# Patient Record
Sex: Female | Born: 1970 | Race: White | Hispanic: No | Marital: Married | Smoking: Never smoker
Health system: Southern US, Community
[De-identification: ages and names within clinical notes are randomized; demographics above are authoritative.]

## PROBLEM LIST (undated history)

## (undated) ENCOUNTER — Emergency Department (HOSPITAL_COMMUNITY): Admission: EM | Discharge status: Absent without leave | Payer: BC Managed Care – PPO

## (undated) DIAGNOSIS — M1990 Unspecified osteoarthritis, unspecified site: Secondary | ICD-10-CM

## (undated) DIAGNOSIS — C50919 Malignant neoplasm of unspecified site of unspecified female breast: Secondary | ICD-10-CM

## (undated) DIAGNOSIS — T4145XA Adverse effect of unspecified anesthetic, initial encounter: Secondary | ICD-10-CM

## (undated) DIAGNOSIS — T8859XA Other complications of anesthesia, initial encounter: Secondary | ICD-10-CM

## (undated) DIAGNOSIS — R000 Tachycardia, unspecified: Secondary | ICD-10-CM

## (undated) DIAGNOSIS — Z862 Personal history of diseases of the blood and blood-forming organs and certain disorders involving the immune mechanism: Secondary | ICD-10-CM

## (undated) DIAGNOSIS — Z923 Personal history of irradiation: Secondary | ICD-10-CM

## (undated) DIAGNOSIS — R159 Full incontinence of feces: Secondary | ICD-10-CM

## (undated) DIAGNOSIS — E039 Hypothyroidism, unspecified: Secondary | ICD-10-CM

## (undated) DIAGNOSIS — M069 Rheumatoid arthritis, unspecified: Secondary | ICD-10-CM

## (undated) DIAGNOSIS — Z9889 Other specified postprocedural states: Secondary | ICD-10-CM

## (undated) DIAGNOSIS — N993 Prolapse of vaginal vault after hysterectomy: Secondary | ICD-10-CM

## (undated) DIAGNOSIS — Z9221 Personal history of antineoplastic chemotherapy: Secondary | ICD-10-CM

## (undated) DIAGNOSIS — E890 Postprocedural hypothyroidism: Secondary | ICD-10-CM

## (undated) DIAGNOSIS — Z8711 Personal history of peptic ulcer disease: Secondary | ICD-10-CM

## (undated) DIAGNOSIS — I499 Cardiac arrhythmia, unspecified: Secondary | ICD-10-CM

## (undated) DIAGNOSIS — M2550 Pain in unspecified joint: Secondary | ICD-10-CM

## (undated) HISTORY — DX: Malignant neoplasm of unspecified site of unspecified female breast: C50.919

## (undated) HISTORY — PX: OTHER SURGICAL HISTORY: SHX169

## (undated) HISTORY — PX: ABDOMINAL HYSTERECTOMY: SHX81

## (undated) HISTORY — PX: OVUM / OOCYTE RETRIEVAL: SUR1269

---

## 1997-04-13 HISTORY — PX: EXCISION OF BREAST BIOPSY: SHX5822

## 1997-04-13 HISTORY — PX: OTHER SURGICAL HISTORY: SHX169

## 2006-04-13 HISTORY — PX: OTHER SURGICAL HISTORY: SHX169

## 2006-04-13 HISTORY — PX: APPENDECTOMY: SHX54

## 2006-08-12 HISTORY — PX: APPENDECTOMY: SHX54

## 2010-04-13 HISTORY — PX: THYROIDECTOMY, PARTIAL: SHX18

## 2010-05-14 HISTORY — PX: THYROID LOBECTOMY: SHX420

## 2011-06-03 HISTORY — PX: DILATION AND CURETTAGE OF UTERUS: SHX78

## 2012-04-15 DIAGNOSIS — M722 Plantar fascial fibromatosis: Secondary | ICD-10-CM | POA: Insufficient documentation

## 2015-04-02 DIAGNOSIS — J309 Allergic rhinitis, unspecified: Secondary | ICD-10-CM | POA: Insufficient documentation

## 2016-01-07 DIAGNOSIS — E782 Mixed hyperlipidemia: Secondary | ICD-10-CM | POA: Insufficient documentation

## 2016-01-07 DIAGNOSIS — E039 Hypothyroidism, unspecified: Secondary | ICD-10-CM | POA: Insufficient documentation

## 2016-06-12 ENCOUNTER — Encounter (INDEPENDENT_AMBULATORY_CARE_PROVIDER_SITE_OTHER): Payer: Self-pay

## 2016-06-12 ENCOUNTER — Encounter (INDEPENDENT_AMBULATORY_CARE_PROVIDER_SITE_OTHER): Payer: Self-pay | Admitting: *Deleted

## 2016-06-12 ENCOUNTER — Telehealth (INDEPENDENT_AMBULATORY_CARE_PROVIDER_SITE_OTHER): Payer: Self-pay | Admitting: *Deleted

## 2016-06-12 ENCOUNTER — Other Ambulatory Visit (INDEPENDENT_AMBULATORY_CARE_PROVIDER_SITE_OTHER): Payer: Self-pay | Admitting: *Deleted

## 2016-06-12 DIAGNOSIS — R194 Change in bowel habit: Secondary | ICD-10-CM | POA: Insufficient documentation

## 2016-06-12 MED ORDER — PEG 3350-KCL-NA BICARB-NACL 420 G PO SOLR: 4000.0000 mL | Freq: Once | ORAL | 0 refills | Status: DC

## 2016-06-12 NOTE — Telephone Encounter (Signed)
Referring MD/PCP: daniel   Procedure: tcs  Reason/Indication:  Change in stool  Has patient had this procedure before?  no  If so, when, by whom and where?    Is there a family history of colon cancer?  no  Who?  What age when diagnosed?    Is patient diabetic?   no      Does patient have prosthetic heart valve or mechanical valve?  no  Do you have a pacemaker?  no  Has patient ever had endocarditis? no  Has patient had joint replacement within last 12 months?  no  Does patient tend to be constipated or take laxatives? yes  Does patient have a history of alcohol/drug use?  no  Is patient on Coumadin, Plavix and/or Aspirin? no  Medications: zyrtec 10 mg daily, bystolic 5 mg daily, synthroid 50 mcg daily, mobic 15 mg daily  Allergies: codeine, hydrocodone  Medication Adjustment per Dr Laural Golden:   Procedure date & time: 06/15/16

## 2016-06-12 NOTE — Telephone Encounter (Signed)
Error   This encounter was created in error - please disregard. 

## 2016-06-12 NOTE — Telephone Encounter (Signed)
Open in error

## 2016-06-15 ENCOUNTER — Ambulatory Visit (HOSPITAL_COMMUNITY)
Admission: RE | Admit: 2016-06-15 | Discharge: 2016-06-15 | Disposition: A | Discharge status: Home / Self Care | Payer: BLUE CROSS/BLUE SHIELD | Source: Ambulatory Visit | Attending: Internal Medicine | Admitting: Internal Medicine

## 2016-06-15 ENCOUNTER — Encounter (HOSPITAL_COMMUNITY)
Admission: RE | Disposition: A | Discharge status: Home / Self Care | Payer: Self-pay | Source: Ambulatory Visit | Attending: Internal Medicine

## 2016-06-15 ENCOUNTER — Encounter (HOSPITAL_COMMUNITY): Payer: Self-pay | Admitting: *Deleted

## 2016-06-15 DIAGNOSIS — R194 Change in bowel habit: Secondary | ICD-10-CM | POA: Diagnosis not present

## 2016-06-15 DIAGNOSIS — K644 Residual hemorrhoidal skin tags: Secondary | ICD-10-CM | POA: Insufficient documentation

## 2016-06-15 DIAGNOSIS — Z79899 Other long term (current) drug therapy: Secondary | ICD-10-CM | POA: Insufficient documentation

## 2016-06-15 DIAGNOSIS — E039 Hypothyroidism, unspecified: Secondary | ICD-10-CM | POA: Insufficient documentation

## 2016-06-15 DIAGNOSIS — M1990 Unspecified osteoarthritis, unspecified site: Secondary | ICD-10-CM | POA: Diagnosis not present

## 2016-06-15 HISTORY — PX: COLONOSCOPY: SHX5424

## 2016-06-15 HISTORY — DX: Other complications of anesthesia, initial encounter: T88.59XA

## 2016-06-15 HISTORY — DX: Unspecified osteoarthritis, unspecified site: M19.90

## 2016-06-15 HISTORY — DX: Hypothyroidism, unspecified: E03.9

## 2016-06-15 HISTORY — DX: Cardiac arrhythmia, unspecified: I49.9

## 2016-06-15 HISTORY — DX: Adverse effect of unspecified anesthetic, initial encounter: T41.45XA

## 2016-06-15 SURGERY — COLONOSCOPY
Anesthesia: Moderate Sedation

## 2016-06-15 MED ORDER — MEPERIDINE HCL 50 MG/ML IJ SOLN
INTRAMUSCULAR | Status: AC
  Filled 2016-06-15: qty 1

## 2016-06-15 MED ORDER — MEPERIDINE HCL 50 MG/ML IJ SOLN
INTRAMUSCULAR | Status: DC | PRN
Start: 2016-06-15 — End: 2016-06-15
  Administered 2016-06-15 (×2): 25 mg via INTRAVENOUS

## 2016-06-15 MED ORDER — STERILE WATER FOR IRRIGATION IR SOLN
Status: DC | PRN
  Administered 2016-06-15: 09:00:00

## 2016-06-15 MED ORDER — SODIUM CHLORIDE 0.9 % IV SOLN
INTRAVENOUS | Status: DC
  Administered 2016-06-15: 08:00:00 via INTRAVENOUS

## 2016-06-15 MED ORDER — MIDAZOLAM HCL 5 MG/5ML IJ SOLN
INTRAMUSCULAR | Status: DC | PRN
  Administered 2016-06-15: 3 mg via INTRAVENOUS
  Administered 2016-06-15 (×2): 2 mg via INTRAVENOUS

## 2016-06-15 MED ORDER — MIDAZOLAM HCL 5 MG/5ML IJ SOLN
INTRAMUSCULAR | Status: AC
  Filled 2016-06-15: qty 10

## 2016-06-15 NOTE — Op Note (Signed)
[facility] Patient Name: Sandy Goodwin Procedure Date: 06/15/2016 8:13 AM MRN: [id number] Date of Birth: 1970/08/23 Attending MD: Hildred Laser , MD CSN: [id number] Age: 46 Admit Type: Outpatient Procedure:                Colonoscopy Indications:              Change in bowel habits Providers:                Hildred Laser, MD, Hinton Rao, RN, Aram Candela Referring MD:             Gar Ponto, MD Medicines:                Meperidine 50 mg IV, Midazolam 7 mg IV Complications:            No immediate complications. Estimated Blood Loss:     Estimated blood loss: none. Procedure:                Pre-Anesthesia Assessment:                           - Prior to the procedure, a History and Physical                            was performed, and patient medications and                            allergies were reviewed. The patient's tolerance of                            previous anesthesia was also reviewed. The risks                            and benefits of the procedure and the sedation                            options and risks were discussed with the patient.                            All questions were answered, and informed consent                            was obtained. Prior Anticoagulants: The patient                            last took previous NSAID medication 1 day prior to                            the procedure. ASA Grade Assessment: II - A patient                            with mild systemic disease. After reviewing the                            risks and benefits, the patient was deemed in  satisfactory condition to undergo the procedure.                           After obtaining informed consent, the colonoscope                            was passed under direct vision. Throughout the                            procedure, the patient's blood pressure, pulse, and                            oxygen saturations were monitored continuously.  The                            EC-[id number]) scope was introduced through                            the anus and advanced to the the cecum, identified                            by appendiceal orifice and ileocecal valve. The                            colonoscopy was performed without difficulty. The                            patient tolerated the procedure well. The quality                            of the bowel preparation was excellent. The                            ileocecal valve, appendiceal orifice, and rectum                            were photographed. Scope In: 8:49:17 AM Scope Out: 9:02:24 AM Scope Withdrawal Time: 0 hours 5 minutes 52 seconds  Total Procedure Duration: 0 hours 13 minutes 7 seconds  Findings:      The perianal and digital rectal examinations were normal.      The colon (entire examined portion) appeared normal.      External hemorrhoids were found during retroflexion. The hemorrhoids       were small. Impression:               - The entire examined colon is normal.                           - External hemorrhoids.                           - No specimens collected. Moderate Sedation:      Moderate (conscious) sedation was administered by the endoscopy nurse       and supervised by the endoscopist. The following parameters were       monitored:  oxygen saturation, heart rate, blood pressure, CO2       capnography and response to care. Total physician intraservice time was       17 minutes. Recommendation:           - Patient has a contact number available for                            emergencies. The signs and symptoms of potential                            delayed complications were discussed with the                            patient. Return to normal activities tomorrow.                            Written discharge instructions were provided to the                            patient.                           - High fiber diet today.                            - Continue present medications.                           - Repeat colonoscopy in 10 years for screening                            purposes.                           - Use original regular Metamucil one teaspoon PO                            daily.                           - CBC, serumalcium and TSH [facility]. Procedure Code(s):        --- Professional ---                           [phone], Colonoscopy, flexible; diagnostic, including                            collection of specimen(s) by brushing or washing,                            when performed (separate procedure)                           99152, Moderate sedation services  provided by the                            same physician or other qualified health care                            professional performing the diagnostic or                            therapeutic service that the sedation supports,                            requiring the presence of an independent trained                            observer to assist in the monitoring of the                            patient's level of consciousness and physiological                            status; initial 15 minutes of intraservice time,                            patient age 20 years or older Diagnosis Code(s):        --- Professional ---                           K64.4, Residual hemorrhoidal skin tags                           R19.4, Change in bowel habit CPT copyright 2016 American [employer] Association. All rights reserved. The codes documented in this report are preliminary and upon coder review may  be revised to meet current compliance requirements. Hildred Laser, MD Hildred Laser, MD 06/15/2016 9:17:33 AM This report has been signed electronically. Number of Addenda: 0

## 2016-06-15 NOTE — Discharge Instructions (Signed)
Resume usual medications and high fiber diet. Metamucil 3-4 g by mouth daily at bedtime. Please get CBC serum electrolytes and TSH checked. No driving for 24 hours. Next colonoscopy in 10 years.   Colonoscopy, Adult, Care After This sheet gives you information about how to care for yourself after your procedure. Your health care provider may also give you more specific instructions. If you have problems or questions, contact your health care provider. Dr Laural GoldenMU:[phone].  (after hours call hospital and have GI doctor on call paged) What can I expect after the procedure? After the procedure, it is common to have:  A small amount of blood in your stool for 24 hours after the procedure.  Some gas.  Mild abdominal cramping or bloating. Follow these instructions at home: General instructions    For the first 24 hours after the procedure:  Do not drive or use machinery.  Do not sign important documents.  Do not drink alcohol.  Do your regular daily activities at a slower pace than normal.  Eat soft, easy-to-digest foods.  Rest often.  Take over-the-counter or prescription medicines only as told by your health care provider.  It is up to you to get the results of your procedure. Ask your health care provider, or the department performing the procedure, when your results will be ready. Relieving cramping and bloating   Try walking around when you have cramps or feel bloated. Eating and drinking   Drink enough fluid to keep your urine clear or pale yellow.  Resume your normal diet as instructed by your health care provider. Avoid heavy or fried foods that are hard to digest.  Avoid drinking alcohol for as long as instructed by your health care provider. Contact a health care provider if:  You have blood in your stool 2-3 days after the procedure. Get help right away if:  You have more than a small spotting of blood in your stool.  You pass large blood clots in your  stool.  Your abdomen is swollen.  You have nausea or vomiting.  You have a fever.  You have increasing abdominal pain that is not relieved with medicine. This information is not intended to replace advice given to you by your health care provider. Make sure you discuss any questions you have with your health care provider. Document Released: 11/12/2003 Document Revised: 12/23/2015 Document Reviewed: 06/11/2015 Elsevier Interactive Patient Education  2017 [employer].

## 2016-06-15 NOTE — H&P (Signed)
Sandy Goodwin is an 46 y.o. female.   Chief Complaint:  Patient is here for colonoscopy. HPI:  Patient is 46 year old Sandy Goodwin female who persents with 2 month history of change in her bowel habits. She has noted her stools to be like ribbon. Her bowels would not move that she takes MiraLAX. She denies abdominal pain melena or rectal bleeding. She has not changed her eating habits. She eats a lot of fiber foods. She has not lost any weight. She had normal TSH in September 2017. Family history is negative for CRC or IBD.  Past Medical History:  Diagnosis Date  . Arthritis   . Complication of anesthesia    Itching  . Dysrhythmia    Tachycardia  . Hypothyroidism     Past Surgical History:  Procedure Laterality Date  . APPENDECTOMY  2008  . Bowel Rupture Repair  2008   Ruptured during appendectomy  . Breast Biopsy Right 1999  . IBF  [id number]  . THYROIDECTOMY, PARTIAL Bilateral 2012    Family History  Problem Relation Age of Onset  . Hypertension Mother   . Breast cancer Mother   . Lymphoma Mother   . Hypertension Brother    Social History:  reports that she has never smoked. She has never used smokeless tobacco. She reports that she does not drink alcohol or use drugs.  Allergies:  Allergies  Allergen Reactions  . Codeine Other (See Comments)    Makes pt agittated and stay awake   . Hydrocodone Other (See Comments)    Makes pt agittated and stay awake     Medications Prior to Admission  Medication Sig Dispense Refill  . BYSTOLIC 5 MG tablet Take 1 tablet by mouth daily.  1  . cetirizine (ZYRTEC) 10 MG tablet Take 10 mg by mouth daily.    Marland Kitchen levothyroxine (SYNTHROID, LEVOTHROID) 50 MCG tablet Take 50 mcg by mouth daily before breakfast.    . meloxicam (MOBIC) 15 MG tablet Take 15 mg by mouth daily.      No results found for this or any previous visit (from the past 48 hour(s)). No results found.  ROS  Blood pressure 113/77, pulse 63, temperature 98.2 F (36.8 C),  temperature source Oral, resp. rate 20, height 5' 6.5" (1.689 m), weight 185 lb (83.9 kg), SpO2 100 %. Physical Exam  Constitutional: She appears well-developed and well-nourished.  HENT:  Mouth/Throat: Oropharynx is clear and moist.  Eyes: Conjunctivae are normal. No scleral icterus.  Neck: No thyromegaly present.  Cardiovascular: Normal rate, regular rhythm and normal heart sounds.   No murmur heard. Respiratory: Effort normal and breath sounds normal.  GI: Soft. She exhibits no distension and no mass. There is no tenderness.  Musculoskeletal: She exhibits no edema.  Lymphadenopathy:    She has no cervical adenopathy.  Neurological: She is alert.  Skin: Skin is warm and dry.     Assessment/Plan Change in bowel habits. Diagnostic colonoscopy.  Hildred Laser, MD 06/15/2016, 8:37 AM

## 2016-06-17 ENCOUNTER — Encounter (HOSPITAL_COMMUNITY): Payer: Self-pay | Admitting: Internal Medicine

## 2016-06-26 DIAGNOSIS — M1280 Other specific arthropathies, not elsewhere classified, unspecified site: Secondary | ICD-10-CM | POA: Diagnosis not present

## 2016-06-26 DIAGNOSIS — Z79899 Other long term (current) drug therapy: Secondary | ICD-10-CM | POA: Diagnosis not present

## 2016-06-26 DIAGNOSIS — E039 Hypothyroidism, unspecified: Secondary | ICD-10-CM | POA: Diagnosis not present

## 2017-01-05 DIAGNOSIS — E782 Mixed hyperlipidemia: Secondary | ICD-10-CM | POA: Diagnosis not present

## 2017-01-05 DIAGNOSIS — Z79899 Other long term (current) drug therapy: Secondary | ICD-10-CM | POA: Diagnosis not present

## 2017-01-05 DIAGNOSIS — E039 Hypothyroidism, unspecified: Secondary | ICD-10-CM | POA: Diagnosis not present

## 2017-01-05 DIAGNOSIS — Z0000 Encounter for general adult medical examination without abnormal findings: Secondary | ICD-10-CM | POA: Diagnosis not present

## 2017-01-05 DIAGNOSIS — R5383 Other fatigue: Secondary | ICD-10-CM | POA: Diagnosis not present

## 2017-01-07 DIAGNOSIS — Z23 Encounter for immunization: Secondary | ICD-10-CM | POA: Diagnosis not present

## 2017-01-07 DIAGNOSIS — L814 Other melanin hyperpigmentation: Secondary | ICD-10-CM | POA: Diagnosis not present

## 2017-01-07 DIAGNOSIS — L853 Xerosis cutis: Secondary | ICD-10-CM | POA: Diagnosis not present

## 2017-01-07 DIAGNOSIS — D225 Melanocytic nevi of trunk: Secondary | ICD-10-CM | POA: Diagnosis not present

## 2017-01-07 DIAGNOSIS — L218 Other seborrheic dermatitis: Secondary | ICD-10-CM | POA: Diagnosis not present

## 2017-01-25 DIAGNOSIS — Z01419 Encounter for gynecological examination (general) (routine) without abnormal findings: Secondary | ICD-10-CM | POA: Diagnosis not present

## 2017-01-25 DIAGNOSIS — Z6831 Body mass index (BMI) 31.0-31.9, adult: Secondary | ICD-10-CM | POA: Diagnosis not present

## 2017-02-19 DIAGNOSIS — H5213 Myopia, bilateral: Secondary | ICD-10-CM | POA: Diagnosis not present

## 2017-03-15 DIAGNOSIS — M25551 Pain in right hip: Secondary | ICD-10-CM | POA: Diagnosis not present

## 2017-03-15 DIAGNOSIS — Z79899 Other long term (current) drug therapy: Secondary | ICD-10-CM | POA: Diagnosis not present

## 2017-03-15 DIAGNOSIS — M2550 Pain in unspecified joint: Secondary | ICD-10-CM | POA: Diagnosis not present

## 2017-03-15 DIAGNOSIS — R768 Other specified abnormal immunological findings in serum: Secondary | ICD-10-CM | POA: Diagnosis not present

## 2017-05-03 DIAGNOSIS — Z1231 Encounter for screening mammogram for malignant neoplasm of breast: Secondary | ICD-10-CM | POA: Diagnosis not present

## 2017-11-23 DIAGNOSIS — R300 Dysuria: Secondary | ICD-10-CM | POA: Diagnosis not present

## 2018-01-08 DIAGNOSIS — Z0000 Encounter for general adult medical examination without abnormal findings: Secondary | ICD-10-CM | POA: Diagnosis not present

## 2018-01-08 DIAGNOSIS — Z23 Encounter for immunization: Secondary | ICD-10-CM | POA: Diagnosis not present

## 2018-01-08 DIAGNOSIS — E782 Mixed hyperlipidemia: Secondary | ICD-10-CM | POA: Diagnosis not present

## 2018-01-08 DIAGNOSIS — E039 Hypothyroidism, unspecified: Secondary | ICD-10-CM | POA: Diagnosis not present

## 2018-02-28 DIAGNOSIS — H5213 Myopia, bilateral: Secondary | ICD-10-CM | POA: Diagnosis not present

## 2018-03-03 DIAGNOSIS — Z6831 Body mass index (BMI) 31.0-31.9, adult: Secondary | ICD-10-CM | POA: Diagnosis not present

## 2018-03-03 DIAGNOSIS — Z01419 Encounter for gynecological examination (general) (routine) without abnormal findings: Secondary | ICD-10-CM | POA: Diagnosis not present

## 2018-03-17 DIAGNOSIS — R768 Other specified abnormal immunological findings in serum: Secondary | ICD-10-CM | POA: Diagnosis not present

## 2018-03-17 DIAGNOSIS — M25551 Pain in right hip: Secondary | ICD-10-CM | POA: Diagnosis not present

## 2018-03-17 DIAGNOSIS — M2550 Pain in unspecified joint: Secondary | ICD-10-CM | POA: Diagnosis not present

## 2018-03-17 DIAGNOSIS — Z79899 Other long term (current) drug therapy: Secondary | ICD-10-CM | POA: Diagnosis not present

## 2018-03-23 DIAGNOSIS — Z8041 Family history of malignant neoplasm of ovary: Secondary | ICD-10-CM | POA: Diagnosis not present

## 2018-03-23 DIAGNOSIS — Z803 Family history of malignant neoplasm of breast: Secondary | ICD-10-CM | POA: Diagnosis not present

## 2018-04-20 DIAGNOSIS — Z1239 Encounter for other screening for malignant neoplasm of breast: Secondary | ICD-10-CM | POA: Diagnosis not present

## 2018-05-02 DIAGNOSIS — D225 Melanocytic nevi of trunk: Secondary | ICD-10-CM | POA: Diagnosis not present

## 2018-05-02 DIAGNOSIS — L853 Xerosis cutis: Secondary | ICD-10-CM | POA: Diagnosis not present

## 2018-05-02 DIAGNOSIS — D1801 Hemangioma of skin and subcutaneous tissue: Secondary | ICD-10-CM | POA: Diagnosis not present

## 2018-05-02 DIAGNOSIS — Z872 Personal history of diseases of the skin and subcutaneous tissue: Secondary | ICD-10-CM | POA: Diagnosis not present

## 2018-05-05 DIAGNOSIS — Z1231 Encounter for screening mammogram for malignant neoplasm of breast: Secondary | ICD-10-CM | POA: Diagnosis not present

## 2018-09-29 DIAGNOSIS — L280 Lichen simplex chronicus: Secondary | ICD-10-CM | POA: Diagnosis not present

## 2018-09-29 DIAGNOSIS — Z6831 Body mass index (BMI) 31.0-31.9, adult: Secondary | ICD-10-CM | POA: Diagnosis not present

## 2018-12-22 DIAGNOSIS — Z23 Encounter for immunization: Secondary | ICD-10-CM | POA: Diagnosis not present

## 2019-01-11 DIAGNOSIS — E039 Hypothyroidism, unspecified: Secondary | ICD-10-CM | POA: Diagnosis not present

## 2019-01-11 DIAGNOSIS — E782 Mixed hyperlipidemia: Secondary | ICD-10-CM | POA: Diagnosis not present

## 2019-01-11 DIAGNOSIS — R739 Hyperglycemia, unspecified: Secondary | ICD-10-CM | POA: Diagnosis not present

## 2019-01-11 DIAGNOSIS — R5383 Other fatigue: Secondary | ICD-10-CM | POA: Diagnosis not present

## 2019-02-02 DIAGNOSIS — N814 Uterovaginal prolapse, unspecified: Secondary | ICD-10-CM | POA: Diagnosis not present

## 2019-02-02 DIAGNOSIS — Z01419 Encounter for gynecological examination (general) (routine) without abnormal findings: Secondary | ICD-10-CM | POA: Diagnosis not present

## 2019-03-17 DIAGNOSIS — Z79899 Other long term (current) drug therapy: Secondary | ICD-10-CM | POA: Diagnosis not present

## 2019-03-17 DIAGNOSIS — E039 Hypothyroidism, unspecified: Secondary | ICD-10-CM | POA: Diagnosis not present

## 2019-03-17 DIAGNOSIS — N814 Uterovaginal prolapse, unspecified: Secondary | ICD-10-CM | POA: Diagnosis not present

## 2019-03-17 DIAGNOSIS — Z975 Presence of (intrauterine) contraceptive device: Secondary | ICD-10-CM | POA: Diagnosis not present

## 2019-03-17 DIAGNOSIS — E669 Obesity, unspecified: Secondary | ICD-10-CM | POA: Diagnosis not present

## 2019-03-17 DIAGNOSIS — Z01818 Encounter for other preprocedural examination: Secondary | ICD-10-CM | POA: Diagnosis not present

## 2019-03-17 DIAGNOSIS — Z886 Allergy status to analgesic agent status: Secondary | ICD-10-CM | POA: Diagnosis not present

## 2019-03-17 DIAGNOSIS — D259 Leiomyoma of uterus, unspecified: Secondary | ICD-10-CM | POA: Diagnosis not present

## 2019-03-17 DIAGNOSIS — G609 Hereditary and idiopathic neuropathy, unspecified: Secondary | ICD-10-CM | POA: Diagnosis not present

## 2019-03-17 DIAGNOSIS — R102 Pelvic and perineal pain: Secondary | ICD-10-CM | POA: Diagnosis not present

## 2019-03-17 DIAGNOSIS — Z6830 Body mass index (BMI) 30.0-30.9, adult: Secondary | ICD-10-CM | POA: Diagnosis not present

## 2019-03-21 DIAGNOSIS — N814 Uterovaginal prolapse, unspecified: Secondary | ICD-10-CM | POA: Diagnosis not present

## 2019-03-21 DIAGNOSIS — D259 Leiomyoma of uterus, unspecified: Secondary | ICD-10-CM | POA: Diagnosis not present

## 2019-03-21 DIAGNOSIS — Z975 Presence of (intrauterine) contraceptive device: Secondary | ICD-10-CM | POA: Diagnosis not present

## 2019-03-21 DIAGNOSIS — G609 Hereditary and idiopathic neuropathy, unspecified: Secondary | ICD-10-CM | POA: Diagnosis not present

## 2019-03-21 DIAGNOSIS — Z6830 Body mass index (BMI) 30.0-30.9, adult: Secondary | ICD-10-CM | POA: Diagnosis not present

## 2019-03-21 DIAGNOSIS — E669 Obesity, unspecified: Secondary | ICD-10-CM | POA: Diagnosis not present

## 2019-03-21 DIAGNOSIS — Z79899 Other long term (current) drug therapy: Secondary | ICD-10-CM | POA: Diagnosis not present

## 2019-03-21 DIAGNOSIS — Z886 Allergy status to analgesic agent status: Secondary | ICD-10-CM | POA: Diagnosis not present

## 2019-03-21 DIAGNOSIS — E039 Hypothyroidism, unspecified: Secondary | ICD-10-CM | POA: Diagnosis not present

## 2019-03-21 DIAGNOSIS — R102 Pelvic and perineal pain: Secondary | ICD-10-CM | POA: Diagnosis not present

## 2019-03-21 HISTORY — PX: VAGINAL HYSTERECTOMY: SUR661

## 2019-03-22 DIAGNOSIS — N814 Uterovaginal prolapse, unspecified: Secondary | ICD-10-CM | POA: Diagnosis not present

## 2019-03-22 DIAGNOSIS — E039 Hypothyroidism, unspecified: Secondary | ICD-10-CM | POA: Diagnosis not present

## 2019-03-22 DIAGNOSIS — E669 Obesity, unspecified: Secondary | ICD-10-CM | POA: Diagnosis not present

## 2019-03-22 DIAGNOSIS — Z79899 Other long term (current) drug therapy: Secondary | ICD-10-CM | POA: Diagnosis not present

## 2019-03-22 DIAGNOSIS — Z975 Presence of (intrauterine) contraceptive device: Secondary | ICD-10-CM | POA: Diagnosis not present

## 2019-03-22 DIAGNOSIS — Z886 Allergy status to analgesic agent status: Secondary | ICD-10-CM | POA: Diagnosis not present

## 2019-03-22 DIAGNOSIS — G609 Hereditary and idiopathic neuropathy, unspecified: Secondary | ICD-10-CM | POA: Diagnosis not present

## 2019-03-22 DIAGNOSIS — Z6830 Body mass index (BMI) 30.0-30.9, adult: Secondary | ICD-10-CM | POA: Diagnosis not present

## 2019-03-22 DIAGNOSIS — D259 Leiomyoma of uterus, unspecified: Secondary | ICD-10-CM | POA: Diagnosis not present

## 2019-03-22 DIAGNOSIS — R102 Pelvic and perineal pain: Secondary | ICD-10-CM | POA: Diagnosis not present

## 2019-04-19 ENCOUNTER — Other Ambulatory Visit: Payer: Self-pay | Admitting: Unknown Physician Specialty

## 2019-04-19 DIAGNOSIS — Z803 Family history of malignant neoplasm of breast: Secondary | ICD-10-CM

## 2019-04-21 DIAGNOSIS — Z23 Encounter for immunization: Secondary | ICD-10-CM | POA: Diagnosis not present

## 2019-04-27 DIAGNOSIS — R300 Dysuria: Secondary | ICD-10-CM | POA: Diagnosis not present

## 2019-05-01 DIAGNOSIS — L718 Other rosacea: Secondary | ICD-10-CM | POA: Diagnosis not present

## 2019-05-01 DIAGNOSIS — D485 Neoplasm of uncertain behavior of skin: Secondary | ICD-10-CM | POA: Diagnosis not present

## 2019-05-01 DIAGNOSIS — D225 Melanocytic nevi of trunk: Secondary | ICD-10-CM | POA: Diagnosis not present

## 2019-05-01 DIAGNOSIS — L814 Other melanin hyperpigmentation: Secondary | ICD-10-CM | POA: Diagnosis not present

## 2019-05-01 DIAGNOSIS — D1801 Hemangioma of skin and subcutaneous tissue: Secondary | ICD-10-CM | POA: Diagnosis not present

## 2019-05-01 DIAGNOSIS — L905 Scar conditions and fibrosis of skin: Secondary | ICD-10-CM | POA: Diagnosis not present

## 2019-05-08 DIAGNOSIS — R768 Other specified abnormal immunological findings in serum: Secondary | ICD-10-CM | POA: Diagnosis not present

## 2019-05-08 DIAGNOSIS — Z79899 Other long term (current) drug therapy: Secondary | ICD-10-CM | POA: Diagnosis not present

## 2019-05-08 DIAGNOSIS — M2550 Pain in unspecified joint: Secondary | ICD-10-CM | POA: Diagnosis not present

## 2019-05-08 DIAGNOSIS — M25551 Pain in right hip: Secondary | ICD-10-CM | POA: Diagnosis not present

## 2019-05-10 DIAGNOSIS — Z1231 Encounter for screening mammogram for malignant neoplasm of breast: Secondary | ICD-10-CM | POA: Diagnosis not present

## 2019-05-15 DIAGNOSIS — Z20828 Contact with and (suspected) exposure to other viral communicable diseases: Secondary | ICD-10-CM | POA: Diagnosis not present

## 2019-05-17 DIAGNOSIS — N6001 Solitary cyst of right breast: Secondary | ICD-10-CM | POA: Diagnosis not present

## 2019-05-17 DIAGNOSIS — Z803 Family history of malignant neoplasm of breast: Secondary | ICD-10-CM | POA: Diagnosis not present

## 2019-05-17 DIAGNOSIS — N6315 Unspecified lump in the right breast, overlapping quadrants: Secondary | ICD-10-CM | POA: Diagnosis not present

## 2019-05-17 DIAGNOSIS — R922 Inconclusive mammogram: Secondary | ICD-10-CM | POA: Diagnosis not present

## 2019-05-22 DIAGNOSIS — Z23 Encounter for immunization: Secondary | ICD-10-CM | POA: Diagnosis not present

## 2019-05-25 DIAGNOSIS — H5213 Myopia, bilateral: Secondary | ICD-10-CM | POA: Diagnosis not present

## 2019-05-29 ENCOUNTER — Other Ambulatory Visit: Payer: Self-pay

## 2019-05-29 ENCOUNTER — Ambulatory Visit
Admission: RE | Admit: 2019-05-29 | Discharge: 2019-05-29 | Disposition: A | Discharge status: Home / Self Care | Payer: BLUE CROSS/BLUE SHIELD | Source: Ambulatory Visit | Attending: Unknown Physician Specialty | Admitting: Unknown Physician Specialty

## 2019-05-29 DIAGNOSIS — Z803 Family history of malignant neoplasm of breast: Secondary | ICD-10-CM

## 2019-05-29 IMAGING — MR MR BREAST BILAT WO/W CM
8 of 12 series · 33 of 48 positions shown · IV contrast (gadavist)
Comparison: Previous exam(s).

CLINICAL DATA: 48-year-old female presenting for high risk
screening MRI. Family history of breast cancer in mother, maternal
aunt and maternal great grandmother.

LABS:  None performed on site.
EXAM:
BILATERAL BREAST MRI WITH AND WITHOUT CONTRAST
TECHNIQUE: Multiplanar, multisequence MR images of both breasts were obtained
prior to and following the intravenous administration of 8 ml of
Gadavist.

[Series 2: t2_tirm_tra ipat (a-p) · axial · 3.0mm · 0.74mm/px · 1 of 54 slices shown]
[im 1/54]
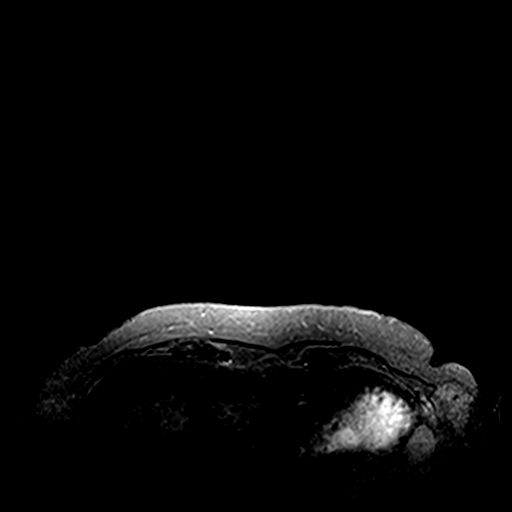

[Series 4: fl3d pre-cm · axial · non-contrast · 1.2mm · 0.99mm/px · z∈[-86,+85]mm · 5 of 144 slices shown]
[im 1/144]
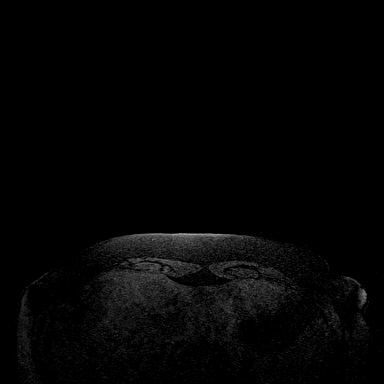
[im 36/144]
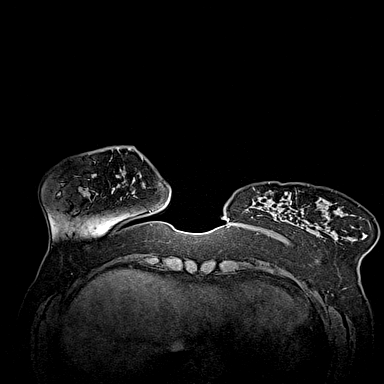
[im 72/144]
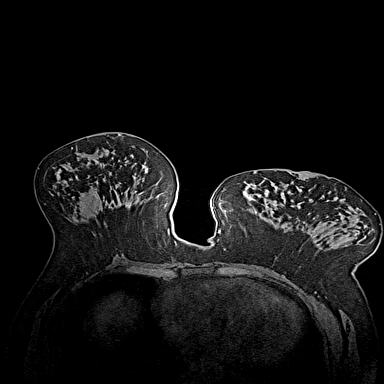
[im 108/144]
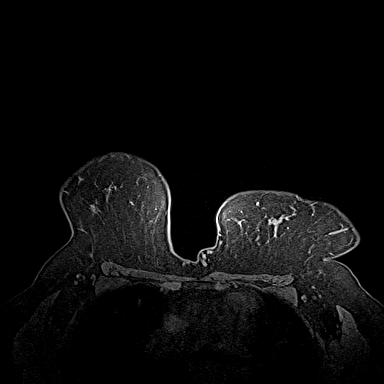
[im 144/144]
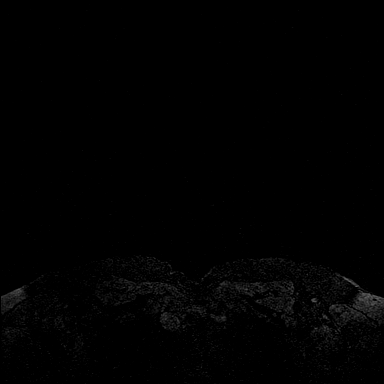

[Series 5: fl3d pre-cm no · axial · non-contrast · 1.2mm · 1.04mm/px · z∈[-86,+85]mm · 5 of 144 slices shown]
[im 1/144]
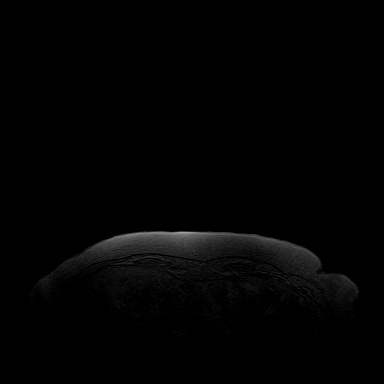
[im 36/144]
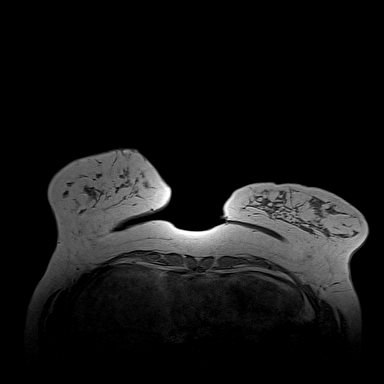
[im 72/144]
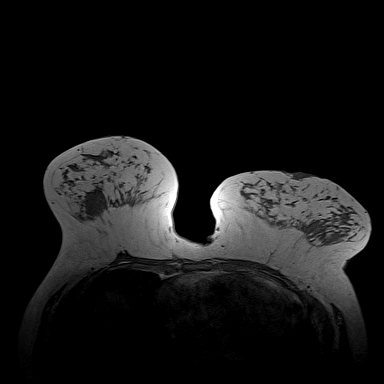
[im 108/144]
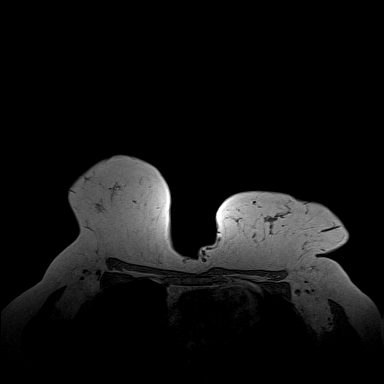
[im 144/144]
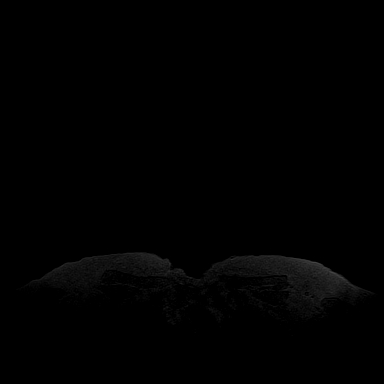

[Series 6: fl3d post-cm 20 · axial · 1.2mm · 0.99mm/px · z∈[-86,+85]mm · 5 of 144 slices shown (1 of 3)]
[im 1/144]
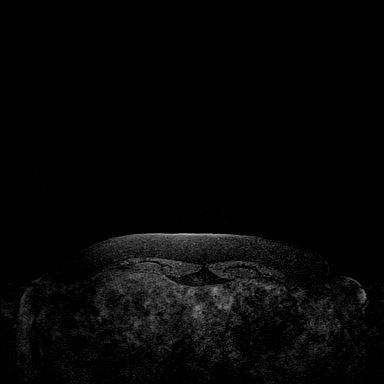
[im 36/144]
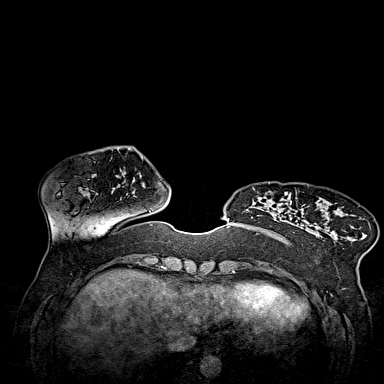
[im 72/144]
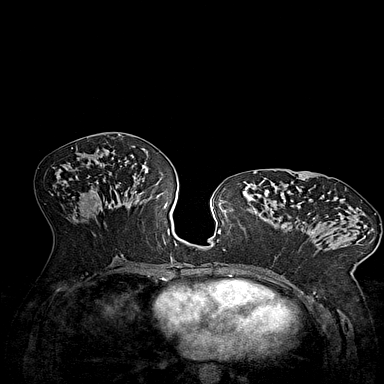
[im 108/144]
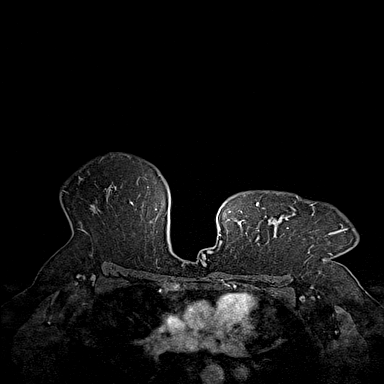
[im 144/144]
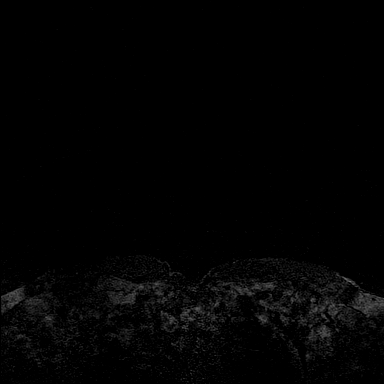

[Series 7: fl3d post-cm 20 · axial · 1.2mm · 0.99mm/px · z∈[-86,+85]mm · 5 of 144 slices shown (2 of 3)]
[im 1/144]
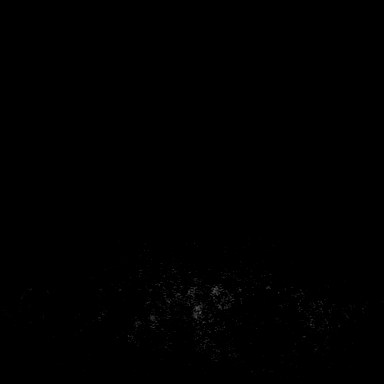
[im 36/144]
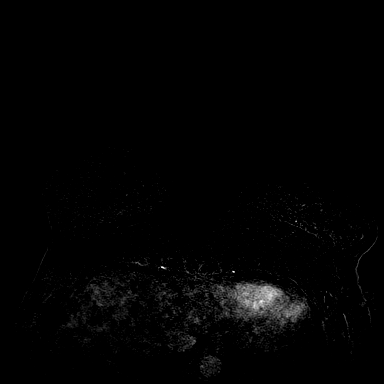
[im 72/144]
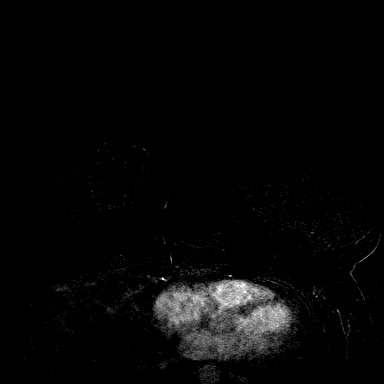
[im 108/144]
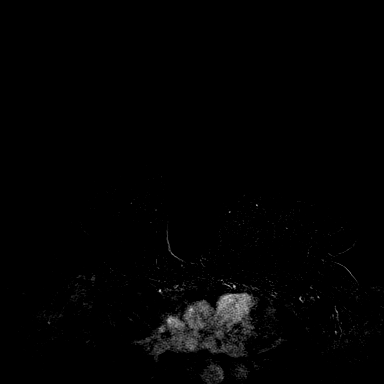
[im 144/144]
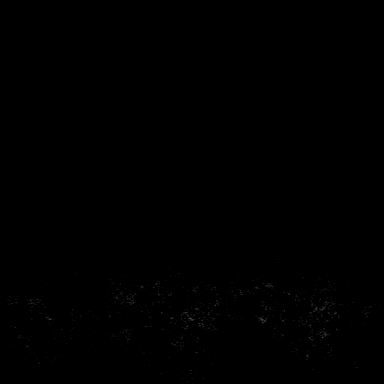

[Series 8: fl3d post-cm 20 · axial · 172.8mm · 0.99mm/px · 1 of 1 slices shown (3 of 3)]
[im 1/1]
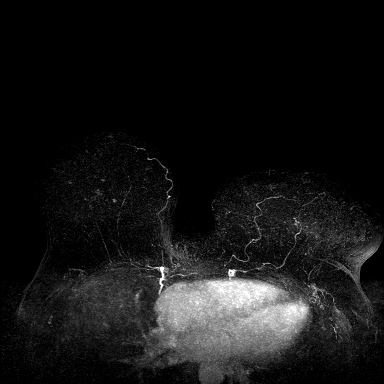

[Series 9: fl3d post-cm 3min · axial · 1.2mm · 0.99mm/px · z∈[-86,+85]mm · 6 of 144 slices shown]
[im 1/144]
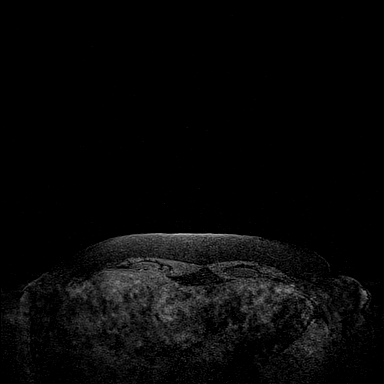
[im 29/144]
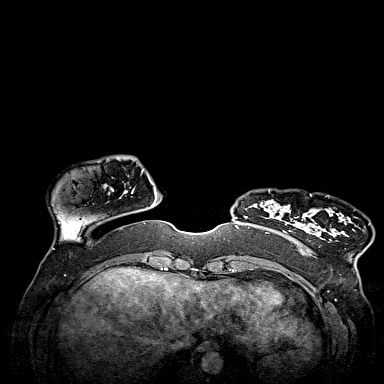
[im 58/144]
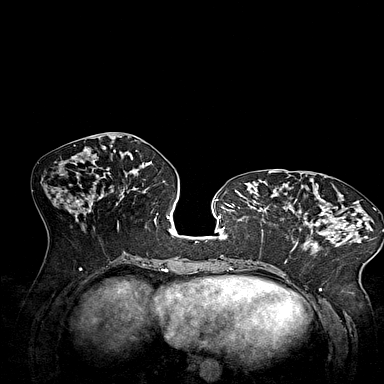
[im 86/144]
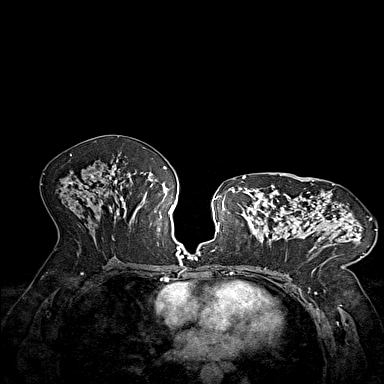
[im 115/144]
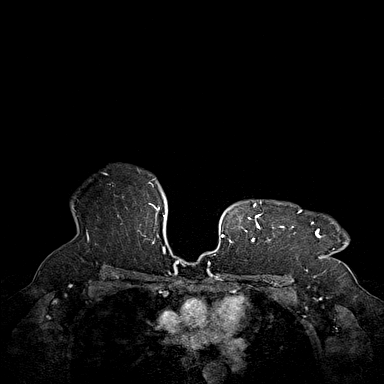
[im 144/144]
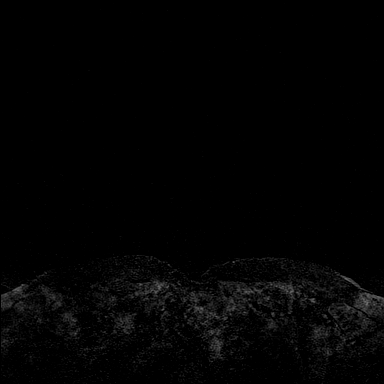

[Series 10: fl3d post-cm 3min_sub · axial · 1.2mm · 0.99mm/px · z∈[-86,+51]mm · 5 of 144 slices shown]
[im 1/144]
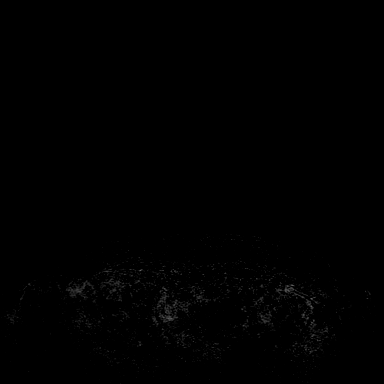
[im 29/144]
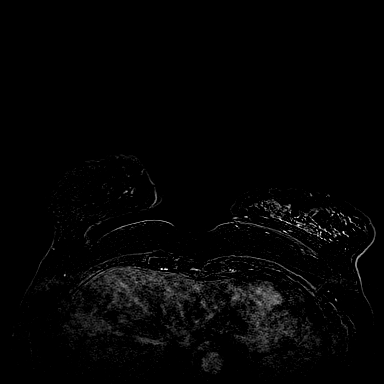
[im 58/144]
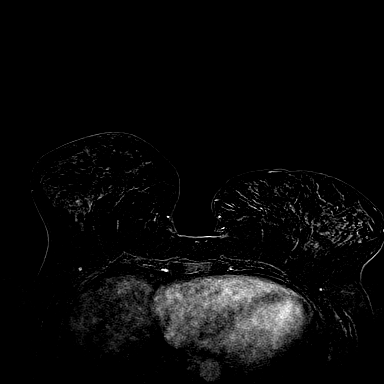
[im 86/144]
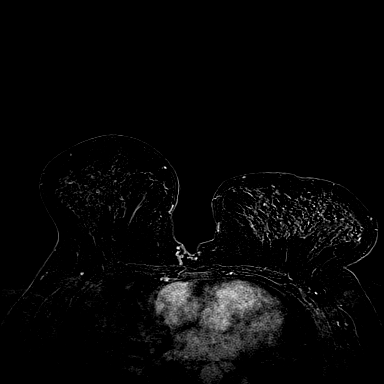
[im 115/144]
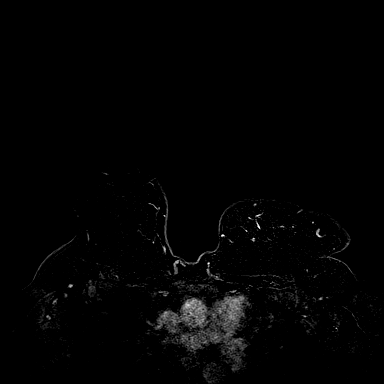

[33 of 48 positions shown; findings below may reference images not displayed]

Three-dimensional MR images were rendered by post-processing of the
original MR data on an independent workstation. The
three-dimensional MR images were interpreted, and findings are
reported in the following complete MRI report for this study. Three
dimensional images were evaluated at the independent [employer]
workstation
FINDINGS: Breast composition: c. Heterogeneous fibroglandular tissue.

Background parenchymal enhancement: Mild.

Right breast: No dominant mass or suspicious enhancement.

Left breast: No dominant mass or suspicious enhancement.

Lymph nodes: No abnormal appearing lymph nodes.

Ancillary findings:  None.
IMPRESSION: No MRI evidence of malignancy in either breast.

RECOMMENDATION:
Routine annual screening with mammography and breast MRI. The
patient is due for her next screening mammogram in [DATE].

BI-RADS CATEGORY  1: Negative.

## 2019-05-29 MED ORDER — GADOBUTROL 1 MMOL/ML IV SOLN
8.0000 mL | Freq: Once | INTRAVENOUS | Status: AC | PRN
  Administered 2019-05-29: 8 mL via INTRAVENOUS

## 2019-07-17 DIAGNOSIS — N8112 Cystocele, lateral: Secondary | ICD-10-CM | POA: Diagnosis not present

## 2019-09-18 DIAGNOSIS — N8112 Cystocele, lateral: Secondary | ICD-10-CM | POA: Diagnosis not present

## 2019-12-05 DIAGNOSIS — R300 Dysuria: Secondary | ICD-10-CM | POA: Diagnosis not present

## 2019-12-06 DIAGNOSIS — R300 Dysuria: Secondary | ICD-10-CM | POA: Diagnosis not present

## 2019-12-25 DIAGNOSIS — Z23 Encounter for immunization: Secondary | ICD-10-CM | POA: Diagnosis not present

## 2020-01-01 DIAGNOSIS — L218 Other seborrheic dermatitis: Secondary | ICD-10-CM | POA: Diagnosis not present

## 2020-01-01 DIAGNOSIS — L905 Scar conditions and fibrosis of skin: Secondary | ICD-10-CM | POA: Diagnosis not present

## 2020-01-01 DIAGNOSIS — L718 Other rosacea: Secondary | ICD-10-CM | POA: Diagnosis not present

## 2020-01-02 DIAGNOSIS — E039 Hypothyroidism, unspecified: Secondary | ICD-10-CM | POA: Diagnosis not present

## 2020-01-02 DIAGNOSIS — R739 Hyperglycemia, unspecified: Secondary | ICD-10-CM | POA: Diagnosis not present

## 2020-01-02 DIAGNOSIS — Z0000 Encounter for general adult medical examination without abnormal findings: Secondary | ICD-10-CM | POA: Diagnosis not present

## 2020-01-02 DIAGNOSIS — E782 Mixed hyperlipidemia: Secondary | ICD-10-CM | POA: Diagnosis not present

## 2020-02-14 DIAGNOSIS — Z23 Encounter for immunization: Secondary | ICD-10-CM | POA: Diagnosis not present

## 2020-02-15 DIAGNOSIS — Z6831 Body mass index (BMI) 31.0-31.9, adult: Secondary | ICD-10-CM | POA: Diagnosis not present

## 2020-02-15 DIAGNOSIS — Z01419 Encounter for gynecological examination (general) (routine) without abnormal findings: Secondary | ICD-10-CM | POA: Diagnosis not present

## 2020-06-11 DIAGNOSIS — Z170 Estrogen receptor positive status [ER+]: Secondary | ICD-10-CM

## 2020-06-11 HISTORY — DX: Estrogen receptor positive status (ER+): Z17.0

## 2020-06-26 ENCOUNTER — Other Ambulatory Visit: Payer: Self-pay | Admitting: Family Medicine

## 2020-06-26 DIAGNOSIS — R921 Mammographic calcification found on diagnostic imaging of breast: Secondary | ICD-10-CM

## 2020-07-04 ENCOUNTER — Other Ambulatory Visit: Payer: Self-pay | Admitting: Unknown Physician Specialty

## 2020-07-05 ENCOUNTER — Other Ambulatory Visit: Payer: Self-pay | Admitting: Unknown Physician Specialty

## 2020-07-05 DIAGNOSIS — R921 Mammographic calcification found on diagnostic imaging of breast: Secondary | ICD-10-CM

## 2020-07-08 ENCOUNTER — Ambulatory Visit
Admission: RE | Admit: 2020-07-08 | Discharge: 2020-07-08 | Disposition: A | Discharge status: Home / Self Care | Payer: BC Managed Care – PPO | Source: Ambulatory Visit | Attending: Unknown Physician Specialty | Admitting: Unknown Physician Specialty

## 2020-07-08 ENCOUNTER — Ambulatory Visit
Admission: RE | Admit: 2020-07-08 | Discharge: 2020-07-08 | Disposition: A | Discharge status: Home / Self Care | Payer: BC Managed Care – PPO | Source: Ambulatory Visit | Attending: Family Medicine | Admitting: Family Medicine

## 2020-07-08 ENCOUNTER — Other Ambulatory Visit: Payer: Self-pay

## 2020-07-08 ENCOUNTER — Other Ambulatory Visit: Payer: Self-pay | Admitting: Unknown Physician Specialty

## 2020-07-08 DIAGNOSIS — R921 Mammographic calcification found on diagnostic imaging of breast: Secondary | ICD-10-CM

## 2020-07-08 IMAGING — MG MM BREAST LOCALIZATION CLIP
4 series · 4 of 12 positions shown · non-contrast
Comparison: Previous exam(s).

CLINICAL DATA: Evaluate post biopsy marker clip placement following
stereotactic core needle biopsy of the anterior and posterior
extents of right breast calcifications.

EXAM:
DIAGNOSTIC RIGHT MAMMOGRAM POST STEREOTACTIC BIOPSY

[R CC synth-2D]
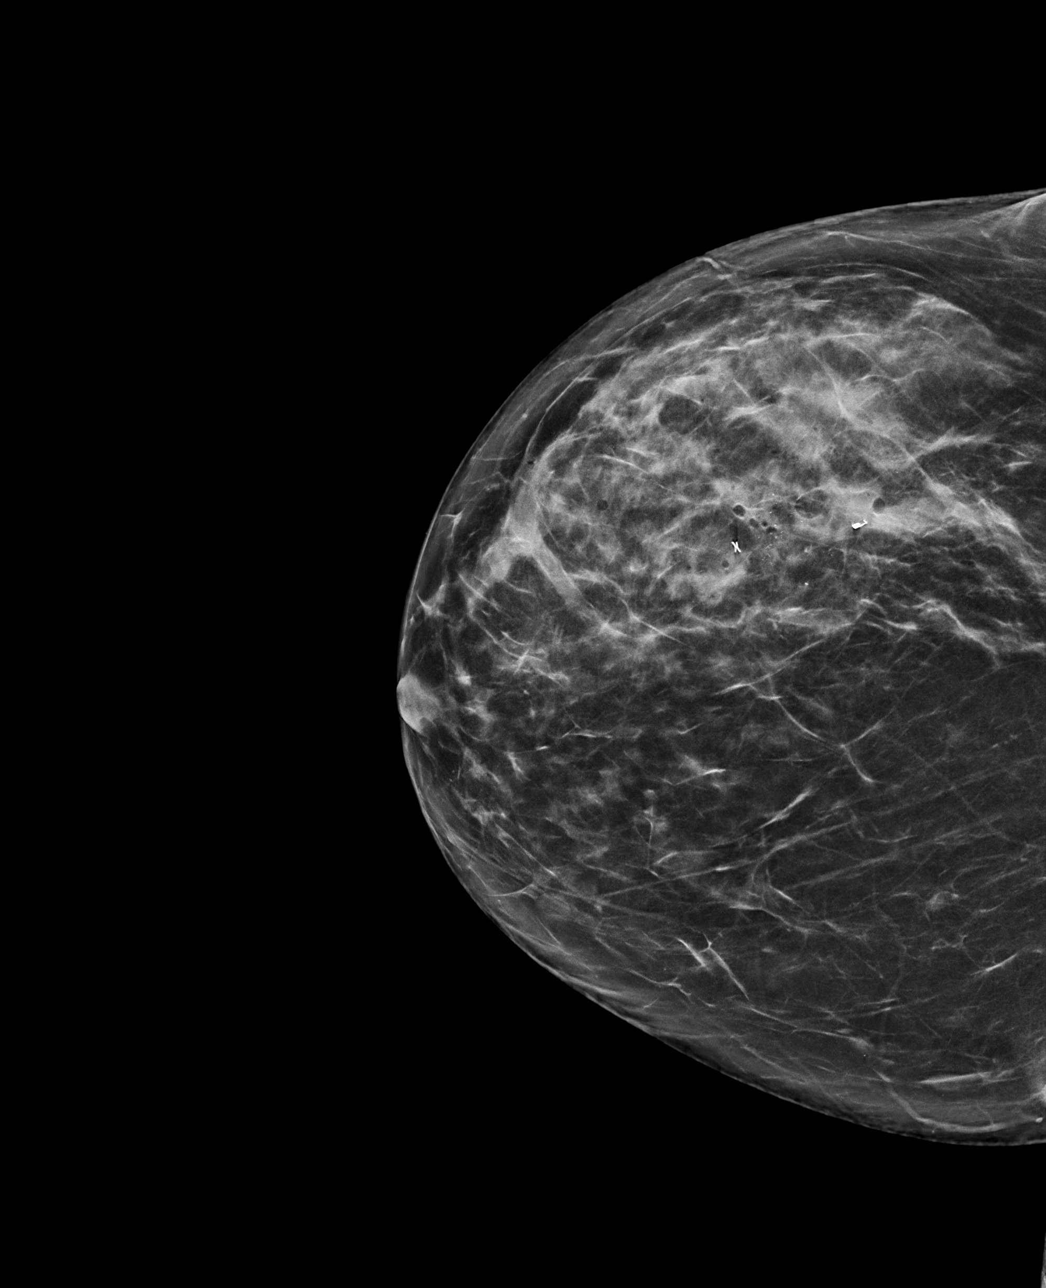

[R ML synth-2D]
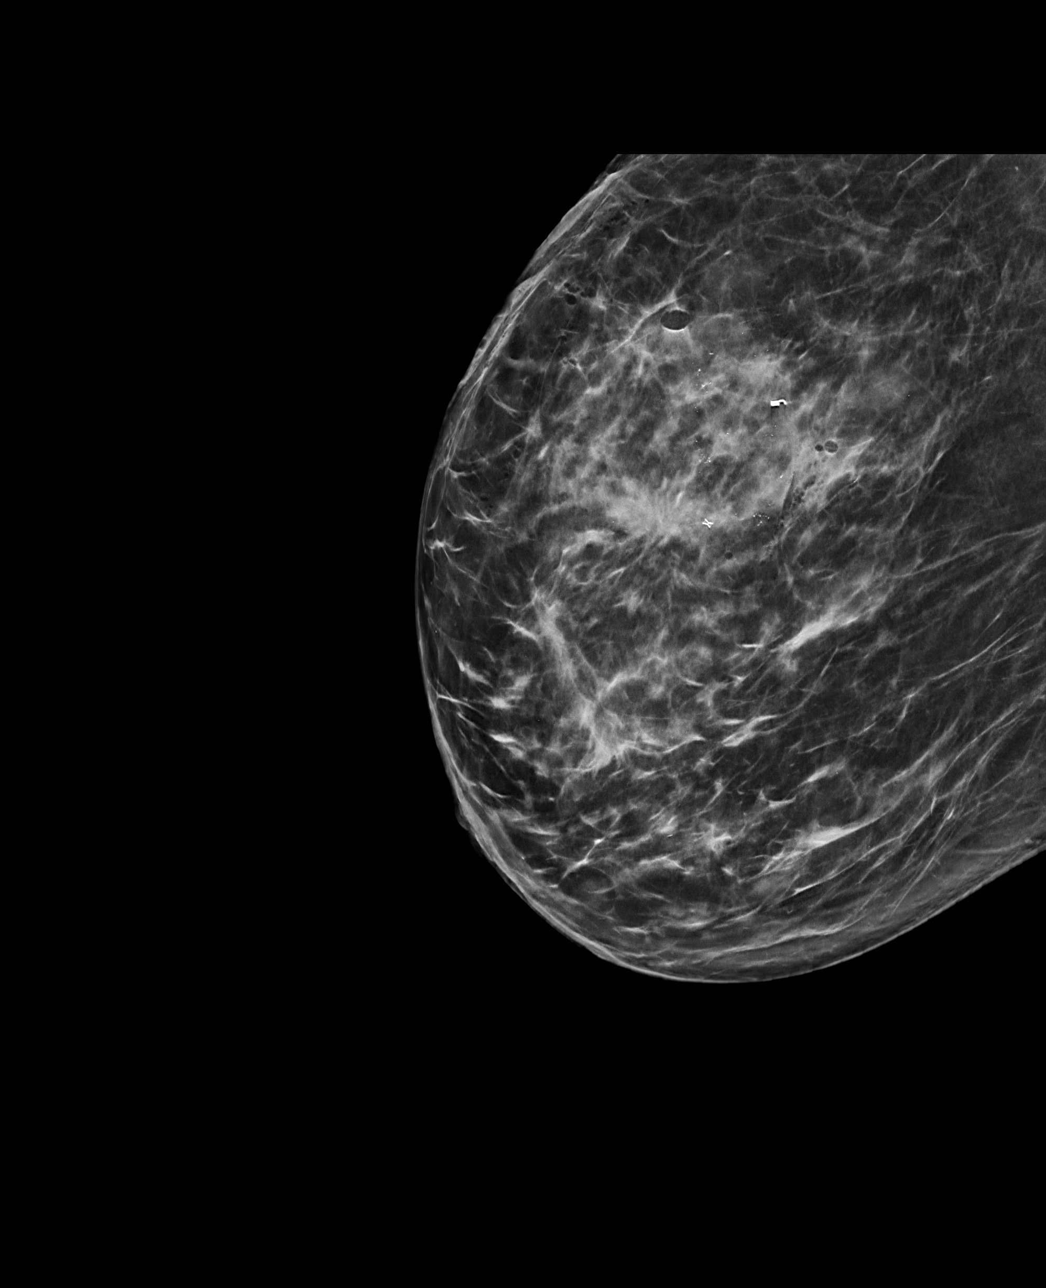

[R ML tomo · tomo slice 39/77.0]
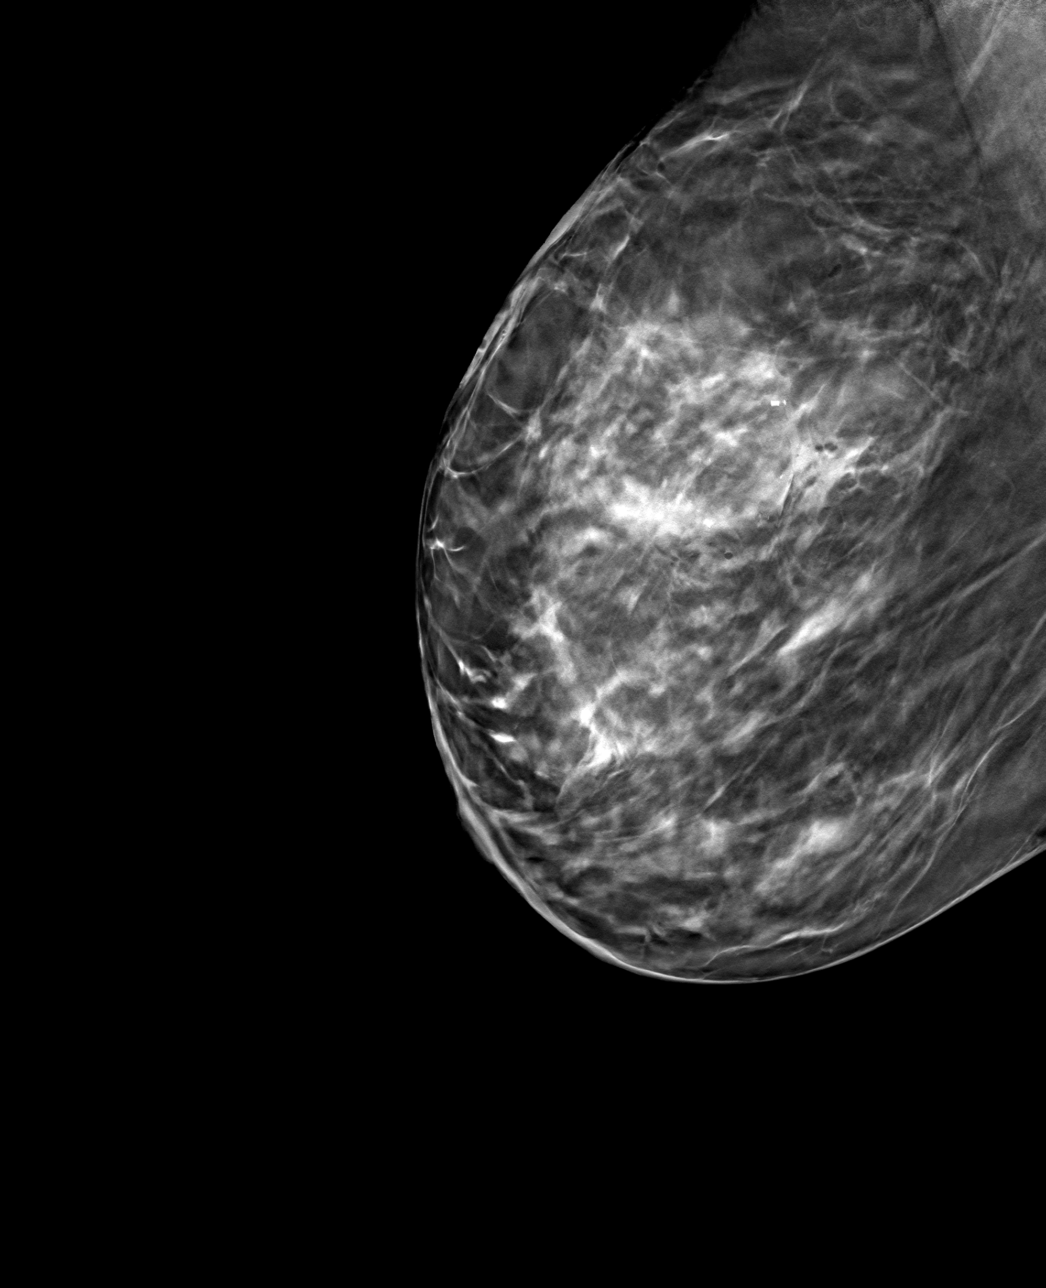

[R CC tomo · tomo slice 37/73.0]
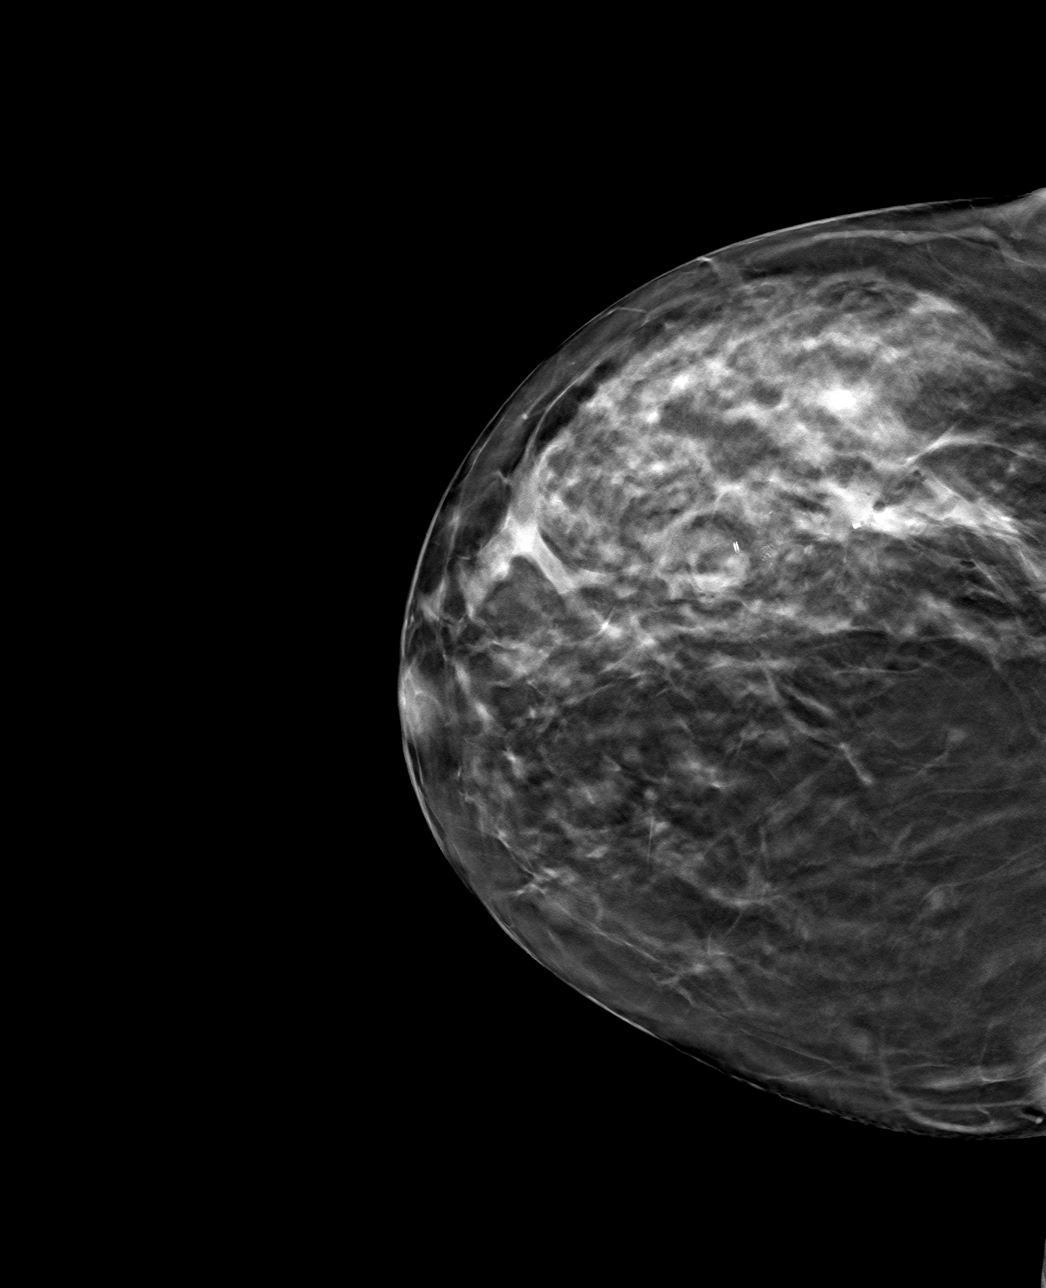

[4 of 12 positions shown; findings below may reference images not displayed]

FINDINGS: Mammographic images were obtained following stereotactic guided
biopsy of right breast calcifications. Both biopsy marker clips are
well positioned along the anterior and posterior extents of the
suspicious upper outer quadrant calcifications.
IMPRESSION: Appropriate positioning of the coil and X shaped biopsy clips
following stereotactic core needle biopsy of the right breast.

Final Assessment: Post Procedure Mammograms for Marker Placement

## 2020-07-08 IMAGING — MG MM BREAST BX W/ LOC DEV EA AD LESION IMAG BX SPEC STEREO GUIDE*R
7 of 8 series · 7 of 8 positions shown · non-contrast
Comparison: Previous exams.
COMPARISON: Previous exams.

Addendum:
CLINICAL DATA: Patient presents for stereotactic core needle biopsy
of suspicious right breast calcifications. Two biopsies performed,
the anterior and posterior extents of the calcifications.

EXAM:
RIGHT BREAST STEREOTACTIC CORE NEEDLE BIOPSY: 2 STEREOTACTIC CORE
BIOPSIES PERFORMED.

[R (1 of 7)]
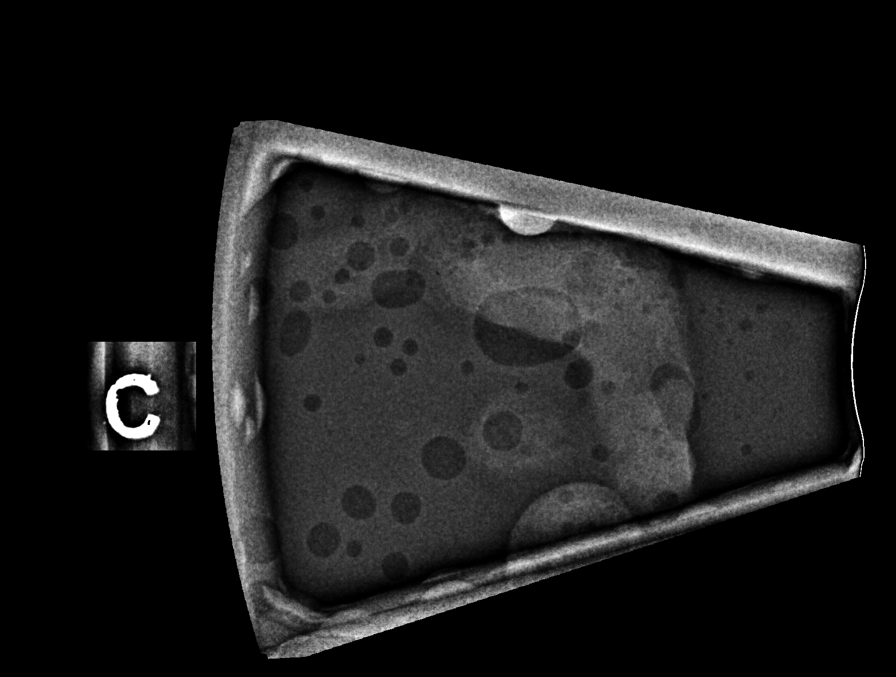

[R (2 of 7)]
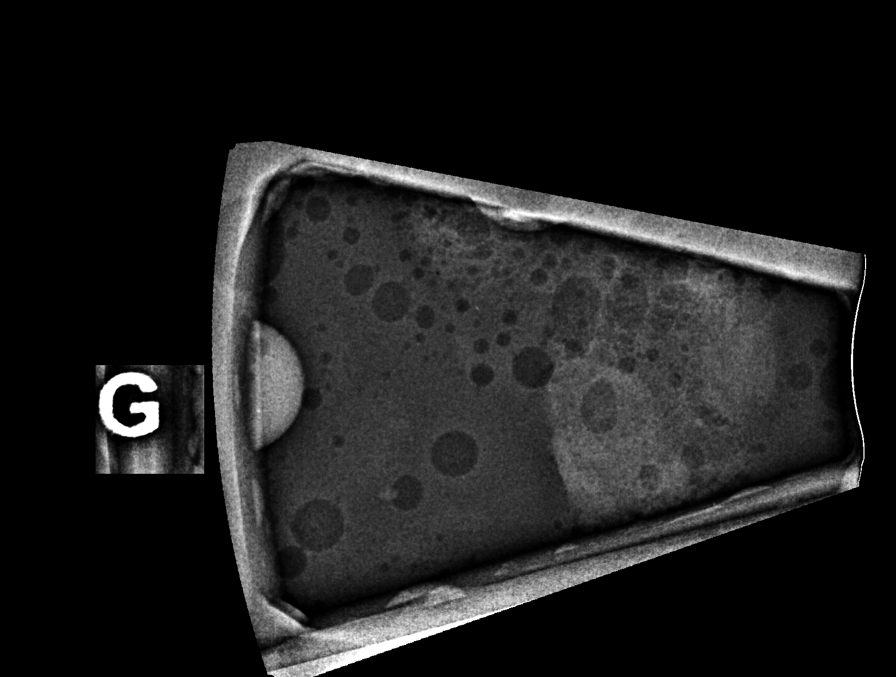

[R (3 of 7)]
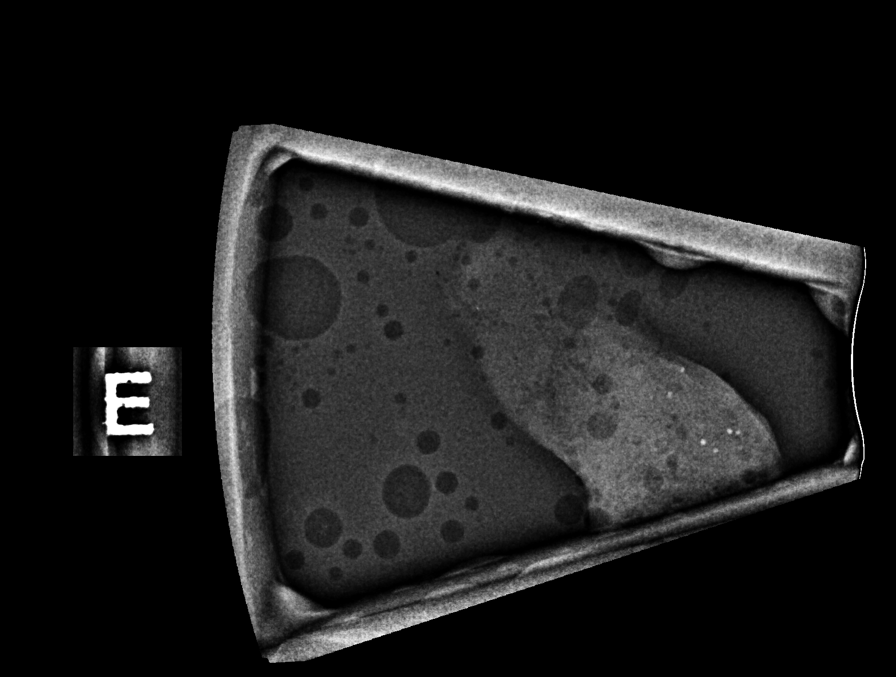

[R (4 of 7)]
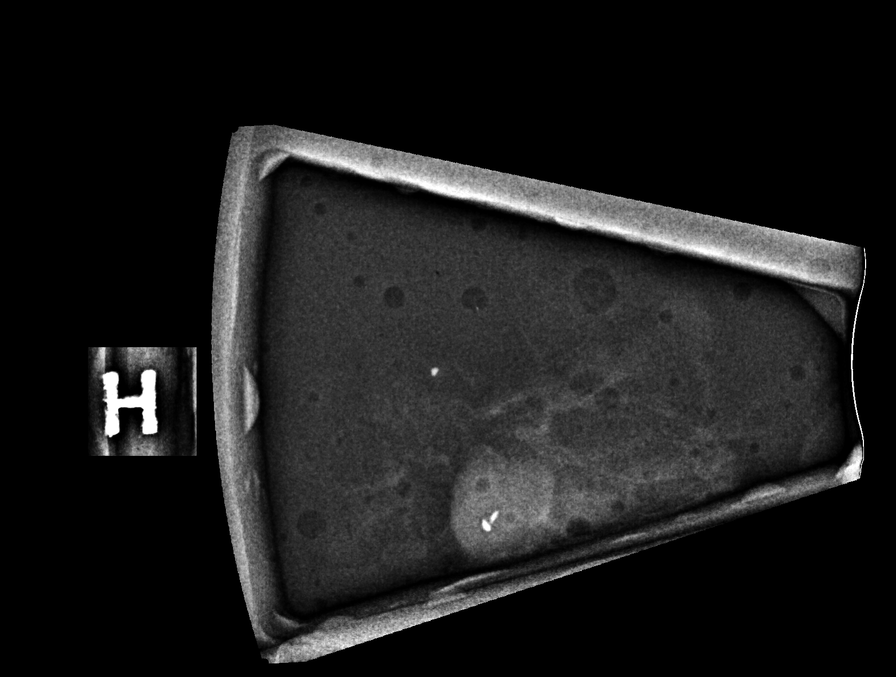

[R (5 of 7)]
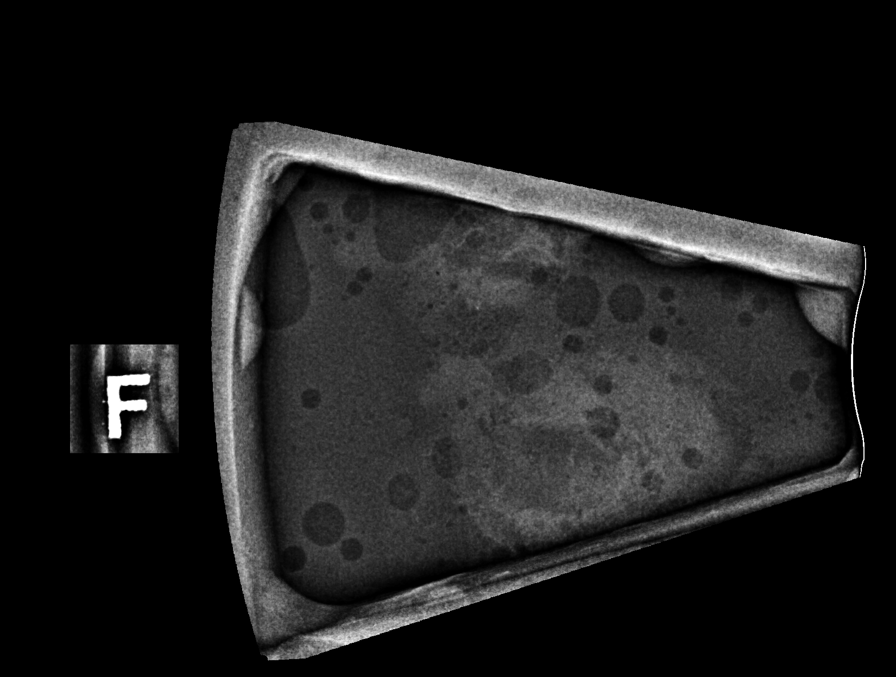

[R (6 of 7)]
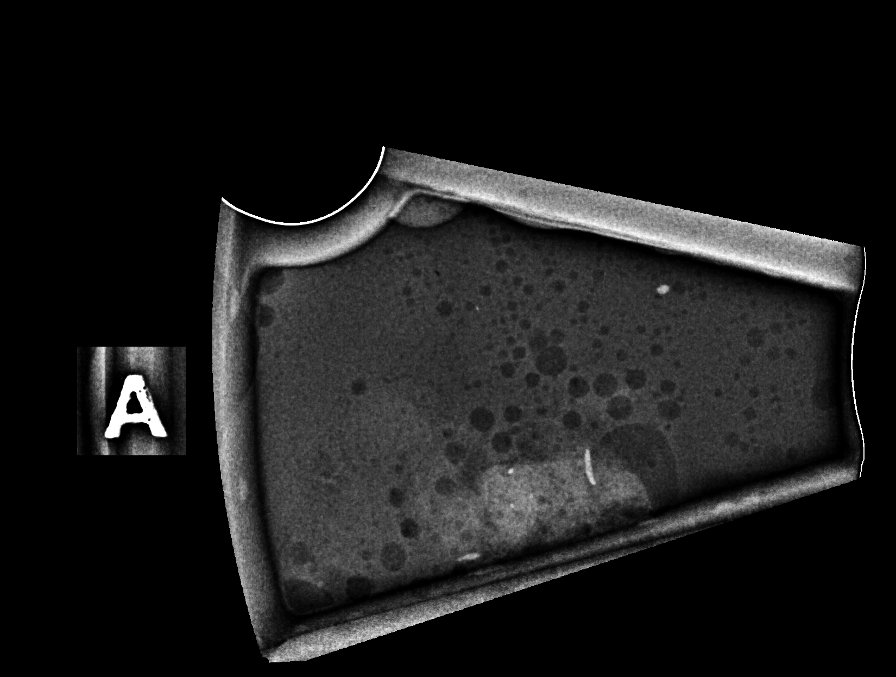

[R (7 of 7)]
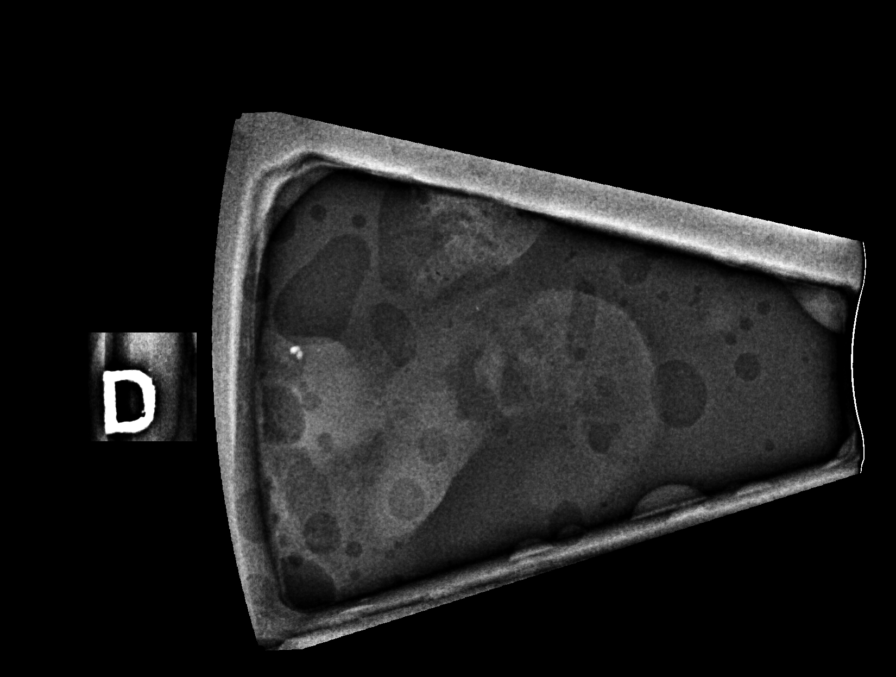

[7 of 8 positions shown; findings below may reference images not displayed]



Biopsy #1: Calcifications: Posterior extent.

Using sterile technique and 1% Lidocaine as local anesthetic, under
stereotactic guidance, a 9 gauge vacuum assisted device was used to
perform core needle biopsy of calcifications in the upper outer
quadrant the right breast, posterior extent, using a superior
approach. Specimen radiograph was performed showing calcifications
for which biopsy was performed. Specimens with calcifications are
identified for pathology.

Lesion quadrant: Upper outer quadrant

At the conclusion of the procedure, coil shaped tissue marker clip
was deployed into the biopsy cavity.

Biopsy #2: Calcifications: Anterior extent.

Using sterile technique and 1% Lidocaine as local anesthetic, under
stereotactic guidance, a 9 gauge vacuum assisted device was used to
perform core needle biopsy of calcifications in the upper outer
quadrant of the right breast, anterior extent, using a superior
approach. Specimen radiograph was performed showing calcifications
for which biopsy was performed. Specimens with calcifications are
identified for pathology.

Lesion quadrant: Upper outer quadrant

At the conclusion of the procedure, X shaped tissue marker clip was
deployed into the biopsy cavity.

Follow-up 2-view mammogram was performed and dictated separately.
IMPRESSION: Stereotactic-guided biopsy of the anterior and posterior extents of
suspicious calcifications in the upper outer right breast. No
apparent complications.

ADDENDUM:
Pathology revealed GRADE III INVASIVE DUCTAL CARCINOMA, HIGH GRADE
DUCTAL CARCINOMA IN SITU WITH NECROSIS of the Right breast, upper
outer quadrant, posterior. This was found to be concordant by Dr.
[facility].

Pathology revealed HIGH GRADE DUCTAL CARCINOMA IN SITU WITH
NECROSIS, MICROSCOPIC FOCUS SUSPICIOUS FOR INVASIVE DUCTAL CARCINOMA
of the Right breast, upper outer quadrant, anterior. This was found
to be concordant by Dr. J[facility].

Pathology results were discussed with the patient by telephone. The
patient reported doing well after the biopsies with tenderness at
the sites. Post biopsy instructions and care were reviewed and
questions were answered. The patient was encouraged to call The
direct phone number was provided.

The patient was referred to [REDACTED]
[REDACTED] at [REDACTED] on
[DATE].

The patient is scheduled for a Right axillary ultrasound at [facility] on [DATE].

Consideration for a bilateral breast MRI for further evaluation of
extent of disease given the high grade histology, age, and strong
family history of breast cancer.

Pathology results were discussed with [facility], at
[facility] on [DATE].

Pathology results reported by [facility], RN on [DATE].



Biopsy #1: Calcifications: Posterior extent.

Using sterile technique and 1% Lidocaine as local anesthetic, under
stereotactic guidance, a 9 gauge vacuum assisted device was used to
perform core needle biopsy of calcifications in the upper outer
quadrant the right breast, posterior extent, using a superior
approach. Specimen radiograph was performed showing calcifications
for which biopsy was performed. Specimens with calcifications are
identified for pathology.

Lesion quadrant: Upper outer quadrant

At the conclusion of the procedure, coil shaped tissue marker clip
was deployed into the biopsy cavity.

Biopsy #2: Calcifications: Anterior extent.

Using sterile technique and 1% Lidocaine as local anesthetic, under
stereotactic guidance, a 9 gauge vacuum assisted device was used to
perform core needle biopsy of calcifications in the upper outer
quadrant of the right breast, anterior extent, using a superior
approach. Specimen radiograph was performed showing calcifications
for which biopsy was performed. Specimens with calcifications are
identified for pathology.

Lesion quadrant: Upper outer quadrant

At the conclusion of the procedure, X shaped tissue marker clip was
deployed into the biopsy cavity.

Follow-up 2-view mammogram was performed and dictated separately.
IMPRESSION: Stereotactic-guided biopsy of the anterior and posterior extents of
suspicious calcifications in the upper outer right breast. No
apparent complications.

## 2020-07-08 IMAGING — MG MM BREAST BX W/ LOC DEV EA AD LESION IMAG BX SPEC STEREO GUIDE*R
3 series · 3 of 11 positions shown · non-contrast
Comparison: Previous exams.
COMPARISON: Previous exams.

Addendum:
CLINICAL DATA: Patient presents for stereotactic core needle biopsy
of suspicious right breast calcifications. Two biopsies performed,
the anterior and posterior extents of the calcifications.

EXAM:
RIGHT BREAST STEREOTACTIC CORE NEEDLE BIOPSY: 2 STEREOTACTIC CORE
BIOPSIES PERFORMED.

[R CC]
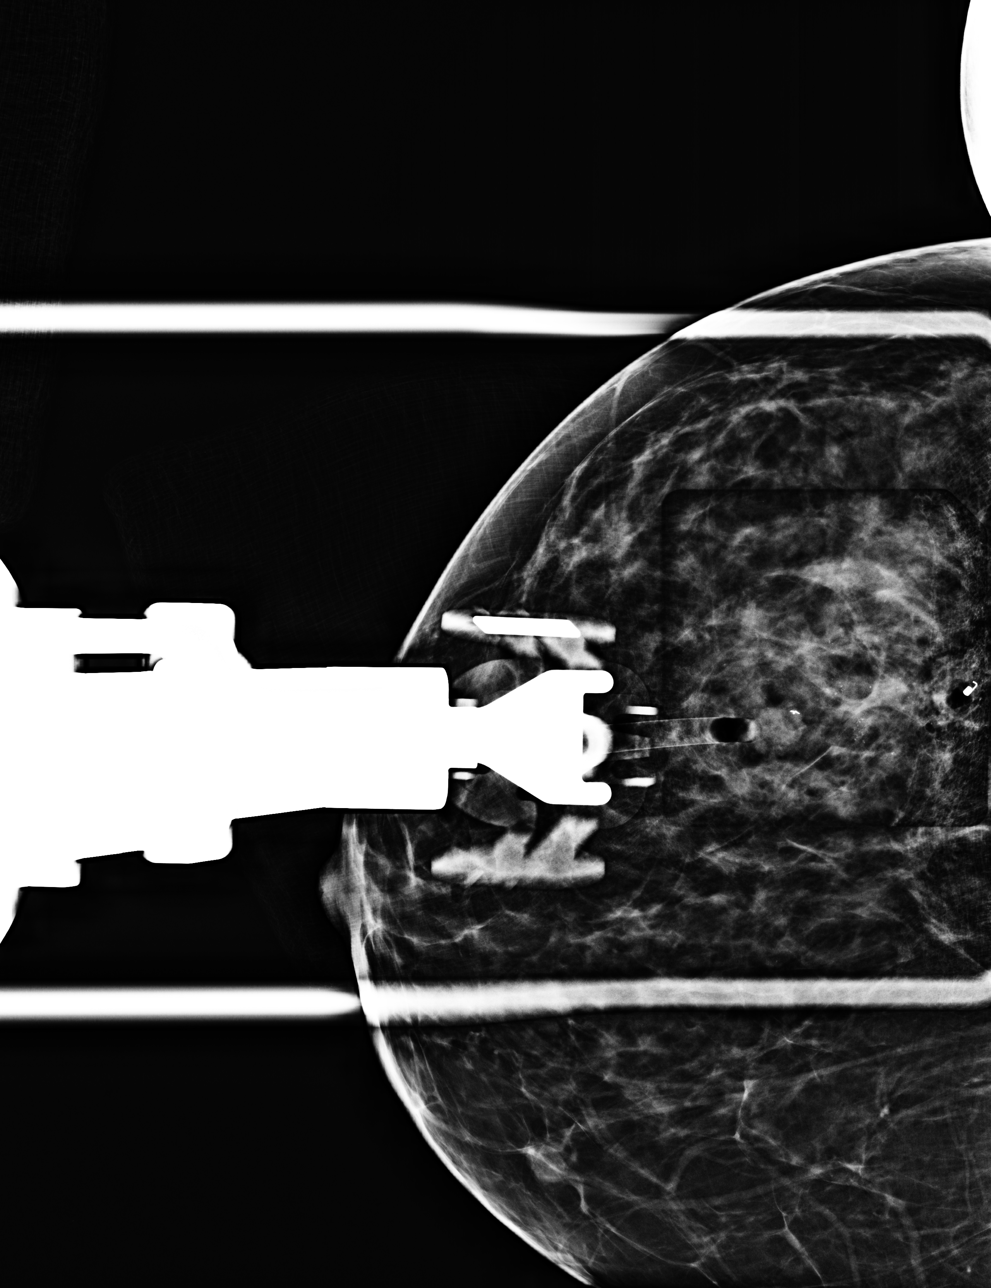

[R CC tomo (1 of 2) · tomo slice 31/60.0]
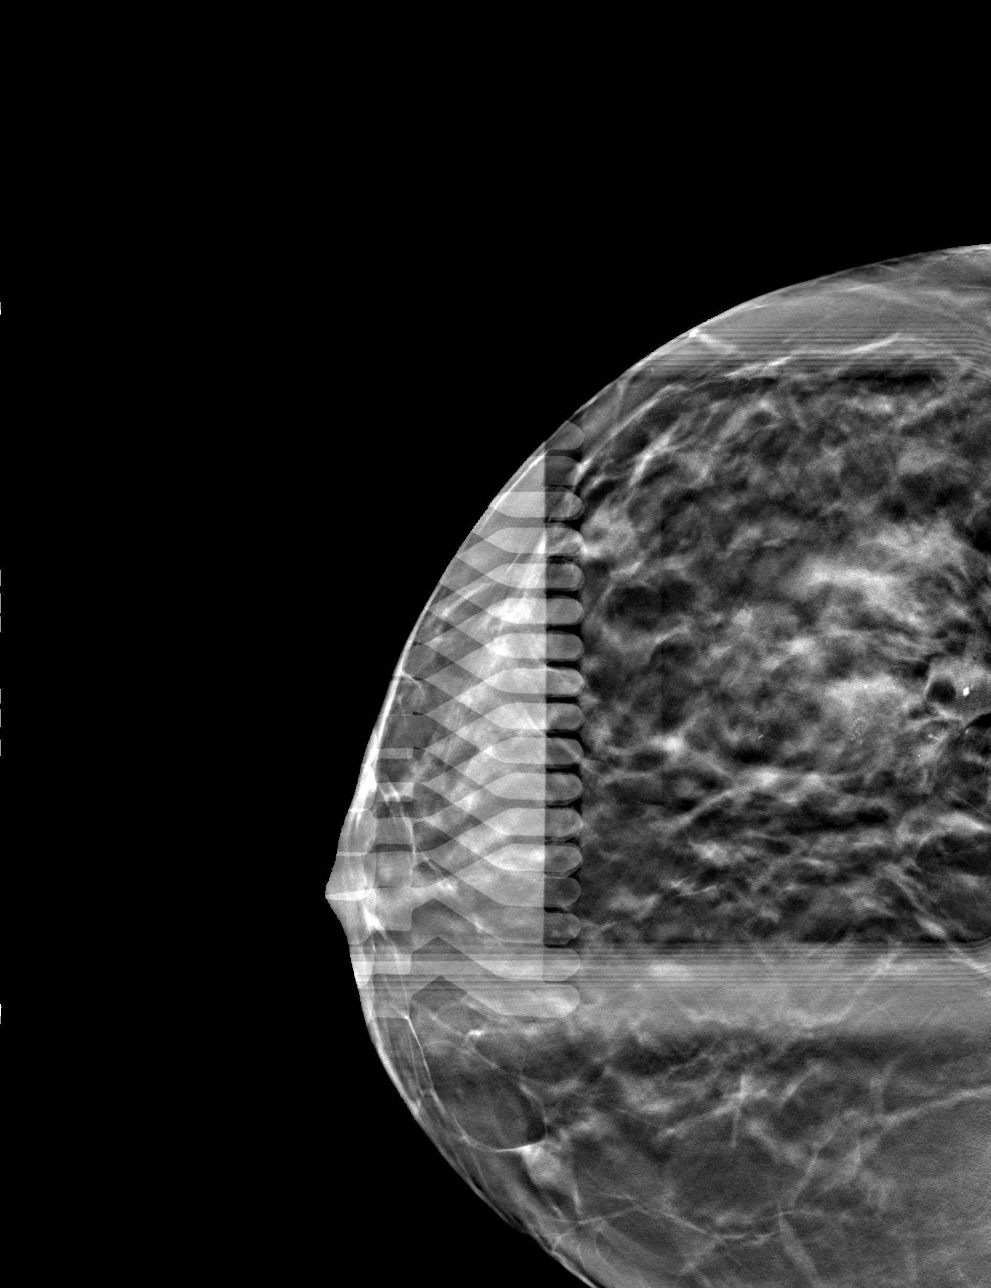

[R CC tomo (2 of 2) · tomo slice 31/60.0]
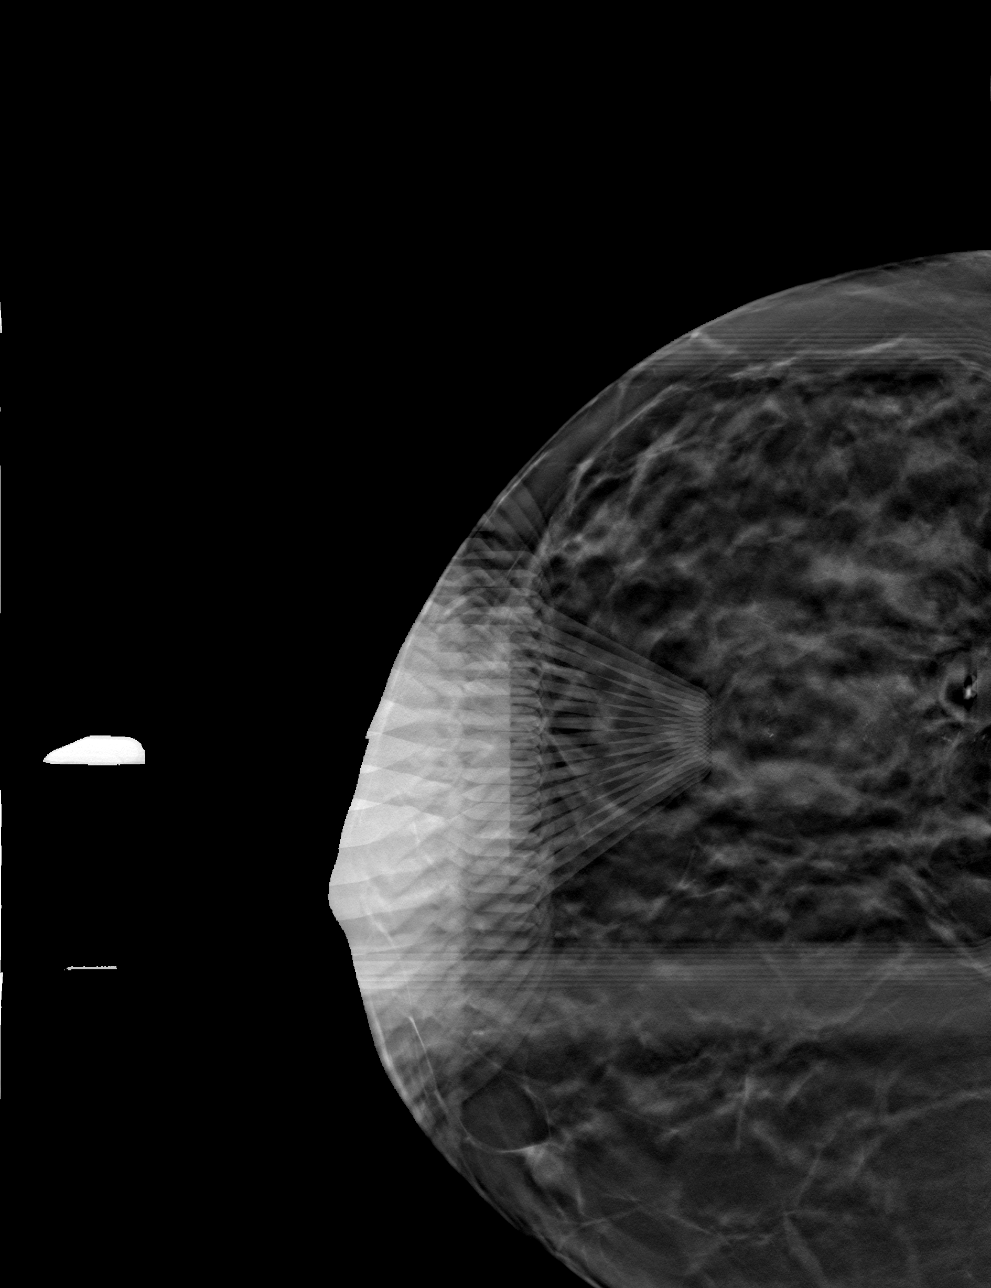

[3 of 11 positions shown; findings below may reference images not displayed]



Biopsy #1: Calcifications: Posterior extent.

Using sterile technique and 1% Lidocaine as local anesthetic, under
stereotactic guidance, a 9 gauge vacuum assisted device was used to
perform core needle biopsy of calcifications in the upper outer
quadrant the right breast, posterior extent, using a superior
approach. Specimen radiograph was performed showing calcifications
for which biopsy was performed. Specimens with calcifications are
identified for pathology.

Lesion quadrant: Upper outer quadrant

At the conclusion of the procedure, coil shaped tissue marker clip
was deployed into the biopsy cavity.

Biopsy #2: Calcifications: Anterior extent.

Using sterile technique and 1% Lidocaine as local anesthetic, under
stereotactic guidance, a 9 gauge vacuum assisted device was used to
perform core needle biopsy of calcifications in the upper outer
quadrant of the right breast, anterior extent, using a superior
approach. Specimen radiograph was performed showing calcifications
for which biopsy was performed. Specimens with calcifications are
identified for pathology.

Lesion quadrant: Upper outer quadrant

At the conclusion of the procedure, X shaped tissue marker clip was
deployed into the biopsy cavity.

Follow-up 2-view mammogram was performed and dictated separately.
IMPRESSION: Stereotactic-guided biopsy of the anterior and posterior extents of
suspicious calcifications in the upper outer right breast. No
apparent complications.

ADDENDUM:
Pathology revealed GRADE III INVASIVE DUCTAL CARCINOMA, HIGH GRADE
DUCTAL CARCINOMA IN SITU WITH NECROSIS of the Right breast, upper
outer quadrant, posterior. This was found to be concordant by Dr.
JIM.

Pathology revealed HIGH GRADE DUCTAL CARCINOMA IN SITU WITH
NECROSIS, MICROSCOPIC FOCUS SUSPICIOUS FOR INVASIVE DUCTAL CARCINOMA
of the Right breast, upper outer quadrant, anterior. This was found
to be concordant by Dr. JIM.

Pathology results were discussed with the patient by telephone. The
patient reported doing well after the biopsies with tenderness at
the sites. Post biopsy instructions and care were reviewed and
questions were answered. The patient was encouraged to call The
direct phone number was provided.

The patient was referred to [REDACTED]
[REDACTED] at [REDACTED] on
[DATE].

The patient is scheduled for a Right axillary ultrasound at [facility] on [DATE].

Consideration for a bilateral breast MRI for further evaluation of
extent of disease given the high grade histology, age, and strong
family history of breast cancer.

Pathology results were discussed with [facility], at
[facility] on [DATE].

Pathology results reported by JIM, RN on [DATE].



Biopsy #1: Calcifications: Posterior extent.

Using sterile technique and 1% Lidocaine as local anesthetic, under
stereotactic guidance, a 9 gauge vacuum assisted device was used to
perform core needle biopsy of calcifications in the upper outer
quadrant the right breast, posterior extent, using a superior
approach. Specimen radiograph was performed showing calcifications
for which biopsy was performed. Specimens with calcifications are
identified for pathology.

Lesion quadrant: Upper outer quadrant

At the conclusion of the procedure, coil shaped tissue marker clip
was deployed into the biopsy cavity.

Biopsy #2: Calcifications: Anterior extent.

Using sterile technique and 1% Lidocaine as local anesthetic, under
stereotactic guidance, a 9 gauge vacuum assisted device was used to
perform core needle biopsy of calcifications in the upper outer
quadrant of the right breast, anterior extent, using a superior
approach. Specimen radiograph was performed showing calcifications
for which biopsy was performed. Specimens with calcifications are
identified for pathology.

Lesion quadrant: Upper outer quadrant

At the conclusion of the procedure, X shaped tissue marker clip was
deployed into the biopsy cavity.

Follow-up 2-view mammogram was performed and dictated separately.
IMPRESSION: Stereotactic-guided biopsy of the anterior and posterior extents of
suspicious calcifications in the upper outer right breast. No
apparent complications.

## 2020-07-08 IMAGING — MG MM BREAST BX W/ LOC DEV 1ST LESION IMAGE BX SPEC STEREO GUIDE*R*
7 of 14 series · 7 of 22 positions shown · non-contrast
Comparison: Previous exams.
COMPARISON: Previous exams.

Addendum:
CLINICAL DATA: Patient presents for stereotactic core needle biopsy
of suspicious right breast calcifications. Two biopsies performed,
the anterior and posterior extents of the calcifications.

EXAM:
RIGHT BREAST STEREOTACTIC CORE NEEDLE BIOPSY: 2 STEREOTACTIC CORE
BIOPSIES PERFORMED.

[R (1 of 7)]
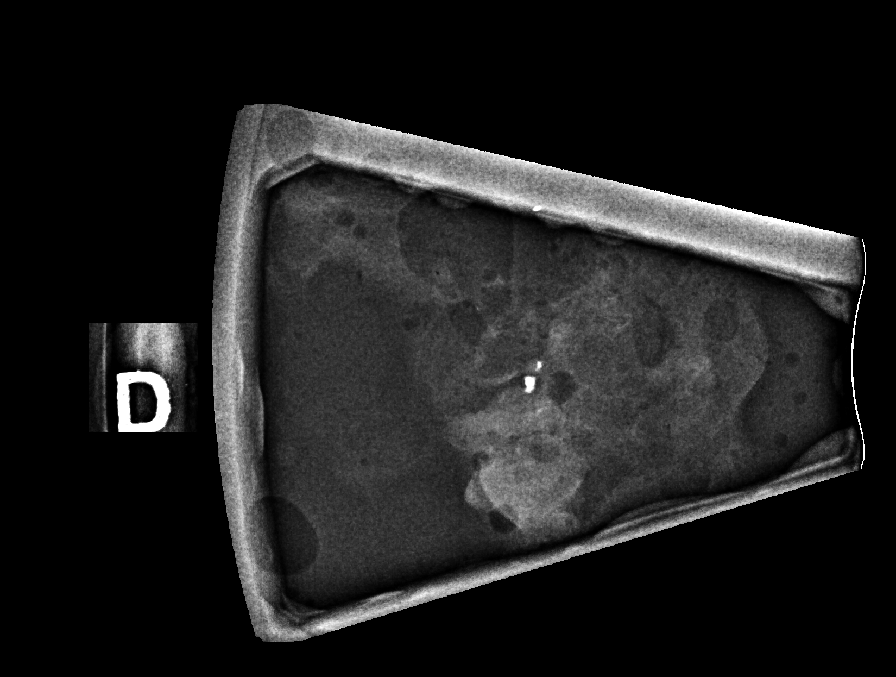

[R (2 of 7)]
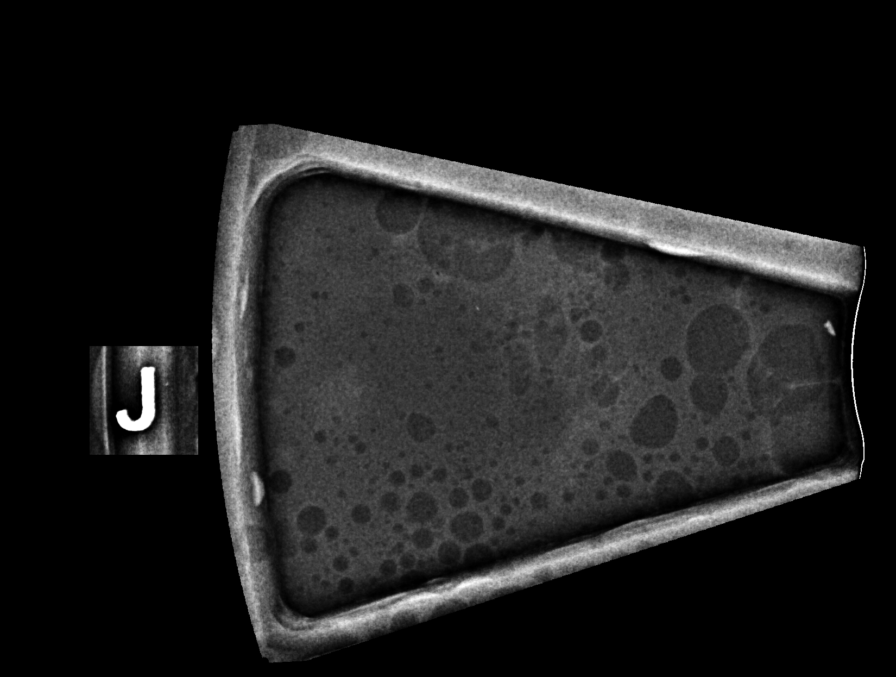

[R (3 of 7)]
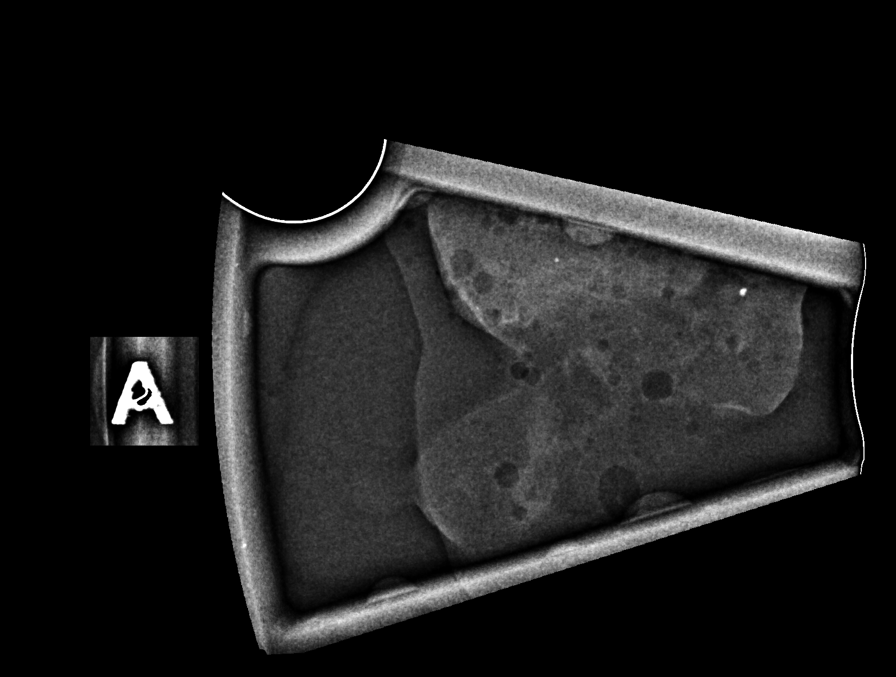

[R (4 of 7)]
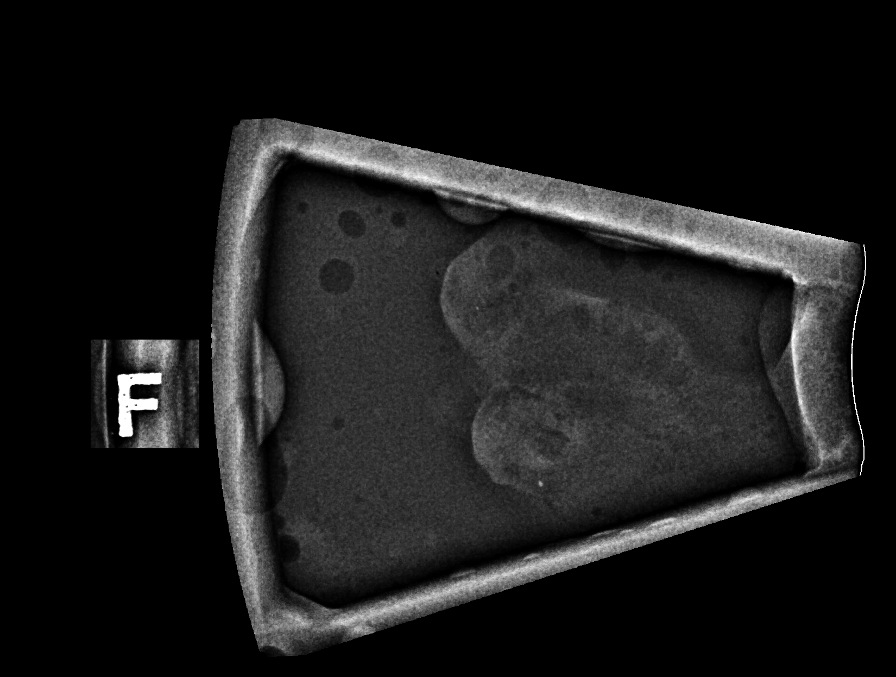

[R (5 of 7)]
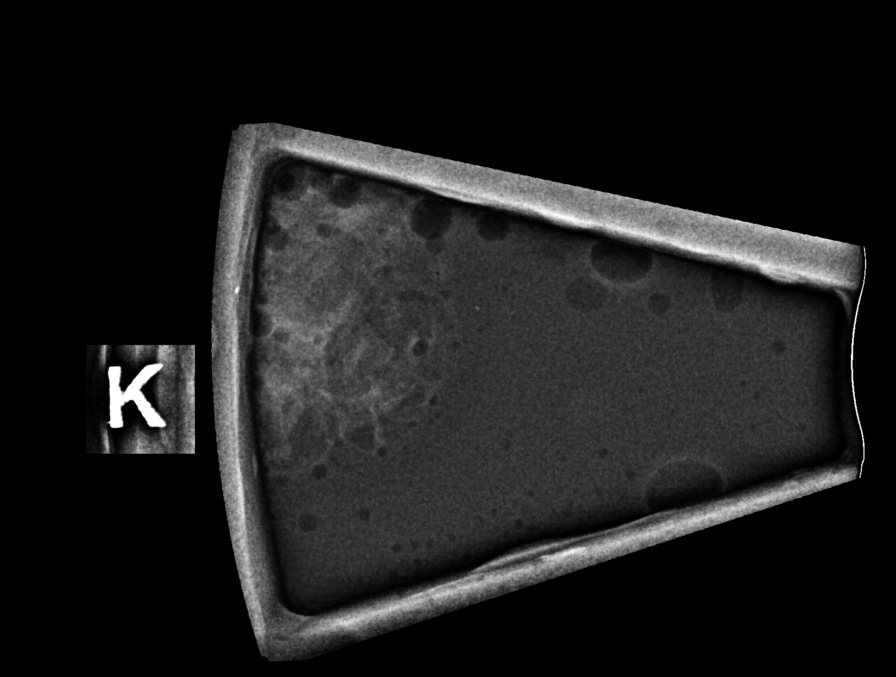

[R (6 of 7)]
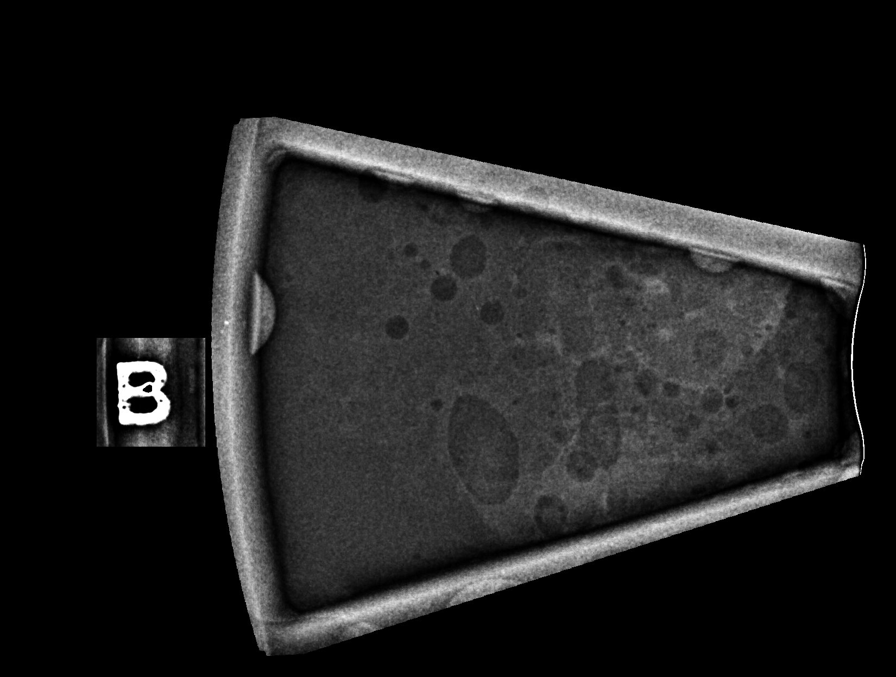

[R (7 of 7)]
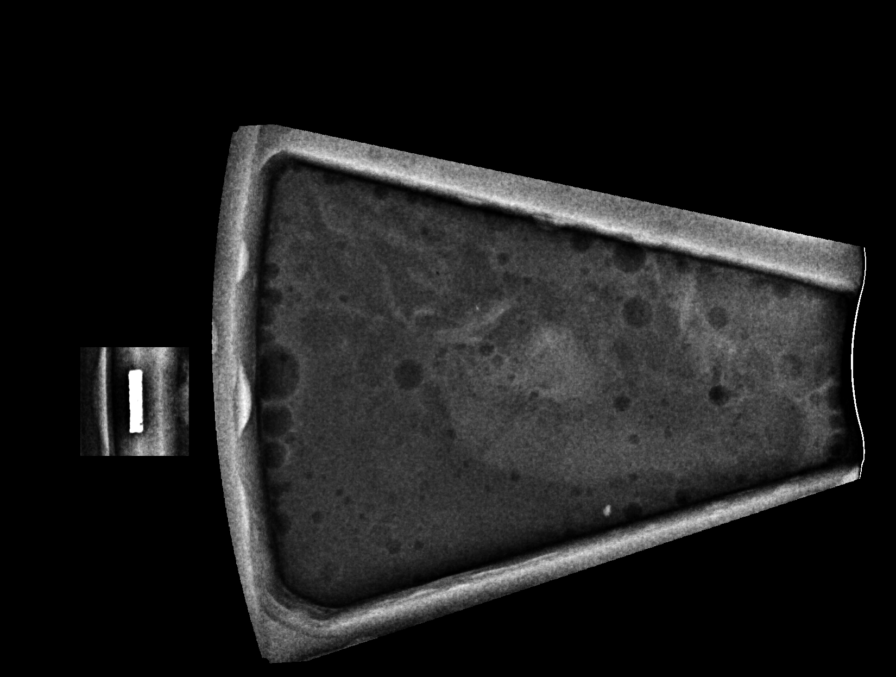

[7 of 22 positions shown; findings below may reference images not displayed]



Biopsy #1: Calcifications: Posterior extent.

Using sterile technique and 1% Lidocaine as local anesthetic, under
stereotactic guidance, a 9 gauge vacuum assisted device was used to
perform core needle biopsy of calcifications in the upper outer
quadrant the right breast, posterior extent, using a superior
approach. Specimen radiograph was performed showing calcifications
for which biopsy was performed. Specimens with calcifications are
identified for pathology.

Lesion quadrant: Upper outer quadrant

At the conclusion of the procedure, coil shaped tissue marker clip
was deployed into the biopsy cavity.

Biopsy #2: Calcifications: Anterior extent.

Using sterile technique and 1% Lidocaine as local anesthetic, under
stereotactic guidance, a 9 gauge vacuum assisted device was used to
perform core needle biopsy of calcifications in the upper outer
quadrant of the right breast, anterior extent, using a superior
approach. Specimen radiograph was performed showing calcifications
for which biopsy was performed. Specimens with calcifications are
identified for pathology.

Lesion quadrant: Upper outer quadrant

At the conclusion of the procedure, X shaped tissue marker clip was
deployed into the biopsy cavity.

Follow-up 2-view mammogram was performed and dictated separately.
IMPRESSION: Stereotactic-guided biopsy of the anterior and posterior extents of
suspicious calcifications in the upper outer right breast. No
apparent complications.

ADDENDUM:
Pathology revealed GRADE III INVASIVE DUCTAL CARCINOMA, HIGH GRADE
DUCTAL CARCINOMA IN SITU WITH NECROSIS of the Right breast, upper
outer quadrant, posterior. This was found to be concordant by Dr.
JIM.

Pathology revealed HIGH GRADE DUCTAL CARCINOMA IN SITU WITH
NECROSIS, MICROSCOPIC FOCUS SUSPICIOUS FOR INVASIVE DUCTAL CARCINOMA
of the Right breast, upper outer quadrant, anterior. This was found
to be concordant by Dr. JIM.

Pathology results were discussed with the patient by telephone. The
patient reported doing well after the biopsies with tenderness at
the sites. Post biopsy instructions and care were reviewed and
questions were answered. The patient was encouraged to call The
direct phone number was provided.

The patient was referred to [REDACTED]
[REDACTED] at [REDACTED] on
[DATE].

The patient is scheduled for a Right axillary ultrasound at [facility] on [DATE].

Consideration for a bilateral breast MRI for further evaluation of
extent of disease given the high grade histology, age, and strong
family history of breast cancer.

Pathology results were discussed with [facility], at
[facility] on [DATE].

Pathology results reported by [facility], RN on [DATE].



Biopsy #1: Calcifications: Posterior extent.

Using sterile technique and 1% Lidocaine as local anesthetic, under
stereotactic guidance, a 9 gauge vacuum assisted device was used to
perform core needle biopsy of calcifications in the upper outer
quadrant the right breast, posterior extent, using a superior
approach. Specimen radiograph was performed showing calcifications
for which biopsy was performed. Specimens with calcifications are
identified for pathology.

Lesion quadrant: Upper outer quadrant

At the conclusion of the procedure, coil shaped tissue marker clip
was deployed into the biopsy cavity.

Biopsy #2: Calcifications: Anterior extent.

Using sterile technique and 1% Lidocaine as local anesthetic, under
stereotactic guidance, a 9 gauge vacuum assisted device was used to
perform core needle biopsy of calcifications in the upper outer
quadrant of the right breast, anterior extent, using a superior
approach. Specimen radiograph was performed showing calcifications
for which biopsy was performed. Specimens with calcifications are
identified for pathology.

Lesion quadrant: Upper outer quadrant

At the conclusion of the procedure, X shaped tissue marker clip was
deployed into the biopsy cavity.

Follow-up 2-view mammogram was performed and dictated separately.
IMPRESSION: Stereotactic-guided biopsy of the anterior and posterior extents of
suspicious calcifications in the upper outer right breast. No
apparent complications.

## 2020-07-10 ENCOUNTER — Telehealth: Payer: Self-pay | Admitting: Hematology and Oncology

## 2020-07-10 ENCOUNTER — Other Ambulatory Visit: Payer: Self-pay | Admitting: Family Medicine

## 2020-07-10 DIAGNOSIS — D0511 Intraductal carcinoma in situ of right breast: Secondary | ICD-10-CM

## 2020-07-10 NOTE — Telephone Encounter (Signed)
Spoke to patient to confirm afternoon [facility] appointment for 4/6, packet emailed to patient

## 2020-07-12 ENCOUNTER — Other Ambulatory Visit: Payer: Self-pay

## 2020-07-12 ENCOUNTER — Encounter: Payer: Self-pay | Admitting: *Deleted

## 2020-07-12 ENCOUNTER — Ambulatory Visit
Admission: RE | Admit: 2020-07-12 | Discharge: 2020-07-12 | Disposition: A | Discharge status: Home / Self Care | Payer: BC Managed Care – PPO | Source: Ambulatory Visit | Attending: Family Medicine | Admitting: Family Medicine

## 2020-07-12 DIAGNOSIS — Z170 Estrogen receptor positive status [ER+]: Secondary | ICD-10-CM | POA: Insufficient documentation

## 2020-07-12 DIAGNOSIS — C50411 Malignant neoplasm of upper-outer quadrant of right female breast: Secondary | ICD-10-CM

## 2020-07-12 DIAGNOSIS — D0511 Intraductal carcinoma in situ of right breast: Secondary | ICD-10-CM

## 2020-07-12 IMAGING — MR MR BREAST BILAT WO/W CM
7 of 11 series · 29 of 48 positions shown · IV contrast (9 ML GADAVIST)
Comparison: [DATE] and MRI on [DATE]

CLINICAL DATA: Family history of breast cancer diagnosed in the
patient's mother, maternal aunt, and paternal grandmother. Patient
had recent stereotactic guided core biopsy of anterior and posterior
extent of suspicious RIGHT breast calcifications in the UPPER-OUTER
QUADRANT. Pathology showed high-grade invasive ductal carcinoma with
DCIS at the posterior site and high-grade ductal carcinoma with
microscopic focus suspicious for invasive carcinoma at the anterior
site.

LABS:  None obtained at the time of imaging.
EXAM:
BILATERAL BREAST MRI WITH AND WITHOUT CONTRAST
TECHNIQUE: Multiplanar, multisequence MR images of both breasts were obtained
prior to and following the intravenous administration of 9 ml of
Gadavist

[Series 2: t2_tirm_tra ipat (a-p) · axial · 3.0mm · 0.70mm/px · 1 of 60 slices shown]
[im 1/60]
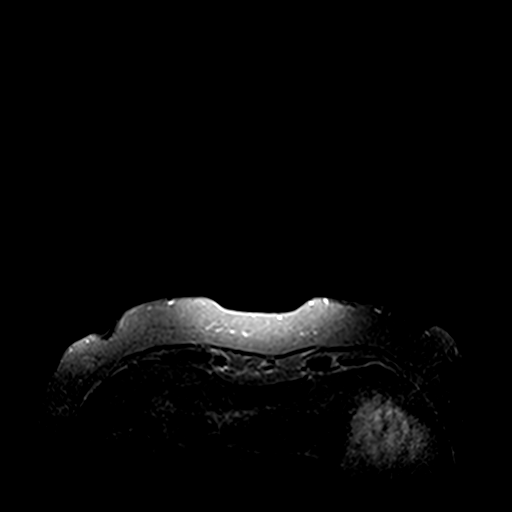

[Series 3: fl3d pre-cm no · axial · non-contrast · 0.9mm · 0.94mm/px · z∈[-82,+76]mm · 5 of 176 slices shown]
[im 1/176]
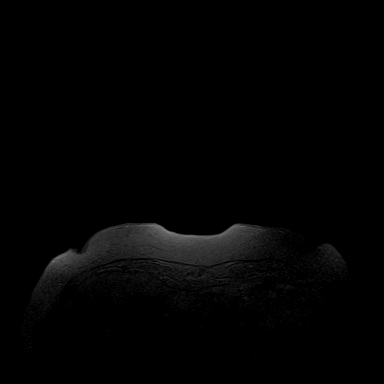
[im 44/176]
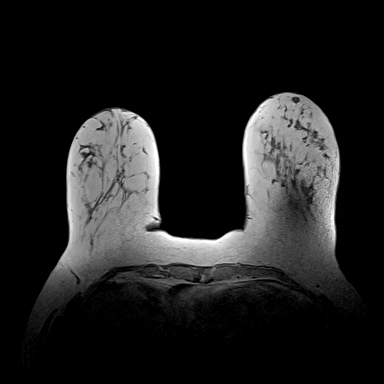
[im 88/176]
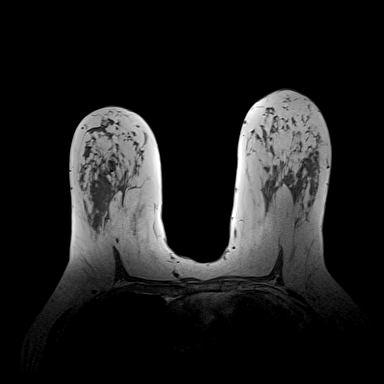
[im 132/176]
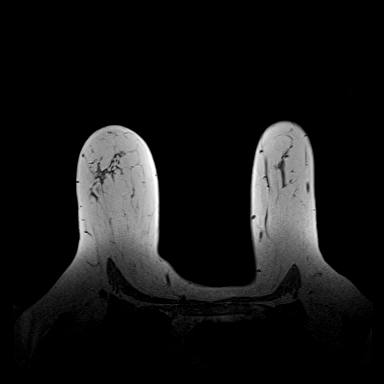
[im 176/176]
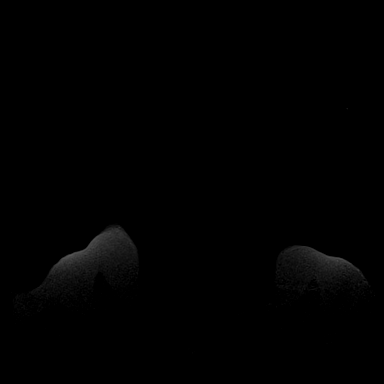

[Series 4: fl3d pre-cm · axial · non-contrast · 0.9mm · 0.87mm/px · z∈[-82,+76]mm · 5 of 176 slices shown]
[im 1/176]
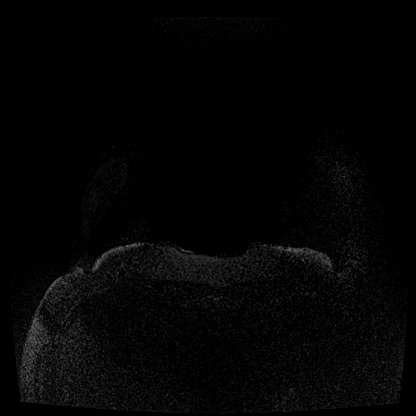
[im 44/176]
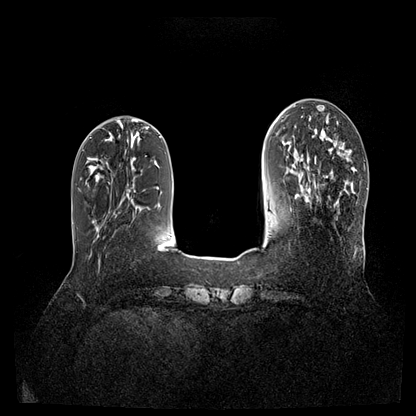
[im 88/176]
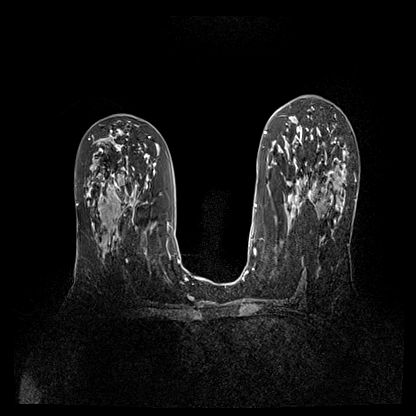
[im 132/176]
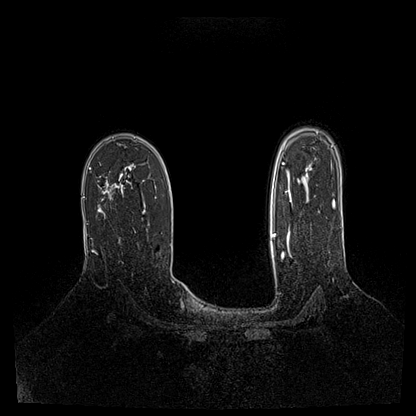
[im 176/176]
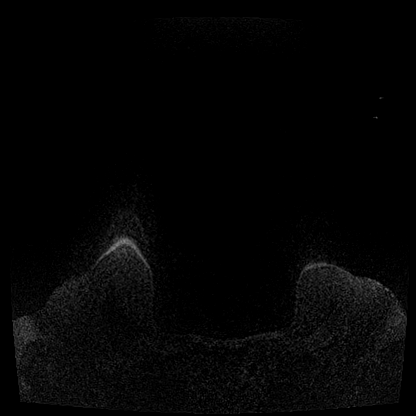

[Series 5: fl3d post-cm 20 · axial · 0.9mm · 0.87mm/px · z∈[-82,+76]mm · 5 of 176 slices shown (1 of 3)]
[im 1/176]
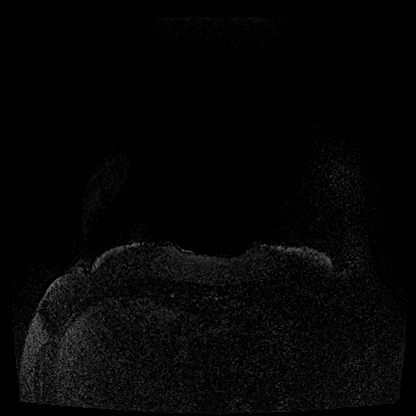
[im 44/176]
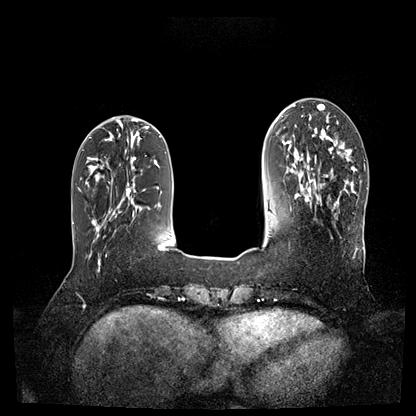
[im 88/176]
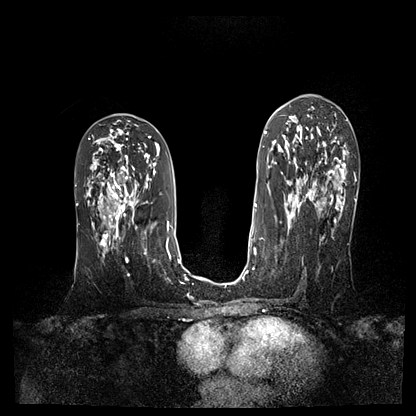
[im 132/176]
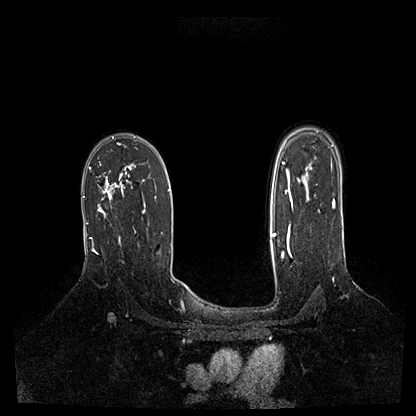
[im 176/176]
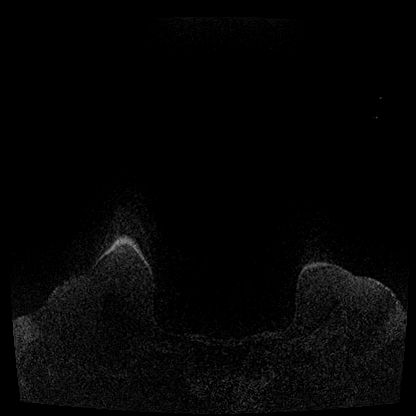

[Series 6: fl3d post-cm 20 · axial · 0.9mm · 0.87mm/px · z∈[-82,+76]mm · 6 of 176 slices shown (2 of 3)]
[im 1/176]
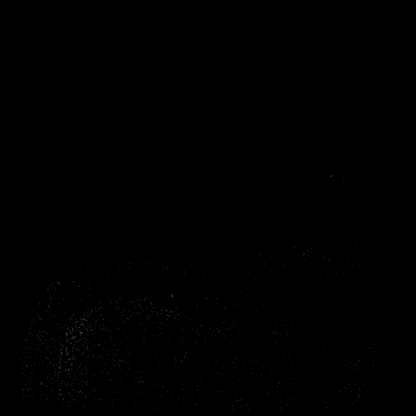
[im 36/176]
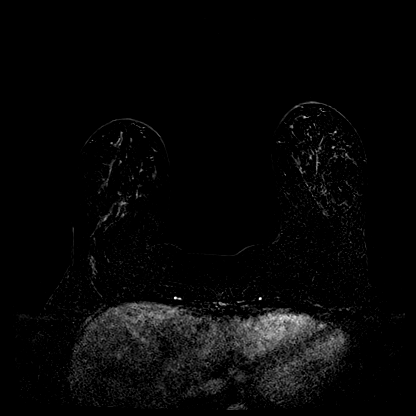
[im 71/176]
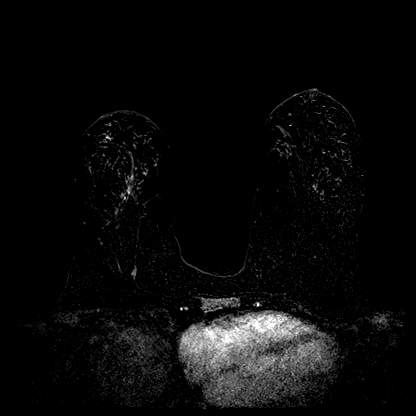
[im 106/176]
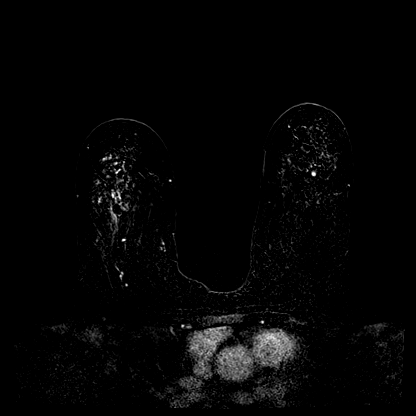
[im 141/176]
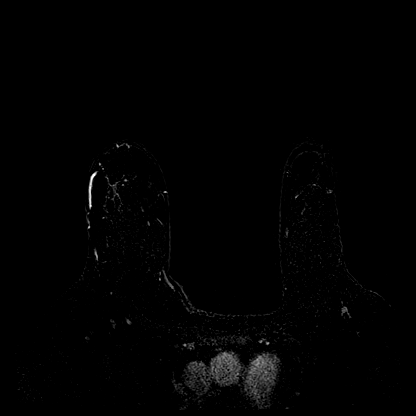
[im 176/176]
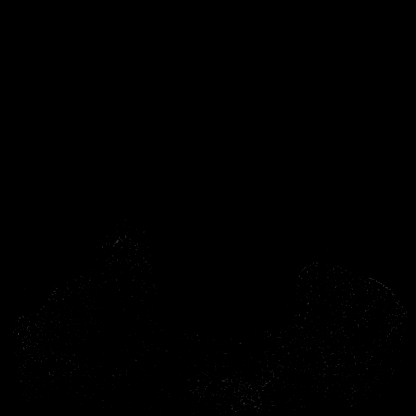

[Series 7: fl3d post-cm 20 · axial · 158.4mm · 0.87mm/px · 1 of 1 slices shown (3 of 3)]
[im 1/1]
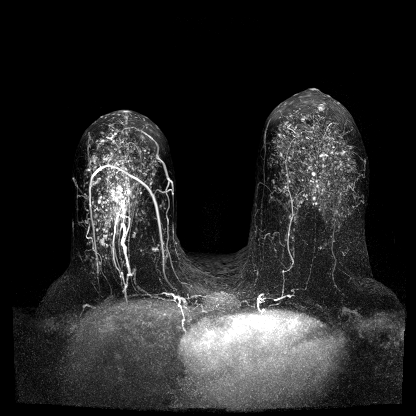

[Series 8: fl3d post-cm 3min · axial · 0.9mm · 0.87mm/px · z∈[-82,+76]mm · 6 of 176 slices shown]
[im 1/176]
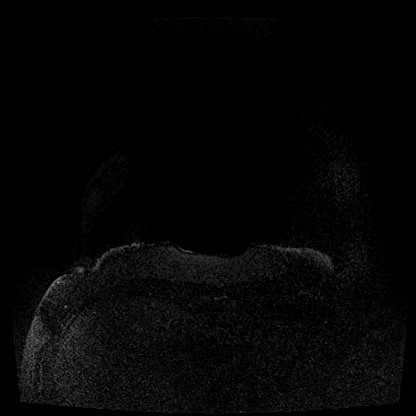
[im 36/176]
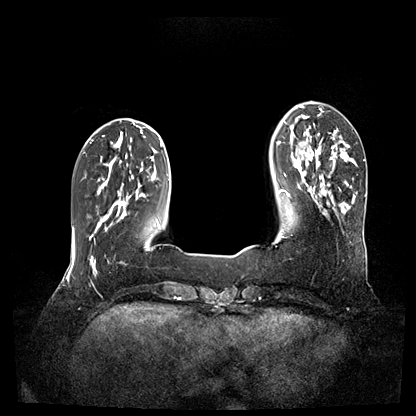
[im 71/176]
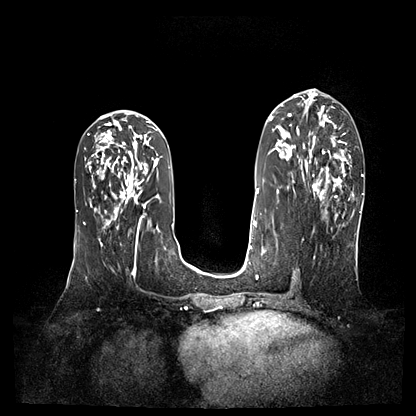
[im 106/176]
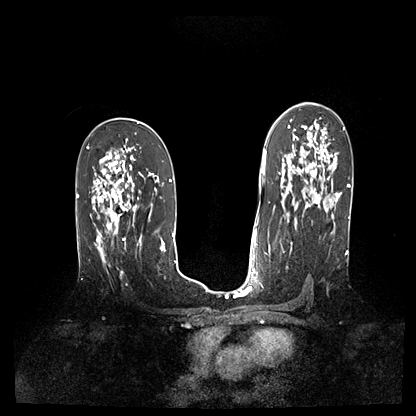
[im 141/176]
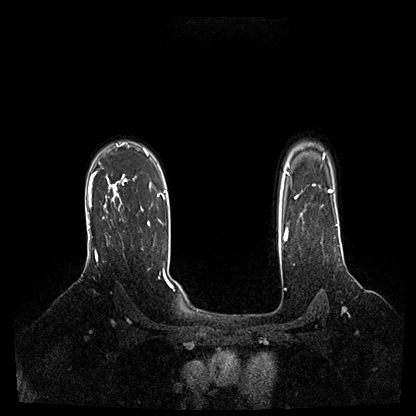
[im 176/176]
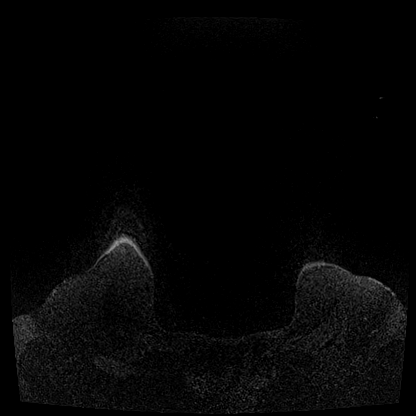

[29 of 48 positions shown; findings below may reference images not displayed]

Three-dimensional MR images were rendered by post-processing of the
original MR data on an independent workstation. The
three-dimensional MR images were interpreted, and findings are
reported in the following complete MRI report for this study. Three
dimensional images were evaluated at the independent interpreting
workstation using the DynaCAD thin client.
FINDINGS: Breast composition: c. Heterogeneous fibroglandular tissue.

Background parenchymal enhancement: Moderate.

Right breast: Within the UPPER central portion of the RIGHT breast,
there is clumped non mass enhancement, slightly greater than
background. This area measures 4.1 x 3.6 x 1.4 centimeters. Large
marker clip artifact marks the posterior aspect of the area of
enhancement, consistent with a coil clip. A small artifact marks the
anterior aspect of the enhancement, consistent with an X shaped
clip. The area of enhancement correlates well with the distribution
of calcifications seen mammographically. No additional areas of
concern in the RIGHT breast.

Left breast: No mass or abnormal enhancement.

Lymph nodes: No abnormal appearing lymph nodes.

Ancillary findings:  None
IMPRESSION: 1. UPPER central non mass enhancement in the RIGHT breast, spanning
4.1 centimeters and correlating well with the distribution of
calcifications seen mammographically.
2. The tissue marker clips mark the anterior and posterior aspects
of the enhancement.
3. No new or suspicious findings in the LEFT breast.

RECOMMENDATION:
Treatment plan for known RIGHT breast malignancy.

BI-RADS CATEGORY  6: Known biopsy-proven malignancy.

## 2020-07-12 MED ORDER — GADOBUTROL 1 MMOL/ML IV SOLN
9.0000 mL | Freq: Once | INTRAVENOUS | Status: AC | PRN
  Administered 2020-07-12: 9 mL via INTRAVENOUS

## 2020-07-16 NOTE — Progress Notes (Signed)
[facility] NOTE  Patient Care Team: Caryl Bis, MD as PCP - General (Family Medicine) Mauro Kaufmann, RN as Oncology Nurse Navigator Rockwell Germany, RN as Oncology Nurse Navigator Rolm Bookbinder, MD as Consulting Physician (General Surgery) Nicholas Lose, MD as Consulting Physician (Hematology and Oncology) Kyung Rudd, MD as Consulting Physician (Radiation Oncology)  CHIEF COMPLAINTS/PURPOSE OF CONSULTATION:  Newly diagnosed breast cancer  HISTORY OF PRESENTING ILLNESS:  Sandy Goodwin 50 y.o. female is here because of recent diagnosis of invasive ductal carcinoma of the left breast. Screening mammogram on 06/17/20 showed right breast calcifications. Diagnostic mammogram and [place] on 06/26/20 showed right breast calcifications spanning 4.1cm and no abnormal right axillary lymph nodes. Biopsy on 07/09/20 showed invasive ductal carcinoma, grade 3, with high grade DCIS, HER-2 positive (3+), ER+ 80%, PR+ 40%, Ki67 25%. She presents to the clinic today for initial evaluation and discussion of treatment options.   I reviewed her records extensively and collaborated the history with the patient.  SUMMARY OF ONCOLOGIC HISTORY: Oncology History  Malignant neoplasm of upper-outer quadrant of right breast in female, estrogen receptor positive ([place])  07/08/2020 Initial Diagnosis   Screening mammogram showed right breast calcifications. Diagnostic mammogram showed right breast calcifications spanning 4.1cm and no abnormal right axillary lymph nodes. 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     MEDICAL HISTORY:  Past Medical History:  Diagnosis Date  . Arthritis   . Breast cancer ([place])   . Complication of anesthesia    Itching  . Dysrhythmia    Tachycardia  . Hypothyroidism     SURGICAL HISTORY: Past Surgical History:  Procedure Laterality Date  . APPENDECTOMY  2008  . Bowel Rupture  Repair  2008   Ruptured during appendectomy  . Breast Biopsy Right 1999  . COLONOSCOPY N/A 06/15/2016   Procedure: COLONOSCOPY;  Surgeon: Rogene Houston, MD;  Location: [facility];  Service: Endoscopy;  Laterality: N/A;  845  . IBF  [id number]  . THYROIDECTOMY, PARTIAL Bilateral 2012    SOCIAL HISTORY: Social History   Socioeconomic History  . Marital status: Married    Spouse name: Not on file  . Number of children: Not on file  . Years of education: Not on file  . Highest education level: Not on file  Occupational History  . Not on file  Tobacco Use  . Smoking status: Never Smoker  . Smokeless tobacco: Never Used  Substance and Sexual Activity  . Alcohol use: No  . Drug use: No  . Sexual activity: Yes  Other Topics Concern  . Not on file  Social History Narrative  . Not on file   Social Determinants of Health   Financial Resource Strain: Not on file  Food Insecurity: Not on file  Transportation Needs: Not on file  Physical Activity: Not on file  Stress: Not on file  Social Connections: Not on file  Intimate Partner Violence: Not on file    FAMILY HISTORY: Family History  Problem Relation Age of Onset  . Hypertension Mother   . Breast cancer Mother   . Lymphoma Mother   . Hypertension Brother   . Breast cancer Maternal Aunt   . Lymphoma Maternal Grandfather   . Breast cancer Maternal Aunt     ALLERGIES:  is allergic to codeine and hydrocodone.  MEDICATIONS:  Current Outpatient Medications  Medication Sig Dispense Refill  . BYSTOLIC 5 MG  tablet Take 1 tablet by mouth daily.  1  . cetirizine (ZYRTEC) 10 MG tablet Take 10 mg by mouth daily.    . clobetasol ointment (TEMOVATE) 0.05 % Apply topically 2 (two) times daily.    Marland Kitchen doxycycline (VIBRA-TABS) 100 MG tablet Take 100 mg by mouth 2 (two) times daily.    Marland Kitchen levothyroxine (SYNTHROID, LEVOTHROID) 50 MCG tablet Take 50 mcg by mouth daily before breakfast.    . LORazepam (ATIVAN) 0.5 MG tablet Take 0.5 mg  by mouth daily as needed.    . meloxicam (MOBIC) 15 MG tablet Take 15 mg by mouth daily.    . metroNIDAZOLE (METROCREAM) 0.75 % cream Apply 1 application topically 2 (two) times daily.    Marland Kitchen triamcinolone (NASACORT ALLERGY 24HR) 55 MCG/ACT AERO nasal inhaler Place 2 sprays into the nose daily.    . Turmeric 500 MG CAPS Take by mouth.     No current facility-administered medications for this visit.    REVIEW OF SYSTEMS:   All other systems were reviewed with the patient and are negative.  PHYSICAL EXAMINATION: ECOG PERFORMANCE STATUS: 1 - Symptomatic but completely ambulatory  Vitals:   07/17/20 1303  BP: 121/68  Pulse: 80  Resp: 18  Temp: 97.7 F (36.5 C)  SpO2: 100%   Filed Weights   07/17/20 1303  Weight: 202 lb 1.6 oz (91.7 kg)     LABORATORY DATA:  I have reviewed the data as listed Lab Results  Component Value Date   WBC 7.2 07/17/2020   HGB 13.4 07/17/2020   HCT 40.1 07/17/2020   MCV 89.1 07/17/2020   PLT 231 07/17/2020   Lab Results  Component Value Date   NA 139 07/17/2020   K 3.7 07/17/2020   CL 102 07/17/2020   CO2 26 07/17/2020    RADIOGRAPHIC STUDIES: I have personally reviewed the radiological reports and agreed with the findings in the report.  ASSESSMENT AND PLAN:  Malignant neoplasm of upper-outer quadrant of right breast in female, estrogen receptor positive ([place]) 07/08/2020: Screening mammogram showed right breast calcifications. Diagnostic mammogram showed right breast calcifications spanning 4.1cm and no abnormal right axillary lymph nodes. 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  Pathology and radiology counseling: Discussed with the patient, the details of pathology including the type of breast cancer,the clinical staging, the significance of ER, PR and HER-2/neu receptors and the implications for treatment. After reviewing the pathology in detail, we proceeded  to discuss the different treatment options between surgery, radiation, chemotherapy, antiestrogen therapies.  Recommendation based on multidisciplinary tumor board: 1. Neoadjuvant chemotherapy with TCH Perjeta 6 cycles followed by Herceptin Perjeta maintenance versus Kadcyla maintenance (based on response to neoadjuvant chemo) for 1 year 2. Followed by breast conserving surgery if possible with sentinel lymph node study 3. 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  Chemotherapy Counseling: I discussed the risks and benefits of chemotherapy including the risks of nausea/ vomiting, risk of infection from low WBC count, fatigue due to chemo or anemia, bruising or bleeding due to low platelets, mouth sores, loss/ change in taste and decreased appetite. Liver and kidney function will be monitored through out chemotherapy as abnormalities in liver and kidney function may be a side effect of treatment. Cardiac dysfunction due to Herceptin and Perjeta and neuropathy risk from Taxotere were discussed in detail.  Risk of permanent bone marrow dysfunction due to chemo were also discussed.  Plan: 1. Port placement 2. Echocardiogram 3. Chemotherapy class  She is thinking of working while going through chemo using intermittent FMLA.  Patient had genetics done in 2019: Negative  [facility]CC 16070: Treatment of refractory nausea.  After first cycle of chemo if patient experience chemo induced nausea and vomiting the randomized from cycle 2 to Aloxi plus Dex plus olanzapine or placebo plus Compazine or placebo plus placebo prior to chemo and take home medications for day 2 today for of Dex plus olanzapine or placebo and Compazine or placebo every 8 hours.  If patient does not have nausea after cycle 1, then the trial is complete.  Return to clinic in 2 weeks to start chemotherapy.    All questions were  answered. The patient knows to call the clinic with any problems, questions or concerns.   Rulon Eisenmenger, MD, MPH 07/17/2020    I, Molly Dorshimer, am acting as scribe for Nicholas Lose, MD.  I have reviewed the above documentation for accuracy and completeness, and I agree with the above.

## 2020-07-17 ENCOUNTER — Inpatient Hospital Stay: Payer: BC Managed Care – PPO | Attending: Hematology and Oncology | Admitting: Hematology and Oncology

## 2020-07-17 ENCOUNTER — Ambulatory Visit: Payer: BC Managed Care – PPO | Attending: General Surgery | Admitting: Physical Therapy

## 2020-07-17 ENCOUNTER — Other Ambulatory Visit: Payer: Self-pay | Admitting: General Surgery

## 2020-07-17 ENCOUNTER — Encounter: Payer: Self-pay | Admitting: Genetic Counselor

## 2020-07-17 ENCOUNTER — Inpatient Hospital Stay: Payer: BC Managed Care – PPO | Admitting: Emergency Medicine

## 2020-07-17 ENCOUNTER — Ambulatory Visit
Admission: RE | Admit: 2020-07-17 | Discharge: 2020-07-17 | Disposition: A | Discharge status: Home / Self Care | Payer: BC Managed Care – PPO | Source: Ambulatory Visit | Attending: Radiation Oncology | Admitting: Radiation Oncology

## 2020-07-17 ENCOUNTER — Encounter: Payer: Self-pay | Admitting: Physical Therapy

## 2020-07-17 ENCOUNTER — Encounter: Payer: BC Managed Care – PPO | Admitting: Genetic Counselor

## 2020-07-17 ENCOUNTER — Inpatient Hospital Stay: Payer: BC Managed Care – PPO

## 2020-07-17 ENCOUNTER — Other Ambulatory Visit: Payer: Self-pay

## 2020-07-17 ENCOUNTER — Encounter: Payer: Self-pay | Admitting: Hematology and Oncology

## 2020-07-17 ENCOUNTER — Encounter: Payer: Self-pay | Admitting: Emergency Medicine

## 2020-07-17 VITALS — BP 121/68 | HR 80 | Temp 97.7°F | Resp 18 | Ht 66.5 in | Wt 202.1 lb

## 2020-07-17 DIAGNOSIS — R300 Dysuria: Secondary | ICD-10-CM | POA: Insufficient documentation

## 2020-07-17 DIAGNOSIS — C50411 Malignant neoplasm of upper-outer quadrant of right female breast: Secondary | ICD-10-CM

## 2020-07-17 DIAGNOSIS — E860 Dehydration: Secondary | ICD-10-CM | POA: Diagnosis not present

## 2020-07-17 DIAGNOSIS — Z170 Estrogen receptor positive status [ER+]: Secondary | ICD-10-CM

## 2020-07-17 DIAGNOSIS — R293 Abnormal posture: Secondary | ICD-10-CM | POA: Insufficient documentation

## 2020-07-17 DIAGNOSIS — Z1379 Encounter for other screening for genetic and chromosomal anomalies: Secondary | ICD-10-CM | POA: Insufficient documentation

## 2020-07-17 DIAGNOSIS — K1231 Oral mucositis (ulcerative) due to antineoplastic therapy: Secondary | ICD-10-CM | POA: Insufficient documentation

## 2020-07-17 DIAGNOSIS — Z5189 Encounter for other specified aftercare: Secondary | ICD-10-CM | POA: Insufficient documentation

## 2020-07-17 DIAGNOSIS — R197 Diarrhea, unspecified: Secondary | ICD-10-CM | POA: Insufficient documentation

## 2020-07-17 DIAGNOSIS — E039 Hypothyroidism, unspecified: Secondary | ICD-10-CM | POA: Diagnosis not present

## 2020-07-17 LAB — CMP (CANCER CENTER ONLY)
ALT: 37 U/L (ref 0–44)
AST: 21 U/L (ref 15–41)
Albumin: 4 g/dL (ref 3.5–5.0)
Alkaline Phosphatase: 81 U/L (ref 38–126)
Anion gap: 11 (ref 5–15)
BUN: 10 mg/dL (ref 6–20)
CO2: 26 mmol/L (ref 22–32)
Calcium: 8.5 mg/dL — ABNORMAL LOW (ref 8.9–10.3)
Chloride: 102 mmol/L (ref 98–111)
Creatinine: 0.78 mg/dL (ref 0.44–1.00)
GFR, Estimated: >60 mL/min (ref >60)
Glucose, Bld: 143 mg/dL — ABNORMAL HIGH (ref 70–99)
Potassium: 3.7 mmol/L (ref 3.5–5.1)
Sodium: 139 mmol/L (ref 135–145)
Total Bilirubin: 0.4 mg/dL (ref 0.3–1.2)
Total Protein: 7.2 g/dL (ref 6.5–8.1)

## 2020-07-17 LAB — CBC WITH DIFFERENTIAL (CANCER CENTER ONLY)
Abs Immature Granulocytes: 0.02 10*3/uL (ref 0.00–0.07)
Basophils Absolute: 0 10*3/uL (ref 0.0–0.1)
Basophils Relative: 0 %
Eosinophils Absolute: 0.1 10*3/uL (ref 0.0–0.5)
Eosinophils Relative: 1 %
HCT: 40.1 % (ref 36.0–46.0)
Hemoglobin: 13.4 g/dL (ref 12.0–15.0)
Immature Granulocytes: 0 %
Lymphocytes Relative: 23 %
Lymphs Abs: 1.6 10*3/uL (ref 0.7–4.0)
MCH: 29.8 pg (ref 26.0–34.0)
MCHC: 33.4 g/dL (ref 30.0–36.0)
MCV: 89.1 fL (ref 80.0–100.0)
Monocytes Absolute: 0.3 10*3/uL (ref 0.1–1.0)
Monocytes Relative: 4 %
Neutro Abs: 5.2 10*3/uL (ref 1.7–7.7)
Neutrophils Relative %: 72 %
Platelet Count: 231 10*3/uL (ref 150–400)
RBC: 4.5 MIL/uL (ref 3.87–5.11)
RDW: 13.5 % (ref 11.5–15.5)
WBC Count: 7.2 10*3/uL (ref 4.0–10.5)
nRBC: 0 % (ref 0.0–0.2)

## 2020-07-17 LAB — GENETIC SCREENING ORDER

## 2020-07-17 MED ORDER — DEXAMETHASONE 4 MG PO TABS: 4.0000 mg | ORAL_TABLET | Freq: Every day | ORAL | 0 refills | Status: DC

## 2020-07-17 MED ORDER — PROCHLORPERAZINE MALEATE 10 MG PO TABS: 10.0000 mg | ORAL_TABLET | Freq: Four times a day (QID) | ORAL | 1 refills | Status: DC | PRN

## 2020-07-17 MED ORDER — LIDOCAINE-PRILOCAINE 2.5-2.5 % EX CREA: TOPICAL_CREAM | CUTANEOUS | 3 refills | Status: DC

## 2020-07-17 MED ORDER — ONDANSETRON HCL 8 MG PO TABS: 8.0000 mg | ORAL_TABLET | Freq: Two times a day (BID) | ORAL | 1 refills | Status: DC | PRN

## 2020-07-17 NOTE — Therapy (Signed)
[place], [place], [address] Phone: [phone]   Fax:  [phone]  Physical Therapy Evaluation  Patient Details  Name: Sandy Goodwin MRN: [id number] Date of Birth: 03/05/1971 Referring Provider (PT): Dr. Rolm Bookbinder   Encounter Date: 07/17/2020   PT End of Session - 07/17/20 1409    Visit Number 1    Number of Visits 2    Date for PT Re-Evaluation 01/16/21    PT Start Time 2778    PT Stop Time 1406   Also saw pt from 1428 to 1446 for a total of 32 minutes   PT Time Calculation (min) 14 min    Activity Tolerance Patient tolerated treatment well    Behavior During Therapy [facility] for tasks assessed/performed           Past Medical History:  Diagnosis Date  . Arthritis   . Breast cancer ([place])   . Complication of anesthesia    Itching  . Dysrhythmia    Tachycardia  . Hypothyroidism     Past Surgical History:  Procedure Laterality Date  . APPENDECTOMY  2008  . Bowel Rupture Repair  2008   Ruptured during appendectomy  . Breast Biopsy Right 1999  . COLONOSCOPY N/A 06/15/2016   Procedure: COLONOSCOPY;  Surgeon: Rogene Houston, MD;  Location: [facility];  Service: Endoscopy;  Laterality: N/A;  845  . IBF  [id number]  . THYROIDECTOMY, PARTIAL Bilateral 2012    There were no vitals filed for this visit.    Subjective Assessment - 07/17/20 1342    Subjective Patient reports she is here today to be seen by her medical team for her newly diagnosed righ breast cancer.    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 There are 4.1 cm of calcifications.with a Ki67 of 25%.    Patient Stated Goals Reduce lymphedema risk and learn post op shoulder ROM HEP    Currently in Pain? No/denies              [facility] PT Assessment - 07/17/20 0001      Assessment   Medical  Diagnosis Right breast cancer    Referring Provider (PT) Dr. Rolm Bookbinder    Onset Date/Surgical Date 06/17/20    Hand Dominance Right    Prior Therapy none      Precautions   Precautions Other (comment)    Precaution Comments active cancer      Restrictions   Weight Bearing Restrictions No      Balance Screen   Has the patient fallen in the past 6 months No    Has the patient had a decrease in activity level because of a fear of falling?  No    Is the patient reluctant to leave their home because of a fear of falling?  No      Home Environment   Living Environment Private residence    Living Arrangements Spouse/significant other;Children   Husband, 16 and 35 y.o. daughters   Available Help at Discharge Family      Prior Function   Level of [place] Part time employment    Vocation [employer]    Leisure She walks or does the elliptical daily for 30-45 min      Cognition   Overall Cognitive Status Within Functional Limits for tasks assessed  Posture/Postural Control   Posture/Postural Control Postural limitations    Postural Limitations Rounded Shoulders;Forward head      ROM / Strength   AROM / PROM / Strength AROM;Strength      AROM   AROM Assessment Site Shoulder    Right/Left Shoulder Right;Left    Right Shoulder Extension 56 Degrees    Right Shoulder Flexion 165 Degrees    Right Shoulder ABduction 160 Degrees    Right Shoulder Internal Rotation 85 Degrees    Right Shoulder External Rotation 90 Degrees    Left Shoulder Extension 46 Degrees    Left Shoulder Flexion 150 Degrees    Left Shoulder ABduction 159 Degrees    Left Shoulder Internal Rotation 74 Degrees    Left Shoulder External Rotation 89 Degrees      Strength   Overall Strength Within functional limits for tasks performed             LYMPHEDEMA/ONCOLOGY QUESTIONNAIRE - 07/17/20 0001      Type   Cancer Type Right breast cancer      Lymphedema  Assessments   Lymphedema Assessments Upper extremities      Right Upper Extremity Lymphedema   10 cm Proximal to Olecranon Process 30.4 cm    Olecranon Process 25.2 cm    10 cm Proximal to Ulnar Styloid Process 23.6 cm    Just Proximal to Ulnar Styloid Process 17.2 cm    Across Hand at [employer] 18.2 cm    At [place] of 2nd Digit 6.3 cm      Left Upper Extremity Lymphedema   10 cm Proximal to Olecranon Process 31.9 cm    Olecranon Process 27.1 cm    10 cm Proximal to Ulnar Styloid Process 23.4 cm    Just Proximal to Ulnar Styloid Process 17.2 cm    Across Hand at [employer] 18.9 cm    At [place] of 2nd Digit 6.5 cm           L-DEX FLOWSHEETS - 07/17/20 1300      L-DEX LYMPHEDEMA SCREENING   Measurement Type Unilateral    L-DEX MEASUREMENT EXTREMITY Upper Extremity    POSITION  Standing    DOMINANT SIDE Right    At Risk Side Right    BASELINE SCORE (UNILATERAL) -3            The patient was assessed using the L-Dex machine today to produce a lymphedema index baseline score. The patient will be reassessed on a regular basis (typically every 3 months) to obtain new L-Dex scores. If the score is > 6.5 points away from his/her baseline score indicating onset of subclinical lymphedema, it will be recommended to wear a compression garment for 4 weeks, 12 hours per day and then be reassessed. If the score continues to be > 6.5 points from baseline at reassessment, we will initiate lymphedema treatment. Assessing in this manner has a 95% rate of preventing clinically significant lymphedema.     Katina Dung - 07/17/20 0001    Open a tight or new jar Mild difficulty    Do heavy household chores (wash walls, wash floors) No difficulty    Carry a shopping bag or briefcase No difficulty    Wash your back No difficulty    Use a knife to cut food No difficulty    Recreational activities in which you take some force or impact through your arm, shoulder, or hand (golf, hammering,  tennis) No difficulty  During the past week, to what extent has your arm, shoulder or hand problem interfered with your normal social activities with family, friends, neighbors, or groups? Not at all    During the past week, to what extent has your arm, shoulder or hand problem limited your work or other regular daily activities Not at all    Arm, shoulder, or hand pain. None    Tingling (pins and needles) in your arm, shoulder, or hand None    Difficulty Sleeping No difficulty    DASH Score 2.27 %            Objective measurements completed on examination: See above findings.          Patient was instructed today in a home exercise program today for post op shoulder range of motion. These included active assist shoulder flexion in sitting, scapular retraction, wall walking with shoulder abduction, and hands behind head external rotation.  She was encouraged to do these twice a day, holding 3 seconds and repeating 5 times when permitted by her physician.         PT Education - 07/17/20 1352    Education Details Lymphedema risk reduction and post op shoulder ROM HEP    Person(s) Educated Patient;Spouse    Methods Explanation;Demonstration;Handout    Comprehension Verbalized understanding;Returned demonstration               PT Long Term Goals - 07/17/20 1429      PT LONG TERM GOAL #1   Title Patient will demonstrate she has regained full shoulder ROM and function post operatively compared to baselines.    Time 6    Period Months    Status New    Target Date 01/16/21           Breast Clinic Goals - 07/17/20 1423      Patient will be able to verbalize understanding of pertinent lymphedema risk reduction practices relevant to her diagnosis specifically related to skin care.   Time 1    Period Days    Status Achieved      Patient will be able to return demonstrate and/or verbalize understanding of the post-op home exercise program related to regaining  shoulder range of motion.   Time 1    Period Days    Status Achieved      Patient will be able to verbalize understanding of the importance of attending the postoperative After Breast Cancer Class for further lymphedema risk reduction education and therapeutic exercise.   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 There are 4.1 cm of calcifications.with a Ki67 of 25%. Her multidisciplinary medical team met prior ot her assessments to determine a recommended treatment plan. She is planning to have neoadjuvant chemotherapy followed by a right lumpectomy and sentinel node biopsy followed by radiation and anti-estrogen therapy. She will benefit from a post op PT reassessment to determine needs and from L-Dex screens every 3 months for 2 years to detect subclinical lympedema.    Stability/Clinical Decision Making Stable/Uncomplicated    Clinical Decision Making Low    Rehab Potential Excellent    PT Frequency --   Eval and 1 f/u visit   PT Treatment/Interventions ADLs/Self Care Home Management;Therapeutic  exercise;Patient/family education    PT Next Visit Plan Will reassess 3-4 weeks post op    PT Home Exercise Plan Post op shoulder ROM HEP    Consulted and Agree with Plan of Care Patient;Family member/caregiver    Family Member Consulted Husband           Patient will benefit from skilled therapeutic intervention in order to improve the following deficits and impairments:  Postural dysfunction,Decreased range of motion,Decreased knowledge of precautions,Impaired UE functional use,Pain  Visit Diagnosis: Malignant neoplasm of upper-outer quadrant of right breast in female, estrogen receptor positive ([place]) - Plan: PT plan of care cert/re-cert  Abnormal posture - Plan: PT plan of care cert/re-cert    Patient will follow up at outpatient cancer rehab 3-4 weeks following surgery.  If the patient requires physical therapy at that time, a specific plan will be dictated and sent to the referring physician for approval. The patient was educated today on appropriate basic range of motion exercises to begin post operatively and the importance of attending the After Breast Cancer class following surgery.  Patient was educated today on lymphedema risk reduction practices as it pertains to recommendations that will benefit the patient immediately following surgery.  She verbalized good understanding.      Problem List Patient Active Problem List   Diagnosis Date Noted  . Genetic testing 07/17/2020  . Malignant neoplasm of upper-outer quadrant of right breast in female, estrogen receptor positive ([place]) 07/12/2020  . Change in bowel habits 06/12/2016   Annia Friendly, PT 07/17/20 2:56 PM  [place], [place], [address] Phone: [phone]   Fax:  [phone]  Name: Sandy Goodwin MRN: [id number] Date of Birth: 1971/01/20

## 2020-07-17 NOTE — Research (Signed)
Trial: [id number] - TREATMENT OF REFRACTORY NAUSEA  Patient Sandy Goodwin was identified by MD Lindi Adie as a potential candidate for the above listed study.  This Clinical Research Nurse along with Clinical Research Coordinator Clabe Seal met with Sandy Goodwin, [id number], on 07/17/20 in a manner and location that ensures patient privacy to discuss participation in the above listed research study.  Patient is Accompanied by spouse Event organiser.  A copy of the informed consent document was provided to the patient.  This Research Nurse left HIPAA form in office and offered to get a copy for the patient, however patient declined to wait for form.  Discussed with patient that I will provide HIPAA form to her at next visit if she chooses to proceed with [place] and will give her time to review it before signing consents.  Pt agreed to this plan.  Patient reads, speaks, and understands [place].   Patient was provided with the business card of this Nurse and encouraged to contact the research team with any questions.  Approximately 15 minutes were spent with the patient reviewing the informed consent documents.  Patient was provided the option of taking informed consent documents home to review and was encouraged to review at their convenience with their support network, including other care providers. Patient took the consent documents home to review.   This nurse to f/u with patient via phone in the next few days to check on her interest in [facility] study enrollment.  DCP-001 study was not discussed with patient at this time, will f/u on possible interest/enrollment after determining nausea study interest.  Wells Guiles 'Learta CoddingFaith Rogue, RN, BSN Clinical Research Nurse 07/17/2020, 16:35

## 2020-07-17 NOTE — Assessment & Plan Note (Signed)
07/08/2020: Screening mammogram showed right breast calcifications. Diagnostic mammogram showed right breast calcifications spanning 4.1cm and no abnormal right axillary lymph nodes. 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  Pathology and radiology counseling: Discussed with the patient, the details of pathology including the type of breast cancer,the clinical staging, the significance of ER, PR and HER-2/neu receptors and the implications for treatment. After reviewing the pathology in detail, we proceeded to discuss the different treatment options between surgery, radiation, chemotherapy, antiestrogen therapies.  Recommendation based on multidisciplinary tumor board: 1. Neoadjuvant chemotherapy with TCH Perjeta 6 cycles followed by Herceptin Perjeta maintenance versus Kadcyla maintenance (based on response to neoadjuvant chemo) for 1 year 2. Followed by breast conserving surgery if possible with sentinel lymph node study 3. Followed by adjuvant radiation therapy if patient had lumpectomy  Chemotherapy Counseling: I discussed the risks and benefits of chemotherapy including the risks of nausea/ vomiting, risk of infection from low WBC count, fatigue due to chemo or anemia, bruising or bleeding due to low platelets, mouth sores, loss/ change in taste and decreased appetite. Liver and kidney function will be monitored through out chemotherapy as abnormalities in liver and kidney function may be a side effect of treatment. Cardiac dysfunction due to Herceptin and Perjeta and neuropathy risk from Taxotere were discussed in detail. Risk of permanent bone marrow dysfunction due to chemo were also discussed.  Plan: 1. Port placement 2. Echocardiogram 3. Chemotherapy class 4. Breast MRI 5.  Genetics  [facility] [phone]: Treatment of refractory nausea.  After first cycle of chemo if patient experience chemo induced nausea and vomiting  the randomized from cycle 2 to Aloxi plus Dex plus olanzapine or placebo plus Compazine or placebo plus placebo prior to chemo and take home medications for day 2 today for of Dex plus olanzapine or placebo and Compazine or placebo every 8 hours.  If patient does not have nausea after cycle 1, then the trial is complete.  Return to clinic in 2 weeks to start chemotherapy.

## 2020-07-17 NOTE — Progress Notes (Signed)
Radiation Oncology         [phone] ________________________________  Name: Sandy Goodwin        MRN: [id number]  Date of Service: 07/17/2020 DOB: July 01, 1970  NO:MVEHMC, Mitzie Na, MD  Rolm Bookbinder, MD     REFERRING PHYSICIAN: Rolm Bookbinder, MD   DIAGNOSIS: The encounter diagnosis was Malignant neoplasm of upper-outer quadrant of right breast in female, estrogen receptor positive ([place]).   HISTORY OF PRESENT ILLNESS: Sandy Goodwin is a 50 y.o. female seen in the multidisciplinary breast clinic for a new diagnosis of right breast cancer.  The patient was found to have screening detected calcifications in the right breast as well as a left breast distortion.  She had diagnostic work-up which showed a 4.1 cm grouping of calcifications in the right breast and her axilla was negative for any adenopathy.  The left distortion resolved, and she underwent MRI scan that showed a concern for calcifications within the area of non-mass enhancement measuring 4.1 cm.  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 She's seen today to discuss treatment recommendations for her cancer.   PREVIOUS RADIATION THERAPY: No   PAST MEDICAL HISTORY:  Past Medical History:  Diagnosis Date  . Arthritis   . Breast cancer ([place])   . Complication of anesthesia    Itching  . Dysrhythmia    Tachycardia  . Hypothyroidism        PAST SURGICAL HISTORY: Past Surgical History:  Procedure Laterality Date  . APPENDECTOMY  2008  . Bowel Rupture Repair  2008   Ruptured during appendectomy  . Breast Biopsy Right 1999  . COLONOSCOPY N/A 06/15/2016   Procedure: COLONOSCOPY;  Surgeon: Rogene Houston, MD;  Location: [facility];  Service: Endoscopy;  Laterality: N/A;  845  . IBF  [id number]  . THYROIDECTOMY, PARTIAL Bilateral 2012     FAMILY  HISTORY:  Family History  Problem Relation Age of Onset  . Hypertension Mother   . Breast cancer Mother   . Lymphoma Mother   . Hypertension Brother   . Breast cancer Maternal Aunt   . Lymphoma Maternal Grandfather   . Breast cancer Maternal Aunt      SOCIAL HISTORY:  reports that she has never smoked. She has never used smokeless tobacco. She reports that she does not drink alcohol and does not use drugs.  The patient is married and lives in [place].  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 They have two young daughters.    ALLERGIES: Codeine and Hydrocodone   MEDICATIONS:  Current Outpatient Medications  Medication Sig Dispense Refill  . BYSTOLIC 5 MG tablet Take 1 tablet by mouth daily.  1  . cetirizine (ZYRTEC) 10 MG tablet Take 10 mg by mouth daily.    . clobetasol ointment (TEMOVATE) 0.05 % Apply topically 2 (two) times daily.    Marland Kitchen dexamethasone (DECADRON) 4 MG tablet Take 1 tablet (4 mg total) by mouth daily. Take 1 tablet day before chemo and 1 tablet day after chemo with food 12 tablet 0  . doxycycline (VIBRA-TABS) 100 MG tablet Take 100 mg by mouth 2 (two) times daily.    Marland Kitchen levothyroxine (SYNTHROID, LEVOTHROID) 50 MCG tablet Take 50 mcg by mouth daily before breakfast.    . lidocaine-prilocaine (EMLA) cream Apply to affected  area once 30 g 3  . LORazepam (ATIVAN) 0.5 MG tablet Take 0.5 mg by mouth daily as needed.    . meloxicam (MOBIC) 15 MG tablet Take 15 mg by mouth daily.    . metroNIDAZOLE (METROCREAM) 0.75 % cream Apply 1 application topically 2 (two) times daily.    . ondansetron (ZOFRAN) 8 MG tablet Take 1 tablet (8 mg total) by mouth 2 (two) times daily as needed (Nausea or vomiting). Start on the third day after chemotherapy. 30 tablet 1  . prochlorperazine (COMPAZINE) 10 MG tablet Take 1 tablet (10 mg total) by mouth every 6 (six) hours as needed (Nausea or vomiting). 30 tablet 1  . triamcinolone (NASACORT ALLERGY 24HR) 55 MCG/ACT AERO nasal  inhaler Place 2 sprays into the nose daily.    . Turmeric 500 MG CAPS Take by mouth.     No current facility-administered medications for this encounter.     REVIEW OF SYSTEMS: On review of systems, the patient reports that she is doing well overall. She reports a history of a positive RA factor but does not have a known diagnosis of rheumatoid arthritis. She does however have some joint aches at times as well as occasional issues with heartburn and if so occasional episodes of nausea. No other complaints are noted.      PHYSICAL EXAM:  Wt Readings from Last 3 Encounters:  07/17/20 202 lb 1.6 oz (91.7 kg)  06/15/16 185 lb (83.9 kg)   Temp Readings from Last 3 Encounters:  07/17/20 97.7 F (36.5 C) (Temporal)  06/15/16 97.4 F (36.3 C) (Oral)   BP Readings from Last 3 Encounters:  07/17/20 121/68  06/15/16 105/67   Pulse Readings from Last 3 Encounters:  07/17/20 80  06/15/16 63    In general this is a well appearing Caucasian female in no acute distress. She's alert and oriented x4 and appropriate throughout the examination. Cardiopulmonary assessment is negative for acute distress and she exhibits normal effort. Bilateral breast exam is deferred.    ECOG = 0  0 - Asymptomatic (Fully active, able to carry on all predisease activities without restriction)  1 - Symptomatic but completely ambulatory (Restricted in physically strenuous activity but ambulatory and able to carry out work of a light or sedentary nature. For example, light housework, office work)  2 - Symptomatic, <50% in bed during the day (Ambulatory and capable of all self care but unable to carry out any work activities. Up and about more than 50% of waking hours)  3 - Symptomatic, >50% in bed, but not bedbound (Capable of only limited self-care, confined to bed or chair 50% or more of waking hours)  4 - Bedbound (Completely disabled. Cannot carry on any self-care. Totally confined to bed or chair)  5 -  Death   Santiago Glad MM, Creech RH, Tormey DC, et al. [phone]). "Toxicity and response criteria of the [facility]". Am. Evlyn Clines. Oncol. 5 (6): 649-55    LABORATORY DATA:  Lab Results  Component Value Date   WBC 7.2 07/17/2020   HGB 13.4 07/17/2020   HCT 40.1 07/17/2020   MCV 89.1 07/17/2020   PLT 231 07/17/2020   Lab Results  Component Value Date   NA 139 07/17/2020   K 3.7 07/17/2020   CL 102 07/17/2020   CO2 26 07/17/2020   Lab Results  Component Value Date   ALT 37 07/17/2020   AST 21 07/17/2020   ALKPHOS 81 07/17/2020   BILITOT 0.4 07/17/2020  RADIOGRAPHY: MR BREAST BILATERAL W WO CONTRAST INC CAD  Result Date: 07/12/2020 CLINICAL DATA:  Family history of breast cancer diagnosed in the patient's mother, maternal aunt, and paternal grandmother. Patient had recent stereotactic guided core biopsy of anterior and posterior extent of suspicious RIGHT breast calcifications in the UPPER-OUTER QUADRANT. 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 LABS:  None obtained at the time of imaging. EXAM: BILATERAL BREAST MRI WITH AND WITHOUT CONTRAST TECHNIQUE: Multiplanar, multisequence MR images of both breasts were obtained prior to and following the intravenous administration of 9 ml of Gadavist Three-dimensional MR images were rendered by post-processing of the original MR data on an independent workstation. The three-dimensional MR images were interpreted, and findings are reported in the following complete MRI report for this study. Three dimensional images were evaluated at the independent interpreting workstation using the DynaCAD thin client. COMPARISON:  07/10/2020 and MRI on 05/29/2019 FINDINGS: Breast composition: c. Heterogeneous fibroglandular tissue. Background parenchymal enhancement: Moderate. Right breast:  Within the UPPER central portion of the RIGHT breast, there is clumped non mass enhancement, slightly greater than background. This area measures 4.1 x 3.6 x 1.4 centimeters. Large marker clip artifact marks the posterior aspect of the area of enhancement, consistent with a coil clip. A small artifact marks the anterior aspect of the enhancement, consistent with an X shaped clip. The area of enhancement correlates well with the distribution of calcifications seen mammographically. No additional areas of concern in the RIGHT breast. Left breast: No mass or abnormal enhancement. Lymph nodes: No abnormal appearing lymph nodes. Ancillary findings:  None IMPRESSION: 1. UPPER central non mass enhancement in the RIGHT breast, spanning 4.1 centimeters and correlating well with the distribution of calcifications seen mammographically. 2. The tissue marker clips mark the anterior and posterior aspects of the enhancement. 3. No new or suspicious findings in the LEFT breast. RECOMMENDATION: Treatment plan for known RIGHT breast malignancy. BI-RADS CATEGORY  6: Known biopsy-proven malignancy. Electronically Signed   By: Nolon Nations M.D.   On: 07/12/2020 13:58   MM CLIP PLACEMENT RIGHT  Result Date: 07/08/2020 CLINICAL DATA:  Evaluate post biopsy marker clip placement following stereotactic core needle biopsy of the anterior and posterior extents of right breast calcifications. EXAM: DIAGNOSTIC RIGHT MAMMOGRAM POST STEREOTACTIC BIOPSY COMPARISON:  Previous exam(s). FINDINGS: Mammographic images were obtained following stereotactic guided biopsy of right breast calcifications. Both biopsy marker clips are well positioned along the anterior and posterior extents of the suspicious upper outer quadrant calcifications. IMPRESSION: Appropriate positioning of the coil and X shaped biopsy clips following stereotactic core needle biopsy of the right breast. Final Assessment: Post Procedure Mammograms for Marker Placement  Electronically Signed   By: Lajean Manes M.D.   On: 07/08/2020 09:31   MM RT BREAST BX W LOC DEV 1ST LESION IMAGE BX SPEC STEREO GUIDE  Addendum Date: 07/09/2020   ADDENDUM REPORT: 07/09/2020 11:29 ADDENDUM: Pathology revealed GRADE III INVASIVE DUCTAL CARCINOMA, HIGH GRADE DUCTAL CARCINOMA IN SITU WITH NECROSIS of the Right breast, upper outer quadrant, posterior. This was found to be concordant by Dr. Lajean Manes. Pathology revealed HIGH GRADE DUCTAL CARCINOMA IN SITU WITH NECROSIS, MICROSCOPIC FOCUS SUSPICIOUS FOR INVASIVE DUCTAL CARCINOMA of the Right breast, upper outer quadrant, anterior. This was found to be concordant by Dr. Lajean Manes. Pathology results were discussed with the patient by telephone. The patient reported doing well after the  biopsies with tenderness at the sites. Post biopsy instructions and care were reviewed and questions were answered. The patient was encouraged to call [facility] for any additional concerns. My direct phone number was provided. The patient was referred to [facility] on July 17, 2020. The patient is scheduled for a Right axillary ultrasound at [place] on July 10, 2020. Consideration for a bilateral breast MRI for further evaluation of extent of disease given the high grade histology, age, and strong family history of breast cancer. Pathology results were discussed with Peyton Bottoms RT-R, at [facility] on July 09, 2020. Pathology results reported by Terie Purser, RN on 07/09/2020. Electronically Signed   By: Lajean Manes M.D.   On: 07/09/2020 11:29   Result Date: 07/09/2020 CLINICAL DATA:  Patient presents for stereotactic core needle biopsy of suspicious right breast calcifications. Two biopsies performed, the anterior and posterior extents of the calcifications. EXAM: RIGHT BREAST STEREOTACTIC CORE NEEDLE BIOPSY: 2  STEREOTACTIC CORE BIOPSIES PERFORMED. COMPARISON:  Previous exams. FINDINGS: The patient and I discussed the procedure of stereotactic-guided biopsy including benefits and alternatives. We discussed the high likelihood of a successful procedure. We discussed the risks of the procedure including infection, bleeding, tissue injury, clip migration, and inadequate sampling. Informed written consent was given. The usual time out protocol was performed immediately prior to the procedure. Biopsy #1: Calcifications: Posterior extent. Using sterile technique and 1% Lidocaine as local anesthetic, under stereotactic guidance, a 9 gauge vacuum assisted device was used to perform core needle biopsy of calcifications in the upper outer quadrant the right breast, posterior extent, using a superior approach. Specimen radiograph was performed showing calcifications for which biopsy was performed. Specimens with calcifications are identified for pathology. Lesion quadrant: Upper outer quadrant At the conclusion of the procedure, coil shaped tissue marker clip was deployed into the biopsy cavity. Biopsy #2: Calcifications: Anterior extent. Using sterile technique and 1% Lidocaine as local anesthetic, under stereotactic guidance, a 9 gauge vacuum assisted device was used to perform core needle biopsy of calcifications in the upper outer quadrant of the right breast, anterior extent, using a superior approach. Specimen radiograph was performed showing calcifications for which biopsy was performed. Specimens with calcifications are identified for pathology. Lesion quadrant: Upper outer quadrant At the conclusion of the procedure, X shaped tissue marker clip was deployed into the biopsy cavity. Follow-up 2-view mammogram was performed and dictated separately. IMPRESSION: Stereotactic-guided biopsy of the anterior and posterior extents of suspicious calcifications in the upper outer right breast. No apparent complications. Electronically  Signed: By: Lajean Manes M.D. On: 07/08/2020 09:23   MM RT BREAST BX W LOC DEV EA AD LESION IMG BX SPEC STEREO GUIDE  Addendum Date: 07/09/2020   ADDENDUM REPORT: 07/09/2020 11:29 ADDENDUM: Pathology revealed GRADE III INVASIVE DUCTAL CARCINOMA, HIGH GRADE DUCTAL CARCINOMA IN SITU WITH NECROSIS of the Right breast, upper outer quadrant, posterior. This was found to be concordant by Dr. Lajean Manes. Pathology revealed HIGH GRADE DUCTAL CARCINOMA IN SITU WITH NECROSIS, MICROSCOPIC FOCUS SUSPICIOUS FOR INVASIVE DUCTAL CARCINOMA of the Right breast, upper outer quadrant, anterior. This was found to be concordant by Dr. Lajean Manes. Pathology results were discussed with the patient by telephone. The patient reported doing well after the biopsies with tenderness at the sites. Post biopsy instructions and care were reviewed and questions were answered. The patient was encouraged to call [facility] for any additional concerns. My direct phone number was provided. The patient was referred to [facility] on July 17, 2020. The patient is scheduled for a Right axillary ultrasound at [facility] on July 10, 2020. Consideration for a bilateral breast MRI for further evaluation of extent of disease given the high grade histology, age, and strong family history of breast cancer. Pathology results were discussed with Arvil Chaco RT-R, at [facility] on July 09, 2020. Pathology results reported by Rene Kocher, RN on 07/09/2020. Electronically Signed   By: Amie Portland M.D.   On: 07/09/2020 11:29   Result Date: 07/09/2020 CLINICAL DATA:  Patient presents for stereotactic core needle biopsy of suspicious right breast calcifications. Two biopsies performed, the anterior and posterior extents of the calcifications. EXAM: RIGHT BREAST STEREOTACTIC CORE NEEDLE BIOPSY: 2 STEREOTACTIC CORE BIOPSIES  PERFORMED. COMPARISON:  Previous exams. FINDINGS: The patient and I discussed the procedure of stereotactic-guided biopsy including benefits and alternatives. We discussed the high likelihood of a successful procedure. We discussed the risks of the procedure including infection, bleeding, tissue injury, clip migration, and inadequate sampling. Informed written consent was given. The usual time out protocol was performed immediately prior to the procedure. Biopsy #1: Calcifications: Posterior extent. Using sterile technique and 1% Lidocaine as local anesthetic, under stereotactic guidance, a 9 gauge vacuum assisted device was used to perform core needle biopsy of calcifications in the upper outer quadrant the right breast, posterior extent, using a superior approach. Specimen radiograph was performed showing calcifications for which biopsy was performed. Specimens with calcifications are identified for pathology. Lesion quadrant: Upper outer quadrant At the conclusion of the procedure, coil shaped tissue marker clip was deployed into the biopsy cavity. Biopsy #2: Calcifications: Anterior extent. Using sterile technique and 1% Lidocaine as local anesthetic, under stereotactic guidance, a 9 gauge vacuum assisted device was used to perform core needle biopsy of calcifications in the upper outer quadrant of the right breast, anterior extent, using a superior approach. Specimen radiograph was performed showing calcifications for which biopsy was performed. Specimens with calcifications are identified for pathology. Lesion quadrant: Upper outer quadrant At the conclusion of the procedure, X shaped tissue marker clip was deployed into the biopsy cavity. Follow-up 2-view mammogram was performed and dictated separately. IMPRESSION: Stereotactic-guided biopsy of the anterior and posterior extents of suspicious calcifications in the upper outer right breast. No apparent complications. Electronically Signed: By: Amie Portland  M.D. On: 07/08/2020 09:23       IMPRESSION/PLAN: 1. Stage IB, cT2N0M0 grade 3, triple positive invasive ductal carcinoma of the right breast. Dr. Mitzi Hansen discusses the pathology findings and reviews the nature of early stage right breast disease. The consensus from the breast conference includes proceeding with neoadjuvant chemotherapy.  Surgery is to be determined but she would be a candidate for either lumpectomy or mastectomy either would also be with sentinel node biopsy per Dr. Lessie Dings.  If she does undergo lumpectomy, she would be a candidate for external radiotherapy to the breast  to reduce risks of local recurrence followed by antiestrogen therapy. We discussed the risks, benefits, short, and long term effects of radiotherapy, as well as the curative intent. Dr. Mitzi Hansen discusses the delivery and logistics of radiotherapy and anticipates a course of 6 1/2 weeks of radiotherapy if she does undergo breast conserving surgery.  We will follow-up with her decision making process and would be happy to see her back following surgical  resection if she has breast conserving surgery. 2. Possible genetic predisposition to malignancy. The patient is a candidate for genetic testing given her personal and family history. She was offered referral and is interested in updating any additional testing that needs updating as she's had prior testing in the past. Genetic counseling will assess this and see her as needed.   In a visit lasting 60 minutes, greater than 50% of the time was spent face to face reviewing her case, as well as in preparation of, discussing, and coordinating the patient's care.  The above documentation reflects my direct findings during this shared patient visit. Please see the separate note by Dr. Lisbeth Renshaw on this date for the remainder of the patient's plan of care.    Carola Rhine, [facility]    **Disclaimer: This note was dictated with voice recognition software. Similar sounding words  can inadvertently be transcribed and this note may contain transcription errors which may not have been corrected upon publication of note.**

## 2020-07-17 NOTE — Progress Notes (Signed)
START ON PATHWAY REGIMEN - Breast     A cycle is every 21 days:     Pertuzumab      Pertuzumab      Trastuzumab-xxxx      Trastuzumab-xxxx      Carboplatin      Docetaxel   **Always confirm dose/schedule in your pharmacy ordering system**  Patient Characteristics: Preoperative or Nonsurgical Candidate (Clinical Staging), Neoadjuvant Therapy followed by Surgery, Invasive Disease, Chemotherapy, HER2 Positive, ER Positive Therapeutic Status: Preoperative or Nonsurgical Candidate (Clinical Staging) AJCC M Category: cM0 AJCC Grade: G3 Breast Surgical Plan: Neoadjuvant Therapy followed by Surgery ER Status: Positive (+) AJCC 8 Stage Grouping: IB HER2 Status: Positive (+) AJCC T Category: cT2 AJCC N Category: cN0 PR Status: Positive (+) Intent of Therapy: Curative Intent, Discussed with Patient 

## 2020-07-17 NOTE — Patient Instructions (Signed)

## 2020-07-18 ENCOUNTER — Telehealth: Payer: Self-pay | Admitting: *Deleted

## 2020-07-18 ENCOUNTER — Encounter (HOSPITAL_BASED_OUTPATIENT_CLINIC_OR_DEPARTMENT_OTHER): Payer: Self-pay | Admitting: General Surgery

## 2020-07-18 NOTE — Telephone Encounter (Signed)
Left vm regarding [facility] from 4.6.22 and new appt date and time for echo and chemo class. Request return call to further discuss.

## 2020-07-18 NOTE — Telephone Encounter (Signed)
Spoke to pt, confirmed echo and chemo class for 4/8 at 8am. Denies questions or concerns regarding dx or treatment care plan.

## 2020-07-18 NOTE — Progress Notes (Signed)
[facility] Psychosocial Distress Screening Counseling Intern  Counseling intern was referred by distress screening protocol.  The patient scored a 6 on the Psychosocial Distress Thermometer which indicates moderate distress. Counseling intern met with patient in exam room" to assess for distress and other psychosocial needs. Intern met with patient and her husband in [facility]. The husband spoke a lot for the patient, who seemed overwhelmed. They reported that they were both feeling better with having a plan, even with shock still present. The waiting had been hard on the patient and now with the answers, she was feeling more at a 3 in terms of distress. Her physical symptoms are also common for her in times of stress. She reported good support and two children (6 and 11) that were handling this well.    ONCBCN DISTRESS SCREENING 07/18/2020  Screening Type Initial Screening  Distress experienced in past week (1-10) 6  Emotional problem type Adjusting to illness  Physical Problem type Nausea/vomiting;Sleep/insomnia;Loss of appetitie  Referral to support programs Yes    Follow up needed: No.  Gaylyn Rong Counseling Intern

## 2020-07-19 ENCOUNTER — Other Ambulatory Visit: Payer: Self-pay

## 2020-07-19 ENCOUNTER — Other Ambulatory Visit (HOSPITAL_COMMUNITY): Payer: BC Managed Care – PPO

## 2020-07-19 ENCOUNTER — Ambulatory Visit (HOSPITAL_COMMUNITY)
Admission: RE | Admit: 2020-07-19 | Discharge: 2020-07-19 | Disposition: A | Discharge status: Home / Self Care | Payer: BC Managed Care – PPO | Source: Ambulatory Visit | Attending: Hematology and Oncology | Admitting: Hematology and Oncology

## 2020-07-19 ENCOUNTER — Telehealth: Payer: Self-pay | Admitting: Emergency Medicine

## 2020-07-19 ENCOUNTER — Inpatient Hospital Stay: Payer: BC Managed Care – PPO

## 2020-07-19 DIAGNOSIS — Z0189 Encounter for other specified special examinations: Secondary | ICD-10-CM | POA: Diagnosis not present

## 2020-07-19 DIAGNOSIS — Z01818 Encounter for other preprocedural examination: Secondary | ICD-10-CM | POA: Insufficient documentation

## 2020-07-19 DIAGNOSIS — Z170 Estrogen receptor positive status [ER+]: Secondary | ICD-10-CM | POA: Diagnosis not present

## 2020-07-19 DIAGNOSIS — C50411 Malignant neoplasm of upper-outer quadrant of right female breast: Secondary | ICD-10-CM | POA: Diagnosis not present

## 2020-07-19 LAB — ECHOCARDIOGRAM COMPLETE
Area-P 1/2: 3.31 cm2
S' Lateral: 3 cm

## 2020-07-19 MED ORDER — ENSURE PRE-SURGERY PO LIQD: 296.0000 mL | Freq: Once | ORAL | Status: DC

## 2020-07-19 NOTE — Telephone Encounter (Signed)
Pt seeing RN Janifer Adie for Chemo Education appt this am.  This Research Nurse gave pt copy of [facility] HIPAA form for her to review.  Will attempt to contact patient Monday (4/11) to determine potential interest in [facility] study.

## 2020-07-19 NOTE — Progress Notes (Signed)
  Echocardiogram 2D Echocardiogram has been performed.  Fidel Levy 07/19/2020, 11:05 AM

## 2020-07-19 NOTE — Progress Notes (Signed)

## 2020-07-22 ENCOUNTER — Other Ambulatory Visit: Payer: Self-pay

## 2020-07-22 ENCOUNTER — Telehealth: Payer: Self-pay | Admitting: Emergency Medicine

## 2020-07-22 ENCOUNTER — Encounter: Payer: Self-pay | Admitting: *Deleted

## 2020-07-22 ENCOUNTER — Telehealth: Payer: Self-pay | Admitting: *Deleted

## 2020-07-22 ENCOUNTER — Encounter (HOSPITAL_COMMUNITY)
Admission: RE | Admit: 2020-07-22 | Discharge: 2020-07-22 | Disposition: A | Discharge status: Home / Self Care | Payer: BC Managed Care – PPO | Source: Ambulatory Visit | Attending: General Surgery | Admitting: General Surgery

## 2020-07-22 ENCOUNTER — Other Ambulatory Visit (HOSPITAL_COMMUNITY)
Admission: RE | Admit: 2020-07-22 | Discharge: 2020-07-22 | Disposition: A | Discharge status: Home / Self Care | Payer: BC Managed Care – PPO | Source: Ambulatory Visit | Attending: General Surgery | Admitting: General Surgery

## 2020-07-22 DIAGNOSIS — Z809 Family history of malignant neoplasm, unspecified: Secondary | ICD-10-CM | POA: Diagnosis not present

## 2020-07-22 DIAGNOSIS — Z20822 Contact with and (suspected) exposure to covid-19: Secondary | ICD-10-CM | POA: Insufficient documentation

## 2020-07-22 DIAGNOSIS — Z8041 Family history of malignant neoplasm of ovary: Secondary | ICD-10-CM | POA: Diagnosis not present

## 2020-07-22 DIAGNOSIS — Z803 Family history of malignant neoplasm of breast: Secondary | ICD-10-CM | POA: Diagnosis not present

## 2020-07-22 DIAGNOSIS — Z01812 Encounter for preprocedural laboratory examination: Secondary | ICD-10-CM | POA: Insufficient documentation

## 2020-07-22 DIAGNOSIS — C50411 Malignant neoplasm of upper-outer quadrant of right female breast: Secondary | ICD-10-CM | POA: Diagnosis present

## 2020-07-22 DIAGNOSIS — Z90711 Acquired absence of uterus with remaining cervical stump: Secondary | ICD-10-CM | POA: Diagnosis not present

## 2020-07-22 LAB — SARS CORONAVIRUS 2 (TAT 6-24 HRS): SARS Coronavirus 2: NEGATIVE

## 2020-07-22 MED ORDER — LORAZEPAM 0.5 MG PO TABS: ORAL_TABLET | ORAL | 0 refills | Status: DC

## 2020-07-22 NOTE — Telephone Encounter (Signed)
Called patient to discuss potential interest in [place] study after reviewing documents.  Pt declined enrollment.  Also discussed DCP-001 which patient also declined.  Pt states she would like to be considered for studies in the future if the need arises.  Pt denies any further questions/concerns at this time.

## 2020-07-22 NOTE — Telephone Encounter (Signed)
Pt called regarding needing pre-auth for EMLA cream. Msg sent to medication authorization specialist for follow up.  Pt has decided to forgo nausea study. Msg sent to research nurse and Dr. Lindi Adie.

## 2020-07-23 ENCOUNTER — Encounter (HOSPITAL_BASED_OUTPATIENT_CLINIC_OR_DEPARTMENT_OTHER): Payer: Self-pay | Admitting: General Surgery

## 2020-07-23 ENCOUNTER — Other Ambulatory Visit: Payer: Self-pay

## 2020-07-23 ENCOUNTER — Ambulatory Visit (HOSPITAL_BASED_OUTPATIENT_CLINIC_OR_DEPARTMENT_OTHER): Payer: BC Managed Care – PPO | Admitting: Certified Registered"

## 2020-07-23 ENCOUNTER — Ambulatory Visit (HOSPITAL_BASED_OUTPATIENT_CLINIC_OR_DEPARTMENT_OTHER)
Admission: RE | Admit: 2020-07-23 | Discharge: 2020-07-23 | Disposition: A | Discharge status: Home / Self Care | Payer: BC Managed Care – PPO | Source: Ambulatory Visit | Attending: General Surgery | Admitting: General Surgery

## 2020-07-23 ENCOUNTER — Ambulatory Visit (HOSPITAL_COMMUNITY): Payer: BC Managed Care – PPO

## 2020-07-23 ENCOUNTER — Encounter (HOSPITAL_BASED_OUTPATIENT_CLINIC_OR_DEPARTMENT_OTHER)
Admission: RE | Disposition: A | Discharge status: Home / Self Care | Payer: Self-pay | Source: Ambulatory Visit | Attending: General Surgery

## 2020-07-23 DIAGNOSIS — Z20822 Contact with and (suspected) exposure to covid-19: Secondary | ICD-10-CM | POA: Insufficient documentation

## 2020-07-23 DIAGNOSIS — C50411 Malignant neoplasm of upper-outer quadrant of right female breast: Secondary | ICD-10-CM | POA: Insufficient documentation

## 2020-07-23 DIAGNOSIS — Z90711 Acquired absence of uterus with remaining cervical stump: Secondary | ICD-10-CM | POA: Insufficient documentation

## 2020-07-23 DIAGNOSIS — Z803 Family history of malignant neoplasm of breast: Secondary | ICD-10-CM | POA: Insufficient documentation

## 2020-07-23 DIAGNOSIS — Z95828 Presence of other vascular implants and grafts: Secondary | ICD-10-CM

## 2020-07-23 DIAGNOSIS — Z8041 Family history of malignant neoplasm of ovary: Secondary | ICD-10-CM | POA: Insufficient documentation

## 2020-07-23 DIAGNOSIS — Z809 Family history of malignant neoplasm, unspecified: Secondary | ICD-10-CM | POA: Insufficient documentation

## 2020-07-23 HISTORY — PX: PORTACATH PLACEMENT: SHX2246

## 2020-07-23 IMAGING — CR DG CHEST 1V PORT
1 series · 1 of 1 positions shown · non-contrast
Comparison: None.

CLINICAL DATA: Status post Port-A-Cath placement

EXAM:
PORTABLE CHEST 1 VIEW

[chest ap]
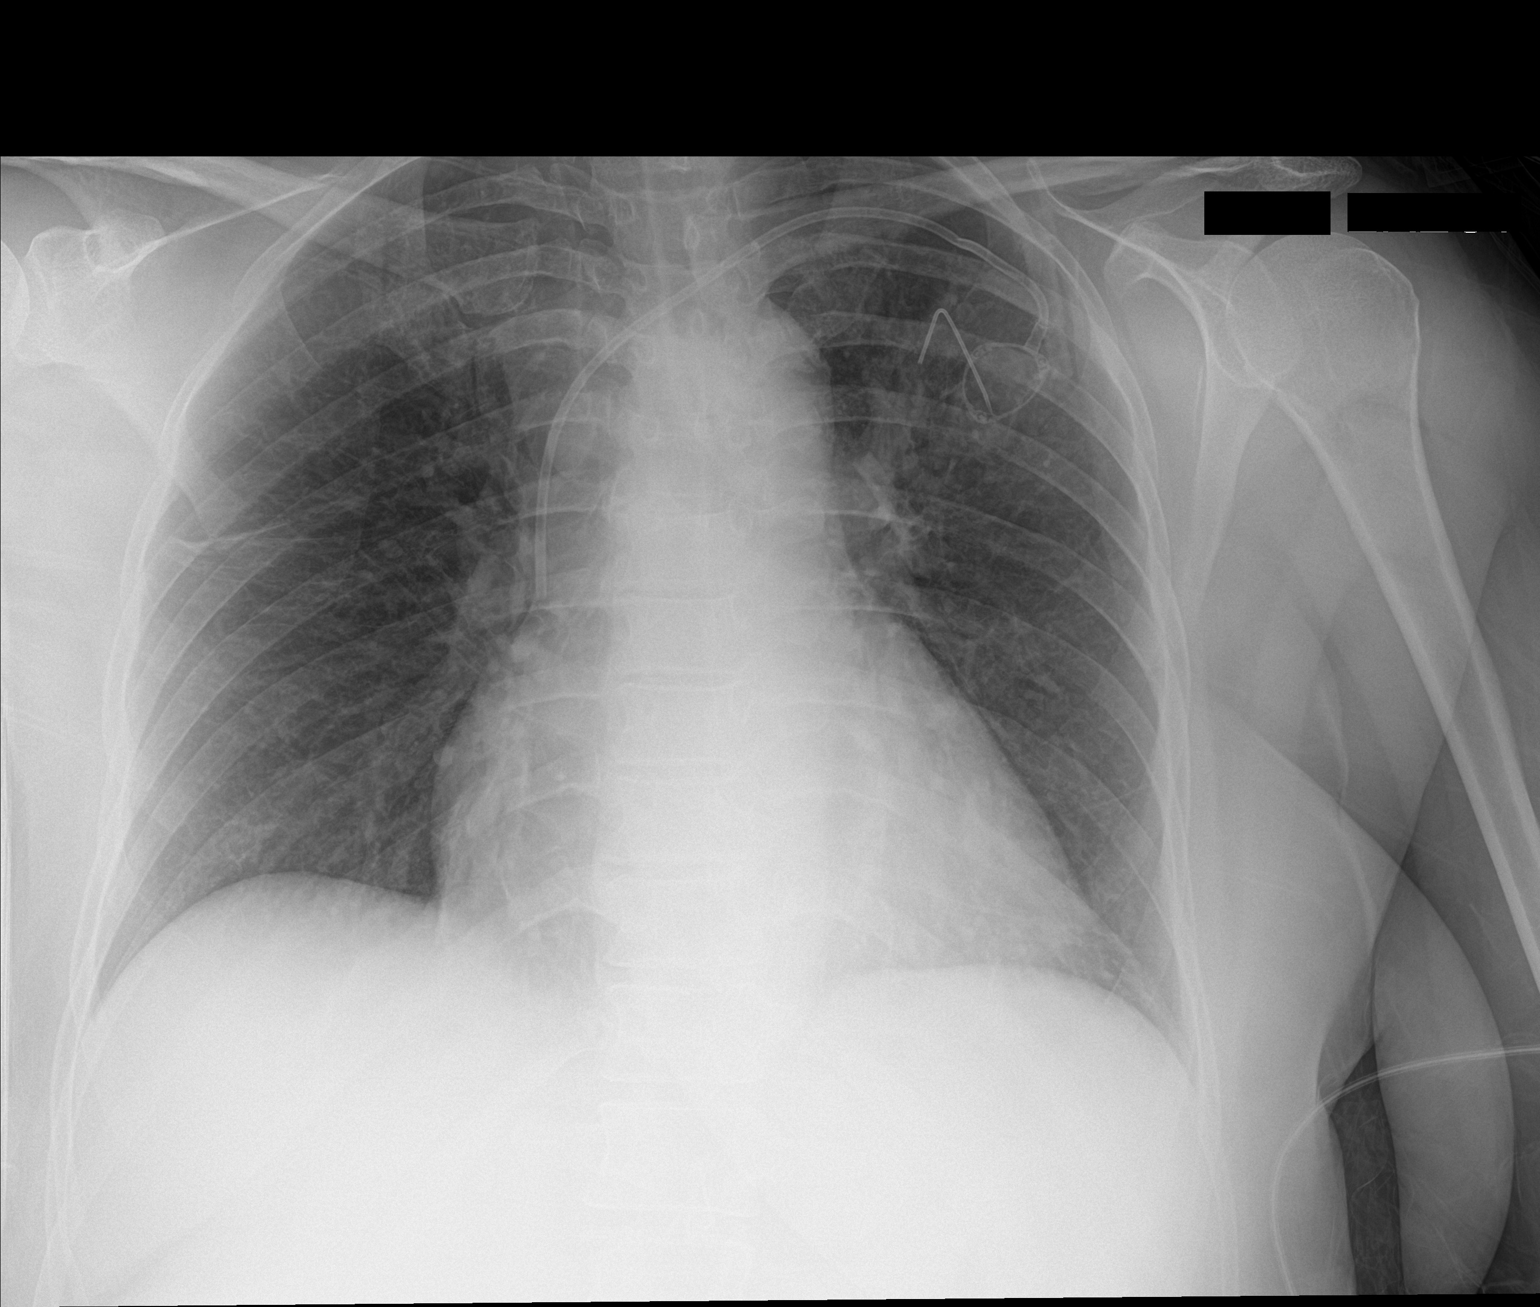

[1 of 1 positions shown; findings below may reference images not displayed]

FINDINGS: Left chest port with tip in the mid SVC. No pneumothorax. Minimal
kink of the tubing just under the clavicle. Low lung volumes. Heart
is upper normal in size likely accentuated by technique. Streaky
opacities in the right mid lung favor atelectasis or scarring. No
pleural fluid. No pulmonary edema. No acute osseous abnormalities
are seen.
IMPRESSION: 1. Tip of the left chest port in the mid SVC.  No pneumothorax.
2. Streaky opacities in the right mid lung favor atelectasis or
scarring.
3. Upper normal heart size likely accentuated by technique.

## 2020-07-23 SURGERY — INSERTION, TUNNELED CENTRAL VENOUS DEVICE, WITH PORT
Anesthesia: General | Site: Chest | Laterality: Left

## 2020-07-23 MED ORDER — ONDANSETRON HCL 4 MG/2ML IJ SOLN
INTRAMUSCULAR | Status: DC | PRN
  Administered 2020-07-23: 4 mg via INTRAVENOUS

## 2020-07-23 MED ORDER — HEPARIN SOD (PORK) LOCK FLUSH 100 UNIT/ML IV SOLN
INTRAVENOUS | Status: AC
  Filled 2020-07-23: qty 5

## 2020-07-23 MED ORDER — MIDAZOLAM HCL 5 MG/5ML IJ SOLN
INTRAMUSCULAR | Status: DC | PRN
  Administered 2020-07-23: 2 mg via INTRAVENOUS

## 2020-07-23 MED ORDER — ONDANSETRON HCL 4 MG/2ML IJ SOLN
INTRAMUSCULAR | Status: AC
  Filled 2020-07-23: qty 2

## 2020-07-23 MED ORDER — MIDAZOLAM HCL 2 MG/2ML IJ SOLN
INTRAMUSCULAR | Status: AC
  Filled 2020-07-23: qty 2

## 2020-07-23 MED ORDER — EPHEDRINE SULFATE-NACL 50-0.9 MG/10ML-% IV SOSY
PREFILLED_SYRINGE | INTRAVENOUS | Status: DC | PRN
  Administered 2020-07-23: 10 mg via INTRAVENOUS

## 2020-07-23 MED ORDER — LIDOCAINE 2% (20 MG/ML) 5 ML SYRINGE
INTRAMUSCULAR | Status: DC | PRN
  Administered 2020-07-23: 60 mg via INTRAVENOUS

## 2020-07-23 MED ORDER — CEFAZOLIN SODIUM-DEXTROSE 2-4 GM/100ML-% IV SOLN
INTRAVENOUS | Status: AC
  Filled 2020-07-23: qty 100

## 2020-07-23 MED ORDER — PROPOFOL 10 MG/ML IV BOLUS
INTRAVENOUS | Status: DC | PRN
  Administered 2020-07-23: 200 mg via INTRAVENOUS

## 2020-07-23 MED ORDER — KETOROLAC TROMETHAMINE 15 MG/ML IJ SOLN
INTRAMUSCULAR | Status: AC
  Filled 2020-07-23: qty 1

## 2020-07-23 MED ORDER — LIDOCAINE 2% (20 MG/ML) 5 ML SYRINGE
INTRAMUSCULAR | Status: AC
  Filled 2020-07-23: qty 5

## 2020-07-23 MED ORDER — HEPARIN (PORCINE) IN NACL 1000-0.9 UT/500ML-% IV SOLN
INTRAVENOUS | Status: AC
  Filled 2020-07-23: qty 500

## 2020-07-23 MED ORDER — BUPIVACAINE HCL (PF) 0.25 % IJ SOLN
INTRAMUSCULAR | Status: DC | PRN
  Administered 2020-07-23: 3 mL

## 2020-07-23 MED ORDER — ACETAMINOPHEN 500 MG PO TABS
1000.0000 mg | ORAL_TABLET | ORAL | Status: AC
  Administered 2020-07-23: 1000 mg via ORAL

## 2020-07-23 MED ORDER — DEXAMETHASONE SODIUM PHOSPHATE 10 MG/ML IJ SOLN
INTRAMUSCULAR | Status: AC
  Filled 2020-07-23: qty 1

## 2020-07-23 MED ORDER — FENTANYL CITRATE (PF) 100 MCG/2ML IJ SOLN
INTRAMUSCULAR | Status: DC | PRN
  Administered 2020-07-23 (×2): 50 ug via INTRAVENOUS

## 2020-07-23 MED ORDER — FENTANYL CITRATE (PF) 100 MCG/2ML IJ SOLN: 25.0000 ug | INTRAMUSCULAR | Status: DC | PRN

## 2020-07-23 MED ORDER — ONDANSETRON HCL 4 MG/2ML IJ SOLN: 4.0000 mg | Freq: Once | INTRAMUSCULAR | Status: DC | PRN

## 2020-07-23 MED ORDER — ACETAMINOPHEN 500 MG PO TABS
ORAL_TABLET | ORAL | Status: AC
  Filled 2020-07-23: qty 2

## 2020-07-23 MED ORDER — FENTANYL CITRATE (PF) 100 MCG/2ML IJ SOLN
INTRAMUSCULAR | Status: AC
  Filled 2020-07-23: qty 2

## 2020-07-23 MED ORDER — HEPARIN (PORCINE) IN NACL 2-0.9 UNITS/ML
INTRAMUSCULAR | Status: AC | PRN
  Administered 2020-07-23: 1

## 2020-07-23 MED ORDER — ACETAMINOPHEN 325 MG PO TABS: 325.0000 mg | ORAL_TABLET | ORAL | Status: DC | PRN

## 2020-07-23 MED ORDER — ACETAMINOPHEN 160 MG/5ML PO SOLN: 325.0000 mg | ORAL | Status: DC | PRN

## 2020-07-23 MED ORDER — EPHEDRINE 5 MG/ML INJ
INTRAVENOUS | Status: AC
  Filled 2020-07-23: qty 20

## 2020-07-23 MED ORDER — HEPARIN SOD (PORK) LOCK FLUSH 100 UNIT/ML IV SOLN
INTRAVENOUS | Status: DC | PRN
  Administered 2020-07-23: 300 [IU]

## 2020-07-23 MED ORDER — CEFAZOLIN SODIUM-DEXTROSE 2-4 GM/100ML-% IV SOLN
2.0000 g | INTRAVENOUS | Status: AC
  Administered 2020-07-23: 2 g via INTRAVENOUS

## 2020-07-23 MED ORDER — LACTATED RINGERS IV SOLN: INTRAVENOUS | Status: DC

## 2020-07-23 MED ORDER — OXYCODONE HCL 5 MG PO TABS: 5.0000 mg | ORAL_TABLET | Freq: Once | ORAL | Status: DC | PRN

## 2020-07-23 MED ORDER — MEPERIDINE HCL 25 MG/ML IJ SOLN: 6.2500 mg | INTRAMUSCULAR | Status: DC | PRN

## 2020-07-23 MED ORDER — OXYCODONE HCL 5 MG/5ML PO SOLN: 5.0000 mg | Freq: Once | ORAL | Status: DC | PRN

## 2020-07-23 MED ORDER — KETOROLAC TROMETHAMINE 15 MG/ML IJ SOLN
15.0000 mg | INTRAMUSCULAR | Status: AC
Start: 2020-07-24 — End: 2020-07-23
  Administered 2020-07-23: 15 mg via INTRAVENOUS

## 2020-07-23 MED ORDER — DEXAMETHASONE SODIUM PHOSPHATE 4 MG/ML IJ SOLN
INTRAMUSCULAR | Status: DC | PRN
  Administered 2020-07-23: 10 mg via INTRAVENOUS

## 2020-07-23 MED ORDER — TRAMADOL HCL 50 MG PO TABS: 50.0000 mg | ORAL_TABLET | Freq: Four times a day (QID) | ORAL | 0 refills | Status: DC | PRN

## 2020-07-23 SURGICAL SUPPLY — 41 items
ADH SKN CLS APL DERMABOND .7 (GAUZE/BANDAGES/DRESSINGS) ×1
APL PRP STRL LF DISP 70% ISPRP (MISCELLANEOUS) ×1
APL SKNCLS STERI-STRIP NONHPOA (GAUZE/BANDAGES/DRESSINGS) ×1
BAG DECANTER FOR FLEXI CONT (MISCELLANEOUS) ×2 IMPLANT
BENZOIN TINCTURE PRP APPL 2/3 (GAUZE/BANDAGES/DRESSINGS) ×2 IMPLANT
BLADE SURG 11 STRL SS (BLADE) ×2 IMPLANT
BLADE SURG 15 STRL LF DISP TIS (BLADE) ×1 IMPLANT
BLADE SURG 15 STRL SS (BLADE) ×2
CHLORAPREP W/TINT 26 (MISCELLANEOUS) ×2 IMPLANT
COVER BACK TABLE 60X90IN (DRAPES) ×2 IMPLANT
COVER MAYO STAND STRL (DRAPES) ×2 IMPLANT
COVER PROBE 5X48 (MISCELLANEOUS) ×2
DERMABOND ADVANCED (GAUZE/BANDAGES/DRESSINGS) ×1
DERMABOND ADVANCED .7 DNX12 (GAUZE/BANDAGES/DRESSINGS) ×1 IMPLANT
DRAPE C-ARM 42X72 X-RAY (DRAPES) ×2 IMPLANT
DRAPE LAPAROSCOPIC ABDOMINAL (DRAPES) ×2 IMPLANT
DRAPE UTILITY XL STRL (DRAPES) ×2 IMPLANT
DRSG TEGADERM 4X4.75 (GAUZE/BANDAGES/DRESSINGS) ×4 IMPLANT
ELECT COATED BLADE 2.86 ST (ELECTRODE) ×2 IMPLANT
ELECT REM PT RETURN 9FT ADLT (ELECTROSURGICAL) ×2
ELECTRODE REM PT RTRN 9FT ADLT (ELECTROSURGICAL) ×1 IMPLANT
GAUZE SPONGE 4X4 12PLY STRL LF (GAUZE/BANDAGES/DRESSINGS) ×2 IMPLANT
GLOVE SURG ENC MOIS LTX SZ6.5 (GLOVE) ×2 IMPLANT
GLOVE SURG ENC MOIS LTX SZ7 (GLOVE) ×2 IMPLANT
GLOVE SURG UNDER POLY LF SZ7.5 (GLOVE) ×2 IMPLANT
GOWN STRL REUS W/ TWL LRG LVL3 (GOWN DISPOSABLE) ×2 IMPLANT
GOWN STRL REUS W/TWL LRG LVL3 (GOWN DISPOSABLE) ×4
KIT CVR 48X5XPRB PLUP LF (MISCELLANEOUS) ×1 IMPLANT
KIT PORT POWER 8FR ISP CVUE (Port) ×2 IMPLANT
NEEDLE HYPO 25X1 1.5 SAFETY (NEEDLE) ×2 IMPLANT
PACK BASIN DAY SURGERY FS (CUSTOM PROCEDURE TRAY) ×2 IMPLANT
PENCIL SMOKE EVACUATOR (MISCELLANEOUS) ×2 IMPLANT
SLEEVE SCD COMPRESS KNEE MED (STOCKING) ×2 IMPLANT
STRIP CLOSURE SKIN 1/2X4 (GAUZE/BANDAGES/DRESSINGS) ×2 IMPLANT
SUT MNCRL AB 4-0 PS2 18 (SUTURE) ×2 IMPLANT
SUT PROLENE 2 0 SH DA (SUTURE) ×2 IMPLANT
SUT VIC AB 3-0 SH 27 (SUTURE) ×2
SUT VIC AB 3-0 SH 27X BRD (SUTURE) ×1 IMPLANT
SYR 5ML LUER SLIP (SYRINGE) ×2 IMPLANT
SYR CONTROL 10ML LL (SYRINGE) ×2 IMPLANT
TOWEL GREEN STERILE FF (TOWEL DISPOSABLE) ×2 IMPLANT

## 2020-07-23 NOTE — Anesthesia Postprocedure Evaluation (Signed)
Anesthesia Post Note  Patient: Sandy Goodwin  Procedure(s) Performed: INSERTION PORT-A-CATH (Left Chest)     Patient location during evaluation: Phase II Anesthesia Type: General Level of consciousness: awake Pain management: pain level controlled Vital Signs Assessment: post-procedure vital signs reviewed and stable Respiratory status: spontaneous breathing Cardiovascular status: stable Postop Assessment: no apparent nausea or vomiting Anesthetic complications: no   No complications documented.  Last Vitals:  Vitals:   07/23/20 1430 07/23/20 1445  BP: 118/68 119/71  Pulse: 72 70  Resp: (!) 25 12  Temp:    SpO2: 100% 100%    Last Pain:  Vitals:   07/23/20 1445  TempSrc:   PainSc: 0-No pain                 Huston Foley

## 2020-07-23 NOTE — Anesthesia Preprocedure Evaluation (Signed)
Anesthesia Evaluation  Patient identified by MRN, date of birth, ID band Patient awake    Reviewed: Allergy & Precautions, NPO status , Patient's Chart, lab work & pertinent test results  History of Anesthesia Complications (+) history of anesthetic complications  Airway Mallampati: I       Dental no notable dental hx.    Pulmonary neg pulmonary ROS,    Pulmonary exam normal        Cardiovascular Normal cardiovascular exam+ dysrhythmias Supra Ventricular Tachycardia      Neuro/Psych negative neurological ROS  negative psych ROS   GI/Hepatic negative GI ROS, Neg liver ROS,   Endo/Other  Hypothyroidism   Renal/GU negative Renal ROS  negative genitourinary   Musculoskeletal   Abdominal (+) + obese,   Peds  Hematology negative hematology ROS (+)   Anesthesia Other Findings   Reproductive/Obstetrics                             Anesthesia Physical Anesthesia Plan  ASA: II  Anesthesia Plan: General   Post-op Pain Management:    Induction: Intravenous  PONV Risk Score and Plan: 3 and Ondansetron, Dexamethasone and Midazolam  Airway Management Planned: LMA  Additional Equipment: None  Intra-op Plan:   Post-operative Plan: Extubation in OR  Informed Consent: I have reviewed the patients History and Physical, chart, labs and discussed the procedure including the risks, benefits and alternatives for the proposed anesthesia with the patient or authorized representative who has indicated his/her understanding and acceptance.     Dental advisory given  Plan Discussed with: CRNA  Anesthesia Plan Comments:         Anesthesia Quick Evaluation

## 2020-07-23 NOTE — Discharge Instructions (Signed)
PORT-A-CATH: POST OP INSTRUCTIONS  Always review your discharge instruction sheet given to you by the facility where your surgery was performed.   1. A prescription for pain medication may be given to you upon discharge. Take your pain medication as prescribed, if needed. If narcotic pain medicine is not needed, then you make take acetaminophen (Tylenol) or ibuprofen (Advil) as needed.  2. Take your usually prescribed medications unless otherwise directed. 3. If you need a refill on your pain medication, please contact our office. All narcotic pain medicine now requires a paper prescription.  Phoned in and fax refills are no longer allowed by law.  Prescriptions will not be filled after 5 pm or on weekends.  4. You should follow a light diet for the remainder of the day after your procedure. 5. Most patients will experience some mild swelling and/or bruising in the area of the incision. It may take several days to resolve. 6. It is common to experience some constipation if taking pain medication after surgery. Increasing fluid intake and taking a stool softener (such as Colace) will usually help or prevent this problem from occurring. A mild laxative (Milk of Magnesia or Miralax) should be taken according to package directions if there are no bowel movements after 48 hours.  7. Unless discharge instructions indicate otherwise, you may remove your bandages 48 hours after surgery, and you may shower at that time. You may have steri-strips (small white skin tapes) in place directly over the incision.  These strips should be left on the skin for 7-10 days.  If your surgeon used Dermabond (skin glue) on the incision, you may shower in 24 hours.  The glue will flake off over the next 2-3 weeks.  8. If your port is left accessed at the end of surgery (needle left in port), the dressing cannot get wet and should only by changed by a healthcare professional. When the port is no longer accessed (when the  needle has been removed), follow step 7.   9. ACTIVITIES:  Limit activity involving your arms for the next 72 hours. Do no strenuous exercise or activity for 1 week. You may drive when you are no longer taking prescription pain medication, you can comfortably wear a seatbelt, and you can maneuver your car. 10.You may need to see your doctor in the office for a follow-up appointment.  Please       check with your doctor.  11.When you receive a new Port-a-Cath, you will get a product guide and        ID card.  Please keep them in case you need them.  WHEN TO CALL YOUR DOCTOR [phone]): 1. Fever over 101.0 2. Chills 3. Continued bleeding from incision 4. Increased redness and tenderness at the site 5. Shortness of breath, difficulty breathing   The clinic staff is available to answer your questions during regular business hours. Please don't hesitate to call and ask to speak to one of the nurses or medical assistants for clinical concerns. If you have a medical emergency, go to the nearest emergency room or call 911.  A surgeon from [facility] is always on call at the hospital.     For further information, please visit [email or web address] May take Tylenol and Ibuprofen after 7pm, if needed.    Post Anesthesia Home Care Instructions  Activity: Get plenty of rest for the remainder of the day. A responsible individual must stay with you for 24 hours following the procedure.  For the next 24 hours, DO NOT: -Drive a car -Paediatric nurse -Drink alcoholic beverages -Take any medication unless instructed by your physician -Make any legal decisions or sign important papers.  Meals: Start with liquid foods such as gelatin or soup. Progress to regular foods as tolerated. Avoid greasy, spicy, heavy foods. If nausea and/or vomiting occur, drink only clear liquids until the nausea and/or vomiting subsides. Call your physician if vomiting continues.  Special  Instructions/Symptoms: Your throat may feel dry or sore from the anesthesia or the breathing tube placed in your throat during surgery. If this causes discomfort, gargle with warm salt water. The discomfort should disappear within 24 hours.  If you had a scopolamine patch placed behind your ear for the management of post- operative nausea and/or vomiting:  1. The medication in the patch is effective for 72 hours, after which it should be removed.  Wrap patch in a tissue and discard in the trash. Wash hands thoroughly with soap and water. 2. You may remove the patch earlier than 72 hours if you experience unpleasant side effects which may include dry mouth, dizziness or visual disturbances. 3. Avoid touching the patch. Wash your hands with soap and water after contact with the patch.

## 2020-07-23 NOTE — Transfer of Care (Signed)
Immediate Anesthesia Transfer of Care Note  Patient: Sandy Goodwin  Procedure(s) Performed: INSERTION PORT-A-CATH (Left Chest)  Patient Location: PACU  Anesthesia Type:General  Level of Consciousness: awake  Airway & Oxygen Therapy: Patient Spontanous Breathing and Patient connected to face mask oxygen  Post-op Assessment: Report given to RN and Post -op Vital signs reviewed and stable  Post vital signs: Reviewed and stable  Last Vitals:  Vitals Value Taken Time  BP 122/71 07/23/20 1426  Temp    Pulse 76 07/23/20 1428  Resp 31 07/23/20 1428  SpO2 100 % 07/23/20 1428  Vitals shown include unvalidated device data.  Last Pain:  Vitals:   07/23/20 1255  TempSrc: Oral  PainSc: 0-No pain      Patients Stated Pain Goal: 4 (83/66/29 [id number])  Complications: No complications documented.

## 2020-07-23 NOTE — Anesthesia Procedure Notes (Signed)
Procedure Name: LMA Insertion Date/Time: 07/23/2020 1:40 PM Performed by: Eulas Post, Lyana Asbill W, CRNA Pre-anesthesia Checklist: Patient identified, Emergency Drugs available, Suction available and Patient being monitored Patient Re-evaluated:Patient Re-evaluated prior to induction Oxygen Delivery Method: Circle system utilized Preoxygenation: Pre-oxygenation with 100% oxygen Induction Type: IV induction Ventilation: Mask ventilation without difficulty LMA: LMA inserted LMA Size: 4.0 Number of attempts: 1 Placement Confirmation: positive ETCO2 and breath sounds checked- equal and bilateral Tube secured with: Tape Dental Injury: Teeth and Oropharynx as per pre-operative assessment

## 2020-07-23 NOTE — H&P (Signed)
50 yof with no mass or dc underwent screening mm that showed left distortion (this went away on later imaging) and right sided calcs. she has fh of bca in her mom, maternal aunt and a ggm. she was noted to have 4.1 cm of uoq calcifications. ax u/s negative. she has had mri that shows 4.1 cm of nme and no other abnormalities. she had biopsy anterior and posterior calcs and those clips are 2.7 cm apart. biopsy of anterior is hg dcis with likely microinvasion. 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 she is here with her husband to discuss options. They both work as pa's in [place].  she previously has negative genetic testing recently and does not need it updated  Past Surgical History Conni Slipper, RN; 07/17/2020 8:04 AM) Breast Biopsy  Right. multiple Hysterectomy (not due to cancer) - Partial  Oral Surgery  Thyroid Surgery   Diagnostic Studies History Conni Slipper, RN; 07/17/2020 8:04 AM) Colonoscopy  1-5 years ago Mammogram  within last year Pap Smear  1-5 years ago  Medication History Conni Slipper, RN; 07/17/2020 8:03 AM) Medications Reconciled  Social History Conni Slipper, RN; 07/17/2020 8:04 AM) Alcohol use  Occasional alcohol use. Caffeine use  Carbonated beverages, Coffee, Tea. No drug use  Tobacco use  Never smoker.  Family History Conni Slipper, RN; 07/17/2020 8:04 AM) Bleeding disorder  Mother. Breast Cancer  Family Members In General, Mother. Cancer  Family Members In General, Mother. Cerebrovascular Accident  Family Members In General. Depression  Family Members In General, Mother. Diabetes Mellitus  Family Members In General, Mother. Heart Disease  Family Members In General. Hypertension  Brother, Family Members In General, Mother. Ovarian Cancer  Family Members In General. Respiratory Condition  Family Members In General. Thyroid problems  Family Members In General.  Pregnancy / Birth  History Conni Slipper, RN; 07/17/2020 8:04 AM) Age at menarche  3 years. Contraceptive History  Intrauterine device, Oral contraceptives. Gravida  4 Length (months) of breastfeeding  12-24 Maternal age  54-40 Para  2 Regular periods   Other Problems Conni Slipper, RN; 07/17/2020 8:04 AM) Bladder Problems  Gastroesophageal Reflux Disease  Heart murmur  Thyroid Disease    Review of Systems Conni Slipper RN; 07/17/2020 8:04 AM) General Present- Appetite Loss and Fatigue. Not Present- Chills, Fever, Night Sweats, Weight Gain and Weight Loss. Skin Present- Dryness and Rash. Not Present- Change in Wart/Mole, Hives, Jaundice, New Lesions, Non-Healing Wounds and Ulcer. HEENT Present- Seasonal Allergies. Not Present- Earache, Hearing Loss, Hoarseness, Nose Bleed, Oral Ulcers, Ringing in the Ears, Sinus Pain, Sore Throat, Visual Disturbances, Wears glasses/contact lenses and Yellow Eyes. Respiratory Not Present- Bloody sputum, Chronic Cough, Difficulty Breathing, Snoring and Wheezing. Breast Not Present- Breast Mass, Breast Pain, Nipple Discharge and Skin Changes. Cardiovascular Present- Leg Cramps. Not Present- Chest Pain, Difficulty Breathing Lying Down, Palpitations, Rapid Heart Rate, Shortness of Breath and Swelling of Extremities. Gastrointestinal Present- Nausea. Not Present- Abdominal Pain, Bloating, Bloody Stool, Change in Bowel Habits, Chronic diarrhea, Constipation, Difficulty Swallowing, Excessive gas, Gets full quickly at meals, Hemorrhoids, Indigestion, Rectal Pain and Vomiting. Female Genitourinary Not Present- Frequency, Nocturia, Painful Urination, Pelvic Pain and Urgency. Musculoskeletal Not Present- Back Pain, Joint Pain, Joint Stiffness, Muscle Pain, Muscle Weakness and Swelling of Extremities. Neurological Not Present- Decreased Memory, Fainting, Headaches, Numbness, Seizures, Tingling, Tremor, Trouble walking and Weakness. Psychiatric Not Present- Anxiety, Bipolar, Change in  Sleep Pattern, Depression,  Fearful and Frequent crying. Endocrine Not Present- Cold Intolerance, Excessive Hunger, Hair Changes, Heat Intolerance, Hot flashes and New Diabetes. Hematology Present- Easy Bruising. Not Present- Blood Thinners, Excessive bleeding, Gland problems, HIV and Persistent Infections.   Physical Exam Rolm Bookbinder MD; 07/18/2020 9:57 AM) General Mental Status-Alert. Orientation-Oriented X3. Breast Nipples-No Discharge. Breast Lump-No Palpable Breast Mass. Lymphatic Head & Neck General Head & Neck Lymphatics: Bilateral - Description - Normal. Axillary General Axillary Region: Bilateral - Description - Normal. 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 We discussed all of the different options for treatment for breast cancer including surgery, chemotherapy, radiation therapy, Herceptin, and antiestrogen therapy. I think reasonable due to her 2 pos and size of disease (although difficult to tell how much invasive disease she has- she at least has some at either extent) I favor with oncology primary systemic therapy. discussed port placement We discussed a sentinel lymph node biopsy at the time of surgery later as she does not appear to having lymph node involvement right now. We discussed the performance of that with injection of radioactive tracer. We discussed up to a 5% risk lifetime of chronic shoulder pain as well as lymphedema associated with a sentinel lymph node biopsy. We discussed the options for treatment of the breast cancer which included lumpectomy versus a mastectomy. We discussed the performance of the lumpectomy with radioactive seed placement. We discussed a 5-10% chance of a positive margin requiring reexcision in the operating  room. We also discussed that she will likely need radiation therapy if she undergoes lumpectomy. We discussed mastectomy and the postoperative care for that as well. Mastectomy can be followed by reconstruction. Most mastectomy patients will not need radiation therapy. We discussed that there is no difference in her survival whether she undergoes lumpectomy with radiation therapy or antiestrogen therapy versus a mastectomy. There is also no real difference between her recurrence in the breast. this decision would be based on a plastics appt for her as well as response.

## 2020-07-23 NOTE — Op Note (Addendum)
Preoperative diagnosis: right breast cancer Postoperative diagnosis: Same as above Procedure: Left subclavian port placement Surgeon: Dr. Serita Grammes Anesthesia: General  Estimated blood loss:minimal Specimens:none Sponge and needlecount was correct atcompletion Drains: None Disposition recovery stable condition  Indications:49 yof with no mass or dc underwent screening mm that showed left distortion (this went away on later imaging) and right sided calcs. she was noted to have 4.1 cm of uoq calcifications. ax u/s negative. she has had mri that shows 4.1 cm of nme and no other abnormalities. she had biopsy anterior and posterior calcs and those clips are 2.7 cm apart. biopsy of anterior is hg dcis with likely microinvasion. 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 We elected to proceed with primary systemic therapy and planned for port placement.   Procedure: After informed consent was obtainedshe was taken to the OR.She was given antibiotics. SCDs were placed. She was placed under general anesthesia without complication. She was prepped and draped in the standard sterile surgical fashion. A surgical timeout was then performed.  I used the ultrasound to identify theleftinternal jugular vein. Under ultrasound guidance I then accessed the vein with the needle. I started passing the wire. The wire was in the vein by ultrasound.  The wire was going to the right subclavian and then the left subclavian. I attempted multiple times to get this to pass to the cava and it would not.  I then elected to abort this attempt and to place in the left subclavian.  I accessed this on the first pass.  I placed the wire and this was in good position by fluoroscopy.  I thenmade an incisionon herleft chest.I tunneled the line between the 2 sites. I then placed the dilator over the wire. I observed this with fluoroscopy to go in  the correct position. I then removedthe wire. I then passed the line. The peel-away sheath was removed. I pulled the line back to be in the superior vena cava.The tip of the line is in the superior vena cava near the cavoatrial junction.I then attached the port. I sutured this into place with 2-0 Prolene. I then closed this with 3-0 Vicryl and 4-0 Monocryl. Glue was placed. Final fluoroscopic image showed the port to be in good position. I then accessed the port and was able to aspirate blood and packed this with heparin. I placed a dressing and left accessed to begin chemotherapy tomorrow. She tolerated well, was transferred to recovery stable.

## 2020-07-23 NOTE — Interval H&P Note (Signed)
History and Physical Interval Note:  07/23/2020 12:59 PM  Sandy Goodwin  has presented today for surgery, with the diagnosis of BREAST CANCER.  The various methods of treatment have been discussed with the patient and family. After consideration of risks, benefits and other options for treatment, the patient has consented to  Procedure(s) with comments: INSERTION PORT-A-CATH (N/A) - ROOM 8 45 MINUTES as a surgical intervention.  The patient's history has been reviewed, patient examined, no change in status, stable for surgery.  I have reviewed the patient's chart and labs.  Questions were answered to the patient's satisfaction.     Rolm Bookbinder

## 2020-07-24 ENCOUNTER — Encounter: Payer: Self-pay | Admitting: *Deleted

## 2020-07-24 ENCOUNTER — Inpatient Hospital Stay (HOSPITAL_COMMUNITY): Payer: BC Managed Care – PPO | Attending: Hematology

## 2020-07-24 ENCOUNTER — Encounter (HOSPITAL_BASED_OUTPATIENT_CLINIC_OR_DEPARTMENT_OTHER): Payer: Self-pay | Admitting: General Surgery

## 2020-07-24 ENCOUNTER — Encounter: Payer: Self-pay | Admitting: Dietician

## 2020-07-24 VITALS — BP 155/83 | HR 57 | Temp 97.1°F | Resp 16 | Wt 200.4 lb

## 2020-07-24 DIAGNOSIS — Z5112 Encounter for antineoplastic immunotherapy: Secondary | ICD-10-CM | POA: Diagnosis not present

## 2020-07-24 DIAGNOSIS — Z170 Estrogen receptor positive status [ER+]: Secondary | ICD-10-CM

## 2020-07-24 DIAGNOSIS — Z5111 Encounter for antineoplastic chemotherapy: Secondary | ICD-10-CM | POA: Diagnosis present

## 2020-07-24 DIAGNOSIS — C50411 Malignant neoplasm of upper-outer quadrant of right female breast: Secondary | ICD-10-CM | POA: Insufficient documentation

## 2020-07-24 MED ORDER — DIPHENHYDRAMINE HCL 25 MG PO CAPS
50.0000 mg | ORAL_CAPSULE | Freq: Once | ORAL | Status: AC
  Administered 2020-07-24: 50 mg via ORAL
  Filled 2020-07-24: qty 2

## 2020-07-24 MED ORDER — SODIUM CHLORIDE 0.9 % IV SOLN
840.0000 mg | Freq: Once | INTRAVENOUS | Status: AC
  Administered 2020-07-24: 840 mg via INTRAVENOUS
  Filled 2020-07-24: qty 28

## 2020-07-24 MED ORDER — SODIUM CHLORIDE 0.9 % IV SOLN
75.0000 mg/m2 | Freq: Once | INTRAVENOUS | Status: AC
  Administered 2020-07-24: 160 mg via INTRAVENOUS
  Filled 2020-07-24: qty 16

## 2020-07-24 MED ORDER — SODIUM CHLORIDE 0.9 % IV SOLN
10.0000 mg | Freq: Once | INTRAVENOUS | Status: AC
  Administered 2020-07-24: 10 mg via INTRAVENOUS
  Filled 2020-07-24: qty 10

## 2020-07-24 MED ORDER — SODIUM CHLORIDE 0.9% FLUSH
10.0000 mL | INTRAVENOUS | Status: DC | PRN
  Administered 2020-07-24: 10 mL

## 2020-07-24 MED ORDER — SODIUM CHLORIDE 0.9 % IV SOLN
150.0000 mg | Freq: Once | INTRAVENOUS | Status: AC
  Administered 2020-07-24: 150 mg via INTRAVENOUS
  Filled 2020-07-24: qty 150

## 2020-07-24 MED ORDER — ACETAMINOPHEN 325 MG PO TABS
ORAL_TABLET | ORAL | Status: AC
  Filled 2020-07-24: qty 2

## 2020-07-24 MED ORDER — SODIUM CHLORIDE 0.9 % IV SOLN: Freq: Once | INTRAVENOUS | Status: AC

## 2020-07-24 MED ORDER — PALONOSETRON HCL INJECTION 0.25 MG/5ML
0.2500 mg | Freq: Once | INTRAVENOUS | Status: AC
  Administered 2020-07-24: 0.25 mg via INTRAVENOUS
  Filled 2020-07-24: qty 5

## 2020-07-24 MED ORDER — SODIUM CHLORIDE 0.9 % IV SOLN
700.0000 mg | Freq: Once | INTRAVENOUS | Status: AC
  Administered 2020-07-24: 700 mg via INTRAVENOUS
  Filled 2020-07-24: qty 70

## 2020-07-24 MED ORDER — HEPARIN SOD (PORK) LOCK FLUSH 100 UNIT/ML IV SOLN
500.0000 [IU] | Freq: Once | INTRAVENOUS | Status: AC | PRN
  Administered 2020-07-24: 500 [IU]

## 2020-07-24 MED ORDER — ACETAMINOPHEN 325 MG PO TABS
650.0000 mg | ORAL_TABLET | Freq: Once | ORAL | Status: AC
  Administered 2020-07-24: 650 mg via ORAL

## 2020-07-24 MED ORDER — ACETAMINOPHEN 325 MG PO TABS
650.0000 mg | ORAL_TABLET | Freq: Once | ORAL | Status: AC
  Administered 2020-07-24: 650 mg via ORAL
  Filled 2020-07-24: qty 2

## 2020-07-24 MED ORDER — TRASTUZUMAB-DKST CHEMO 150 MG IV SOLR
750.0000 mg | Freq: Once | INTRAVENOUS | Status: AC
  Administered 2020-07-24: 750 mg via INTRAVENOUS
  Filled 2020-07-24: qty 35.72

## 2020-07-24 NOTE — Progress Notes (Signed)
Nutrition  Patient identified after attending Breast Clinic on 07/17/20. Patient given nutrition packet with RD contact information by nurse navigator at that time.  Chart reviewed. Patient with newly diagnosed right breast cancer. She received first cycle of neoadjuvant chemotherapy with [place] Perjeta today. Plans for 6 cycles followed by Herceptin Perjeta vs Kadcyla maintenance (based on response to chemo). Followed by breast conserving surgery, if possible with sentinel lymph node study. Followed by adjuvant radiation therapy if lumpectomy.   Ht: 5'5" Wt: 90.9 kg BMI 33.35  Patient is not currently at nutrition risk. Please consult RD if nutrition issues arise.  Lajuan Lines, [place], [facility] [phone]

## 2020-07-24 NOTE — Patient Instructions (Signed)
[facility] Discharge Instructions for Patients Receiving Chemotherapy  Today you received the following chemotherapy agents   To help prevent nausea and vomiting after your treatment, we encourage you to take your nausea medication   If you develop nausea and vomiting that is not controlled by your nausea medication, call the clinic.   BELOW ARE SYMPTOMS THAT SHOULD BE REPORTED IMMEDIATELY:  *FEVER GREATER THAN 100.5 F  *CHILLS WITH OR WITHOUT FEVER  NAUSEA AND VOMITING THAT IS NOT CONTROLLED WITH YOUR NAUSEA MEDICATION  *UNUSUAL SHORTNESS OF BREATH  *UNUSUAL BRUISING OR BLEEDING  TENDERNESS IN MOUTH AND THROAT WITH OR WITHOUT PRESENCE OF ULCERS  *URINARY PROBLEMS  *BOWEL PROBLEMS  UNUSUAL RASH Items with * indicate a potential emergency and should be followed up as soon as possible.  Feel free to call the clinic should you have any questions or concerns. The clinic phone number is [phone].  Please show the CHEMO ALERT CARD at check-in to the Emergency Department and triage nurse.   

## 2020-07-24 NOTE — Progress Notes (Signed)
Labs were done on 07-17-20, ok to proceed with day 1 per Dr. Geralyn Flash nurse and PA.   TipTip of the left chest port in the mid SVC.   2DECHO done . EF 60-65%  Treatment given per orders. Patient tolerated it well without problems. Vitals stable and discharged home from clinic ambulatory. Follow up as scheduled.

## 2020-07-25 NOTE — Progress Notes (Signed)
24 hour follow up phone call:  Patient reported that her face was flushed and red this morning when she woke up around 7 and that she had a rash around her aerola's, chest area as well, no itching. Pt reports all this has gotten better. Pt also called the navigator for Dr. Lindi Adie this morning to let her know as well.   Pt. Voiced she was a little nauseated this am but ate something and took a compazine and is better. No other complaints. Pt. States she feels pretty good today.  No other questions or concerns.

## 2020-07-26 ENCOUNTER — Encounter: Payer: Self-pay | Admitting: Hematology and Oncology

## 2020-07-26 ENCOUNTER — Other Ambulatory Visit: Payer: Self-pay

## 2020-07-26 ENCOUNTER — Inpatient Hospital Stay: Payer: BC Managed Care – PPO

## 2020-07-26 DIAGNOSIS — C50411 Malignant neoplasm of upper-outer quadrant of right female breast: Secondary | ICD-10-CM

## 2020-07-26 MED ORDER — PEGFILGRASTIM-CBQV 6 MG/0.6ML ~~LOC~~ SOSY
6.0000 mg | PREFILLED_SYRINGE | Freq: Once | SUBCUTANEOUS | Status: AC
  Administered 2020-07-26: 6 mg via SUBCUTANEOUS

## 2020-07-26 MED ORDER — PEGFILGRASTIM-JMDB 6 MG/0.6ML ~~LOC~~ SOSY
PREFILLED_SYRINGE | SUBCUTANEOUS | Status: AC
  Filled 2020-07-26: qty 0.6

## 2020-07-26 NOTE — Progress Notes (Signed)
Met with patient at registration to introduce myself as Arboriculturist and to offer available resources.   Discussed one-time $1000 Radio broadcast assistant to assist with personal expenses while going through treatment. Also, discussed available copay assistance for specific treatment drugs if she has not met her ded/OOP. She thinks she has met them. Advised if not to inform me so that we can enroll in assistance. She verbalized understanding. Gave her application for [employer] for patients whom live in [place] to receive assistance with personal expenses as well. Advised she may return application and supporting documents to me to email or she can turn it to them directly whichever is convenient.  Gave her my card if interested in applying and for any additional financial questions or concerns.

## 2020-07-26 NOTE — Patient Instructions (Signed)
Pegfilgrastim injection What is this medicine? PEGFILGRASTIM (PEG fil gra stim) is a long-acting granulocyte colony-stimulating factor that stimulates the growth of neutrophils, a type of white blood cell important in the body's fight against infection. It is used to reduce the incidence of fever and infection in patients with certain types of cancer who are receiving chemotherapy that affects the bone marrow, and to increase survival after being exposed to high doses of radiation. This medicine may be used for other purposes; ask your health care provider or pharmacist if you have questions. COMMON BRAND NAME(S): Fulphila, Neulasta, Nyvepria, UDENYCA, Ziextenzo What should I tell my health care provider before I take this medicine? They need to know if you have any of these conditions:  kidney disease  latex allergy  ongoing radiation therapy  sickle cell disease  skin reactions to acrylic adhesives (On-Body Injector only)  an unusual or allergic reaction to pegfilgrastim, filgrastim, other medicines, foods, dyes, or preservatives  pregnant or trying to get pregnant  breast-feeding How should I use this medicine? This medicine is for injection under the skin. If you get this medicine at home, you will be taught how to prepare and give the pre-filled syringe or how to use the On-body Injector. Refer to the patient Instructions for Use for detailed instructions. Use exactly as directed. Tell your healthcare provider immediately if you suspect that the On-body Injector may not have performed as intended or if you suspect the use of the On-body Injector resulted in a missed or partial dose. It is important that you put your used needles and syringes in a special sharps container. Do not put them in a trash can. If you do not have a sharps container, call your pharmacist or healthcare provider to get one. Talk to your pediatrician regarding the use of this medicine in children. While this drug  may be prescribed for selected conditions, precautions do apply. Overdosage: If you think you have taken too much of this medicine contact a poison control center or emergency room at once. NOTE: This medicine is only for you. Do not share this medicine with others. What if I miss a dose? It is important not to miss your dose. Call your doctor or health care professional if you miss your dose. If you miss a dose due to an On-body Injector failure or leakage, a new dose should be administered as soon as possible using a single prefilled syringe for manual use. What may interact with this medicine? Interactions have not been studied. This list may not describe all possible interactions. Give your health care provider a list of all the medicines, herbs, non-prescription drugs, or dietary supplements you use. Also tell them if you smoke, drink alcohol, or use illegal drugs. Some items may interact with your medicine. What should I watch for while using this medicine? Your condition will be monitored carefully while you are receiving this medicine. You may need blood work done while you are taking this medicine. Talk to your health care provider about your risk of cancer. You may be more at risk for certain types of cancer if you take this medicine. If you are going to need a MRI, CT scan, or other procedure, tell your doctor that you are using this medicine (On-Body Injector only). What side effects may I notice from receiving this medicine? Side effects that you should report to your doctor or health care professional as soon as possible:  allergic reactions (skin rash, itching or hives, swelling of   the face, lips, or tongue)  back pain  dizziness  fever  pain, redness, or irritation at site where injected  pinpoint red spots on the skin  red or dark-brown urine  shortness of breath or breathing problems  stomach or side pain, or pain at the shoulder  swelling  tiredness  trouble  passing urine or change in the amount of urine  unusual bruising or bleeding Side effects that usually do not require medical attention (report to your doctor or health care professional if they continue or are bothersome):  bone pain  muscle pain This list may not describe all possible side effects. Call your doctor for medical advice about side effects. You may report side effects to FDA at 1-800-FDA-1088. Where should I keep my medicine? Keep out of the reach of children. If you are using this medicine at home, you will be instructed on how to store it. Throw away any unused medicine after the expiration date on the label. NOTE: This sheet is a summary. It may not cover all possible information. If you have questions about this medicine, talk to your doctor, pharmacist, or health care provider.  2021 Elsevier/Gold Standard (2019-04-21 13:20:51)  

## 2020-07-29 ENCOUNTER — Other Ambulatory Visit: Payer: Self-pay

## 2020-07-29 ENCOUNTER — Telehealth: Payer: Self-pay

## 2020-07-29 ENCOUNTER — Inpatient Hospital Stay (HOSPITAL_BASED_OUTPATIENT_CLINIC_OR_DEPARTMENT_OTHER): Payer: BC Managed Care – PPO | Admitting: Medical

## 2020-07-29 ENCOUNTER — Inpatient Hospital Stay: Payer: BC Managed Care – PPO

## 2020-07-29 VITALS — BP 135/78 | HR 85 | Temp 97.8°F | Resp 18 | Ht 65.0 in | Wt 198.4 lb

## 2020-07-29 DIAGNOSIS — C50411 Malignant neoplasm of upper-outer quadrant of right female breast: Secondary | ICD-10-CM

## 2020-07-29 DIAGNOSIS — R300 Dysuria: Secondary | ICD-10-CM

## 2020-07-29 DIAGNOSIS — K1230 Oral mucositis (ulcerative), unspecified: Secondary | ICD-10-CM

## 2020-07-29 DIAGNOSIS — E860 Dehydration: Secondary | ICD-10-CM | POA: Diagnosis not present

## 2020-07-29 DIAGNOSIS — Z170 Estrogen receptor positive status [ER+]: Secondary | ICD-10-CM

## 2020-07-29 LAB — URINALYSIS, COMPLETE (UACMP) WITH MICROSCOPIC
Bacteria, UA: NONE SEEN
Bilirubin Urine: NEGATIVE
Glucose, UA: NEGATIVE mg/dL
Hgb urine dipstick: NEGATIVE
Ketones, ur: NEGATIVE mg/dL
Nitrite: NEGATIVE
Protein, ur: NEGATIVE mg/dL
Specific Gravity, Urine: 1.019 (ref 1.005–1.030)
pH: 6 (ref 5.0–8.0)

## 2020-07-29 MED ORDER — NYSTATIN 100000 UNIT/ML MT SUSP: 5.0000 mL | Freq: Four times a day (QID) | OROMUCOSAL | 3 refills | Status: DC | PRN

## 2020-07-29 MED ORDER — PHENAZOPYRIDINE HCL 200 MG PO TABS: 200.0000 mg | ORAL_TABLET | Freq: Three times a day (TID) | ORAL | 1 refills | Status: DC | PRN

## 2020-07-29 MED ORDER — VALACYCLOVIR HCL 1 G PO TABS
1000.0000 mg | ORAL_TABLET | Freq: Two times a day (BID) | ORAL | 2 refills | Status: DC
Start: 2020-07-29 — End: 2021-07-28

## 2020-07-29 MED ORDER — LIDOCAINE VISCOUS HCL 2 % MT SOLN: OROMUCOSAL | 2 refills | Status: DC

## 2020-07-29 NOTE — Telephone Encounter (Signed)
Patient called with complaints of UTI symptoms (burning/pain with urination). Had negative urine culture on Saturday 4/16, but symptoms have worsened since. Scheduled patient in [facility] this afternoon. Orders placed for urinalysis and urine culture. Patient verbalized understanding.

## 2020-07-30 ENCOUNTER — Telehealth: Payer: Self-pay

## 2020-07-30 ENCOUNTER — Inpatient Hospital Stay: Payer: BC Managed Care – PPO

## 2020-07-30 VITALS — BP 138/88 | HR 118 | Temp 99.1°F | Resp 16 | Ht 65.0 in | Wt 193.5 lb

## 2020-07-30 DIAGNOSIS — C50411 Malignant neoplasm of upper-outer quadrant of right female breast: Secondary | ICD-10-CM | POA: Diagnosis not present

## 2020-07-30 DIAGNOSIS — Z95828 Presence of other vascular implants and grafts: Secondary | ICD-10-CM

## 2020-07-30 DIAGNOSIS — E860 Dehydration: Secondary | ICD-10-CM

## 2020-07-30 MED ORDER — HEPARIN SOD (PORK) LOCK FLUSH 100 UNIT/ML IV SOLN
500.0000 [IU] | Freq: Once | INTRAVENOUS | Status: AC
  Administered 2020-07-30: 500 [IU] via INTRAVENOUS
  Filled 2020-07-30: qty 5

## 2020-07-30 MED ORDER — SODIUM CHLORIDE 0.9% FLUSH
10.0000 mL | Freq: Once | INTRAVENOUS | Status: AC
Start: 2020-07-30 — End: 2020-07-30
  Administered 2020-07-30: 10 mL via INTRAVENOUS
  Filled 2020-07-30: qty 10

## 2020-07-30 MED ORDER — SODIUM CHLORIDE 0.9 % IV SOLN
Freq: Once | INTRAVENOUS | Status: AC
  Filled 2020-07-30: qty 250

## 2020-07-30 NOTE — Patient Instructions (Signed)
Dehydration, Adult Dehydration is condition in which there is not enough water or other fluids in the body. This happens when a person loses more fluids than he or she takes in. Important body parts cannot work right without the right amount of fluids. Any loss of fluids from the body can cause dehydration. Dehydration can be mild, worse, or very bad. It should be treated right away to keep it from getting very bad. What are the causes? This condition may be caused by:  Conditions that cause loss of water or other fluids, such as: ? Watery poop (diarrhea). ? Vomiting. ? Sweating a lot. ? Peeing (urinating) a lot.  Not drinking enough fluids, especially when you: ? Are ill. ? Are doing things that take a lot of energy to do.  Other illnesses and conditions, such as fever or infection.  Certain medicines, such as medicines that take extra fluid out of the body (diuretics).  Lack of safe drinking water.  Not being able to get enough water and food. What increases the risk? The following factors may make you more likely to develop this condition:  Having a long-term (chronic) illness that has not been treated the right way, such as: ? Diabetes. ? Heart disease. ? Kidney disease.  Being 65 years of age or older.  Having a disability.  Living in a place that is high above the ground or sea (high in altitude). The thinner, dried air causes more fluid loss.  Doing exercises that put stress on your body for a long time. What are the signs or symptoms? Symptoms of dehydration depend on how bad it is. Mild or worse dehydration  Thirst.  Dry lips or dry mouth.  Feeling dizzy or light-headed, especially when you stand up from sitting.  Muscle cramps.  Your body making: ? Dark pee (urine). Pee may be the color of tea. ? Less pee than normal. ? Less tears than normal.  Headache. Very bad dehydration  Changes in skin. Skin may: ? Be cold to the touch (clammy). ? Be blotchy  or pale. ? Not go back to normal right after you lightly pinch it and let it go.  Little or no tears, pee, or sweat.  Changes in vital signs, such as: ? Fast breathing. ? Low blood pressure. ? Weak pulse. ? Pulse that is more than 100 beats a minute when you are sitting still.  Other changes, such as: ? Feeling very thirsty. ? Eyes that look hollow (sunken). ? Cold hands and feet. ? Being mixed up (confused). ? Being very tired (lethargic) or having trouble waking from sleep. ? Short-term weight loss. ? Loss of consciousness. How is this treated? Treatment for this condition depends on how bad it is. Treatment should start right away. Do not wait until your condition gets very bad. Very bad dehydration is an emergency. You will need to go to a hospital.  Mild or worse dehydration can be treated at home. You may be asked to: ? Drink more fluids. ? Drink an oral rehydration solution (ORS). This drink helps get the right amounts of fluids and salts and minerals in the blood (electrolytes).  Very bad dehydration can be treated: ? With fluids through an IV tube. ? By getting normal levels of salts and minerals in your blood. This is often done by giving salts and minerals through a tube. The tube is passed through your nose and into your stomach. ? By treating the root cause. Follow these instructions at   home: Oral rehydration solution If told by your doctor, drink an ORS:  Make an ORS. Use instructions on the package.  Start by drinking small amounts, about  cup (120 mL) every 5-10 minutes.  Slowly drink more until you have had the amount that your doctor said to have. Eating and drinking  Drink enough clear fluid to keep your pee pale yellow. If you were told to drink an ORS, finish the ORS first. Then, start slowly drinking other clear fluids. Drink fluids such as: ? Water. Do not drink only water. Doing that can make the salt (sodium) level in your body get too low. ? Water  from ice chips you suck on. ? Fruit juice that you have added water to (diluted). ? Low-calorie sports drinks.  Eat foods that have the right amounts of salts and minerals, such as: ? Bananas. ? Oranges. ? Potatoes. ? Tomatoes. ? Spinach.  Do not drink alcohol.  Avoid: ? Drinks that have a lot of sugar. These include:  High-calorie sports drinks.  Fruit juice that you did not add water to.  Soda.  Caffeine. ? Foods that are greasy or have a lot of fat or sugar.         General instructions  Take over-the-counter and prescription medicines only as told by your doctor.  Do not take salt tablets. Doing that can make the salt level in your body get too high.  Return to your normal activities as told by your doctor. Ask your doctor what activities are safe for you.  Keep all follow-up visits as told by your doctor. This is important. Contact a doctor if:  You have pain in your belly (abdomen) and the pain: ? Gets worse. ? Stays in one place.  You have a rash.  You have a stiff neck.  You get angry or annoyed (irritable) more easily than normal.  You are more tired or have a harder time waking than normal.  You feel: ? Weak or dizzy. ? Very thirsty. Get help right away if you have:  Any symptoms of very bad dehydration.  Symptoms of vomiting, such as: ? You cannot eat or drink without vomiting. ? Your vomiting gets worse or does not go away. ? Your vomit has blood or green stuff in it.  Symptoms that get worse with treatment.  A fever.  A very bad headache.  Problems with peeing or pooping (having a bowel movement), such as: ? Watery poop that gets worse or does not go away. ? Blood in your poop (stool). This may cause poop to look black and tarry. ? Not peeing in 6-8 hours. ? Peeing only a small amount of very dark pee in 6-8 hours.  Trouble breathing. These symptoms may be an emergency. Do not wait to see if the symptoms will go away. Get  medical help right away. Call your local emergency services (911 in the [place] Do not drive yourself to the hospital. Summary  Dehydration is a condition in which there is not enough water or other fluids in the body. This happens when a person loses more fluids than he or she takes in.  Treatment for this condition depends on how bad it is. Treatment should be started right away. Do not wait until your condition gets very bad.  Drink enough clear fluid to keep your pee pale yellow. If you were told to drink an oral rehydration solution (ORS), finish the ORS first. Then, start slowly drinking other clear fluids.    Take over-the-counter and prescription medicines only as told by your doctor.  Get help right away if you have any symptoms of very bad dehydration. This information is not intended to replace advice given to you by your health care provider. Make sure you discuss any questions you have with your health care provider. Document Revised: 11/10/2018 Document Reviewed: 11/10/2018 Elsevier Patient Education  2021 [employer]vier [employer].  

## 2020-07-30 NOTE — Telephone Encounter (Signed)
Returned VM from patient's husband requesting IVF per visit with Sandi Mealy, PA, on 4/18. Informed patient she could come in at 2pm for IVF today. Patient verbalized understanding.

## 2020-07-31 ENCOUNTER — Other Ambulatory Visit: Payer: Self-pay

## 2020-07-31 ENCOUNTER — Emergency Department (HOSPITAL_COMMUNITY)
Admission: EM | Admit: 2020-07-31 | Discharge: 2020-07-31 | Disposition: A | Discharge status: Home / Self Care | Payer: BC Managed Care – PPO | Attending: Emergency Medicine | Admitting: Emergency Medicine

## 2020-07-31 ENCOUNTER — Telehealth: Payer: Self-pay

## 2020-07-31 ENCOUNTER — Emergency Department (HOSPITAL_COMMUNITY): Payer: BC Managed Care – PPO

## 2020-07-31 DIAGNOSIS — Z79899 Other long term (current) drug therapy: Secondary | ICD-10-CM | POA: Insufficient documentation

## 2020-07-31 DIAGNOSIS — R300 Dysuria: Secondary | ICD-10-CM | POA: Insufficient documentation

## 2020-07-31 DIAGNOSIS — Z20822 Contact with and (suspected) exposure to covid-19: Secondary | ICD-10-CM | POA: Diagnosis not present

## 2020-07-31 DIAGNOSIS — E039 Hypothyroidism, unspecified: Secondary | ICD-10-CM | POA: Insufficient documentation

## 2020-07-31 DIAGNOSIS — R070 Pain in throat: Secondary | ICD-10-CM | POA: Diagnosis not present

## 2020-07-31 DIAGNOSIS — R509 Fever, unspecified: Secondary | ICD-10-CM | POA: Diagnosis present

## 2020-07-31 DIAGNOSIS — Z853 Personal history of malignant neoplasm of breast: Secondary | ICD-10-CM | POA: Diagnosis not present

## 2020-07-31 LAB — CBC WITH DIFFERENTIAL/PLATELET
Abs Immature Granulocytes: 1.44 10*3/uL — ABNORMAL HIGH (ref 0.00–0.07)
Basophils Absolute: 0 10*3/uL (ref 0.0–0.1)
Basophils Relative: 0 %
Eosinophils Absolute: 0.1 10*3/uL (ref 0.0–0.5)
Eosinophils Relative: 1 %
HCT: 40.6 % (ref 36.0–46.0)
Hemoglobin: 13.1 g/dL (ref 12.0–15.0)
Immature Granulocytes: 18 %
Lymphocytes Relative: 30 %
Lymphs Abs: 2.4 10*3/uL (ref 0.7–4.0)
MCH: 30 pg (ref 26.0–34.0)
MCHC: 32.3 g/dL (ref 30.0–36.0)
MCV: 92.9 fL (ref 80.0–100.0)
Monocytes Absolute: 1.6 10*3/uL — ABNORMAL HIGH (ref 0.1–1.0)
Monocytes Relative: 20 %
Neutro Abs: 2.5 10*3/uL (ref 1.7–7.7)
Neutrophils Relative %: 31 %
Platelets: 211 10*3/uL (ref 150–400)
RBC: 4.37 MIL/uL (ref 3.87–5.11)
RDW: 13.6 % (ref 11.5–15.5)
WBC: 7.9 10*3/uL (ref 4.0–10.5)
nRBC: 0.6 % — ABNORMAL HIGH (ref 0.0–0.2)

## 2020-07-31 LAB — URINE CULTURE: Culture: NO GROWTH

## 2020-07-31 LAB — URINALYSIS, ROUTINE W REFLEX MICROSCOPIC
Bacteria, UA: NONE SEEN
Bilirubin Urine: NEGATIVE
Glucose, UA: NEGATIVE mg/dL
Hgb urine dipstick: NEGATIVE
Ketones, ur: NEGATIVE mg/dL
Leukocytes,Ua: NEGATIVE
Nitrite: POSITIVE — AB
Protein, ur: NEGATIVE mg/dL
Specific Gravity, Urine: 1.01 (ref 1.005–1.030)
pH: 6 (ref 5.0–8.0)

## 2020-07-31 LAB — COMPREHENSIVE METABOLIC PANEL
ALT: 68 U/L — ABNORMAL HIGH (ref 0–44)
AST: 41 U/L (ref 15–41)
Albumin: 4.2 g/dL (ref 3.5–5.0)
Alkaline Phosphatase: 134 U/L — ABNORMAL HIGH (ref 38–126)
Anion gap: 10 (ref 5–15)
BUN: 13 mg/dL (ref 6–20)
CO2: 24 mmol/L (ref 22–32)
Calcium: 9.3 mg/dL (ref 8.9–10.3)
Chloride: 106 mmol/L (ref 98–111)
Creatinine, Ser: 0.71 mg/dL (ref 0.44–1.00)
GFR, Estimated: >60 mL/min (ref >60)
Glucose, Bld: 89 mg/dL (ref 70–99)
Potassium: 4 mmol/L (ref 3.5–5.1)
Sodium: 140 mmol/L (ref 135–145)
Total Bilirubin: 0.6 mg/dL (ref 0.3–1.2)
Total Protein: 7.5 g/dL (ref 6.5–8.1)

## 2020-07-31 LAB — C DIFFICILE QUICK SCREEN W PCR REFLEX
C Diff antigen: NEGATIVE
C Diff interpretation: NOT DETECTED
C Diff toxin: NEGATIVE

## 2020-07-31 LAB — RESP PANEL BY RT-PCR (FLU A&B, COVID) ARPGX2
Influenza A by PCR: NEGATIVE
Influenza B by PCR: NEGATIVE
SARS Coronavirus 2 by RT PCR: NEGATIVE

## 2020-07-31 LAB — LACTIC ACID, PLASMA: Lactic Acid, Venous: 1.1 mmol/L (ref 0.5–1.9)

## 2020-07-31 LAB — I-STAT BETA HCG BLOOD, ED (MC, WL, AP ONLY): I-stat hCG, quantitative: <5 m[IU]/mL (ref <5)

## 2020-07-31 IMAGING — CR DG CHEST 2V
2 series · 2 of 2 positions shown · non-contrast
Comparison: One-view chest x-ray [DATE]

CLINICAL DATA: Fever last night. First round of chemotherapy 1 week
ago.

EXAM:
CHEST - 2 VIEW

[w chest pa]
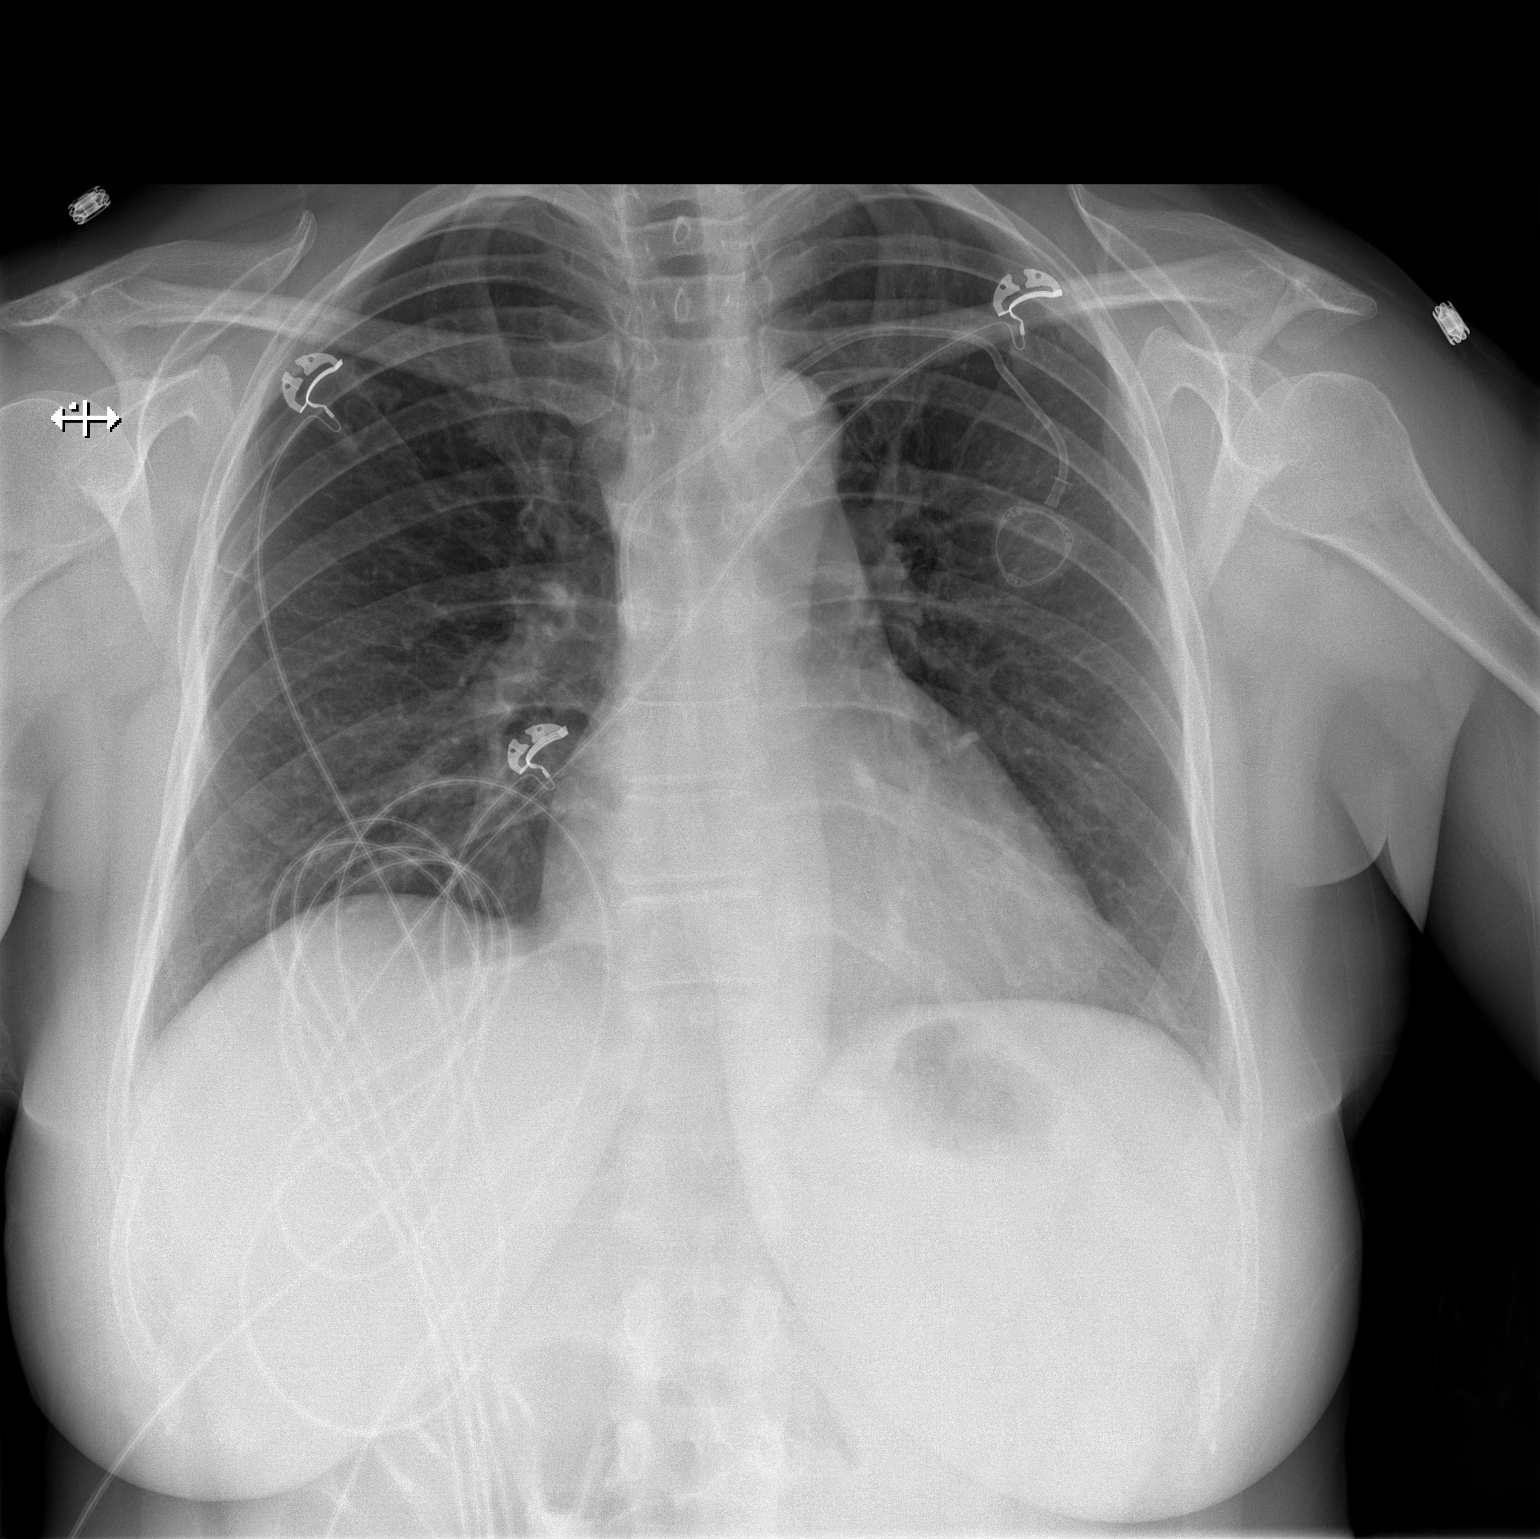

[w chest lat]
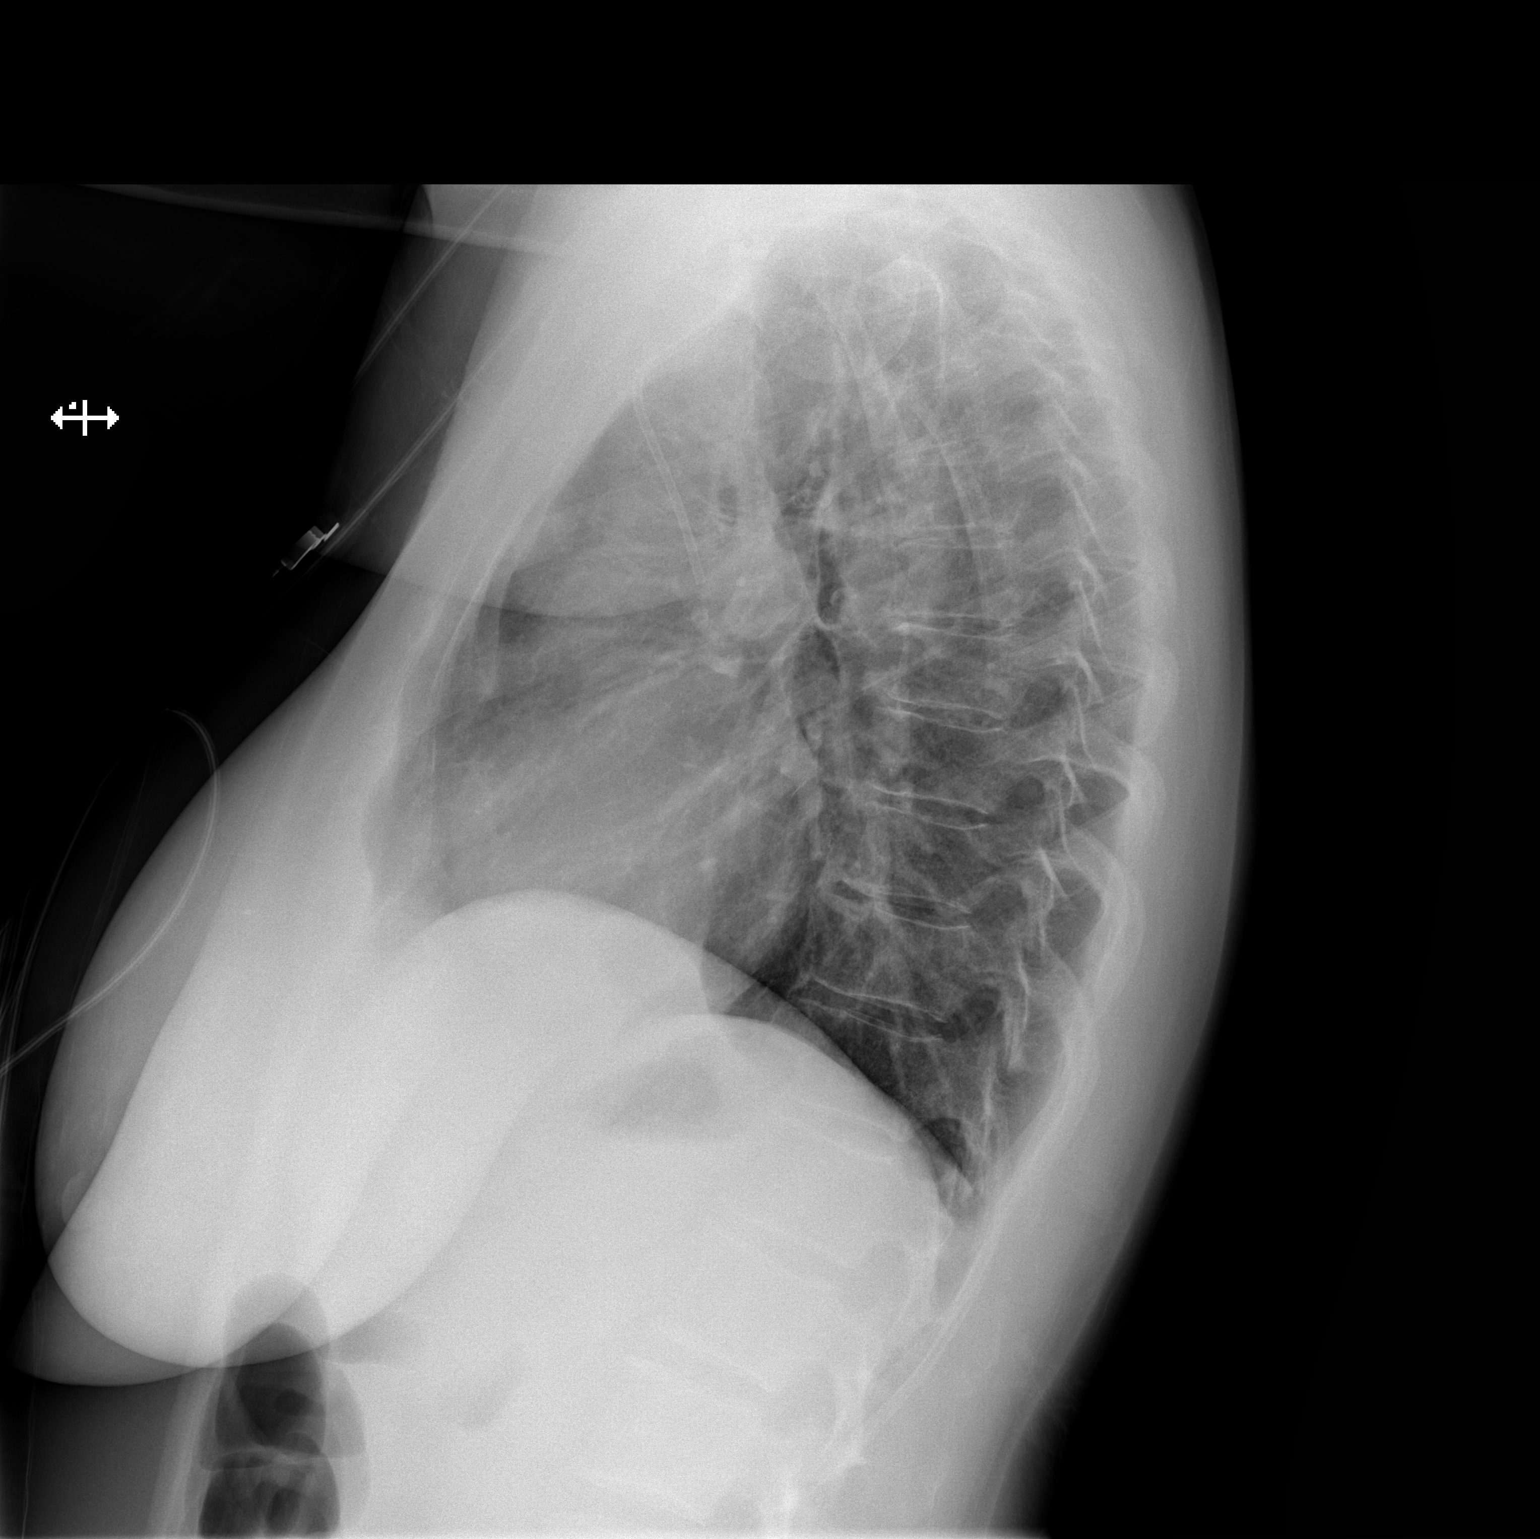

[2 of 2 positions shown; findings below may reference images not displayed]

FINDINGS: Left subclavian Port-A-Cath is stable in position. Heart size is.
Lung volumes are low. No edema or effusion is present. No focal
airspace disease is present. Visualized soft tissues and bony thorax
are unremarkable.
IMPRESSION: 1. Low lung volumes.
2. No acute cardiopulmonary disease.

## 2020-07-31 MED ORDER — HEPARIN SOD (PORK) LOCK FLUSH 100 UNIT/ML IV SOLN
500.0000 [IU] | Freq: Once | INTRAVENOUS | Status: AC
  Administered 2020-07-31: 500 [IU]
  Filled 2020-07-31: qty 5

## 2020-07-31 MED ORDER — SODIUM CHLORIDE 0.9 % IV BOLUS
1000.0000 mL | Freq: Once | INTRAVENOUS | Status: AC
  Administered 2020-07-31: 1000 mL via INTRAVENOUS

## 2020-07-31 NOTE — Telephone Encounter (Signed)
Received VM from patient c/o fevers to 102 degrees, malaise, and generally feeling unwell. Spoke with Sandi Mealy, PA, who had seen patient on 4/18. Advised that patient should present to ED for work-up. Returned call and instructed patient to go to ED. Patient verbalized understanding. Dr Lindi Adie and his nurse notified.

## 2020-07-31 NOTE — ED Notes (Signed)
Patient back from x-ray 

## 2020-07-31 NOTE — Discharge Instructions (Signed)
Please follow-up with Dr. Alen Blew within the next 3 to 5 days for reassessment and return to the emergency department for new or worsening symptoms.

## 2020-07-31 NOTE — ED Triage Notes (Signed)
Patient had first round of chemo last Wednesday, reports a fever of 102 last night, took tylenol, endorses chills and sweats. Denies emesis, endorses diarrhea. Burning w/ urination, negative Urine and urine culture resulted this AM.

## 2020-07-31 NOTE — Progress Notes (Signed)
[facility] Progress Note   BETHENY SUCHECKI [id number] October 30, 1970 50 y.o.  [name] is managed by Dr. Nicholas Lose  Actively treated with chemotherapy/immunotherapy/hormonal therapy: Yes  Current therapy: Carboplatin, Taxotere, Perjeta and Ogivri with Udenyca support  Last treated: 07/24/2020 (cycle 1)  Next scheduled appointment with provider: 08/05/2020  Assessment: Plan:    Dehydration  Dysuria - Plan: phenazopyridine (PYRIDIUM) 200 MG tablet  Mucositis - Plan: lidocaine (XYLOCAINE) 2 % solution, valACYclovir (VALTREX) 1000 MG tablet, magic mouthwash (nystatin, diphenhydrAMINE, alum & mag hydroxide) suspension mixture  Malignant neoplasm of upper-outer quadrant of right breast in female, estrogen receptor positive ([place])   Suspected dehydration: The patient was told to push fluids and was told to return as needed if she required IV fluids.  Dysuria suspected secondary to mucositis: Patient was given a prescription for Pyridium.  Mucositis: Patient was given a prescription for viscous lidocaine, Valtrex, and Magic mouthwash.  ER positive malignant neoplasm of the right breast: The patient is status post cycle 1 of carboplatin, Taxotere, Perjeta and Ogivri with Udenyca support which was dosed on 07/24/2020.  She is scheduled to follow-up with Dr. Lindi Adie on 08/05/2020.  Please see After Visit Summary for patient specific instructions.  Future Appointments  Date Time Provider [place]  08/05/2020 12:15 PM [facility] [place] None  08/05/2020  1:00 PM Nicholas Lose, MD [facility] None  08/14/2020  7:45 AM [facility] [place] FLUSH [facility] None  08/14/2020  8:15 AM Nicholas Lose, MD [facility] None  08/14/2020  9:00 AM [facility] INFUSION [facility] None  08/16/2020 11:00 AM [facility] [place] FLUSH [facility] None  09/11/2020  8:45 AM [facility] [place] FLUSH [facility] None  09/11/2020  9:15 AM Nicholas Lose, MD [facility] None  09/11/2020 10:15 AM [facility]  INFUSION [facility] None  09/13/2020 12:00 PM [facility] [place] FLUSH [facility] None  09/25/2020  8:45 AM [facility] [place] FLUSH [facility] None  09/25/2020  9:15 AM Nicholas Lose, MD [facility] None  09/25/2020 10:15 AM [facility] INFUSION [facility] None  09/27/2020 12:00 PM [facility] [place] FLUSH [facility] None  10/16/2020  8:45 AM [facility] [place] FLUSH [facility] None  10/16/2020  9:15 AM Nicholas Lose, MD [facility] None  10/16/2020 10:15 AM [facility] INFUSION [facility] None  10/18/2020 12:00 PM [facility] [place] FLUSH [facility] None    No orders of the defined types were placed in this encounter.      Subjective:   Patient ID:  Sandy Goodwin is a 50 y.o. (DOB [birth year]) female.  Chief Complaint: No chief complaint on file.   HPI Sandy Goodwin  is a 50 y.o. female with a diagnosis of an ER positive malignant neoplasm of the right breast.  She is followed by Dr. Nicholas Lose and is status post cycle 1 of carboplatin, Taxotere, Perjeta and Ogivri with Udenyca support which was dosed on 07/24/2020.  She presents to clinic today with a report of a sore mouth and throat.  She also reports burning with urination.  She had a urine culture completed on Saturday which by her report returned negative.  She was given Bactrim.  She reports some chest heaviness.  Her symptoms started the first week after her first cycle of chemotherapy.  She reports that she is drinking and pushing fluids as much as possible.  She denies fevers, chills, sweats, nausea, vomiting, constipation, or diarrhea.  Medications: I have reviewed the patient's current medications.  Allergies:  Allergies  Allergen Reactions  . Codeine Other (See Comments)    Makes pt agittated  and stay awake   . Hydrocodone Other (See Comments)    Makes pt agittated and stay awake     Past Medical History:  Diagnosis Date  . Arthritis   . Breast cancer ([place])   . Complication of anesthesia    Itching  . Dysrhythmia    Tachycardia  . Hypothyroidism      Past Surgical History:  Procedure Laterality Date  . ABDOMINAL HYSTERECTOMY    . APPENDECTOMY  2008  . Bowel Rupture Repair  2008   Ruptured during appendectomy  . Breast Biopsy Right 1999  . COLONOSCOPY N/A 06/15/2016   Procedure: COLONOSCOPY;  Surgeon: Rogene Houston, MD;  [facility];  Service: Endoscopy;  Laterality: N/A;  845  . IBF  [id number]  . PORTACATH PLACEMENT Left 07/23/2020   Procedure: INSERTION PORT-A-CATH;  Surgeon: Rolm Bookbinder, MD;  Location: [facility];  Service: General;  Laterality: Left;  . THYROIDECTOMY, PARTIAL Bilateral 2012    Family History  Problem Relation Age of Onset  . Hypertension Mother   . Breast cancer Mother   . Lymphoma Mother   . Hypertension Brother   . Breast cancer Maternal Aunt   . Lymphoma Maternal Grandfather   . Breast cancer Maternal Aunt     Social History   Socioeconomic History  . Marital status: Married    Spouse name: Not on file  . Number of children: Not on file  . Years of education: Not on file  . Highest education level: Not on file  Occupational History  . Not on file  Tobacco Use  . Smoking status: Never Smoker  . Smokeless tobacco: Never Used  Vaping Use  . Vaping Use: Never used  Substance and Sexual Activity  . Alcohol use: No  . Drug use: No  . Sexual activity: Yes  Other Topics Concern  . Not on file  Social History Narrative  . Not on file   Social Determinants of Health   Financial Resource Strain: Not on file  Food Insecurity: Not on file  Transportation Needs: Not on file  Physical Activity: Not on file  Stress: Not on file  Social Connections: Not on file  Intimate Partner Violence: Not on file    Past Medical History, Surgical history, Social history, and Family history were reviewed and updated as appropriate.   Please see review of systems for further details on the patient's review from today.   Review of Systems:  Review of Systems   Constitutional: Positive for appetite change. Negative for chills, diaphoresis and fever.  HENT: Positive for mouth sores and trouble swallowing. Negative for sore throat and voice change.   Respiratory: Negative for cough, chest tightness, shortness of breath and wheezing.        Chest heaviness.  Cardiovascular: Negative for chest pain and palpitations.  Gastrointestinal: Negative for abdominal pain, constipation, diarrhea, nausea and vomiting.  Genitourinary: Positive for dysuria.  Musculoskeletal: Negative for back pain and myalgias.  Neurological: Negative for dizziness, light-headedness and headaches.    Objective:   Physical Exam:  BP 135/78 (BP Location: Right Arm, Patient Position: Sitting)   Pulse 85   Temp 97.8 F (36.6 C) (Tympanic)   Resp 18   Ht 5\' 5"  (1.651 m)   Wt 198 lb 6.4 oz (90 kg)   SpO2 98%   BMI 33.02 kg/m  ECOG: 0  Physical Exam Constitutional:      General: She is not in acute distress.  Appearance: She is not toxic-appearing or diaphoretic.  HENT:     Head: Normocephalic and atraumatic.     Mouth/Throat:     Mouth: Mucous membranes are moist.     Pharynx: No oropharyngeal exudate or posterior oropharyngeal erythema.     Comments: The patient was noted to have multiple areas of ulceration on her lateral tongue and bilateral buccal membrane. Eyes:     General: No scleral icterus.       Right eye: No discharge.        Left eye: No discharge.  Cardiovascular:     Rate and Rhythm: Normal rate and regular rhythm.     Heart sounds: Normal heart sounds. No murmur heard. No friction rub. No gallop.   Pulmonary:     Effort: Pulmonary effort is normal. No respiratory distress.     Breath sounds: Normal breath sounds. No wheezing or rales.  Skin:    General: Skin is warm and dry.     Findings: No erythema or rash.  Neurological:     Mental Status: She is alert.     Gait: Gait normal.  Psychiatric:        Mood and Affect: Mood normal.         Behavior: Behavior normal.        Thought Content: Thought content normal.        Judgment: Judgment normal.     Lab Review:     Component Value Date/Time   NA 140 07/31/2020 1315   K 4.0 07/31/2020 1315   CL 106 07/31/2020 1315   CO2 24 07/31/2020 1315   GLUCOSE 89 07/31/2020 1315   BUN 13 07/31/2020 1315   CREATININE 0.71 07/31/2020 1315   CREATININE 0.78 07/17/2020 1230   CALCIUM 9.3 07/31/2020 1315   PROT 7.5 07/31/2020 1315   ALBUMIN 4.2 07/31/2020 1315   AST 41 07/31/2020 1315   AST 21 07/17/2020 1230   ALT 68 (H) 07/31/2020 1315   ALT 37 07/17/2020 1230   ALKPHOS 134 (H) 07/31/2020 1315   BILITOT 0.6 07/31/2020 1315   BILITOT 0.4 07/17/2020 1230   GFRNONAA >60 07/31/2020 1315   GFRNONAA >60 07/17/2020 1230       Component Value Date/Time   WBC 7.9 07/31/2020 1315   RBC 4.37 07/31/2020 1315   HGB 13.1 07/31/2020 1315   HGB 13.4 07/17/2020 1230   HCT 40.6 07/31/2020 1315   PLT 211 07/31/2020 1315   PLT 231 07/17/2020 1230   MCV 92.9 07/31/2020 1315   MCH 30.0 07/31/2020 1315   MCHC 32.3 07/31/2020 1315   RDW 13.6 07/31/2020 1315   LYMPHSABS 2.4 07/31/2020 1315   MONOABS 1.6 (H) 07/31/2020 1315   EOSABS 0.1 07/31/2020 1315   BASOSABS 0.0 07/31/2020 1315   -------------------------------  Imaging from last 24 hours (if applicable):  Radiology interpretation: DG Chest 2 View  Result Date: 07/31/2020 CLINICAL DATA:  Fever last night. First round of chemotherapy 1 week ago. EXAM: CHEST - 2 VIEW COMPARISON:  One-view chest x-ray 07/23/2020 FINDINGS: Left subclavian Port-A-Cath is stable in position. Heart size is. Lung volumes are low. No edema or effusion is present. No focal airspace disease is present. Visualized soft tissues and bony thorax are unremarkable. IMPRESSION: 1. Low lung volumes. 2. No acute cardiopulmonary disease. Electronically Signed   By: San Morelle M.D.   On: 07/31/2020 13:48   MR BREAST BILATERAL W WO CONTRAST INC CAD  Result  Date: 07/12/2020 CLINICAL DATA:  Family history  of breast cancer diagnosed in the patient's mother, maternal aunt, and paternal grandmother. Patient had recent stereotactic guided core biopsy of anterior and posterior extent of suspicious RIGHT breast calcifications in the UPPER-OUTER QUADRANT. 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 LABS:  None obtained at the time of imaging. EXAM: BILATERAL BREAST MRI WITH AND WITHOUT CONTRAST TECHNIQUE: Multiplanar, multisequence MR images of both breasts were obtained prior to and following the intravenous administration of 9 ml of Gadavist Three-dimensional MR images were rendered by post-processing of the original MR data on an independent workstation. The three-dimensional MR images were interpreted, and findings are reported in the following complete MRI report for this study. Three dimensional images were evaluated at the independent interpreting workstation using the DynaCAD thin client. COMPARISON:  07/10/2020 and MRI on 05/29/2019 FINDINGS: Breast composition: c. Heterogeneous fibroglandular tissue. Background parenchymal enhancement: Moderate. Right breast: Within the UPPER central portion of the RIGHT breast, there is clumped non mass enhancement, slightly greater than background. This area measures 4.1 x 3.6 x 1.4 centimeters. Large marker clip artifact marks the posterior aspect of the area of enhancement, consistent with a coil clip. A small artifact marks the anterior aspect of the enhancement, consistent with an X shaped clip. The area of enhancement correlates well with the distribution of calcifications seen mammographically. No additional areas of concern in the RIGHT breast. Left breast: No mass or abnormal enhancement. Lymph nodes: No abnormal appearing lymph nodes. Ancillary findings:  None IMPRESSION:  1. UPPER central non mass enhancement in the RIGHT breast, spanning 4.1 centimeters and correlating well with the distribution of calcifications seen mammographically. 2. The tissue marker clips mark the anterior and posterior aspects of the enhancement. 3. No new or suspicious findings in the LEFT breast. RECOMMENDATION: Treatment plan for known RIGHT breast malignancy. BI-RADS CATEGORY  6: Known biopsy-proven malignancy. Electronically Signed   By: Nolon Nations M.D.   On: 07/12/2020 13:58   DG Chest Port 1 View  Result Date: 07/23/2020 CLINICAL DATA:  Status post Port-A-Cath placement EXAM: PORTABLE CHEST 1 VIEW COMPARISON:  None. FINDINGS: Left chest port with tip in the mid SVC. No pneumothorax. Minimal kink of the tubing just under the clavicle. Low lung volumes. Heart is upper normal in size likely accentuated by technique. Streaky opacities in the right mid lung favor atelectasis or scarring. No pleural fluid. No pulmonary edema. No acute osseous abnormalities are seen. IMPRESSION: 1. Tip of the left chest port in the mid SVC.  No pneumothorax. 2. Streaky opacities in the right mid lung favor atelectasis or scarring. 3. Upper normal heart size likely accentuated by technique. Electronically Signed   By: Keith Rake M.D.   On: 07/23/2020 15:07   DG Fluoro Guide CV Line-No Report  Result Date: 07/23/2020 Fluoroscopy was utilized by the requesting physician.  No radiographic interpretation.   ECHOCARDIOGRAM COMPLETE  Result Date: 07/19/2020    ECHOCARDIOGRAM REPORT   Patient Name:   Sandy Goodwin Date of Exam: 07/19/2020 Medical Rec #:  [id number]  Height:       65.5 in Accession #:    [id number] Weight:       202.2 lb Date of Birth:  February 12, 1971  BSA:          1.999 m Patient Age:    15 years   BP:  131/79 mmHg Patient Gender: F          HR:           61 bpm. Exam Location:  Outpatient Procedure: 2D Echo, Cardiac Doppler, Color Doppler and Strain Analysis Indications:    Chemo Z09   History:        Patient has no prior history of Echocardiogram examinations.  Sonographer:    Bernadene Person RDCS Referring Phys: [id number] Nicholas Lose IMPRESSIONS  1. Left ventricular ejection fraction, by estimation, is 60 to 65%. The left ventricle has normal function. The left ventricle has no regional wall motion abnormalities. Left ventricular diastolic parameters were normal. The average left ventricular global longitudinal strain is -24.5 %. The global longitudinal strain is normal.  2. Right ventricular systolic function is normal. The right ventricular size is normal. There is normal pulmonary artery systolic pressure.  3. The mitral valve is normal in structure. No evidence of mitral valve regurgitation.  4. The aortic valve is tricuspid. Aortic valve regurgitation is not visualized. No aortic stenosis is present.  5. The inferior vena cava is normal in size with greater than 50% respiratory variability, suggesting right atrial pressure of 3 mmHg. Comparison(s): No prior Echocardiogram. Conclusion(s)/Recommendation(s): Normal biventricular function without evidence of hemodynamically significant valvular heart disease. FINDINGS  Left Ventricle: Left ventricular ejection fraction, by estimation, is 60 to 65%. The left ventricle has normal function. The left ventricle has no regional wall motion abnormalities. The average left ventricular global longitudinal strain is -24.5 %. The global longitudinal strain is normal. The left ventricular internal cavity size was normal in size. There is no left ventricular hypertrophy. Left ventricular diastolic parameters were normal. Right Ventricle: The right ventricular size is normal. No increase in right ventricular wall thickness. Right ventricular systolic function is normal. There is normal pulmonary artery systolic pressure. The tricuspid regurgitant velocity is 1.72 m/s, and  with an assumed right atrial pressure of 3 mmHg, the estimated right ventricular  systolic pressure is 25.9 mmHg. Left Atrium: Left atrial size was normal in size. Right Atrium: Right atrial size was normal in size. Pericardium: Trivial pericardial effusion is present. Mitral Valve: The mitral valve is normal in structure. No evidence of mitral valve regurgitation. Tricuspid Valve: The tricuspid valve is normal in structure. Tricuspid valve regurgitation is trivial. Aortic Valve: The aortic valve is tricuspid. Aortic valve regurgitation is not visualized. No aortic stenosis is present. Pulmonic Valve: The pulmonic valve was not well visualized. Pulmonic valve regurgitation is trivial. Aorta: The aortic root, ascending aorta, aortic arch and descending aorta are all structurally normal, with no evidence of dilitation or obstruction. Venous: The inferior vena cava is normal in size with greater than 50% respiratory variability, suggesting right atrial pressure of 3 mmHg. IAS/Shunts: The atrial septum is grossly normal.  LEFT VENTRICLE PLAX 2D LVIDd:         5.00 cm  Diastology LVIDs:         3.00 cm  LV e' medial:    11.70 cm/s LV PW:         0.80 cm  LV E/e' medial:  8.7 LV IVS:        0.80 cm  LV e' lateral:   12.30 cm/s LVOT diam:     1.90 cm  LV E/e' lateral: 8.3 LV SV:         77 LV SV Index:   38       2D Longitudinal Strain LVOT Area:  2.84 cm 2D Strain GLS (A2C):   -25.6 %                         2D Strain GLS (A3C):   -22.3 %                         2D Strain GLS (A4C):   -25.6 %                         2D Strain GLS Avg:     -24.5 % RIGHT VENTRICLE RV S prime:     14.30 cm/s TAPSE (M-mode): 3.4 cm LEFT ATRIUM             Index       RIGHT ATRIUM           Index LA diam:        2.60 cm 1.30 cm/m  RA Area:     14.00 cm LA Vol (A2C):   27.4 ml 13.71 ml/m RA Volume:   29.40 ml  14.71 ml/m LA Vol (A4C):   25.3 ml 12.66 ml/m LA Biplane Vol: 26.5 ml 13.26 ml/m  AORTIC VALVE LVOT Vmax:   123.00 cm/s LVOT Vmean:  83.200 cm/s LVOT VTI:    0.271 m  AORTA Ao Root diam: 3.20 cm Ao Asc diam:   3.00 cm MITRAL VALVE                TRICUSPID VALVE MV Area (PHT): 3.31 cm     TR Peak grad:   11.8 mmHg MV Decel Time: 229 msec     TR Vmax:        172.00 cm/s MV E velocity: 102.00 cm/s MV A velocity: 64.00 cm/s   SHUNTS MV E/A ratio:  1.59         Systemic VTI:  0.27 m                             Systemic Diam: 1.90 cm Buford Dresser MD Electronically signed by Buford Dresser MD Signature Date/Time: 07/19/2020/8:01:22 PM    Final    MM CLIP PLACEMENT RIGHT  Result Date: 07/08/2020 CLINICAL DATA:  Evaluate post biopsy marker clip placement following stereotactic core needle biopsy of the anterior and posterior extents of right breast calcifications. EXAM: DIAGNOSTIC RIGHT MAMMOGRAM POST STEREOTACTIC BIOPSY COMPARISON:  Previous exam(s). FINDINGS: Mammographic images were obtained following stereotactic guided biopsy of right breast calcifications. Both biopsy marker clips are well positioned along the anterior and posterior extents of the suspicious upper outer quadrant calcifications. IMPRESSION: Appropriate positioning of the coil and X shaped biopsy clips following stereotactic core needle biopsy of the right breast. Final Assessment: Post Procedure Mammograms for Marker Placement Electronically Signed   By: Lajean Manes M.D.   On: 07/08/2020 09:31   MM RT BREAST BX W LOC DEV 1ST LESION IMAGE BX SPEC STEREO GUIDE  Addendum Date: 07/09/2020   ADDENDUM REPORT: 07/09/2020 11:29 ADDENDUM: Pathology revealed GRADE III INVASIVE DUCTAL CARCINOMA, HIGH GRADE DUCTAL CARCINOMA IN SITU WITH NECROSIS of the Right breast, upper outer quadrant, posterior. This was found to be concordant by Dr. Lajean Manes. Pathology revealed HIGH GRADE DUCTAL CARCINOMA IN SITU WITH NECROSIS, MICROSCOPIC FOCUS SUSPICIOUS FOR INVASIVE DUCTAL CARCINOMA of the Right breast, upper outer quadrant, anterior. This was found to be concordant by Dr. Lajean Manes. Pathology  results were discussed with the patient by telephone.  The patient reported doing well after the biopsies with tenderness at the sites. Post biopsy instructions and care were reviewed and questions were answered. The patient was encouraged to call [facility] for any additional concerns. My direct phone number was provided. The patient was referred to [facility] at [facility] [place] on July 17, 2020. The patient is scheduled for a Right axillary ultrasound at [facility] on July 10, 2020. Consideration for a bilateral breast MRI for further evaluation of extent of disease given the high grade histology, age, and strong family history of breast cancer. Pathology results were discussed with Peyton Bottoms RT-R, at [facility] on July 09, 2020. Pathology results reported by Terie Purser, RN on 07/09/2020. Electronically Signed   By: Lajean Manes M.D.   On: 07/09/2020 11:29   Result Date: 07/09/2020 CLINICAL DATA:  Patient presents for stereotactic core needle biopsy of suspicious right breast calcifications. Two biopsies performed, the anterior and posterior extents of the calcifications. EXAM: RIGHT BREAST STEREOTACTIC CORE NEEDLE BIOPSY: 2 STEREOTACTIC CORE BIOPSIES PERFORMED. COMPARISON:  Previous exams. FINDINGS: The patient and I discussed the procedure of stereotactic-guided biopsy including benefits and alternatives. We discussed the high likelihood of a successful procedure. We discussed the risks of the procedure including infection, bleeding, tissue injury, clip migration, and inadequate sampling. Informed written consent was given. The usual time out protocol was performed immediately prior to the procedure. Biopsy #1: Calcifications: Posterior extent. Using sterile technique and 1% Lidocaine as local anesthetic, under stereotactic guidance, a 9 gauge vacuum assisted device was used to perform core needle biopsy of calcifications in the upper outer  quadrant the right breast, posterior extent, using a superior approach. Specimen radiograph was performed showing calcifications for which biopsy was performed. Specimens with calcifications are identified for pathology. Lesion quadrant: Upper outer quadrant At the conclusion of the procedure, coil shaped tissue marker clip was deployed into the biopsy cavity. Biopsy #2: Calcifications: Anterior extent. Using sterile technique and 1% Lidocaine as local anesthetic, under stereotactic guidance, a 9 gauge vacuum assisted device was used to perform core needle biopsy of calcifications in the upper outer quadrant of the right breast, anterior extent, using a superior approach. Specimen radiograph was performed showing calcifications for which biopsy was performed. Specimens with calcifications are identified for pathology. Lesion quadrant: Upper outer quadrant At the conclusion of the procedure, X shaped tissue marker clip was deployed into the biopsy cavity. Follow-up 2-view mammogram was performed and dictated separately. IMPRESSION: Stereotactic-guided biopsy of the anterior and posterior extents of suspicious calcifications in the upper outer right breast. No apparent complications. Electronically Signed: By: Lajean Manes M.D. On: 07/08/2020 09:23   MM RT BREAST BX W LOC DEV EA AD LESION IMG BX SPEC STEREO GUIDE  Addendum Date: 07/09/2020   ADDENDUM REPORT: 07/09/2020 11:29 ADDENDUM: Pathology revealed GRADE III INVASIVE DUCTAL CARCINOMA, HIGH GRADE DUCTAL CARCINOMA IN SITU WITH NECROSIS of the Right breast, upper outer quadrant, posterior. This was found to be concordant by Dr. Lajean Manes. Pathology revealed HIGH GRADE DUCTAL CARCINOMA IN SITU WITH NECROSIS, MICROSCOPIC FOCUS SUSPICIOUS FOR INVASIVE DUCTAL CARCINOMA of the Right breast, upper outer quadrant, anterior. This was found to be concordant by Dr. Lajean Manes. Pathology results were discussed with the patient by telephone. The patient reported doing  well after the biopsies with tenderness at the sites. Post biopsy instructions and care were  reviewed and questions were answered. The patient was encouraged to call [facility] for any additional concerns. My direct phone number was provided. The patient was referred to [facility] at [facility] on July 17, 2020. The patient is scheduled for a Right axillary ultrasound at [place] on July 10, 2020. Consideration for a bilateral breast MRI for further evaluation of extent of disease given the high grade histology, age, and strong family history of breast cancer. Pathology results were discussed with Peyton Bottoms RT-R, at [facility] on July 09, 2020. Pathology results reported by Terie Purser, RN on 07/09/2020. Electronically Signed   By: Lajean Manes M.D.   On: 07/09/2020 11:29   Result Date: 07/09/2020 CLINICAL DATA:  Patient presents for stereotactic core needle biopsy of suspicious right breast calcifications. Two biopsies performed, the anterior and posterior extents of the calcifications. EXAM: RIGHT BREAST STEREOTACTIC CORE NEEDLE BIOPSY: 2 STEREOTACTIC CORE BIOPSIES PERFORMED. COMPARISON:  Previous exams. FINDINGS: The patient and I discussed the procedure of stereotactic-guided biopsy including benefits and alternatives. We discussed the high likelihood of a successful procedure. We discussed the risks of the procedure including infection, bleeding, tissue injury, clip migration, and inadequate sampling. Informed written consent was given. The usual time out protocol was performed immediately prior to the procedure. Biopsy #1: Calcifications: Posterior extent. Using sterile technique and 1% Lidocaine as local anesthetic, under stereotactic guidance, a 9 gauge vacuum assisted device was used to perform core needle biopsy of calcifications in the upper outer quadrant the right breast,  posterior extent, using a superior approach. Specimen radiograph was performed showing calcifications for which biopsy was performed. Specimens with calcifications are identified for pathology. Lesion quadrant: Upper outer quadrant At the conclusion of the procedure, coil shaped tissue marker clip was deployed into the biopsy cavity. Biopsy #2: Calcifications: Anterior extent. Using sterile technique and 1% Lidocaine as local anesthetic, under stereotactic guidance, a 9 gauge vacuum assisted device was used to perform core needle biopsy of calcifications in the upper outer quadrant of the right breast, anterior extent, using a superior approach. Specimen radiograph was performed showing calcifications for which biopsy was performed. Specimens with calcifications are identified for pathology. Lesion quadrant: Upper outer quadrant At the conclusion of the procedure, X shaped tissue marker clip was deployed into the biopsy cavity. Follow-up 2-view mammogram was performed and dictated separately. IMPRESSION: Stereotactic-guided biopsy of the anterior and posterior extents of suspicious calcifications in the upper outer right breast. No apparent complications. Electronically Signed: By: Lajean Manes M.D. On: 07/08/2020 09:23

## 2020-07-31 NOTE — ED Notes (Addendum)
Pt taken to xray 

## 2020-07-31 NOTE — ED Provider Notes (Addendum)
[facility] DEPT Provider Note   CSN: [id number] Arrival date & time: 07/31/20  1212     History Chief Complaint  Patient presents with  . Fever    Sandy Goodwin is a 50 y.o. female.  HPI   Pt is a 50 y/o F with a h/o arthritis, breast CA, who presents to the ED today for eval of fever up to 102F at home. States she had her first chemo last week. States that she started having diarrhea a few days ago. Denies abd pain, vomiting, rhinorrhea, congestion, sore throat, cough, sob, chest pain. She has some dysuria. States that after the chemo she started to have dysuria and mouth/throat pain. She was dx with mucositis and started on meds for this. The sxs have improved somewhat but have not completely resolved.  Oncologist: Dr. Lindi Adie  Past Medical History:  Diagnosis Date  . Arthritis   . Breast cancer ([place])   . Complication of anesthesia    Itching  . Dysrhythmia    Tachycardia  . Hypothyroidism     Patient Active Problem List   Diagnosis Date Noted  . Genetic testing 07/17/2020  . Malignant neoplasm of upper-outer quadrant of right breast in female, estrogen receptor positive ([place]) 07/12/2020  . Change in bowel habits 06/12/2016    Past Surgical History:  Procedure Laterality Date  . ABDOMINAL HYSTERECTOMY    . APPENDECTOMY  2008  . Bowel Rupture Repair  2008   Ruptured during appendectomy  . Breast Biopsy Right 1999  . COLONOSCOPY N/A 06/15/2016   Procedure: COLONOSCOPY;  Surgeon: Rogene Houston, MD;  [facility];  Service: Endoscopy;  Laterality: N/A;  845  . IBF  [id number]  . PORTACATH PLACEMENT Left 07/23/2020   Procedure: INSERTION PORT-A-CATH;  Surgeon: Rolm Bookbinder, MD;  Location: [facility];  Service: General;  Laterality: Left;  . THYROIDECTOMY, PARTIAL Bilateral 2012     OB History   No obstetric history on file.     Family History  Problem Relation Age of Onset  . Hypertension Mother   .  Breast cancer Mother   . Lymphoma Mother   . Hypertension Brother   . Breast cancer Maternal Aunt   . Lymphoma Maternal Grandfather   . Breast cancer Maternal Aunt     Social History   Tobacco Use  . Smoking status: Never Smoker  . Smokeless tobacco: Never Used  Vaping Use  . Vaping Use: Never used  Substance Use Topics  . Alcohol use: No  . Drug use: No    Home Medications Prior to Admission medications   Medication Sig Start Date End Date Taking? Authorizing Provider  BYSTOLIC 5 MG tablet Take 1 tablet by mouth daily. 03/30/16   [provider]  cetirizine (ZYRTEC) 10 MG tablet Take 10 mg by mouth daily.    [provider]  clobetasol ointment (TEMOVATE) 0.05 % Apply topically 2 (two) times daily. 10/11/19   [provider]  dexamethasone (DECADRON) 4 MG tablet Take 1 tablet (4 mg total) by mouth daily. Take 1 tablet day before chemo and 1 tablet day after chemo with food 07/17/20   Nicholas Lose, MD  doxycycline (VIBRAMYCIN) 50 MG capsule Take 50 mg by mouth daily.    [provider]  levothyroxine (SYNTHROID, LEVOTHROID) 50 MCG tablet Take 50 mcg by mouth daily before breakfast.    [provider]  lidocaine (XYLOCAINE) 2 % solution 5 ml swish and spit q  3 hours prn mouth pain 07/29/20   Harle Stanford., PA-C  lidocaine-prilocaine (EMLA) cream Apply to affected area once 07/17/20   Nicholas Lose, MD  LORazepam (ATIVAN) 0.5 MG tablet Take 1 tablet (0.$RemoveBef'5mg'gISnjfLkwZ$  tablet) at bedtime, as needed for nausea. 07/22/20   Nicholas Lose, MD  magic mouthwash (nystatin, diphenhydrAMINE, alum & mag hydroxide) suspension mixture Swish and spit 5 mLs 4 (four) times daily as needed for mouth pain. 07/29/20   Tanner, Lyndon Code., PA-C  meloxicam (MOBIC) 15 MG tablet Take 15 mg by mouth daily.    [provider]  metroNIDAZOLE (METROCREAM) 0.75 % cream Apply 1 application topically 2 (two) times daily.    [provider]  ondansetron (ZOFRAN) 8 MG tablet  Take 1 tablet (8 mg total) by mouth 2 (two) times daily as needed (Nausea or vomiting). Start on the third day after chemotherapy. 07/17/20   Nicholas Lose, MD  phenazopyridine (PYRIDIUM) 200 MG tablet Take 1 tablet (200 mg total) by mouth 3 (three) times daily as needed for pain. 07/29/20   Tanner, Lyndon Code., PA-C  prochlorperazine (COMPAZINE) 10 MG tablet Take 1 tablet (10 mg total) by mouth every 6 (six) hours as needed (Nausea or vomiting). 07/17/20   Nicholas Lose, MD  traMADol (ULTRAM) 50 MG tablet Take 1 tablet (50 mg total) by mouth every 6 (six) hours as needed. 07/23/20   Rolm Bookbinder, MD  triamcinolone (NASACORT) 55 MCG/ACT AERO nasal inhaler Place 2 sprays into the nose daily.    [provider]  Turmeric 500 MG CAPS Take by mouth.    [provider]  valACYclovir (VALTREX) 1000 MG tablet Take 1 tablet (1,000 mg total) by mouth 2 (two) times daily. 07/29/20   Tanner, Lyndon Code., PA-C    Allergies    Codeine and Hydrocodone  Review of Systems   Review of Systems  Constitutional: Positive for chills, diaphoresis and fever.  Eyes: Negative for visual disturbance.  Respiratory: Negative for cough and shortness of breath.   Cardiovascular: Negative for chest pain.  Gastrointestinal: Positive for diarrhea. Negative for abdominal pain, constipation, nausea and vomiting.  Genitourinary: Positive for dysuria.  Musculoskeletal: Negative for back pain.  Skin: Negative for color change and rash.  Neurological: Negative for headaches.  All other systems reviewed and are negative.   Physical Exam Updated Vital Signs BP 115/64   Pulse 82   Temp 98.2 F (36.8 C) (Oral)   Resp 20   SpO2 96%   Physical Exam Vitals and nursing note reviewed.  Constitutional:      General: She is not in acute distress.    Appearance: She is well-developed.  HENT:     Head: Normocephalic and atraumatic.  Eyes:     Conjunctiva/sclera: Conjunctivae normal.  Cardiovascular:     Rate and  Rhythm: Normal rate and regular rhythm.     Heart sounds: Normal heart sounds. No murmur heard.   Pulmonary:     Effort: Pulmonary effort is normal. No respiratory distress.     Breath sounds: Normal breath sounds. No wheezing, rhonchi or rales.  Abdominal:     General: Bowel sounds are normal.     Palpations: Abdomen is soft.     Tenderness: There is no abdominal tenderness. There is no guarding or rebound.  Musculoskeletal:     Cervical back: Neck supple.  Skin:    General: Skin is warm and dry.  Neurological:     Mental Status: She is alert.  ED Results / Procedures / Treatments   Labs (all labs ordered are listed, but only abnormal results are displayed) Labs Reviewed  CBC WITH DIFFERENTIAL/PLATELET - Abnormal; Notable for the following components:      Result Value   nRBC 0.6 (*)    Monocytes Absolute 1.6 (*)    Abs Immature Granulocytes 1.44 (*)    All other components within normal limits  URINALYSIS, ROUTINE W REFLEX MICROSCOPIC - Abnormal; Notable for the following components:   Color, Urine AMBER (*)    Nitrite POSITIVE (*)    All other components within normal limits  COMPREHENSIVE METABOLIC PANEL - Abnormal; Notable for the following components:   ALT 68 (*)    Alkaline Phosphatase 134 (*)    All other components within normal limits  RESP PANEL BY RT-PCR (FLU A&B, COVID) ARPGX2  CULTURE, BLOOD (ROUTINE X 2)  CULTURE, BLOOD (ROUTINE X 2)  URINE CULTURE  LACTIC ACID, PLASMA  LACTIC ACID, PLASMA  I-STAT BETA HCG BLOOD, ED ([facility], [facility], [facility] ONLY)    EKG None  Radiology DG Chest 2 View  Result Date: 07/31/2020 CLINICAL DATA:  Fever last night. First round of chemotherapy 1 week ago. EXAM: CHEST - 2 VIEW COMPARISON:  One-view chest x-ray 07/23/2020 FINDINGS: Left subclavian Port-A-Cath is stable in position. Heart size is. Lung volumes are low. No edema or effusion is present. No focal airspace disease is present. Visualized soft tissues and bony thorax are  unremarkable. IMPRESSION: 1. Low lung volumes. 2. No acute cardiopulmonary disease. Electronically Signed   By: San Morelle M.D.   On: 07/31/2020 13:48    Procedures Procedures   Medications Ordered in ED Medications  sodium chloride 0.9 % bolus 1,000 mL (1,000 mLs Intravenous Bolus 07/31/20 1330)    ED Course  I have reviewed the triage vital signs and the nursing notes.  Pertinent labs & imaging results that were available during my care of the patient were reviewed by me and considered in my medical decision making (see chart for details).    MDM Rules/Calculators/A&P                          50 year old female with history of breast cancer presenting for evaluation of fever.  First chemo was given last week.  She started having diarrhea a few days ago.  She also is recently been diagnosed with mucositis.  Denies any other associated symptoms.  Abdomen is soft and nontender.  She is nontoxic and well-appearing on exam.  Lungs clear to auscultation bilaterally.  Will order infectious work-up and reassess.  Reviewed/interpreted labs CBC is without leukocytosis, she has a mild left shift, no anemia CMP with mild elevation in ALT and alk phos Lactic acid is negative Beta-hCG is negative COVID/flu testing is negative UA is nitrite positive but there are no leukocytes, blood or bacteria.  Feel this is likely false positive from taking Pyridium, doubt UTI.  Urine culture has been sent for confirmation. Blood cultures were obtained given patient's history of immunosuppression, though at this time have lower suspicion for bacteremia. cdif obtained and gi panel obtained. Will f/u on results of cdif  Chest x-ray reviewed/interpreted - 1. Low lung volumes. 2. No acute cardiopulmonary disease.  3:36 PM CONSULT with Dr. Alen Blew with oncology who reviewed the patient's history and work-up.  He does not feel that the patient needs antibiotics or admission at this time and recommended  supportive care and hydration.  Recommended  close follow-up with oncology.  Have reassessed the patient, she has been able to tolerate p.o. here in the ED.  She continues to appear nontoxic nonseptic appearing.  We discussed the work-up thus far and plan for discharge home with close follow-up with oncology.  Advised on follow-up and return precautions.  She voices understanding of plan and reasons to return.  All questions answered.  Patient stable for discharge.  Pt updated by telephone that cdif negative.   Final Clinical Impression(s) / ED Diagnoses Final diagnoses:  Febrile illness    Rx / DC Orders ED Discharge Orders    None       Rodney Booze, PA-C 07/31/20 1601    Rodney Booze, PA-C 07/31/20 2210    Luna Fuse, MD 08/02/20 [phone]

## 2020-08-01 ENCOUNTER — Inpatient Hospital Stay: Payer: BC Managed Care – PPO

## 2020-08-01 ENCOUNTER — Other Ambulatory Visit (HOSPITAL_COMMUNITY): Payer: BC Managed Care – PPO

## 2020-08-01 LAB — GASTROINTESTINAL PANEL BY PCR, STOOL (REPLACES STOOL CULTURE)

## 2020-08-02 ENCOUNTER — Other Ambulatory Visit: Payer: Self-pay | Admitting: *Deleted

## 2020-08-02 DIAGNOSIS — C50411 Malignant neoplasm of upper-outer quadrant of right female breast: Secondary | ICD-10-CM

## 2020-08-02 DIAGNOSIS — Z170 Estrogen receptor positive status [ER+]: Secondary | ICD-10-CM

## 2020-08-02 LAB — URINE CULTURE: Culture: 10000 — AB

## 2020-08-03 ENCOUNTER — Telehealth: Payer: Self-pay

## 2020-08-03 NOTE — Telephone Encounter (Signed)
No treatment for [facility] ED 07/31/20 per Dr Eulis Foster

## 2020-08-03 NOTE — Progress Notes (Signed)
ED Antimicrobial Stewardship Positive Culture Follow Up   Sandy Goodwin is an 50 y.o. female who presented to [facility] on 07/31/2020 with a chief complaint of  Chief Complaint  Patient presents with  . Fever   Patient with a history of breast cancer recently started on chemotherapy. Oncology visit 4/18 for mucositis and dysuria attributed to mucositis per oncology note. Treated with oral agents for mucositis and Pyridium. Seen in ED 4/20. U/A positive for nitrites (possibly attributable to Pyridium) but negative for bacteria, leukocytes, and WBC 0-5.   Recent Results (from the past 720 hour(s))  SARS CORONAVIRUS 2 (TAT 6-24 HRS) Nasopharyngeal Nasopharyngeal Swab     Status: None   Collection Time: 07/22/20  9:05 AM   Specimen: Nasopharyngeal Swab  Result Value Ref Range Status   SARS Coronavirus 2 NEGATIVE NEGATIVE Final    Comment: (NOTE) SARS-CoV-2 target nucleic acids are NOT DETECTED.  The SARS-CoV-2 RNA is generally detectable in upper and lower respiratory specimens during the acute phase of infection. Negative results do not preclude SARS-CoV-2 infection, do not rule out co-infections with other pathogens, and should not be used as the sole basis for treatment or other patient management decisions. Negative results must be combined with clinical observations, patient history, and epidemiological information. The expected result is Negative.  Fact Sheet for Patients: SugarRoll.be  Fact Sheet for Healthcare Providers: https://www.woods-mathews.com/  This test is not yet approved or cleared by the [place] FDA and  has been authorized for detection and/or diagnosis of SARS-CoV-2 by FDA under an Emergency Use Authorization (EUA). This EUA will remain  in effect (meaning this test can be used) for the duration of the COVID-19 declaration under Se ction 564(b)(1) of the Act, 21 U.S.C. section 360bbb-3(b)(1), unless the authorization  is terminated or revoked sooner.  Performed at [facility], [place] [address] [place], [place] [address]   Urine Culture     Status: None   Collection Time: 07/29/20  3:00 PM   Specimen: Urine, Clean Catch  Result Value Ref Range Status   Specimen Description   Final    URINE, CLEAN CATCH Performed at [facility], [place] [address], [place], [place] [address]    Special Requests   Final    URINE, CLEAN CATCH Performed at [facility], [place] [address], [place], [place] [address]    Culture   Final    NO GROWTH Performed at [facility], [place] [address], [place], [place] [address]    Report Status 07/31/2020 FINAL  Final  Culture, blood (routine x 2)     Status: None (Preliminary result)   Collection Time: 07/31/20  1:00 PM   Specimen: BLOOD  Result Value Ref Range Status   Specimen Description   Final    BLOOD LEFT ANTECUBITAL Performed at [facility] [address], [place], [place] [address]    Special Requests   Final    BOTTLES DRAWN AEROBIC AND ANAEROBIC Blood Culture results may not be optimal due to an excessive volume of blood received in culture bottles   Culture   Final    NO GROWTH 3 DAYS Performed at [facility], [place] [address] [place],  [address]    Report Status PENDING  Incomplete  Urine culture     Status: Abnormal   Collection Time: 07/31/20  1:00 PM   Specimen: Urine, Clean Catch  Result Value Ref Range Status  Specimen Description   Final    URINE, CLEAN CATCH Performed at [facility], [place] [address] [place], [place] [address]    Special Requests   Final    NONE Performed at [facility], [place] [address], [place], [place] [address]    Culture (A)  Final    10,000 COLONIES/mL DIPHTHEROIDS(CORYNEBACTERIUM SPECIES) Standardized susceptibility testing for this organism is not available. Performed at [facility], [place] [address], [place], [place] [address]    Report Status 08/02/2020 FINAL  Final  Resp Panel by RT-PCR (Flu A&B, Covid) Nasopharyngeal Swab     Status: None   Collection Time: 07/31/20  1:01 PM   Specimen: Nasopharyngeal Swab; Nasopharyngeal(NP) swabs in vial transport medium  Result Value Ref Range Status   SARS Coronavirus 2 by RT PCR NEGATIVE NEGATIVE Final    Comment: (NOTE) SARS-CoV-2 target nucleic acids are NOT DETECTED.  The SARS-CoV-2 RNA is generally detectable in upper respiratory specimens during the acute phase of infection. The lowest concentration of SARS-CoV-2 viral copies this assay can detect is 138 copies/mL. A negative result does not preclude SARS-Cov-2 infection and should not be used as the sole basis for treatment or other patient management decisions. A negative result may occur with  improper specimen collection/handling, submission of specimen other than nasopharyngeal swab, presence of viral mutation(s) within the areas targeted by this assay, and inadequate number of viral copies(<138 copies/mL). A negative result must be combined with clinical observations, patient history, and epidemiological information. The expected result is Negative.  Fact Sheet for Patients:  EntrepreneurPulse.com.au  Fact Sheet for Healthcare Providers:  IncredibleEmployment.be  This test is no t yet approved or cleared by the [place] FDA and  has been authorized for detection and/or diagnosis of SARS-CoV-2 by FDA under an Emergency Use Authorization (EUA). This EUA will remain  in effect (meaning this test can be used) for the duration of the COVID-19 declaration under Section 564(b)(1) of the Act, 21 U.S.C.section 360bbb-3(b)(1), unless the authorization is terminated  or revoked sooner.       Influenza A by PCR NEGATIVE NEGATIVE Final   Influenza B by PCR NEGATIVE NEGATIVE Final    Comment: (NOTE) The Xpert Xpress  SARS-CoV-2/FLU/RSV plus assay is intended as an aid in the diagnosis of influenza from Nasopharyngeal swab specimens and should not be used as a sole basis for treatment. Nasal washings and aspirates are unacceptable for Xpert Xpress SARS-CoV-2/FLU/RSV testing.  Fact Sheet for Patients: EntrepreneurPulse.com.au  Fact Sheet for Healthcare Providers: IncredibleEmployment.be  This test is not yet approved or cleared by the [place] FDA and has been authorized for detection and/or diagnosis of SARS-CoV-2 by FDA under an Emergency Use Authorization (EUA). This EUA will remain in effect (meaning this test can be used) for the duration of the COVID-19 declaration under Section 564(b)(1) of the Act, 21 U.S.C. section 360bbb-3(b)(1), unless the authorization is terminated or revoked.  Performed at [facility], [place] [address], [place], [place] [address]   Culture, blood (routine x 2)     Status: None (Preliminary result)   Collection Time: 07/31/20  1:15 PM   Specimen: BLOOD  Result Value Ref Range Status   Specimen Description   Final    BLOOD PORTA CATH Performed at [facility] [address] [place],  [address]    Special Requests   Final    BOTTLES DRAWN AEROBIC AND ANAEROBIC Blood Culture adequate volume Performed  at [facility], [place] [address] [place], [place] [address]    Culture   Final    NO GROWTH 3 DAYS Performed at [facility], [place] [address] [place], [place] [address]    Report Status PENDING  Incomplete  C Difficile Quick Screen w PCR reflex     Status: None   Collection Time: 07/31/20  4:13 PM   Specimen: STOOL  Result Value Ref Range Status   C Diff antigen NEGATIVE NEGATIVE Final   C Diff toxin NEGATIVE NEGATIVE Final   C Diff interpretation No C. difficile detected.  Final    Comment: Performed at [facility], [place] [address] [place], [place] [address]  Gastrointestinal Panel by PCR , Stool     Status: None   Collection Time: 07/31/20  4:13 PM   Specimen: STOOL  Result Value Ref Range Status   Campylobacter species NOT DETECTED NOT DETECTED Final   Plesimonas shigelloides NOT DETECTED NOT DETECTED Final   Salmonella species NOT DETECTED NOT DETECTED Final   Yersinia enterocolitica NOT DETECTED NOT DETECTED Final   Vibrio species NOT DETECTED NOT DETECTED Final   Vibrio cholerae NOT DETECTED NOT DETECTED Final   Enteroaggregative E coli (EAEC) NOT DETECTED NOT DETECTED Final   Enteropathogenic E coli (EPEC) NOT DETECTED NOT DETECTED Final   Enterotoxigenic E coli (ETEC) NOT DETECTED NOT DETECTED Final   Shiga like toxin producing E coli (STEC) NOT DETECTED NOT DETECTED Final   Shigella/Enteroinvasive E coli (EIEC) NOT DETECTED NOT DETECTED Final   Cryptosporidium NOT DETECTED NOT DETECTED Final   Cyclospora cayetanensis NOT DETECTED NOT DETECTED Final   Entamoeba histolytica NOT DETECTED NOT DETECTED Final   Giardia lamblia NOT DETECTED NOT DETECTED Final   Adenovirus F40/41 NOT DETECTED NOT DETECTED Final   Astrovirus NOT DETECTED NOT DETECTED Final   Norovirus GI/GII NOT DETECTED NOT DETECTED Final   Rotavirus A NOT DETECTED NOT DETECTED Final   Sapovirus (I, II, IV, and V) NOT DETECTED NOT DETECTED Final    Comment: Performed at [facility], [place]., [place],  [address]    []  Treated with organism resistant to prescribed antimicrobial [x]  Patient discharged originally without antimicrobial agent  New antibiotic prescription: None indicated  ED Provider: Dr. Meribeth Mattes D 08/03/2020, 9:19 AM Clinical Pharmacist [phone]

## 2020-08-05 ENCOUNTER — Encounter: Payer: Self-pay | Admitting: *Deleted

## 2020-08-05 ENCOUNTER — Inpatient Hospital Stay: Payer: BC Managed Care – PPO

## 2020-08-05 ENCOUNTER — Other Ambulatory Visit: Payer: Self-pay

## 2020-08-05 ENCOUNTER — Inpatient Hospital Stay (HOSPITAL_BASED_OUTPATIENT_CLINIC_OR_DEPARTMENT_OTHER): Payer: BC Managed Care – PPO | Admitting: Hematology and Oncology

## 2020-08-05 DIAGNOSIS — C50411 Malignant neoplasm of upper-outer quadrant of right female breast: Secondary | ICD-10-CM

## 2020-08-05 DIAGNOSIS — Z170 Estrogen receptor positive status [ER+]: Secondary | ICD-10-CM

## 2020-08-05 LAB — CMP (CANCER CENTER ONLY)
ALT: 29 U/L (ref 0–44)
AST: 20 U/L (ref 15–41)
Albumin: 4.1 g/dL (ref 3.5–5.0)
Alkaline Phosphatase: 114 U/L (ref 38–126)
Anion gap: 11 (ref 5–15)
BUN: 15 mg/dL (ref 6–20)
CO2: 23 mmol/L (ref 22–32)
Calcium: 8.7 mg/dL — ABNORMAL LOW (ref 8.9–10.3)
Chloride: 105 mmol/L (ref 98–111)
Creatinine: 0.67 mg/dL (ref 0.44–1.00)
GFR, Estimated: >60 mL/min (ref >60)
Glucose, Bld: 80 mg/dL (ref 70–99)
Potassium: 3.8 mmol/L (ref 3.5–5.1)
Sodium: 139 mmol/L (ref 135–145)
Total Bilirubin: 0.4 mg/dL (ref 0.3–1.2)
Total Protein: 7.3 g/dL (ref 6.5–8.1)

## 2020-08-05 LAB — CBC WITH DIFFERENTIAL (CANCER CENTER ONLY)
Abs Immature Granulocytes: 0.89 10*3/uL — ABNORMAL HIGH (ref 0.00–0.07)
Basophils Absolute: 0.1 10*3/uL (ref 0.0–0.1)
Basophils Relative: 1 %
Eosinophils Absolute: 0 10*3/uL (ref 0.0–0.5)
Eosinophils Relative: 0 %
HCT: 37 % (ref 36.0–46.0)
Hemoglobin: 12.2 g/dL (ref 12.0–15.0)
Immature Granulocytes: 9 %
Lymphocytes Relative: 25 %
Lymphs Abs: 2.6 10*3/uL (ref 0.7–4.0)
MCH: 30 pg (ref 26.0–34.0)
MCHC: 33 g/dL (ref 30.0–36.0)
MCV: 90.9 fL (ref 80.0–100.0)
Monocytes Absolute: 0.7 10*3/uL (ref 0.1–1.0)
Monocytes Relative: 6 %
Neutro Abs: 6.2 10*3/uL (ref 1.7–7.7)
Neutrophils Relative %: 59 %
Platelet Count: 207 10*3/uL (ref 150–400)
RBC: 4.07 MIL/uL (ref 3.87–5.11)
RDW: 13.9 % (ref 11.5–15.5)
WBC Count: 10.5 10*3/uL (ref 4.0–10.5)
nRBC: 0 % (ref 0.0–0.2)

## 2020-08-05 LAB — CULTURE, BLOOD (ROUTINE X 2)
Culture: NO GROWTH
Culture: NO GROWTH
Special Requests: ADEQUATE

## 2020-08-05 MED ORDER — TRAMADOL HCL 50 MG PO TABS: 50.0000 mg | ORAL_TABLET | Freq: Four times a day (QID) | ORAL | 0 refills | Status: DC | PRN

## 2020-08-05 NOTE — Progress Notes (Signed)
Patient Care Team: Caryl Bis, MD as PCP - General (Family Medicine) Mauro Kaufmann, RN as Oncology Nurse Navigator Rockwell Germany, RN as Oncology Nurse Navigator Rolm Bookbinder, MD as Consulting Physician (General Surgery) Nicholas Lose, MD as Consulting Physician (Hematology and Oncology) Kyung Rudd, MD as Consulting Physician (Radiation Oncology)  DIAGNOSIS:    ICD-10-CM   1. Malignant neoplasm of upper-outer quadrant of right breast in female, estrogen receptor positive ([place])  C50.411    Z17.0     SUMMARY OF ONCOLOGIC HISTORY: Oncology History  Malignant neoplasm of upper-outer quadrant of right breast in female, estrogen receptor positive ([place])  07/08/2020 Initial Diagnosis   Screening mammogram showed right breast calcifications. Diagnostic mammogram showed right breast calcifications spanning 4.1cm and no abnormal right axillary lymph nodes. 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   07/24/2020 -  Chemotherapy    Patient is on Treatment Plan: BREAST  DOCETAXEL + CARBOPLATIN + TRASTUZUMAB + PERTUZUMAB  (TCHP) Q21D         CHIEF COMPLIANT: Cycle 1 Day 13 Adriamycin and Cytoxan   INTERVAL HISTORY: Sandy Goodwin is a 50 y.o. with above-mentioned history of left breast cancer currently on neoadjuvant chemotherapy with TCH Perjeta. Echo on 07/19/20 showed an ejection fraction of 60-65%. Her port was placed by Dr. [name] on 07/23/20. She was seen in the ED on 07/31/20 for fever, malaise, and diarrhea. She presents to the clinic today for follow-up.    ALLERGIES:  is allergic to codeine and hydrocodone.  MEDICATIONS:  Current Outpatient Medications  Medication Sig Dispense Refill  . BYSTOLIC 5 MG tablet Take 1 tablet by mouth daily.  1  . cetirizine (ZYRTEC) 10 MG tablet Take 10 mg by mouth daily.    . clobetasol ointment (TEMOVATE) 0.05 % Apply topically 2 (two) times daily.    Marland Kitchen  dexamethasone (DECADRON) 4 MG tablet Take 1 tablet (4 mg total) by mouth daily. Take 1 tablet day before chemo and 1 tablet day after chemo with food 12 tablet 0  . doxycycline (VIBRAMYCIN) 50 MG capsule Take 50 mg by mouth daily.    Marland Kitchen levothyroxine (SYNTHROID, LEVOTHROID) 50 MCG tablet Take 50 mcg by mouth daily before breakfast.    . lidocaine (XYLOCAINE) 2 % solution 5 ml swish and spit q 3 hours prn mouth pain 200 mL 2  . lidocaine-prilocaine (EMLA) cream Apply to affected area once 30 g 3  . LORazepam (ATIVAN) 0.5 MG tablet Take 1 tablet (0.$RemoveBef'5mg'tMzYAoaCfo$  tablet) at bedtime, as needed for nausea. 30 tablet 0  . magic mouthwash (nystatin, diphenhydrAMINE, alum & mag hydroxide) suspension mixture Swish and spit 5 mLs 4 (four) times daily as needed for mouth pain. 240 mL 3  . meloxicam (MOBIC) 15 MG tablet Take 15 mg by mouth daily.    . metroNIDAZOLE (METROCREAM) 0.75 % cream Apply 1 application topically 2 (two) times daily.    . ondansetron (ZOFRAN) 8 MG tablet Take 1 tablet (8 mg total) by mouth 2 (two) times daily as needed (Nausea or vomiting). Start on the third day after chemotherapy. 30 tablet 1  . phenazopyridine (PYRIDIUM) 200 MG tablet Take 1 tablet (200 mg total) by mouth 3 (three) times daily as needed for pain. 21 tablet 1  . prochlorperazine (COMPAZINE) 10 MG tablet Take 1 tablet (10 mg total) by mouth every 6 (six) hours as needed (Nausea or vomiting). 30 tablet 1  .  traMADol (ULTRAM) 50 MG tablet Take 1 tablet (50 mg total) by mouth every 6 (six) hours as needed. 30 tablet 0  . triamcinolone (NASACORT) 55 MCG/ACT AERO nasal inhaler Place 2 sprays into the nose daily.    . Turmeric 500 MG CAPS Take by mouth.    . valACYclovir (VALTREX) 1000 MG tablet Take 1 tablet (1,000 mg total) by mouth 2 (two) times daily. 14 tablet 2   No current facility-administered medications for this visit.    PHYSICAL EXAMINATION: ECOG PERFORMANCE STATUS: 1 - Symptomatic but completely ambulatory  Vitals:    08/05/20 1330  BP: (!) 141/85  Pulse: 66  Resp: 18  Temp: 97.7 F (36.5 C)  SpO2: 99%   Filed Weights   08/05/20 1330  Weight: 197 lb 14.4 oz (89.8 kg)    LABORATORY DATA:  I have reviewed the data as listed CMP Latest Ref Rng & Units 08/05/2020 07/31/2020 07/17/2020  Glucose 70 - 99 mg/dL 80 89 143(H)  BUN 6 - 20 mg/dL $Remove'15 13 10  'aRBbpcl$ Creatinine 0.44 - 1.00 mg/dL 0.67 0.71 0.78  Sodium 135 - 145 mmol/L 139 140 139  Potassium 3.5 - 5.1 mmol/L 3.8 4.0 3.7  Chloride 98 - 111 mmol/L 105 106 102  CO2 22 - 32 mmol/L $RemoveB'23 24 26  'EGXcdXir$ Calcium 8.9 - 10.3 mg/dL 8.7(L) 9.3 8.5(L)  Total Protein 6.5 - 8.1 g/dL 7.3 7.5 7.2  Total Bilirubin 0.3 - 1.2 mg/dL 0.4 0.6 0.4  Alkaline Phos 38 - 126 U/L 114 134(H) 81  AST 15 - 41 U/L 20 41 21  ALT 0 - 44 U/L 29 68(H) 37    Lab Results  Component Value Date   WBC 10.5 08/05/2020   HGB 12.2 08/05/2020   HCT 37.0 08/05/2020   MCV 90.9 08/05/2020   PLT 207 08/05/2020   NEUTROABS 6.2 08/05/2020    ASSESSMENT & PLAN:  Malignant neoplasm of upper-outer quadrant of right breast in female, estrogen receptor positive ([place]) 07/08/2020: Screening mammogram showed right breast calcifications. Diagnostic mammogram showed right breast calcifications spanning 4.1cm and no abnormal right axillary lymph nodes. 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 Genetics done in 2019: Negative  Treatment plan: 1. Neoadjuvant chemotherapy with TCH Perjeta 6 cycles followed by Herceptin Perjeta maintenance versus Kadcyla maintenance (based on response to neoadjuvant chemo) for 1 year 2. Followed by breast conserving surgery if possible with sentinel lymph node study 3. 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 [facility] nausea  study --------------------------------------------------------------------------------------------------------------------------------------- Current treatment: Cycle 1 day 13 Chemo toxicities: 1.  Severe diarrhea: She did not take Imodium as prescribed because she got constipated and was very much afraid of taking Imodium.  Because of this she got dehydrated and required IV fluids 2. mucositis: We will decrease the dosage of Taxotere with the next cycle.  I had already reduced carboplatin dose to 700 mg. 3.  Bone pain: Tramadol helped and therefore sent a new prescription for tramadol.  Not much nausea. Return to clinic for cycle 2 chemo     No orders of the defined types were placed in this encounter.  The patient has a good understanding of the overall plan. she agrees with it. she will call with any problems that may develop before the next visit here.  Total time spent: 30 mins including face to  face time and time spent for planning, charting and coordination of care  Rulon Eisenmenger, MD, MPH 08/05/2020  I, Molly Dorshimer, am acting as scribe for Dr. Nicholas Lose.  I have reviewed the above documentation for accuracy and completeness, and I agree with the above.

## 2020-08-05 NOTE — Assessment & Plan Note (Addendum)
07/08/2020: Screening mammogram showed right breast calcifications. Diagnostic mammogram showed right breast calcifications spanning 4.1cm and no abnormal right axillary lymph nodes. 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 Genetics done in 2019: Negative  Treatment plan: 1. Neoadjuvant chemotherapy with TCH Perjeta 6 cycles followed by Herceptin Perjeta maintenance versus Kadcyla maintenance (based on response to neoadjuvant chemo) for 1 year 2. Followed by breast conserving surgery if possible with sentinel lymph node study 3. 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 [facility] nausea study --------------------------------------------------------------------------------------------------------------------------------------- Current treatment: Cycle 1 day 13 Chemo toxicities: 1.  Severe diarrhea: She did not take Imodium as prescribed because she got constipated and was very much afraid of taking Imodium.  Because of this she got dehydrated and required IV fluids 2. mucositis: We will decrease the dosage of Taxotere with the next cycle.  I had already reduced carboplatin dose to 700 mg. 3.  Bone pain: Tramadol helped and therefore sent a new prescription for tramadol.  Return to clinic for cycle 2 chemo

## 2020-08-08 ENCOUNTER — Telehealth: Payer: Self-pay | Admitting: Hematology and Oncology

## 2020-08-08 NOTE — Telephone Encounter (Signed)
Scheduled per 4/25 los. Pt will receive an updated appt calendar  

## 2020-08-13 ENCOUNTER — Other Ambulatory Visit: Payer: Self-pay

## 2020-08-13 DIAGNOSIS — C50411 Malignant neoplasm of upper-outer quadrant of right female breast: Secondary | ICD-10-CM

## 2020-08-13 DIAGNOSIS — Z170 Estrogen receptor positive status [ER+]: Secondary | ICD-10-CM

## 2020-08-13 NOTE — Assessment & Plan Note (Signed)
07/08/2020:Screening mammogram showed right breast calcifications. Diagnostic mammogram showed right breast calcifications spanning 4.1cm and no abnormal right axillary lymph nodes. 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 Genetics done in 2019: Negative  Treatment plan: 1. Neoadjuvant chemotherapy with TCH Perjeta 6 cycles followed by Herceptin Perjeta maintenance versus Kadcyla maintenance (based on response to neoadjuvant chemo) for 1 year 2. Followed by breast conserving surgery if possible with sentinel lymph node study 3. 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 [facility] nausea study --------------------------------------------------------------------------------------------------------------------------------------- Current treatment: Cycle 2 Chemo toxicities: 1.  Severe diarrhea: She did not take Imodium as prescribed because she got constipated and was very much afraid of taking Imodium.  Because of this she got dehydrated and required IV fluids 2. mucositis: We will decrease the dosage of Taxotere with the next cycle.  I had already reduced carboplatin dose to 700 mg. 3.  Bone pain: Tramadol helped and therefore sent a new prescription for tramadol.  Not much nausea. Return to clinic for cycle 3 chemo

## 2020-08-13 NOTE — Progress Notes (Signed)
Patient Care Team: Caryl Bis, MD as PCP - General (Family Medicine) Mauro Kaufmann, RN as Oncology Nurse Navigator Rockwell Germany, RN as Oncology Nurse Navigator Rolm Bookbinder, MD as Consulting Physician (General Surgery) Nicholas Lose, MD as Consulting Physician (Hematology and Oncology) Kyung Rudd, MD as Consulting Physician (Radiation Oncology)  DIAGNOSIS:    ICD-10-CM   1. Malignant neoplasm of upper-outer quadrant of right breast in female, estrogen receptor positive ([place])  C50.411    Z17.0     SUMMARY OF ONCOLOGIC HISTORY: Oncology History  Malignant neoplasm of upper-outer quadrant of right breast in female, estrogen receptor positive ([place])  07/08/2020 Initial Diagnosis   Screening mammogram showed right breast calcifications. Diagnostic mammogram showed right breast calcifications spanning 4.1cm and no abnormal right axillary lymph nodes. 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   07/24/2020 -  Chemotherapy    Patient is on Treatment Plan: BREAST  DOCETAXEL + CARBOPLATIN + TRASTUZUMAB + PERTUZUMAB  (TCHP) Q21D         CHIEF COMPLIANT: Cycle 2 Day 1 Adriamycin and Cytoxan   INTERVAL HISTORY: Sandy Goodwin is a 50 y.o. with above-mentioned history of left breast cancer currently on neoadjuvant chemotherapy with TCH Perjeta. She presents to the clinic today for cycle 2. diarrhea has subsided.  Mucositis has resolved.  Denies any nausea or vomiting.  Denies neuropathy.  She is still working as a Designer, jewellery and keeps busy.  ALLERGIES:  is allergic to codeine and hydrocodone.  MEDICATIONS:  Current Outpatient Medications  Medication Sig Dispense Refill  . BYSTOLIC 5 MG tablet Take 1 tablet by mouth daily.  1  . cetirizine (ZYRTEC) 10 MG tablet Take 10 mg by mouth daily.    . clobetasol ointment (TEMOVATE) 0.05 % Apply topically 2 (two) times daily.    Marland Kitchen dexamethasone  (DECADRON) 4 MG tablet Take 1 tablet (4 mg total) by mouth daily. Take 1 tablet day before chemo and 1 tablet day after chemo with food 12 tablet 0  . doxycycline (VIBRAMYCIN) 50 MG capsule Take 50 mg by mouth daily.    Marland Kitchen levothyroxine (SYNTHROID, LEVOTHROID) 50 MCG tablet Take 50 mcg by mouth daily before breakfast.    . lidocaine (XYLOCAINE) 2 % solution 5 ml swish and spit q 3 hours prn mouth pain 200 mL 2  . lidocaine-prilocaine (EMLA) cream Apply to affected area once 30 g 3  . LORazepam (ATIVAN) 0.5 MG tablet Take 1 tablet (0.$RemoveBef'5mg'wwFzIosnEG$  tablet) at bedtime, as needed for nausea. 30 tablet 0  . magic mouthwash (nystatin, diphenhydrAMINE, alum & mag hydroxide) suspension mixture Swish and spit 5 mLs 4 (four) times daily as needed for mouth pain. 240 mL 3  . meloxicam (MOBIC) 15 MG tablet Take 15 mg by mouth daily.    . metroNIDAZOLE (METROCREAM) 0.75 % cream Apply 1 application topically 2 (two) times daily.    . ondansetron (ZOFRAN) 8 MG tablet Take 1 tablet (8 mg total) by mouth 2 (two) times daily as needed (Nausea or vomiting). Start on the third day after chemotherapy. 30 tablet 1  . phenazopyridine (PYRIDIUM) 200 MG tablet Take 1 tablet (200 mg total) by mouth 3 (three) times daily as needed for pain. 21 tablet 1  . prochlorperazine (COMPAZINE) 10 MG tablet Take 1 tablet (10 mg total) by mouth every 6 (six) hours as needed (Nausea or vomiting). 30 tablet 1  . traMADol (ULTRAM) 50  MG tablet Take 1 tablet (50 mg total) by mouth every 6 (six) hours as needed. 30 tablet 0  . triamcinolone (NASACORT) 55 MCG/ACT AERO nasal inhaler Place 2 sprays into the nose daily.    . Turmeric 500 MG CAPS Take by mouth.    . valACYclovir (VALTREX) 1000 MG tablet Take 1 tablet (1,000 mg total) by mouth 2 (two) times daily. 14 tablet 2   No current facility-administered medications for this visit.    PHYSICAL EXAMINATION: ECOG PERFORMANCE STATUS: 1 - Symptomatic but completely ambulatory  Vitals:   08/14/20 0831   BP: 130/76  Pulse: 77  Resp: 20  Temp: (!) 96.8 F (36 C)  SpO2: 99%   Filed Weights   08/14/20 0831  Weight: 198 lb 8 oz (90 kg)    LABORATORY DATA:  I have reviewed the data as listed CMP Latest Ref Rng & Units 08/05/2020 07/31/2020 07/17/2020  Glucose 70 - 99 mg/dL 80 89 143(H)  BUN 6 - 20 mg/dL $Remove'15 13 10  'ubzQClY$ Creatinine 0.44 - 1.00 mg/dL 0.67 0.71 0.78  Sodium 135 - 145 mmol/L 139 140 139  Potassium 3.5 - 5.1 mmol/L 3.8 4.0 3.7  Chloride 98 - 111 mmol/L 105 106 102  CO2 22 - 32 mmol/L $RemoveB'23 24 26  'NNYIsXIU$ Calcium 8.9 - 10.3 mg/dL 8.7(L) 9.3 8.5(L)  Total Protein 6.5 - 8.1 g/dL 7.3 7.5 7.2  Total Bilirubin 0.3 - 1.2 mg/dL 0.4 0.6 0.4  Alkaline Phos 38 - 126 U/L 114 134(H) 81  AST 15 - 41 U/L 20 41 21  ALT 0 - 44 U/L 29 68(H) 37    Lab Results  Component Value Date   WBC 7.0 08/14/2020   HGB 11.5 (L) 08/14/2020   HCT 34.5 (L) 08/14/2020   MCV 90.6 08/14/2020   PLT 221 08/14/2020   NEUTROABS 3.7 08/14/2020    ASSESSMENT & PLAN:  Malignant neoplasm of upper-outer quadrant of right breast in female, estrogen receptor positive ([place]) 07/08/2020:Screening mammogram showed right breast calcifications. Diagnostic mammogram showed right breast calcifications spanning 4.1cm and no abnormal right axillary lymph nodes. 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 Genetics done in 2019: Negative  Treatment plan: 1. Neoadjuvant chemotherapy with TCH Perjeta 6 cycles followed by Herceptin Perjeta maintenance versus Kadcyla maintenance (based on response to neoadjuvant chemo) for 1 year 2. Followed by breast conserving surgery if possible with sentinel lymph node study 3. 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 [facility] nausea  study --------------------------------------------------------------------------------------------------------------------------------------- Current treatment: Cycle 2 TCHP Chemo toxicities: 1.  Severe diarrhea: Much improved.  She now understands importance of taking antidiarrheal medications. 2. mucositis: We decrease the dosage of Taxotere for cycle 2.  I had already reduced carboplatin dose to 700 mg.  She now has Magic mouthwash. 3.  Bone pain: On tramadol  Labs reviewed. Return to clinic for cycle 3 chemo    No orders of the defined types were placed in this encounter.  The patient has a good understanding of the overall plan. she agrees with it. she will call with any problems that may develop before the next visit here.  Total time spent: 30 mins including face to face time and time spent for planning, charting and coordination of care  Rulon Eisenmenger, MD, MPH 08/14/2020  I, Cloyde Reams Dorshimer, am acting as scribe for Dr. Loleta Dicker  [facility] [place].  I have reviewed the above documentation for accuracy and completeness, and I agree with the above.

## 2020-08-14 ENCOUNTER — Inpatient Hospital Stay: Payer: BC Managed Care – PPO | Attending: Hematology and Oncology

## 2020-08-14 ENCOUNTER — Encounter: Payer: Self-pay | Admitting: Hematology and Oncology

## 2020-08-14 ENCOUNTER — Inpatient Hospital Stay (HOSPITAL_BASED_OUTPATIENT_CLINIC_OR_DEPARTMENT_OTHER): Payer: BC Managed Care – PPO | Admitting: Hematology and Oncology

## 2020-08-14 ENCOUNTER — Inpatient Hospital Stay: Payer: BC Managed Care – PPO

## 2020-08-14 ENCOUNTER — Other Ambulatory Visit: Payer: BC Managed Care – PPO

## 2020-08-14 ENCOUNTER — Other Ambulatory Visit: Payer: Self-pay

## 2020-08-14 DIAGNOSIS — Z170 Estrogen receptor positive status [ER+]: Secondary | ICD-10-CM

## 2020-08-14 DIAGNOSIS — K1231 Oral mucositis (ulcerative) due to antineoplastic therapy: Secondary | ICD-10-CM | POA: Insufficient documentation

## 2020-08-14 DIAGNOSIS — R197 Diarrhea, unspecified: Secondary | ICD-10-CM | POA: Diagnosis not present

## 2020-08-14 DIAGNOSIS — Z5112 Encounter for antineoplastic immunotherapy: Secondary | ICD-10-CM | POA: Insufficient documentation

## 2020-08-14 DIAGNOSIS — Z5189 Encounter for other specified aftercare: Secondary | ICD-10-CM | POA: Insufficient documentation

## 2020-08-14 DIAGNOSIS — C50411 Malignant neoplasm of upper-outer quadrant of right female breast: Secondary | ICD-10-CM

## 2020-08-14 DIAGNOSIS — Z5111 Encounter for antineoplastic chemotherapy: Secondary | ICD-10-CM | POA: Diagnosis present

## 2020-08-14 DIAGNOSIS — Z95828 Presence of other vascular implants and grafts: Secondary | ICD-10-CM

## 2020-08-14 LAB — CBC WITH DIFFERENTIAL (CANCER CENTER ONLY)
Abs Immature Granulocytes: 0.03 10*3/uL (ref 0.00–0.07)
Basophils Absolute: 0 10*3/uL (ref 0.0–0.1)
Basophils Relative: 1 %
Eosinophils Absolute: 0 10*3/uL (ref 0.0–0.5)
Eosinophils Relative: 0 %
HCT: 34.5 % — ABNORMAL LOW (ref 36.0–46.0)
Hemoglobin: 11.5 g/dL — ABNORMAL LOW (ref 12.0–15.0)
Immature Granulocytes: 0 %
Lymphocytes Relative: 35 %
Lymphs Abs: 2.4 10*3/uL (ref 0.7–4.0)
MCH: 30.2 pg (ref 26.0–34.0)
MCHC: 33.3 g/dL (ref 30.0–36.0)
MCV: 90.6 fL (ref 80.0–100.0)
Monocytes Absolute: 0.8 10*3/uL (ref 0.1–1.0)
Monocytes Relative: 12 %
Neutro Abs: 3.7 10*3/uL (ref 1.7–7.7)
Neutrophils Relative %: 52 %
Platelet Count: 221 10*3/uL (ref 150–400)
RBC: 3.81 MIL/uL — ABNORMAL LOW (ref 3.87–5.11)
RDW: 14.2 % (ref 11.5–15.5)
WBC Count: 7 10*3/uL (ref 4.0–10.5)
nRBC: 0 % (ref 0.0–0.2)

## 2020-08-14 LAB — CMP (CANCER CENTER ONLY)
ALT: 26 U/L (ref 0–44)
AST: 17 U/L (ref 15–41)
Albumin: 3.9 g/dL (ref 3.5–5.0)
Alkaline Phosphatase: 90 U/L (ref 38–126)
Anion gap: 11 (ref 5–15)
BUN: 13 mg/dL (ref 6–20)
CO2: 25 mmol/L (ref 22–32)
Calcium: 8.8 mg/dL — ABNORMAL LOW (ref 8.9–10.3)
Chloride: 105 mmol/L (ref 98–111)
Creatinine: 0.71 mg/dL (ref 0.44–1.00)
GFR, Estimated: >60 mL/min (ref >60)
Glucose, Bld: 98 mg/dL (ref 70–99)
Potassium: 3.6 mmol/L (ref 3.5–5.1)
Sodium: 141 mmol/L (ref 135–145)
Total Bilirubin: 0.4 mg/dL (ref 0.3–1.2)
Total Protein: 7 g/dL (ref 6.5–8.1)

## 2020-08-14 MED ORDER — ACETAMINOPHEN 325 MG PO TABS
650.0000 mg | ORAL_TABLET | Freq: Once | ORAL | Status: AC
  Administered 2020-08-14: 650 mg via ORAL

## 2020-08-14 MED ORDER — SODIUM CHLORIDE 0.9% FLUSH
10.0000 mL | INTRAVENOUS | Status: DC | PRN
  Administered 2020-08-14: 10 mL
  Filled 2020-08-14: qty 10

## 2020-08-14 MED ORDER — ACETAMINOPHEN 325 MG PO TABS
ORAL_TABLET | ORAL | Status: AC
  Filled 2020-08-14: qty 2

## 2020-08-14 MED ORDER — SODIUM CHLORIDE 0.9 % IV SOLN
10.0000 mg | Freq: Once | INTRAVENOUS | Status: AC
  Administered 2020-08-14: 10 mg via INTRAVENOUS
  Filled 2020-08-14: qty 10

## 2020-08-14 MED ORDER — SODIUM CHLORIDE 0.9 % IV SOLN
Freq: Once | INTRAVENOUS | Status: AC
  Filled 2020-08-14: qty 250

## 2020-08-14 MED ORDER — DIPHENHYDRAMINE HCL 25 MG PO CAPS
ORAL_CAPSULE | ORAL | Status: AC
  Filled 2020-08-14: qty 2

## 2020-08-14 MED ORDER — HEPARIN SOD (PORK) LOCK FLUSH 100 UNIT/ML IV SOLN
500.0000 [IU] | Freq: Once | INTRAVENOUS | Status: AC | PRN
  Administered 2020-08-14: 500 [IU]
  Filled 2020-08-14: qty 5

## 2020-08-14 MED ORDER — SODIUM CHLORIDE 0.9 % IV SOLN
65.0000 mg/m2 | Freq: Once | INTRAVENOUS | Status: AC
  Administered 2020-08-14: 130 mg via INTRAVENOUS
  Filled 2020-08-14: qty 13

## 2020-08-14 MED ORDER — PALONOSETRON HCL INJECTION 0.25 MG/5ML
0.2500 mg | Freq: Once | INTRAVENOUS | Status: AC
  Administered 2020-08-14: 0.25 mg via INTRAVENOUS

## 2020-08-14 MED ORDER — DIPHENHYDRAMINE HCL 25 MG PO CAPS
50.0000 mg | ORAL_CAPSULE | Freq: Once | ORAL | Status: AC
  Administered 2020-08-14: 50 mg via ORAL

## 2020-08-14 MED ORDER — PALONOSETRON HCL INJECTION 0.25 MG/5ML
INTRAVENOUS | Status: AC
  Filled 2020-08-14: qty 5

## 2020-08-14 MED ORDER — TRASTUZUMAB-DKST CHEMO 150 MG IV SOLR
6.0000 mg/kg | Freq: Once | INTRAVENOUS | Status: AC
  Administered 2020-08-14: 546 mg via INTRAVENOUS
  Filled 2020-08-14: qty 26

## 2020-08-14 MED ORDER — SODIUM CHLORIDE 0.9 % IV SOLN
700.0000 mg | Freq: Once | INTRAVENOUS | Status: AC
  Administered 2020-08-14: 700 mg via INTRAVENOUS
  Filled 2020-08-14: qty 70

## 2020-08-14 MED ORDER — FOSAPREPITANT DIMEGLUMINE INJECTION 150 MG
150.0000 mg | Freq: Once | INTRAVENOUS | Status: AC
  Administered 2020-08-14: 150 mg via INTRAVENOUS
  Filled 2020-08-14: qty 150

## 2020-08-14 MED ORDER — SODIUM CHLORIDE 0.9 % IV SOLN
420.0000 mg | Freq: Once | INTRAVENOUS | Status: AC
  Administered 2020-08-14: 420 mg via INTRAVENOUS
  Filled 2020-08-14: qty 14

## 2020-08-14 MED ORDER — SODIUM CHLORIDE 0.9% FLUSH
10.0000 mL | INTRAVENOUS | Status: DC | PRN
  Administered 2020-08-14: 10 mL via INTRAVENOUS
  Filled 2020-08-14: qty 10

## 2020-08-14 NOTE — Progress Notes (Signed)
Patient brought income for [employer]. Patient household income is over the income guidelines. She verbalized understanding.  Patient brought back [facility] application. Emailed to [employer] w/ [employer]. She will reach out to patient directly if they are able to assist.  Patient has my card for any additional financial questions or concerns.

## 2020-08-14 NOTE — Patient Instructions (Signed)
[facility] ONCOLOGY  Discharge Instructions: Thank you for choosing [facility] to provide your oncology and hematology care.   If you have a lab appointment with the [facility], please go directly to the [facility] and check in at the registration area.   Wear comfortable clothing and clothing appropriate for easy access to any Portacath or PICC line.   We strive to give you quality time with your provider. You may need to reschedule your appointment if you arrive late (15 or more minutes).  Arriving late affects you and other patients whose appointments are after yours.  Also, if you miss three or more appointments without notifying the office, you may be dismissed from the clinic at the provider's discretion.      For prescription refill requests, have your pharmacy contact our office and allow 72 hours for refills to be completed.    Today you received the following chemotherapy and/or immunotherapy agents: Trastuzumab, Pertuzumab, Taxotere, Carboplatin     To help prevent nausea and vomiting after your treatment, we encourage you to take your nausea medication as directed.  BELOW ARE SYMPTOMS THAT SHOULD BE REPORTED IMMEDIATELY: . *FEVER GREATER THAN 100.4 F (38 C) OR HIGHER . *CHILLS OR SWEATING . *NAUSEA AND VOMITING THAT IS NOT CONTROLLED WITH YOUR NAUSEA MEDICATION . *UNUSUAL SHORTNESS OF BREATH . *UNUSUAL BRUISING OR BLEEDING . *URINARY PROBLEMS (pain or burning when urinating, or frequent urination) . *BOWEL PROBLEMS (unusual diarrhea, constipation, pain near the anus) . TENDERNESS IN MOUTH AND THROAT WITH OR WITHOUT PRESENCE OF ULCERS (sore throat, sores in mouth, or a toothache) . UNUSUAL RASH, SWELLING OR PAIN  . UNUSUAL VAGINAL DISCHARGE OR ITCHING   Items with * indicate a potential emergency and should be followed up as soon as possible or go to the Emergency Department if any problems should occur.  Please show the  CHEMOTHERAPY ALERT CARD or IMMUNOTHERAPY ALERT CARD at check-in to the Emergency Department and triage nurse.  Should you have questions after your visit or need to cancel or reschedule your appointment, please contact [facility]  Dept: [phone]  and follow the prompts.  Office hours are 8:00 a.m. to 4:30 p.m. Monday - Friday. Please note that voicemails left after 4:00 p.m. may not be returned until the following business day.  We are closed weekends and major holidays. You have access to a nurse at all times for urgent questions. Please call the main number to the clinic Dept: [phone] and follow the prompts.   For any non-urgent questions, you may also contact your provider using MyChart. We now offer e-Visits for anyone 48 and older to request care online for non-urgent symptoms. For details visit [email or web address].   Also download the MyChart app! Go to the app store, search "MyChart", open the app, select [facility], and log in with your MyChart username and password.  Due to Covid, a mask is required upon entering the hospital/clinic. If you do not have a mask, one will be given to you upon arrival. For doctor visits, patients may have 1 support person aged 47 or older with them. For treatment visits, patients cannot have anyone with them due to current Covid guidelines and our immunocompromised population.

## 2020-08-14 NOTE — Patient Instructions (Signed)
Implanted Port Insertion, Care After This sheet gives you information about how to care for yourself after your procedure. Your health care provider may also give you more specific instructions. If you have problems or questions, contact your health care provider. What can I expect after the procedure? After the procedure, it is common to have:  Discomfort at the port insertion site.  Bruising on the skin over the port. This should improve over 3-4 days. Follow these instructions at home: Port care  After your port is placed, you will get a manufacturer's information card. The card has information about your port. Keep this card with you at all times.  Take care of the port as told by your health care provider. Ask your health care provider if you or a family member can get training for taking care of the port at home. A home health care nurse may also take care of the port.  Make sure to remember what type of port you have. Incision care  Follow instructions from your health care provider about how to take care of your port insertion site. Make sure you: ? Wash your hands with soap and water before and after you change your bandage (dressing). If soap and water are not available, use hand sanitizer. ? Change your dressing as told by your health care provider. ? Leave stitches (sutures), skin glue, or adhesive strips in place. These skin closures may need to stay in place for 2 weeks or longer. If adhesive strip edges start to loosen and curl up, you may trim the loose edges. Do not remove adhesive strips completely unless your health care provider tells you to do that.  Check your port insertion site every day for signs of infection. Check for: ? Redness, swelling, or pain. ? Fluid or blood. ? Warmth. ? Pus or a bad smell.      Activity  Return to your normal activities as told by your health care provider. Ask your health care provider what activities are safe for you.  Do not  lift anything that is heavier than 10 lb (4.5 kg), or the limit that you are told, until your health care provider says that it is safe. General instructions  Take over-the-counter and prescription medicines only as told by your health care provider.  Do not take baths, swim, or use a hot tub until your health care provider approves. Ask your health care provider if you may take showers. You may only be allowed to take sponge baths.  Do not drive for 24 hours if you were given a sedative during your procedure.  Wear a medical alert bracelet in case of an emergency. This will tell any health care providers that you have a port.  Keep all follow-up visits as told by your health care provider. This is important. Contact a health care provider if:  You cannot flush your port with saline as directed, or you cannot draw blood from the port.  You have a fever or chills.  You have redness, swelling, or pain around your port insertion site.  You have fluid or blood coming from your port insertion site.  Your port insertion site feels warm to the touch.  You have pus or a bad smell coming from the port insertion site. Get help right away if:  You have chest pain or shortness of breath.  You have bleeding from your port that you cannot control. Summary  Take care of the port as told by your   health care provider. Keep the manufacturer's information card with you at all times.  Change your dressing as told by your health care provider.  Contact a health care provider if you have a fever or chills or if you have redness, swelling, or pain around your port insertion site.  Keep all follow-up visits as told by your health care provider. This information is not intended to replace advice given to you by your health care provider. Make sure you discuss any questions you have with your health care provider. Document Revised: 10/26/2017 Document Reviewed: 10/26/2017 Elsevier Patient Education   2021 Elsevier Inc.  

## 2020-08-16 ENCOUNTER — Other Ambulatory Visit: Payer: Self-pay

## 2020-08-16 ENCOUNTER — Inpatient Hospital Stay: Payer: BC Managed Care – PPO

## 2020-08-16 VITALS — BP 101/62 | HR 67 | Resp 18

## 2020-08-16 DIAGNOSIS — Z170 Estrogen receptor positive status [ER+]: Secondary | ICD-10-CM

## 2020-08-16 DIAGNOSIS — Z5112 Encounter for antineoplastic immunotherapy: Secondary | ICD-10-CM | POA: Diagnosis not present

## 2020-08-16 MED ORDER — PEGFILGRASTIM-CBQV 6 MG/0.6ML ~~LOC~~ SOSY
PREFILLED_SYRINGE | SUBCUTANEOUS | Status: AC
  Filled 2020-08-16: qty 0.6

## 2020-08-16 MED ORDER — PEGFILGRASTIM-CBQV 6 MG/0.6ML ~~LOC~~ SOSY
6.0000 mg | PREFILLED_SYRINGE | Freq: Once | SUBCUTANEOUS | Status: AC
  Administered 2020-08-16: 6 mg via SUBCUTANEOUS

## 2020-08-16 NOTE — Patient Instructions (Signed)
Pegfilgrastim injection What is this medicine? PEGFILGRASTIM (PEG fil gra stim) is a long-acting granulocyte colony-stimulating factor that stimulates the growth of neutrophils, a type of white blood cell important in the body's fight against infection. It is used to reduce the incidence of fever and infection in patients with certain types of cancer who are receiving chemotherapy that affects the bone marrow, and to increase survival after being exposed to high doses of radiation. This medicine may be used for other purposes; ask your health care provider or pharmacist if you have questions. COMMON BRAND NAME(S): Fulphila, Neulasta, Nyvepria, UDENYCA, Ziextenzo What should I tell my health care provider before I take this medicine? They need to know if you have any of these conditions:  kidney disease  latex allergy  ongoing radiation therapy  sickle cell disease  skin reactions to acrylic adhesives (On-Body Injector only)  an unusual or allergic reaction to pegfilgrastim, filgrastim, other medicines, foods, dyes, or preservatives  pregnant or trying to get pregnant  breast-feeding How should I use this medicine? This medicine is for injection under the skin. If you get this medicine at home, you will be taught how to prepare and give the pre-filled syringe or how to use the On-body Injector. Refer to the patient Instructions for Use for detailed instructions. Use exactly as directed. Tell your healthcare provider immediately if you suspect that the On-body Injector may not have performed as intended or if you suspect the use of the On-body Injector resulted in a missed or partial dose. It is important that you put your used needles and syringes in a special sharps container. Do not put them in a trash can. If you do not have a sharps container, call your pharmacist or healthcare provider to get one. Talk to your pediatrician regarding the use of this medicine in children. While this drug  may be prescribed for selected conditions, precautions do apply. Overdosage: If you think you have taken too much of this medicine contact a poison control center or emergency room at once. NOTE: This medicine is only for you. Do not share this medicine with others. What if I miss a dose? It is important not to miss your dose. Call your doctor or health care professional if you miss your dose. If you miss a dose due to an On-body Injector failure or leakage, a new dose should be administered as soon as possible using a single prefilled syringe for manual use. What may interact with this medicine? Interactions have not been studied. This list may not describe all possible interactions. Give your health care provider a list of all the medicines, herbs, non-prescription drugs, or dietary supplements you use. Also tell them if you smoke, drink alcohol, or use illegal drugs. Some items may interact with your medicine. What should I watch for while using this medicine? Your condition will be monitored carefully while you are receiving this medicine. You may need blood work done while you are taking this medicine. Talk to your health care provider about your risk of cancer. You may be more at risk for certain types of cancer if you take this medicine. If you are going to need a MRI, CT scan, or other procedure, tell your doctor that you are using this medicine (On-Body Injector only). What side effects may I notice from receiving this medicine? Side effects that you should report to your doctor or health care professional as soon as possible:  allergic reactions (skin rash, itching or hives, swelling of   the face, lips, or tongue)  back pain  dizziness  fever  pain, redness, or irritation at site where injected  pinpoint red spots on the skin  red or dark-brown urine  shortness of breath or breathing problems  stomach or side pain, or pain at the shoulder  swelling  tiredness  trouble  passing urine or change in the amount of urine  unusual bruising or bleeding Side effects that usually do not require medical attention (report to your doctor or health care professional if they continue or are bothersome):  bone pain  muscle pain This list may not describe all possible side effects. Call your doctor for medical advice about side effects. You may report side effects to FDA at 1-800-FDA-1088. Where should I keep my medicine? Keep out of the reach of children. If you are using this medicine at home, you will be instructed on how to store it. Throw away any unused medicine after the expiration date on the label. NOTE: This sheet is a summary. It may not cover all possible information. If you have questions about this medicine, talk to your doctor, pharmacist, or health care provider.  2021 Elsevier/Gold Standard (2019-04-21 13:20:51)  

## 2020-08-21 ENCOUNTER — Other Ambulatory Visit: Payer: Self-pay | Admitting: Hematology and Oncology

## 2020-08-21 ENCOUNTER — Inpatient Hospital Stay: Payer: BC Managed Care – PPO

## 2020-08-21 ENCOUNTER — Other Ambulatory Visit: Payer: Self-pay

## 2020-08-21 VITALS — BP 120/82 | HR 63 | Temp 98.0°F | Resp 18

## 2020-08-21 DIAGNOSIS — Z5112 Encounter for antineoplastic immunotherapy: Secondary | ICD-10-CM | POA: Diagnosis not present

## 2020-08-21 DIAGNOSIS — Z170 Estrogen receptor positive status [ER+]: Secondary | ICD-10-CM

## 2020-08-21 DIAGNOSIS — C50411 Malignant neoplasm of upper-outer quadrant of right female breast: Secondary | ICD-10-CM

## 2020-08-21 MED ORDER — SODIUM CHLORIDE 0.9 % IV SOLN
Freq: Once | INTRAVENOUS | Status: DC
  Filled 2020-08-21: qty 250

## 2020-08-21 MED ORDER — SODIUM CHLORIDE 0.9 % IV SOLN
Freq: Once | INTRAVENOUS | Status: AC
  Filled 2020-08-21: qty 1000

## 2020-09-04 ENCOUNTER — Ambulatory Visit: Payer: BC Managed Care – PPO

## 2020-09-04 ENCOUNTER — Ambulatory Visit: Payer: BC Managed Care – PPO | Admitting: Hematology and Oncology

## 2020-09-04 ENCOUNTER — Other Ambulatory Visit: Payer: BC Managed Care – PPO

## 2020-09-06 ENCOUNTER — Ambulatory Visit: Payer: BC Managed Care – PPO

## 2020-09-10 NOTE — Progress Notes (Signed)
Patient Care Team: Caryl Bis, MD as PCP - General (Family Medicine) Mauro Kaufmann, RN as Oncology Nurse Navigator Rockwell Germany, RN as Oncology Nurse Navigator Rolm Bookbinder, MD as Consulting Physician (General Surgery) Nicholas Lose, MD as Consulting Physician (Hematology and Oncology) Kyung Rudd, MD as Consulting Physician (Radiation Oncology)  DIAGNOSIS:    ICD-10-CM   1. Malignant neoplasm of upper-outer quadrant of right breast in female, estrogen receptor positive ([place])  C50.411    Z17.0     SUMMARY OF ONCOLOGIC HISTORY: Oncology History  Malignant neoplasm of upper-outer quadrant of right breast in female, estrogen receptor positive ([place])  07/08/2020 Initial Diagnosis   Screening mammogram showed right breast calcifications. Diagnostic mammogram showed right breast calcifications spanning 4.1cm and no abnormal right axillary lymph nodes. 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   07/24/2020 -  Chemotherapy    Patient is on Treatment Plan: BREAST  DOCETAXEL + CARBOPLATIN + TRASTUZUMAB + PERTUZUMAB  (TCHP) Q21D         CHIEF COMPLIANT: Cycle 3 Day 1 TCH Perjeta  INTERVAL HISTORY: Sandy Goodwin is a 50 y.o. with above-mentioned history of breast cancer currently on neoadjuvant chemotherapy with TCH Perjeta. She presents to the clinic todayfor cycle 3.  She had diarrhea for several days and she took half a tablet of Imodium and slowed it down.  She does not want to take much Imodium because it caused constipation for her.  Did not have any nausea or vomiting.  She had mild occasional numbness of her toes but it has gone away.  Her biggest issue is a rash that comes on after chemo.  It last for 3 to 4 days it is maculopapular and erythematous especially on the face and into the neck.  She is taking doxycycline for the rash which appears to be helping her.  ALLERGIES:  is allergic to  codeine and hydrocodone.  MEDICATIONS:  Current Outpatient Medications  Medication Sig Dispense Refill  . BYSTOLIC 5 MG tablet Take 1 tablet by mouth daily.  1  . cetirizine (ZYRTEC) 10 MG tablet Take 10 mg by mouth daily.    . clobetasol ointment (TEMOVATE) 0.05 % Apply topically 2 (two) times daily.    Marland Kitchen dexamethasone (DECADRON) 4 MG tablet Take 1 tablet (4 mg total) by mouth daily. Take 1 tablet day before chemo and 1 tablet day after chemo with food 12 tablet 0  . doxycycline (VIBRAMYCIN) 50 MG capsule Take 50 mg by mouth daily.    Marland Kitchen levothyroxine (SYNTHROID, LEVOTHROID) 50 MCG tablet Take 50 mcg by mouth daily before breakfast.    . lidocaine (XYLOCAINE) 2 % solution 5 ml swish and spit q 3 hours prn mouth pain 200 mL 2  . lidocaine-prilocaine (EMLA) cream Apply to affected area once 30 g 3  . LORazepam (ATIVAN) 0.5 MG tablet Take 1 tablet (0.$RemoveBef'5mg'okOHwDplMg$  tablet) at bedtime, as needed for nausea. 30 tablet 0  . magic mouthwash (nystatin, diphenhydrAMINE, alum & mag hydroxide) suspension mixture Swish and spit 5 mLs 4 (four) times daily as needed for mouth pain. 240 mL 3  . meloxicam (MOBIC) 15 MG tablet Take 15 mg by mouth daily.    . metroNIDAZOLE (METROCREAM) 0.75 % cream Apply 1 application topically 2 (two) times daily.    . ondansetron (ZOFRAN) 8 MG tablet Take 1 tablet (8 mg total) by mouth 2 (two) times daily as needed (  Nausea or vomiting). Start on the third day after chemotherapy. 30 tablet 1  . phenazopyridine (PYRIDIUM) 200 MG tablet Take 1 tablet (200 mg total) by mouth 3 (three) times daily as needed for pain. 21 tablet 1  . prochlorperazine (COMPAZINE) 10 MG tablet Take 1 tablet (10 mg total) by mouth every 6 (six) hours as needed (Nausea or vomiting). 30 tablet 1  . traMADol (ULTRAM) 50 MG tablet Take 1 tablet (50 mg total) by mouth every 6 (six) hours as needed. 30 tablet 0  . triamcinolone (NASACORT) 55 MCG/ACT AERO nasal inhaler Place 2 sprays into the nose daily.    . Turmeric  500 MG CAPS Take by mouth.    . valACYclovir (VALTREX) 1000 MG tablet Take 1 tablet (1,000 mg total) by mouth 2 (two) times daily. 14 tablet 2   No current facility-administered medications for this visit.    PHYSICAL EXAMINATION: ECOG PERFORMANCE STATUS: 1 - Symptomatic but completely ambulatory  Vitals:   09/11/20 0952  BP: 127/71  Pulse: 62  Resp: 18  Temp: 97.9 F (36.6 C)  SpO2: 98%   Filed Weights   09/11/20 0952  Weight: 203 lb 8 oz (92.3 kg)    LABORATORY DATA:  I have reviewed the data as listed CMP Latest Ref Rng & Units 09/11/2020 08/14/2020 08/05/2020  Glucose 70 - 99 mg/dL 91 98 80  BUN 6 - 20 mg/dL _0 Creatinine 0.44 - 1.00 mg/dL 0.70 0.71 0.67  Sodium 135 - 145 mmol/L 140 141 139  Potassium 3.5 - 5.1 mmol/L 3.6 3.6 3.8  Chloride 98 - 111 mmol/L 105 105 105  CO2 22 - 32 mmol/L _1 Calcium 8.9 - 10.3 mg/dL 9.1 8.8(L) 8.7(L)  Total Protein 6.5 - 8.1 g/dL 7.0 7.0 7.3  Total Bilirubin 0.3 - 1.2 mg/dL 0.3 0.4 0.4  Alkaline Phos 38 - 126 U/L 94 90 114  AST 15 - 41 U/L _2 ALT 0 - 44 U/L _3 Lab Results  Component Value Date   WBC 6.8 09/11/2020   HGB 11.2 (L) 09/11/2020   HCT 33.0 (L) 09/11/2020   MCV 90.7 09/11/2020   PLT 288 09/11/2020   NEUTROABS 3.4 09/11/2020    ASSESSMENT & PLAN:  Malignant neoplasm of upper-outer quadrant of right breast in female, estrogen receptor positive ([place]) 07/08/2020:Screening mammogram showed right breast calcifications. Diagnostic mammogram showed right breast calcifications spanning 4.1cm and no abnormal right axillary lymph nodes. 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 Genetics done in 2019: Negative  Treatment plan: 1. Neoadjuvant chemotherapy with TCH Perjeta 6 cycles followed by Herceptin Perjeta maintenance versus Kadcyla maintenance (based on response to neoadjuvant chemo) for 1 year 2. Followed by breast  conserving surgery if possible with sentinel lymph node study 3. 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 URCCnausea study --------------------------------------------------------------------------------------------------------------------------------------- Current treatment: Cycle 3 TCHP Chemo toxicities: 1.Severe diarrhea: Much improved.    She takes Imodium as needed.  We are giving IV fluids a week after chemo. 2.mucositis: We decreased the dosage of Taxotere for cycle 2. I had already reduced carboplatin dose to 700 mg.  She now has Magic mouthwash. 3.Bone pain: On tramadol 4.  Skin rash: Maculopapular after each chemo.  I instructed her to use the doxycycline that she has as  well as to put some topical cortisone cream as well.4  Labs reviewed. Return to clinic for cycle 4 chemo     No orders of the defined types were placed in this encounter.  The patient has a good understanding of the overall plan. she agrees with it. she will call with any problems that may develop before the next visit here.  Total time spent: 30 mins including face to face time and time spent for planning, charting and coordination of care  Rulon Eisenmenger, MD, MPH 09/11/2020  I, Cloyde Reams Dorshimer, am acting as scribe for Dr. Nicholas Lose.  I have reviewed the above documentation for accuracy and completeness, and I agree with the above.

## 2020-09-10 NOTE — Assessment & Plan Note (Signed)
07/08/2020:Screening mammogram showed right breast calcifications. Diagnostic mammogram showed right breast calcifications spanning 4.1cm and no abnormal right axillary lymph nodes. 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 Genetics done in 2019: Negative  Treatment plan: 1. Neoadjuvant chemotherapy with TCH Perjeta 6 cycles followed by Herceptin Perjeta maintenance versus Kadcyla maintenance (based on response to neoadjuvant chemo) for 1 year 2. Followed by breast conserving surgery if possible with sentinel lymph node study 3. 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 URCCnausea study --------------------------------------------------------------------------------------------------------------------------------------- Current treatment: Cycle 3 TCHP Chemo toxicities: 1.Severe diarrhea: Much improved.  She now understands importance of taking antidiarrheal medications. 2.mucositis: We decrease the dosage of Taxotere for cycle 2. I had already reduced carboplatin dose to 700 mg.  She now has Magic mouthwash. 3.Bone pain: On tramadol  Labs reviewed. Return to clinic for cycle 4 chemo

## 2020-09-11 ENCOUNTER — Other Ambulatory Visit: Payer: BC Managed Care – PPO

## 2020-09-11 ENCOUNTER — Inpatient Hospital Stay: Payer: BC Managed Care – PPO | Attending: Hematology and Oncology

## 2020-09-11 ENCOUNTER — Other Ambulatory Visit: Payer: Self-pay

## 2020-09-11 ENCOUNTER — Inpatient Hospital Stay (HOSPITAL_BASED_OUTPATIENT_CLINIC_OR_DEPARTMENT_OTHER): Payer: BC Managed Care – PPO | Admitting: Hematology and Oncology

## 2020-09-11 ENCOUNTER — Inpatient Hospital Stay: Payer: BC Managed Care – PPO

## 2020-09-11 VITALS — BP 114/70 | HR 68 | Temp 98.4°F | Resp 18

## 2020-09-11 DIAGNOSIS — Z5189 Encounter for other specified aftercare: Secondary | ICD-10-CM | POA: Insufficient documentation

## 2020-09-11 DIAGNOSIS — C50411 Malignant neoplasm of upper-outer quadrant of right female breast: Secondary | ICD-10-CM

## 2020-09-11 DIAGNOSIS — Z95828 Presence of other vascular implants and grafts: Secondary | ICD-10-CM

## 2020-09-11 DIAGNOSIS — Z5112 Encounter for antineoplastic immunotherapy: Secondary | ICD-10-CM | POA: Insufficient documentation

## 2020-09-11 DIAGNOSIS — Z170 Estrogen receptor positive status [ER+]: Secondary | ICD-10-CM

## 2020-09-11 DIAGNOSIS — R197 Diarrhea, unspecified: Secondary | ICD-10-CM | POA: Diagnosis not present

## 2020-09-11 DIAGNOSIS — Z5111 Encounter for antineoplastic chemotherapy: Secondary | ICD-10-CM | POA: Insufficient documentation

## 2020-09-11 DIAGNOSIS — K1231 Oral mucositis (ulcerative) due to antineoplastic therapy: Secondary | ICD-10-CM | POA: Diagnosis not present

## 2020-09-11 LAB — CBC WITH DIFFERENTIAL (CANCER CENTER ONLY)
Abs Immature Granulocytes: 0.03 10*3/uL (ref 0.00–0.07)
Basophils Absolute: 0 10*3/uL (ref 0.0–0.1)
Basophils Relative: 0 %
Eosinophils Absolute: 0.1 10*3/uL (ref 0.0–0.5)
Eosinophils Relative: 1 %
HCT: 33 % — ABNORMAL LOW (ref 36.0–46.0)
Hemoglobin: 11.2 g/dL — ABNORMAL LOW (ref 12.0–15.0)
Immature Granulocytes: 0 %
Lymphocytes Relative: 38 %
Lymphs Abs: 2.6 10*3/uL (ref 0.7–4.0)
MCH: 30.8 pg (ref 26.0–34.0)
MCHC: 33.9 g/dL (ref 30.0–36.0)
MCV: 90.7 fL (ref 80.0–100.0)
Monocytes Absolute: 0.7 10*3/uL (ref 0.1–1.0)
Monocytes Relative: 10 %
Neutro Abs: 3.4 10*3/uL (ref 1.7–7.7)
Neutrophils Relative %: 51 %
Platelet Count: 288 10*3/uL (ref 150–400)
RBC: 3.64 MIL/uL — ABNORMAL LOW (ref 3.87–5.11)
RDW: 16 % — ABNORMAL HIGH (ref 11.5–15.5)
WBC Count: 6.8 10*3/uL (ref 4.0–10.5)
nRBC: 0 % (ref 0.0–0.2)

## 2020-09-11 LAB — CMP (CANCER CENTER ONLY)
ALT: 23 U/L (ref 0–44)
AST: 17 U/L (ref 15–41)
Albumin: 3.7 g/dL (ref 3.5–5.0)
Alkaline Phosphatase: 94 U/L (ref 38–126)
Anion gap: 10 (ref 5–15)
BUN: 18 mg/dL (ref 6–20)
CO2: 25 mmol/L (ref 22–32)
Calcium: 9.1 mg/dL (ref 8.9–10.3)
Chloride: 105 mmol/L (ref 98–111)
Creatinine: 0.7 mg/dL (ref 0.44–1.00)
GFR, Estimated: >60 mL/min (ref >60)
Glucose, Bld: 91 mg/dL (ref 70–99)
Potassium: 3.6 mmol/L (ref 3.5–5.1)
Sodium: 140 mmol/L (ref 135–145)
Total Bilirubin: 0.3 mg/dL (ref 0.3–1.2)
Total Protein: 7 g/dL (ref 6.5–8.1)

## 2020-09-11 MED ORDER — SODIUM CHLORIDE 0.9 % IV SOLN
10.0000 mg | Freq: Once | INTRAVENOUS | Status: AC
  Administered 2020-09-11: 10 mg via INTRAVENOUS
  Filled 2020-09-11: qty 10

## 2020-09-11 MED ORDER — SODIUM CHLORIDE 0.9 % IV SOLN
700.0000 mg | Freq: Once | INTRAVENOUS | Status: AC
  Administered 2020-09-11: 700 mg via INTRAVENOUS
  Filled 2020-09-11: qty 70

## 2020-09-11 MED ORDER — ACETAMINOPHEN 325 MG PO TABS
ORAL_TABLET | ORAL | Status: AC
  Filled 2020-09-11: qty 2

## 2020-09-11 MED ORDER — TRASTUZUMAB-DKST CHEMO 150 MG IV SOLR
6.0000 mg/kg | Freq: Once | INTRAVENOUS | Status: AC
  Administered 2020-09-11: 546 mg via INTRAVENOUS
  Filled 2020-09-11: qty 26

## 2020-09-11 MED ORDER — PALONOSETRON HCL INJECTION 0.25 MG/5ML
0.2500 mg | Freq: Once | INTRAVENOUS | Status: AC
  Administered 2020-09-11: 0.25 mg via INTRAVENOUS

## 2020-09-11 MED ORDER — SODIUM CHLORIDE 0.9 % IV SOLN
420.0000 mg | Freq: Once | INTRAVENOUS | Status: AC
  Administered 2020-09-11: 420 mg via INTRAVENOUS
  Filled 2020-09-11: qty 14

## 2020-09-11 MED ORDER — DIPHENHYDRAMINE HCL 25 MG PO CAPS
50.0000 mg | ORAL_CAPSULE | Freq: Once | ORAL | Status: AC
  Administered 2020-09-11: 50 mg via ORAL

## 2020-09-11 MED ORDER — HEPARIN SOD (PORK) LOCK FLUSH 100 UNIT/ML IV SOLN
500.0000 [IU] | Freq: Once | INTRAVENOUS | Status: AC | PRN
  Administered 2020-09-11: 500 [IU]
  Filled 2020-09-11: qty 5

## 2020-09-11 MED ORDER — ACETAMINOPHEN 325 MG PO TABS
650.0000 mg | ORAL_TABLET | Freq: Once | ORAL | Status: AC
  Administered 2020-09-11: 650 mg via ORAL

## 2020-09-11 MED ORDER — SODIUM CHLORIDE 0.9 % IV SOLN
65.0000 mg/m2 | Freq: Once | INTRAVENOUS | Status: AC
  Administered 2020-09-11: 130 mg via INTRAVENOUS
  Filled 2020-09-11: qty 13

## 2020-09-11 MED ORDER — SODIUM CHLORIDE 0.9% FLUSH
10.0000 mL | INTRAVENOUS | Status: DC | PRN
  Administered 2020-09-11: 10 mL via INTRAVENOUS
  Filled 2020-09-11: qty 10

## 2020-09-11 MED ORDER — DIPHENHYDRAMINE HCL 25 MG PO CAPS
ORAL_CAPSULE | ORAL | Status: AC
  Filled 2020-09-11: qty 2

## 2020-09-11 MED ORDER — PALONOSETRON HCL INJECTION 0.25 MG/5ML
INTRAVENOUS | Status: AC
  Filled 2020-09-11: qty 5

## 2020-09-11 MED ORDER — SODIUM CHLORIDE 0.9% FLUSH
10.0000 mL | INTRAVENOUS | Status: DC | PRN
  Administered 2020-09-11: 10 mL
  Filled 2020-09-11: qty 10

## 2020-09-11 MED ORDER — FOSAPREPITANT DIMEGLUMINE INJECTION 150 MG
150.0000 mg | Freq: Once | INTRAVENOUS | Status: AC
  Administered 2020-09-11: 150 mg via INTRAVENOUS
  Filled 2020-09-11: qty 150

## 2020-09-11 MED ORDER — SODIUM CHLORIDE 0.9 % IV SOLN
Freq: Once | INTRAVENOUS | Status: AC
  Filled 2020-09-11: qty 250

## 2020-09-11 NOTE — Patient Instructions (Signed)
[facility] ONCOLOGY  Discharge Instructions: Thank you for choosing [facility] to provide your oncology and hematology care.   If you have a lab appointment with the [facility], please go directly to the [facility] and check in at the registration area.   Wear comfortable clothing and clothing appropriate for easy access to any Portacath or PICC line.   We strive to give you quality time with your provider. You may need to reschedule your appointment if you arrive late (15 or more minutes).  Arriving late affects you and other patients whose appointments are after yours.  Also, if you miss three or more appointments without notifying the office, you may be dismissed from the clinic at the provider's discretion.      For prescription refill requests, have your pharmacy contact our office and allow 72 hours for refills to be completed.    Today you received the following chemotherapy and/or immunotherapy agents: Trastuzumab, Pertuzumab, Taxotere, Carboplatin     To help prevent nausea and vomiting after your treatment, we encourage you to take your nausea medication as directed.  BELOW ARE SYMPTOMS THAT SHOULD BE REPORTED IMMEDIATELY: . *FEVER GREATER THAN 100.4 F (38 C) OR HIGHER . *CHILLS OR SWEATING . *NAUSEA AND VOMITING THAT IS NOT CONTROLLED WITH YOUR NAUSEA MEDICATION . *UNUSUAL SHORTNESS OF BREATH . *UNUSUAL BRUISING OR BLEEDING . *URINARY PROBLEMS (pain or burning when urinating, or frequent urination) . *BOWEL PROBLEMS (unusual diarrhea, constipation, pain near the anus) . TENDERNESS IN MOUTH AND THROAT WITH OR WITHOUT PRESENCE OF ULCERS (sore throat, sores in mouth, or a toothache) . UNUSUAL RASH, SWELLING OR PAIN  . UNUSUAL VAGINAL DISCHARGE OR ITCHING   Items with * indicate a potential emergency and should be followed up as soon as possible or go to the Emergency Department if any problems should occur.  Please show the  CHEMOTHERAPY ALERT CARD or IMMUNOTHERAPY ALERT CARD at check-in to the Emergency Department and triage nurse.  Should you have questions after your visit or need to cancel or reschedule your appointment, please contact [facility]  Dept: [phone]  and follow the prompts.  Office hours are 8:00 a.m. to 4:30 p.m. Monday - Friday. Please note that voicemails left after 4:00 p.m. may not be returned until the following business day.  We are closed weekends and major holidays. You have access to a nurse at all times for urgent questions. Please call the main number to the clinic Dept: [phone] and follow the prompts.   For any non-urgent questions, you may also contact your provider using MyChart. We now offer e-Visits for anyone 48 and older to request care online for non-urgent symptoms. For details visit [email or web address].   Also download the MyChart app! Go to the app store, search "MyChart", open the app, select [facility], and log in with your MyChart username and password.  Due to Covid, a mask is required upon entering the hospital/clinic. If you do not have a mask, one will be given to you upon arrival. For doctor visits, patients may have 1 support person aged 47 or older with them. For treatment visits, patients cannot have anyone with them due to current Covid guidelines and our immunocompromised population.

## 2020-09-13 ENCOUNTER — Other Ambulatory Visit: Payer: Self-pay

## 2020-09-13 ENCOUNTER — Inpatient Hospital Stay: Payer: BC Managed Care – PPO

## 2020-09-13 VITALS — BP 144/86 | HR 72 | Resp 16

## 2020-09-13 DIAGNOSIS — Z5112 Encounter for antineoplastic immunotherapy: Secondary | ICD-10-CM | POA: Diagnosis not present

## 2020-09-13 DIAGNOSIS — C50411 Malignant neoplasm of upper-outer quadrant of right female breast: Secondary | ICD-10-CM

## 2020-09-13 DIAGNOSIS — Z170 Estrogen receptor positive status [ER+]: Secondary | ICD-10-CM

## 2020-09-13 MED ORDER — PEGFILGRASTIM-CBQV 6 MG/0.6ML ~~LOC~~ SOSY
6.0000 mg | PREFILLED_SYRINGE | Freq: Once | SUBCUTANEOUS | Status: AC
  Administered 2020-09-13: 6 mg via SUBCUTANEOUS

## 2020-09-13 MED ORDER — PEGFILGRASTIM-CBQV 6 MG/0.6ML ~~LOC~~ SOSY
PREFILLED_SYRINGE | SUBCUTANEOUS | Status: AC
  Filled 2020-09-13: qty 0.6

## 2020-09-13 NOTE — Patient Instructions (Signed)
Pegfilgrastim injection What is this medicine? PEGFILGRASTIM (PEG fil gra stim) is a long-acting granulocyte colony-stimulating factor that stimulates the growth of neutrophils, a type of white blood cell important in the body's fight against infection. It is used to reduce the incidence of fever and infection in patients with certain types of cancer who are receiving chemotherapy that affects the bone marrow, and to increase survival after being exposed to high doses of radiation. This medicine may be used for other purposes; ask your health care provider or pharmacist if you have questions. COMMON BRAND NAME(S): Fulphila, Neulasta, Nyvepria, UDENYCA, Ziextenzo What should I tell my health care provider before I take this medicine? They need to know if you have any of these conditions:  kidney disease  latex allergy  ongoing radiation therapy  sickle cell disease  skin reactions to acrylic adhesives (On-Body Injector only)  an unusual or allergic reaction to pegfilgrastim, filgrastim, other medicines, foods, dyes, or preservatives  pregnant or trying to get pregnant  breast-feeding How should I use this medicine? This medicine is for injection under the skin. If you get this medicine at home, you will be taught how to prepare and give the pre-filled syringe or how to use the On-body Injector. Refer to the patient Instructions for Use for detailed instructions. Use exactly as directed. Tell your healthcare provider immediately if you suspect that the On-body Injector may not have performed as intended or if you suspect the use of the On-body Injector resulted in a missed or partial dose. It is important that you put your used needles and syringes in a special sharps container. Do not put them in a trash can. If you do not have a sharps container, call your pharmacist or healthcare provider to get one. Talk to your pediatrician regarding the use of this medicine in children. While this drug  may be prescribed for selected conditions, precautions do apply. Overdosage: If you think you have taken too much of this medicine contact a poison control center or emergency room at once. NOTE: This medicine is only for you. Do not share this medicine with others. What if I miss a dose? It is important not to miss your dose. Call your doctor or health care professional if you miss your dose. If you miss a dose due to an On-body Injector failure or leakage, a new dose should be administered as soon as possible using a single prefilled syringe for manual use. What may interact with this medicine? Interactions have not been studied. This list may not describe all possible interactions. Give your health care provider a list of all the medicines, herbs, non-prescription drugs, or dietary supplements you use. Also tell them if you smoke, drink alcohol, or use illegal drugs. Some items may interact with your medicine. What should I watch for while using this medicine? Your condition will be monitored carefully while you are receiving this medicine. You may need blood work done while you are taking this medicine. Talk to your health care provider about your risk of cancer. You may be more at risk for certain types of cancer if you take this medicine. If you are going to need a MRI, CT scan, or other procedure, tell your doctor that you are using this medicine (On-Body Injector only). What side effects may I notice from receiving this medicine? Side effects that you should report to your doctor or health care professional as soon as possible:  allergic reactions (skin rash, itching or hives, swelling of   the face, lips, or tongue)  back pain  dizziness  fever  pain, redness, or irritation at site where injected  pinpoint red spots on the skin  red or dark-brown urine  shortness of breath or breathing problems  stomach or side pain, or pain at the shoulder  swelling  tiredness  trouble  passing urine or change in the amount of urine  unusual bruising or bleeding Side effects that usually do not require medical attention (report to your doctor or health care professional if they continue or are bothersome):  bone pain  muscle pain This list may not describe all possible side effects. Call your doctor for medical advice about side effects. You may report side effects to FDA at 1-800-FDA-1088. Where should I keep my medicine? Keep out of the reach of children. If you are using this medicine at home, you will be instructed on how to store it. Throw away any unused medicine after the expiration date on the label. NOTE: This sheet is a summary. It may not cover all possible information. If you have questions about this medicine, talk to your doctor, pharmacist, or health care provider.  2021 Elsevier/Gold Standard (2019-04-21 13:20:51)  

## 2020-09-17 ENCOUNTER — Other Ambulatory Visit: Payer: Self-pay | Admitting: Hematology and Oncology

## 2020-09-17 DIAGNOSIS — Z170 Estrogen receptor positive status [ER+]: Secondary | ICD-10-CM

## 2020-09-17 DIAGNOSIS — C50411 Malignant neoplasm of upper-outer quadrant of right female breast: Secondary | ICD-10-CM

## 2020-09-18 ENCOUNTER — Other Ambulatory Visit: Payer: Self-pay | Admitting: *Deleted

## 2020-09-18 ENCOUNTER — Inpatient Hospital Stay: Payer: BC Managed Care – PPO

## 2020-09-18 ENCOUNTER — Other Ambulatory Visit: Payer: Self-pay

## 2020-09-18 VITALS — BP 114/70 | HR 86 | Temp 98.4°F | Resp 16

## 2020-09-18 DIAGNOSIS — Z5112 Encounter for antineoplastic immunotherapy: Secondary | ICD-10-CM | POA: Diagnosis not present

## 2020-09-18 DIAGNOSIS — Z170 Estrogen receptor positive status [ER+]: Secondary | ICD-10-CM

## 2020-09-18 DIAGNOSIS — C50411 Malignant neoplasm of upper-outer quadrant of right female breast: Secondary | ICD-10-CM

## 2020-09-18 MED ORDER — SODIUM CHLORIDE 0.9 % IV SOLN
INTRAVENOUS | Status: DC
  Filled 2020-09-18: qty 250

## 2020-09-18 MED ORDER — HEPARIN SOD (PORK) LOCK FLUSH 100 UNIT/ML IV SOLN
500.0000 [IU] | INTRAVENOUS | Status: AC | PRN
  Administered 2020-09-18: 500 [IU]
  Filled 2020-09-18: qty 5

## 2020-09-18 MED ORDER — SODIUM CHLORIDE 0.9% FLUSH
10.0000 mL | INTRAVENOUS | Status: AC | PRN
  Administered 2020-09-18: 10 mL
  Filled 2020-09-18: qty 10

## 2020-09-24 ENCOUNTER — Encounter: Payer: Self-pay | Admitting: *Deleted

## 2020-09-25 ENCOUNTER — Ambulatory Visit: Payer: BC Managed Care – PPO

## 2020-09-25 ENCOUNTER — Other Ambulatory Visit: Payer: BC Managed Care – PPO

## 2020-09-25 ENCOUNTER — Ambulatory Visit: Payer: BC Managed Care – PPO | Admitting: Hematology and Oncology

## 2020-09-27 ENCOUNTER — Ambulatory Visit: Payer: BC Managed Care – PPO

## 2020-09-27 ENCOUNTER — Encounter: Payer: Self-pay | Admitting: *Deleted

## 2020-09-30 ENCOUNTER — Telehealth: Payer: Self-pay | Admitting: *Deleted

## 2020-09-30 NOTE — Progress Notes (Signed)
Patient Care Team: Caryl Bis, MD as PCP - General (Family Medicine) Mauro Kaufmann, RN as Oncology Nurse Navigator Rockwell Germany, RN as Oncology Nurse Navigator Rolm Bookbinder, MD as Consulting Physician (General Surgery) Nicholas Lose, MD as Consulting Physician (Hematology and Oncology) Kyung Rudd, MD as Consulting Physician (Radiation Oncology)  DIAGNOSIS:    ICD-10-CM   1. Malignant neoplasm of upper-outer quadrant of right breast in female, estrogen receptor positive ([place])  C50.411    Z17.0       SUMMARY OF ONCOLOGIC HISTORY: Oncology History  Malignant neoplasm of upper-outer quadrant of right breast in female, estrogen receptor positive ([place])  07/08/2020 Initial Diagnosis   Screening mammogram showed right breast calcifications. Diagnostic mammogram showed right breast calcifications spanning 4.1cm and no abnormal right axillary lymph nodes. 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   07/24/2020 -  Chemotherapy    Patient is on Treatment Plan: BREAST  DOCETAXEL + CARBOPLATIN + TRASTUZUMAB + PERTUZUMAB  (TCHP) Q21D          CHIEF COMPLIANT: Cycle 4 Day 1 TCH Perjeta  INTERVAL HISTORY: Sandy Goodwin is a 50 y.o. with above-mentioned history of  breast cancer currently on neoadjuvant chemotherapy with TCH Perjeta. She presents to the clinic today for cycle 4.  After the last cycle of chemotherapy she had 1 day of 14-15 loose stools.  The day after that she received IV fluids which made it significantly better.  Maculopapular rash appears to be disappearing with doxycycline.  Did not have significant bone pain and did not require tramadol.  ALLERGIES:  is allergic to codeine and hydrocodone.  MEDICATIONS:  Current Outpatient Medications  Medication Sig Dispense Refill   BYSTOLIC 5 MG tablet Take 1 tablet by mouth daily.  1   cetirizine (ZYRTEC) 10 MG tablet Take 10 mg by mouth daily.      clobetasol ointment (TEMOVATE) 0.05 % Apply topically 2 (two) times daily.     dexamethasone (DECADRON) 4 MG tablet Take 1 tablet (4 mg total) by mouth daily. Take 1 tablet day before chemo and 1 tablet day after chemo with food 12 tablet 0   doxycycline (VIBRAMYCIN) 50 MG capsule Take 50 mg by mouth daily.     levothyroxine (SYNTHROID, LEVOTHROID) 50 MCG tablet Take 50 mcg by mouth daily before breakfast.     lidocaine (XYLOCAINE) 2 % solution 5 ml swish and spit q 3 hours prn mouth pain 200 mL 2   lidocaine-prilocaine (EMLA) cream Apply to affected area once 30 g 3   LORazepam (ATIVAN) 0.5 MG tablet Take 1 tablet (0.45m tablet) at bedtime, as needed for nausea. 30 tablet 0   magic mouthwash (nystatin, diphenhydrAMINE, alum & mag hydroxide) suspension mixture Swish and spit 5 mLs 4 (four) times daily as needed for mouth pain. 240 mL 3   meloxicam (MOBIC) 15 MG tablet Take 15 mg by mouth daily.     metroNIDAZOLE (METROCREAM) 0.75 % cream Apply 1 application topically 2 (two) times daily.     ondansetron (ZOFRAN) 8 MG tablet Take 1 tablet (8 mg total) by mouth 2 (two) times daily as needed (Nausea or vomiting). Start on the third day after chemotherapy. 30 tablet 1   phenazopyridine (PYRIDIUM) 200 MG tablet Take 1 tablet (200 mg total) by mouth 3 (three) times daily as needed for pain. 21 tablet 1   prochlorperazine (COMPAZINE) 10 MG tablet TAKE 1  TABLET BY MOUTH EVERY 6 HOURS AS NEEDED FOR NAUSEA OR FOR VOMITING 30 tablet 1   traMADol (ULTRAM) 50 MG tablet Take 1 tablet (50 mg total) by mouth every 6 (six) hours as needed. 30 tablet 0   triamcinolone (NASACORT) 55 MCG/ACT AERO nasal inhaler Place 2 sprays into the nose daily.     Turmeric 500 MG CAPS Take by mouth.     valACYclovir (VALTREX) 1000 MG tablet Take 1 tablet (1,000 mg total) by mouth 2 (two) times daily. 14 tablet 2   No current facility-administered medications for this visit.    PHYSICAL EXAMINATION: ECOG PERFORMANCE STATUS:  1 - Symptomatic but completely ambulatory  Vitals:   10/01/20 1016  BP: 140/81  Pulse: 71  Resp: 18  Temp: 97.7 F (36.5 C)  SpO2: 100%   Filed Weights   10/01/20 1016  Weight: 201 lb 9.6 oz (91.4 kg)    LABORATORY DATA:  I have reviewed the data as listed CMP Latest Ref Rng & Units 09/11/2020 08/14/2020 08/05/2020  Glucose 70 - 99 mg/dL 91 98 80  BUN 6 - 20 mg/dL _0 Creatinine 0.44 - 1.00 mg/dL 0.70 0.71 0.67  Sodium 135 - 145 mmol/L 140 141 139  Potassium 3.5 - 5.1 mmol/L 3.6 3.6 3.8  Chloride 98 - 111 mmol/L 105 105 105  CO2 22 - 32 mmol/L _1 Calcium 8.9 - 10.3 mg/dL 9.1 8.8(L) 8.7(L)  Total Protein 6.5 - 8.1 g/dL 7.0 7.0 7.3  Total Bilirubin 0.3 - 1.2 mg/dL 0.3 0.4 0.4  Alkaline Phos 38 - 126 U/L 94 90 114  AST 15 - 41 U/L _2 ALT 0 - 44 U/L _3 Lab Results  Component Value Date   WBC 6.9 10/01/2020   HGB 11.6 (L) 10/01/2020   HCT 34.1 (L) 10/01/2020   MCV 91.4 10/01/2020   PLT 231 10/01/2020   NEUTROABS 3.5 10/01/2020    ASSESSMENT & PLAN:  Malignant neoplasm of upper-outer quadrant of right breast in female, estrogen receptor positive ([place]) 07/08/2020: Screening mammogram showed right breast calcifications. Diagnostic mammogram showed right breast calcifications spanning 4.1cm and no abnormal right axillary lymph nodes. 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 Genetics done in 2019: Negative   Treatment plan: 1. Neoadjuvant chemotherapy with TCH Perjeta 6 cycles followed by Herceptin Perjeta maintenance versus Kadcyla maintenance (based on response to neoadjuvant chemo) for 1 year 2. Followed by breast conserving surgery if possible with sentinel lymph node study 3. 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 URCC nausea  study --------------------------------------------------------------------------------------------------------------------------------------- Current treatment: Cycle 4 TCHP Chemo toxicities: 1.  Severe diarrhea: On 1 day with the last chemo she developed a 14-15 loose stools.  She only took half a tablet of Imodium because she is worried about constipation. 2. mucositis: We decreased the dosage of Taxotere for cycle 2.  I had already reduced carboplatin dose to 700 mg.  She now has Magic mouthwash. 3.  Bone pain: She is doing better without requiring any tramadol. 4.  Skin rash: Maculopapular after each chemo.  It improves with doxycycline.   Ordered an echocardiogram. Labs reviewed. Return to clinic for cycle 5 chemo    No orders of the defined types were placed in this encounter.  The patient has  a good understanding of the overall plan. she agrees with it. she will call with any problems that may develop before the next visit here.  Total time spent: 30 mins including face to face time and time spent for planning, charting and coordination of care  Rulon Eisenmenger, MD, MPH 10/01/2020  I, Thana Ates, am acting as scribe for Dr. Nicholas Lose.  I have reviewed the above documentation for accuracy and completeness, and I agree with the above.

## 2020-09-30 NOTE — Telephone Encounter (Signed)
Notified that she is OK to proceed with chemo tomorrow. Will call if experiencing any symptoms

## 2020-09-30 NOTE — Assessment & Plan Note (Signed)
07/08/2020:Screening mammogram showed right breast calcifications. Diagnostic mammogram showed right breast calcifications spanning 4.1cm and no abnormal right axillary lymph nodes. 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 Genetics done in 2019: Negative  Treatment plan: 1. Neoadjuvant chemotherapy with TCH Perjeta 6 cycles followed by Herceptin Perjeta maintenance versus Kadcyla maintenance (based on response to neoadjuvant chemo) for 1 year 2. Followed by breast conserving surgery if possible with sentinel lymph node study 3. 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 URCCnausea study --------------------------------------------------------------------------------------------------------------------------------------- Current treatment: Cycle4TCHP Chemo toxicities: 1.Severe diarrhea:Much improved.   She takes Imodium as needed.  We are giving IV fluids a week after chemo. 2.mucositis: Wedecreased the dosage of Taxotere for cycle 2. I had already reduced carboplatin dose to 700 mg.She now has Magic mouthwash. 3.Bone pain:On tramadol 4.  Skin rash: Maculopapular after each chemo.  I instructed her to use the doxycycline that she has as well as to put some topical cortisone cream as well.4  Labs reviewed. Return to clinic for cycle5chemo

## 2020-09-30 NOTE — Telephone Encounter (Signed)
Pt states her daughter came home from camp and tested positive for covid. States she has not been in close contact and has been wearing a mask when around her. Has chemo tomorrow. Has tested negative with PCR tests, no symptoms. Will retest this afternoon at work. Please advise about taking chemo tomorrow.

## 2020-10-01 ENCOUNTER — Other Ambulatory Visit: Payer: Self-pay

## 2020-10-01 ENCOUNTER — Inpatient Hospital Stay (HOSPITAL_BASED_OUTPATIENT_CLINIC_OR_DEPARTMENT_OTHER): Payer: BC Managed Care – PPO | Admitting: Hematology and Oncology

## 2020-10-01 ENCOUNTER — Inpatient Hospital Stay: Payer: BC Managed Care – PPO

## 2020-10-01 VITALS — BP 113/58 | HR 65 | Temp 98.0°F | Resp 18

## 2020-10-01 VITALS — BP 140/81 | HR 71 | Temp 97.7°F | Resp 18 | Ht 66.0 in | Wt 201.6 lb

## 2020-10-01 DIAGNOSIS — Z5112 Encounter for antineoplastic immunotherapy: Secondary | ICD-10-CM | POA: Diagnosis not present

## 2020-10-01 DIAGNOSIS — C50411 Malignant neoplasm of upper-outer quadrant of right female breast: Secondary | ICD-10-CM

## 2020-10-01 DIAGNOSIS — Z170 Estrogen receptor positive status [ER+]: Secondary | ICD-10-CM

## 2020-10-01 DIAGNOSIS — Z95828 Presence of other vascular implants and grafts: Secondary | ICD-10-CM

## 2020-10-01 LAB — CBC WITH DIFFERENTIAL (CANCER CENTER ONLY)
Abs Immature Granulocytes: 0.01 10*3/uL (ref 0.00–0.07)
Basophils Absolute: 0 10*3/uL (ref 0.0–0.1)
Basophils Relative: 0 %
Eosinophils Absolute: 0 10*3/uL (ref 0.0–0.5)
Eosinophils Relative: 0 %
HCT: 34.1 % — ABNORMAL LOW (ref 36.0–46.0)
Hemoglobin: 11.6 g/dL — ABNORMAL LOW (ref 12.0–15.0)
Immature Granulocytes: 0 %
Lymphocytes Relative: 36 %
Lymphs Abs: 2.5 10*3/uL (ref 0.7–4.0)
MCH: 31.1 pg (ref 26.0–34.0)
MCHC: 34 g/dL (ref 30.0–36.0)
MCV: 91.4 fL (ref 80.0–100.0)
Monocytes Absolute: 0.9 10*3/uL (ref 0.1–1.0)
Monocytes Relative: 12 %
Neutro Abs: 3.5 10*3/uL (ref 1.7–7.7)
Neutrophils Relative %: 52 %
Platelet Count: 231 10*3/uL (ref 150–400)
RBC: 3.73 MIL/uL — ABNORMAL LOW (ref 3.87–5.11)
RDW: 16.2 % — ABNORMAL HIGH (ref 11.5–15.5)
WBC Count: 6.9 10*3/uL (ref 4.0–10.5)
nRBC: 0 % (ref 0.0–0.2)

## 2020-10-01 LAB — CMP (CANCER CENTER ONLY)
ALT: 39 U/L (ref 0–44)
AST: 30 U/L (ref 15–41)
Albumin: 4.3 g/dL (ref 3.5–5.0)
Alkaline Phosphatase: 98 U/L (ref 38–126)
Anion gap: 7 (ref 5–15)
BUN: 16 mg/dL (ref 6–20)
CO2: 29 mmol/L (ref 22–32)
Calcium: 9.3 mg/dL (ref 8.9–10.3)
Chloride: 103 mmol/L (ref 98–111)
Creatinine: 0.76 mg/dL (ref 0.44–1.00)
GFR, Estimated: >60 mL/min (ref >60)
Glucose, Bld: 90 mg/dL (ref 70–99)
Potassium: 3.9 mmol/L (ref 3.5–5.1)
Sodium: 139 mmol/L (ref 135–145)
Total Bilirubin: 0.5 mg/dL (ref 0.3–1.2)
Total Protein: 7.5 g/dL (ref 6.5–8.1)

## 2020-10-01 MED ORDER — ACETAMINOPHEN 325 MG PO TABS
650.0000 mg | ORAL_TABLET | Freq: Once | ORAL | Status: AC
Start: 2020-10-01 — End: 2020-10-01
  Administered 2020-10-01: 650 mg via ORAL

## 2020-10-01 MED ORDER — PALONOSETRON HCL INJECTION 0.25 MG/5ML
0.2500 mg | Freq: Once | INTRAVENOUS | Status: AC
  Administered 2020-10-01: 0.25 mg via INTRAVENOUS

## 2020-10-01 MED ORDER — SODIUM CHLORIDE 0.9% FLUSH
10.0000 mL | INTRAVENOUS | Status: AC | PRN
  Administered 2020-10-01: 10 mL
  Filled 2020-10-01: qty 10

## 2020-10-01 MED ORDER — DIPHENHYDRAMINE HCL 25 MG PO CAPS
ORAL_CAPSULE | ORAL | Status: AC
  Filled 2020-10-01: qty 2

## 2020-10-01 MED ORDER — DIPHENHYDRAMINE HCL 25 MG PO CAPS
50.0000 mg | ORAL_CAPSULE | Freq: Once | ORAL | Status: AC
Start: 2020-10-01 — End: 2020-10-01
  Administered 2020-10-01: 50 mg via ORAL

## 2020-10-01 MED ORDER — DEXAMETHASONE SODIUM PHOSPHATE 100 MG/10ML IJ SOLN
10.0000 mg | Freq: Once | INTRAMUSCULAR | Status: AC
  Administered 2020-10-01: 10 mg via INTRAVENOUS
  Filled 2020-10-01: qty 10

## 2020-10-01 MED ORDER — LORAZEPAM 0.5 MG PO TABS: ORAL_TABLET | ORAL | 0 refills | Status: DC

## 2020-10-01 MED ORDER — SODIUM CHLORIDE 0.9 % IV SOLN
700.0000 mg | Freq: Once | INTRAVENOUS | Status: AC
  Administered 2020-10-01: 700 mg via INTRAVENOUS
  Filled 2020-10-01: qty 70

## 2020-10-01 MED ORDER — SODIUM CHLORIDE 0.9 % IV SOLN
150.0000 mg | Freq: Once | INTRAVENOUS | Status: AC
  Administered 2020-10-01: 150 mg via INTRAVENOUS
  Filled 2020-10-01: qty 150

## 2020-10-01 MED ORDER — HEPARIN SOD (PORK) LOCK FLUSH 100 UNIT/ML IV SOLN
500.0000 [IU] | Freq: Once | INTRAVENOUS | Status: AC | PRN
  Administered 2020-10-01: 500 [IU]
  Filled 2020-10-01: qty 5

## 2020-10-01 MED ORDER — PALONOSETRON HCL INJECTION 0.25 MG/5ML
INTRAVENOUS | Status: AC
  Filled 2020-10-01: qty 5

## 2020-10-01 MED ORDER — TRASTUZUMAB-DKST CHEMO 150 MG IV SOLR
6.0000 mg/kg | Freq: Once | INTRAVENOUS | Status: AC
  Administered 2020-10-01: 546 mg via INTRAVENOUS
  Filled 2020-10-01: qty 26

## 2020-10-01 MED ORDER — SODIUM CHLORIDE 0.9 % IV SOLN
65.0000 mg/m2 | Freq: Once | INTRAVENOUS | Status: AC
  Administered 2020-10-01: 130 mg via INTRAVENOUS
  Filled 2020-10-01: qty 13

## 2020-10-01 MED ORDER — ACETAMINOPHEN 325 MG PO TABS
ORAL_TABLET | ORAL | Status: AC
  Filled 2020-10-01: qty 2

## 2020-10-01 MED ORDER — SODIUM CHLORIDE 0.9 % IV SOLN
420.0000 mg | Freq: Once | INTRAVENOUS | Status: AC
  Administered 2020-10-01: 420 mg via INTRAVENOUS
  Filled 2020-10-01: qty 14

## 2020-10-01 MED ORDER — SODIUM CHLORIDE 0.9 % IV SOLN
Freq: Once | INTRAVENOUS | Status: AC
  Filled 2020-10-01: qty 250

## 2020-10-01 MED ORDER — SODIUM CHLORIDE 0.9% FLUSH
10.0000 mL | INTRAVENOUS | Status: DC | PRN
  Administered 2020-10-01: 10 mL
  Filled 2020-10-01: qty 10

## 2020-10-02 ENCOUNTER — Ambulatory Visit: Payer: BC Managed Care – PPO

## 2020-10-03 ENCOUNTER — Other Ambulatory Visit: Payer: Self-pay

## 2020-10-03 ENCOUNTER — Inpatient Hospital Stay: Payer: BC Managed Care – PPO

## 2020-10-03 VITALS — BP 122/75 | HR 77 | Temp 98.2°F | Resp 16

## 2020-10-03 DIAGNOSIS — Z170 Estrogen receptor positive status [ER+]: Secondary | ICD-10-CM

## 2020-10-03 DIAGNOSIS — C50411 Malignant neoplasm of upper-outer quadrant of right female breast: Secondary | ICD-10-CM

## 2020-10-03 DIAGNOSIS — Z5112 Encounter for antineoplastic immunotherapy: Secondary | ICD-10-CM | POA: Diagnosis not present

## 2020-10-03 MED ORDER — PEGFILGRASTIM-CBQV 6 MG/0.6ML ~~LOC~~ SOSY
PREFILLED_SYRINGE | SUBCUTANEOUS | Status: AC
  Filled 2020-10-03: qty 0.6

## 2020-10-03 MED ORDER — PEGFILGRASTIM-CBQV 6 MG/0.6ML ~~LOC~~ SOSY
6.0000 mg | PREFILLED_SYRINGE | Freq: Once | SUBCUTANEOUS | Status: AC
  Administered 2020-10-03: 6 mg via SUBCUTANEOUS

## 2020-10-04 ENCOUNTER — Encounter: Payer: Self-pay | Admitting: *Deleted

## 2020-10-04 ENCOUNTER — Telehealth: Payer: Self-pay | Admitting: *Deleted

## 2020-10-04 ENCOUNTER — Telehealth: Payer: Self-pay | Admitting: Adult Health

## 2020-10-04 ENCOUNTER — Telehealth: Payer: Self-pay | Admitting: Hematology and Oncology

## 2020-10-04 NOTE — Telephone Encounter (Signed)
Scheduled appointmetns per 06/24 staff message. Left message.

## 2020-10-04 NOTE — Progress Notes (Signed)
Covid infusion clinic unable to treat pt today.  Per Wilber Bihari, NP pt needing to go to the [facility] emergency department for further evaluation and treatment of fever and Covid infection.  Pt verbalized understanding. RN placed call to charge RN Marzetta Board in [facility] ED to alert staff of pt arrival from home.

## 2020-10-04 NOTE — Telephone Encounter (Signed)
Received message that patient has fever of 101 and recently received chemotherapy 4 days ago.  She tested positive for COVID as well.  I reached out to Jena Gauss to see if they are able to send stat labs at the [place] outpatient treatment clinic to r/o neutropenia in the setting of Sandy Goodwin recently having received chemo.  They are not, and they do not have availability regardless.   I recommended that the patient go to the ER to get STAT labs to rule out neutropenic fever in the setting of recent chemo.  If they are able to give Bebtelovimab for her COVID 19 as well that would be great, however I defer to the treating clinician in the ER.  Wilber Bihari, NP

## 2020-10-04 NOTE — Telephone Encounter (Signed)
Received call from pt with complaint of chills, fever 101.0 orally, cough, congestion, and body aches x1 day.  Pt preformed home Covid test and has tested positive.  Wilber Bihari, NP notified to arrange pt for Covid monoclonal antibody infusion.

## 2020-10-04 NOTE — ED Notes (Signed)
Per cancer center, states patient tested positive for covid today-chills, fever, body aches-unable to get in infusion clinic

## 2020-10-07 ENCOUNTER — Inpatient Hospital Stay: Payer: BC Managed Care – PPO

## 2020-10-16 ENCOUNTER — Ambulatory Visit: Payer: BC Managed Care – PPO

## 2020-10-16 ENCOUNTER — Other Ambulatory Visit: Payer: BC Managed Care – PPO

## 2020-10-16 ENCOUNTER — Ambulatory Visit: Payer: BC Managed Care – PPO | Admitting: Hematology and Oncology

## 2020-10-18 ENCOUNTER — Other Ambulatory Visit: Payer: Self-pay

## 2020-10-18 ENCOUNTER — Ambulatory Visit (HOSPITAL_COMMUNITY)
Admission: RE | Admit: 2020-10-18 | Discharge: 2020-10-18 | Disposition: A | Discharge status: Home / Self Care | Payer: BC Managed Care – PPO | Source: Ambulatory Visit | Attending: Hematology and Oncology | Admitting: Hematology and Oncology

## 2020-10-18 ENCOUNTER — Ambulatory Visit: Payer: BC Managed Care – PPO

## 2020-10-18 DIAGNOSIS — Z170 Estrogen receptor positive status [ER+]: Secondary | ICD-10-CM

## 2020-10-18 DIAGNOSIS — I499 Cardiac arrhythmia, unspecified: Secondary | ICD-10-CM | POA: Insufficient documentation

## 2020-10-18 DIAGNOSIS — C50411 Malignant neoplasm of upper-outer quadrant of right female breast: Secondary | ICD-10-CM | POA: Diagnosis not present

## 2020-10-18 DIAGNOSIS — Z0189 Encounter for other specified special examinations: Secondary | ICD-10-CM | POA: Diagnosis not present

## 2020-10-18 DIAGNOSIS — Z01818 Encounter for other preprocedural examination: Secondary | ICD-10-CM | POA: Diagnosis not present

## 2020-10-18 DIAGNOSIS — E039 Hypothyroidism, unspecified: Secondary | ICD-10-CM | POA: Insufficient documentation

## 2020-10-18 LAB — ECHOCARDIOGRAM COMPLETE
Area-P 1/2: 3.85 cm2
S' Lateral: 3.4 cm

## 2020-10-18 NOTE — Progress Notes (Signed)
  Echocardiogram 2D Echocardiogram has been performed.  Sandy Goodwin 10/18/2020, 10:14 AM

## 2020-10-23 ENCOUNTER — Ambulatory Visit: Payer: BC Managed Care – PPO

## 2020-10-23 ENCOUNTER — Other Ambulatory Visit: Payer: BC Managed Care – PPO

## 2020-10-23 ENCOUNTER — Encounter: Payer: Self-pay | Admitting: *Deleted

## 2020-10-23 ENCOUNTER — Ambulatory Visit: Payer: BC Managed Care – PPO | Admitting: Hematology and Oncology

## 2020-10-24 NOTE — Progress Notes (Signed)
Patient Care Team: Loa Socks, MD as PCP - General (Family Medicine) Mauro Kaufmann, RN as Oncology Nurse Navigator Rockwell Germany, RN as Oncology Nurse Navigator Rolm Bookbinder, MD as Consulting Physician (General Surgery) Nicholas Lose, MD as Consulting Physician (Hematology and Oncology) Kyung Rudd, MD as Consulting Physician (Radiation Oncology)  DIAGNOSIS:    ICD-10-CM   1. Malignant neoplasm of upper-outer quadrant of right breast in female, estrogen receptor positive ([place])  C50.411    Z17.0       SUMMARY OF ONCOLOGIC HISTORY: Oncology History  Malignant neoplasm of upper-outer quadrant of right breast in female, estrogen receptor positive ([place])  07/08/2020 Initial Diagnosis   Screening mammogram showed right breast calcifications. Diagnostic mammogram showed right breast calcifications spanning 4.1cm and no abnormal right axillary lymph nodes. 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   07/24/2020 -  Chemotherapy    Patient is on Treatment Plan: BREAST  DOCETAXEL + CARBOPLATIN + TRASTUZUMAB + PERTUZUMAB  (TCHP) Q21D          CHIEF COMPLIANT: Cycle 5 TCH Perjeta  INTERVAL HISTORY: Sandy Goodwin is a 50 y.o. with above-mentioned history of breast cancer currently on neoadjuvant chemotherapy with TCH Perjeta. She presents to the clinic today for cycle 5.  She is tolerating treatment reasonably well.  She does have diarrhea sometimes very significant severe and she takes Imodium to control it.  She thinks that she is able to handle the diarrhea very well.  Did have some mouth sores as well but they got better.  The rash appears to have resolved.  ALLERGIES:  is allergic to codeine and hydrocodone.  MEDICATIONS:  Current Outpatient Medications  Medication Sig Dispense Refill   BYSTOLIC 5 MG tablet Take 1 tablet by mouth daily.  1   cetirizine (ZYRTEC) 10 MG tablet Take 10 mg by mouth  daily.     clobetasol ointment (TEMOVATE) 0.05 % Apply topically 2 (two) times daily.     dexamethasone (DECADRON) 4 MG tablet Take 1 tablet (4 mg total) by mouth daily. Take 1 tablet day before chemo and 1 tablet day after chemo with food 12 tablet 0   doxycycline (VIBRAMYCIN) 50 MG capsule Take 50 mg by mouth daily.     levothyroxine (SYNTHROID, LEVOTHROID) 50 MCG tablet Take 50 mcg by mouth daily before breakfast.     lidocaine (XYLOCAINE) 2 % solution 5 ml swish and spit q 3 hours prn mouth pain 200 mL 2   lidocaine-prilocaine (EMLA) cream Apply to affected area once 30 g 3   LORazepam (ATIVAN) 0.5 MG tablet Take 1 tablet (0.85m tablet) at bedtime, as needed for nausea. 30 tablet 0   magic mouthwash (nystatin, diphenhydrAMINE, alum & mag hydroxide) suspension mixture Swish and spit 5 mLs 4 (four) times daily as needed for mouth pain. 240 mL 3   meloxicam (MOBIC) 15 MG tablet Take 15 mg by mouth daily.     metroNIDAZOLE (METROCREAM) 0.75 % cream Apply 1 application topically 2 (two) times daily.     ondansetron (ZOFRAN) 8 MG tablet Take 1 tablet (8 mg total) by mouth 2 (two) times daily as needed (Nausea or vomiting). Start on the third day after chemotherapy. 30 tablet 1   phenazopyridine (PYRIDIUM) 200 MG tablet Take 1 tablet (200 mg total) by mouth 3 (three) times daily as needed for pain. 21 tablet 1   prochlorperazine (COMPAZINE) 10 MG tablet  TAKE 1 TABLET BY MOUTH EVERY 6 HOURS AS NEEDED FOR NAUSEA OR FOR VOMITING 30 tablet 1   traMADol (ULTRAM) 50 MG tablet Take 1 tablet (50 mg total) by mouth every 6 (six) hours as needed. 30 tablet 0   triamcinolone (NASACORT) 55 MCG/ACT AERO nasal inhaler Place 2 sprays into the nose daily.     Turmeric 500 MG CAPS Take by mouth.     valACYclovir (VALTREX) 1000 MG tablet Take 1 tablet (1,000 mg total) by mouth 2 (two) times daily. 14 tablet 2   No current facility-administered medications for this visit.    PHYSICAL EXAMINATION: ECOG PERFORMANCE  STATUS: 1 - Symptomatic but completely ambulatory  Vitals:   10/25/20 0933  BP: 122/73  Pulse: 69  Resp: 15  Temp: 97.7 F (36.5 C)  SpO2: 100%   Filed Weights   10/25/20 0933  Weight: 201 lb 12.8 oz (91.5 kg)     LABORATORY DATA:  I have reviewed the data as listed CMP Latest Ref Rng & Units 10/01/2020 09/11/2020 08/14/2020  Glucose 70 - 99 mg/dL 90 91 98  BUN 6 - 20 mg/dL _0 Creatinine 0.44 - 1.00 mg/dL 0.76 0.70 0.71  Sodium 135 - 145 mmol/L 139 140 141  Potassium 3.5 - 5.1 mmol/L 3.9 3.6 3.6  Chloride 98 - 111 mmol/L 103 105 105  CO2 22 - 32 mmol/L _1 Calcium 8.9 - 10.3 mg/dL 9.3 9.1 8.8(L)  Total Protein 6.5 - 8.1 g/dL 7.5 7.0 7.0  Total Bilirubin 0.3 - 1.2 mg/dL 0.5 0.3 0.4  Alkaline Phos 38 - 126 U/L 98 94 90  AST 15 - 41 U/L _2 ALT 0 - 44 U/L 39 23 26    Lab Results  Component Value Date   WBC 6.5 10/25/2020   HGB 11.2 (L) 10/25/2020   HCT 32.6 (L) 10/25/2020   MCV 94.2 10/25/2020   PLT 163 10/25/2020   NEUTROABS 3.1 10/25/2020    ASSESSMENT & PLAN:  Malignant neoplasm of upper-outer quadrant of right breast in female, estrogen receptor positive ([place]) 07/08/2020: Screening mammogram showed right breast calcifications. Diagnostic mammogram showed right breast calcifications spanning 4.1cm and no abnormal right axillary lymph nodes. 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 Genetics done in 2019: Negative   Treatment plan: 1. Neoadjuvant chemotherapy with TCH Perjeta 6 cycles followed by Herceptin Perjeta maintenance versus Kadcyla maintenance (based on response to neoadjuvant chemo) for 1 year 2. Followed by breast conserving surgery if possible with sentinel lymph node study (patient plans to have breast reduction with Dr. Iran Planas) 3. 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 URCC nausea study --------------------------------------------------------------------------------------------------------------------------------------- Current treatment: Cycle 5 TCHP Chemo toxicities: 1.  Severe diarrhea: She is doing much better with hydration and also Imodium. 2. mucositis: We decreased the dosage of Taxotere for cycle 2.  I had already reduced carboplatin dose to 700 mg.  She now has Magic mouthwash. 3.  Bone pain: She is doing better without requiring any tramadol. 4.  Skin rash: Resolved   Labs reviewed. Return to clinic for cycle 6 chemo We will arrange for breast MRI after cycle 6. I will request for surgery appointment as well as presentation of the tumor board after that. She will likely receive 1 more dose of Herceptin and  Perjeta prior to surgery.  We will arrange for that.    No orders of the defined types were placed in this encounter.  The patient has a good understanding of the overall plan. she agrees with it. she will call with any problems that may develop before the next visit here.  Total time spent: 30 mins including face to face time and time spent for planning, charting and coordination of care  Rulon Eisenmenger, MD, MPH 10/25/2020  I, Thana Ates, am acting as scribe for Dr. Nicholas Lose.  I have reviewed the above documentation for accuracy and completeness, and I agree with the above.

## 2020-10-25 ENCOUNTER — Inpatient Hospital Stay: Payer: BC Managed Care – PPO | Attending: Hematology and Oncology

## 2020-10-25 ENCOUNTER — Other Ambulatory Visit: Payer: Self-pay

## 2020-10-25 ENCOUNTER — Inpatient Hospital Stay (HOSPITAL_BASED_OUTPATIENT_CLINIC_OR_DEPARTMENT_OTHER): Payer: BC Managed Care – PPO | Admitting: Hematology and Oncology

## 2020-10-25 ENCOUNTER — Ambulatory Visit: Payer: BC Managed Care – PPO | Admitting: Adult Health

## 2020-10-25 ENCOUNTER — Other Ambulatory Visit: Payer: BC Managed Care – PPO

## 2020-10-25 ENCOUNTER — Ambulatory Visit: Payer: BC Managed Care – PPO

## 2020-10-25 ENCOUNTER — Encounter: Payer: Self-pay | Admitting: *Deleted

## 2020-10-25 ENCOUNTER — Inpatient Hospital Stay: Payer: BC Managed Care – PPO

## 2020-10-25 DIAGNOSIS — K1231 Oral mucositis (ulcerative) due to antineoplastic therapy: Secondary | ICD-10-CM | POA: Diagnosis not present

## 2020-10-25 DIAGNOSIS — C50411 Malignant neoplasm of upper-outer quadrant of right female breast: Secondary | ICD-10-CM

## 2020-10-25 DIAGNOSIS — Z5111 Encounter for antineoplastic chemotherapy: Secondary | ICD-10-CM | POA: Insufficient documentation

## 2020-10-25 DIAGNOSIS — Z170 Estrogen receptor positive status [ER+]: Secondary | ICD-10-CM | POA: Diagnosis not present

## 2020-10-25 DIAGNOSIS — Z5189 Encounter for other specified aftercare: Secondary | ICD-10-CM | POA: Diagnosis not present

## 2020-10-25 DIAGNOSIS — Z95828 Presence of other vascular implants and grafts: Secondary | ICD-10-CM

## 2020-10-25 DIAGNOSIS — Z5112 Encounter for antineoplastic immunotherapy: Secondary | ICD-10-CM | POA: Insufficient documentation

## 2020-10-25 LAB — CMP (CANCER CENTER ONLY)
ALT: 27 U/L (ref 0–44)
AST: 21 U/L (ref 15–41)
Albumin: 3.8 g/dL (ref 3.5–5.0)
Alkaline Phosphatase: 101 U/L (ref 38–126)
Anion gap: 10 (ref 5–15)
BUN: 17 mg/dL (ref 6–20)
CO2: 26 mmol/L (ref 22–32)
Calcium: 9.2 mg/dL (ref 8.9–10.3)
Chloride: 104 mmol/L (ref 98–111)
Creatinine: 0.77 mg/dL (ref 0.44–1.00)
GFR, Estimated: >60 mL/min (ref >60)
Glucose, Bld: 87 mg/dL (ref 70–99)
Potassium: 3.7 mmol/L (ref 3.5–5.1)
Sodium: 140 mmol/L (ref 135–145)
Total Bilirubin: 0.4 mg/dL (ref 0.3–1.2)
Total Protein: 7.2 g/dL (ref 6.5–8.1)

## 2020-10-25 LAB — CBC WITH DIFFERENTIAL (CANCER CENTER ONLY)
Abs Immature Granulocytes: 0.02 10*3/uL (ref 0.00–0.07)
Basophils Absolute: 0 10*3/uL (ref 0.0–0.1)
Basophils Relative: 1 %
Eosinophils Absolute: 0.1 10*3/uL (ref 0.0–0.5)
Eosinophils Relative: 1 %
HCT: 32.6 % — ABNORMAL LOW (ref 36.0–46.0)
Hemoglobin: 11.2 g/dL — ABNORMAL LOW (ref 12.0–15.0)
Immature Granulocytes: 0 %
Lymphocytes Relative: 38 %
Lymphs Abs: 2.5 10*3/uL (ref 0.7–4.0)
MCH: 32.4 pg (ref 26.0–34.0)
MCHC: 34.4 g/dL (ref 30.0–36.0)
MCV: 94.2 fL (ref 80.0–100.0)
Monocytes Absolute: 0.9 10*3/uL (ref 0.1–1.0)
Monocytes Relative: 14 %
Neutro Abs: 3.1 10*3/uL (ref 1.7–7.7)
Neutrophils Relative %: 46 %
Platelet Count: 163 10*3/uL (ref 150–400)
RBC: 3.46 MIL/uL — ABNORMAL LOW (ref 3.87–5.11)
RDW: 16.9 % — ABNORMAL HIGH (ref 11.5–15.5)
WBC Count: 6.5 10*3/uL (ref 4.0–10.5)
nRBC: 0 % (ref 0.0–0.2)

## 2020-10-25 MED ORDER — DIPHENHYDRAMINE HCL 25 MG PO CAPS
ORAL_CAPSULE | ORAL | Status: AC
  Filled 2020-10-25: qty 2

## 2020-10-25 MED ORDER — ACETAMINOPHEN 325 MG PO TABS
650.0000 mg | ORAL_TABLET | Freq: Once | ORAL | Status: AC
  Administered 2020-10-25: 650 mg via ORAL

## 2020-10-25 MED ORDER — SODIUM CHLORIDE 0.9 % IV SOLN
420.0000 mg | Freq: Once | INTRAVENOUS | Status: AC
  Administered 2020-10-25: 420 mg via INTRAVENOUS
  Filled 2020-10-25: qty 14

## 2020-10-25 MED ORDER — SODIUM CHLORIDE 0.9 % IV SOLN
65.0000 mg/m2 | Freq: Once | INTRAVENOUS | Status: AC
  Administered 2020-10-25: 130 mg via INTRAVENOUS
  Filled 2020-10-25: qty 13

## 2020-10-25 MED ORDER — SODIUM CHLORIDE 0.9 % IV SOLN
150.0000 mg | Freq: Once | INTRAVENOUS | Status: AC
  Administered 2020-10-25: 150 mg via INTRAVENOUS
  Filled 2020-10-25: qty 150

## 2020-10-25 MED ORDER — PALONOSETRON HCL INJECTION 0.25 MG/5ML
INTRAVENOUS | Status: AC
  Filled 2020-10-25: qty 5

## 2020-10-25 MED ORDER — SODIUM CHLORIDE 0.9 % IV SOLN
Freq: Once | INTRAVENOUS | Status: AC
Start: 2020-10-25 — End: 2020-10-25
  Filled 2020-10-25: qty 250

## 2020-10-25 MED ORDER — SODIUM CHLORIDE 0.9 % IV SOLN
700.0000 mg | Freq: Once | INTRAVENOUS | Status: AC
  Administered 2020-10-25: 700 mg via INTRAVENOUS
  Filled 2020-10-25: qty 70

## 2020-10-25 MED ORDER — SODIUM CHLORIDE 0.9% FLUSH
10.0000 mL | INTRAVENOUS | Status: DC | PRN
  Administered 2020-10-25: 10 mL
  Filled 2020-10-25: qty 10

## 2020-10-25 MED ORDER — ACETAMINOPHEN 325 MG PO TABS
ORAL_TABLET | ORAL | Status: AC
  Filled 2020-10-25: qty 2

## 2020-10-25 MED ORDER — HEPARIN SOD (PORK) LOCK FLUSH 100 UNIT/ML IV SOLN
500.0000 [IU] | Freq: Once | INTRAVENOUS | Status: AC | PRN
  Administered 2020-10-25: 500 [IU]
  Filled 2020-10-25: qty 5

## 2020-10-25 MED ORDER — SODIUM CHLORIDE 0.9 % IV SOLN
10.0000 mg | Freq: Once | INTRAVENOUS | Status: AC
  Administered 2020-10-25: 10 mg via INTRAVENOUS
  Filled 2020-10-25: qty 10

## 2020-10-25 MED ORDER — PALONOSETRON HCL INJECTION 0.25 MG/5ML
0.2500 mg | Freq: Once | INTRAVENOUS | Status: AC
Start: 2020-10-25 — End: 2020-10-25
  Administered 2020-10-25: 0.25 mg via INTRAVENOUS

## 2020-10-25 MED ORDER — TRASTUZUMAB-DKST CHEMO 150 MG IV SOLR
6.0000 mg/kg | Freq: Once | INTRAVENOUS | Status: AC
  Administered 2020-10-25: 546 mg via INTRAVENOUS
  Filled 2020-10-25: qty 26

## 2020-10-25 MED ORDER — DIPHENHYDRAMINE HCL 25 MG PO CAPS
50.0000 mg | ORAL_CAPSULE | Freq: Once | ORAL | Status: AC
  Administered 2020-10-25: 50 mg via ORAL

## 2020-10-25 MED ORDER — SODIUM CHLORIDE 0.9% FLUSH
10.0000 mL | INTRAVENOUS | Status: AC | PRN
  Administered 2020-10-25: 10 mL
  Filled 2020-10-25: qty 10

## 2020-10-25 NOTE — Patient Instructions (Signed)
[facility]  Discharge Instructions: Thank you for choosing [facility] to provide your oncology and hematology care.   If you have a lab appointment with the [facility], please go directly to the [facility] and check in at the registration area.   Wear comfortable clothing and clothing appropriate for easy access to any Portacath or PICC line.   We strive to give you quality time with your provider. You may need to reschedule your appointment if you arrive late (15 or more minutes).  Arriving late affects you and other patients whose appointments are after yours.  Also, if you miss three or more appointments without notifying the office, you may be dismissed from the clinic at the provider's discretion.      For prescription refill requests, have your pharmacy contact our office and allow 72 hours for refills to be completed.    Today you received the following chemotherapy and/or immunotherapy agents: Trastuzumab, Pertuzumab, Taxotere, Carboplatin     To help prevent nausea and vomiting after your treatment, we encourage you to take your nausea medication as directed.  BELOW ARE SYMPTOMS THAT SHOULD BE REPORTED IMMEDIATELY: *FEVER GREATER THAN 100.4 F (38 C) OR HIGHER *CHILLS OR SWEATING *NAUSEA AND VOMITING THAT IS NOT CONTROLLED WITH YOUR NAUSEA MEDICATION *UNUSUAL SHORTNESS OF BREATH *UNUSUAL BRUISING OR BLEEDING *URINARY PROBLEMS (pain or burning when urinating, or frequent urination) *BOWEL PROBLEMS (unusual diarrhea, constipation, pain near the anus) TENDERNESS IN MOUTH AND THROAT WITH OR WITHOUT PRESENCE OF ULCERS (sore throat, sores in mouth, or a toothache) UNUSUAL RASH, SWELLING OR PAIN  UNUSUAL VAGINAL DISCHARGE OR ITCHING   Items with * indicate a potential emergency and should be followed up as soon as possible or go to the Emergency Department if any problems should occur.  Please show the CHEMOTHERAPY ALERT CARD or  IMMUNOTHERAPY ALERT CARD at check-in to the Emergency Department and triage nurse.  Should you have questions after your visit or need to cancel or reschedule your appointment, please contact [facility]  Dept: [phone]  and follow the prompts.  Office hours are 8:00 a.m. to 4:30 p.m. Monday - Friday. Please note that voicemails left after 4:00 p.m. may not be returned until the following business day.  We are closed weekends and major holidays. You have access to a nurse at all times for urgent questions. Please call the main number to the clinic Dept: [phone] and follow the prompts.   For any non-urgent questions, you may also contact your provider using MyChart. We now offer e-Visits for anyone 26 and older to request care online for non-urgent symptoms. For details visit [email or web address].   Also download the MyChart app! Go to the app store, search "MyChart", open the app, select [facility], and log in with your MyChart username and password.  Due to Covid, a mask is required upon entering the hospital/clinic. If you do not have a mask, one will be given to you upon arrival. For doctor visits, patients may have 1 support person aged 51 or older with them. For treatment visits, patients cannot have anyone with them due to current Covid guidelines and our immunocompromised population.

## 2020-10-25 NOTE — Assessment & Plan Note (Signed)
07/08/2020:Screening mammogram showed right breast calcifications. Diagnostic mammogram showed right breast calcifications spanning 4.1cm and no abnormal right axillary lymph nodes. 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 Genetics done in 2019: Negative  Treatment plan: 1. Neoadjuvant chemotherapy with TCH Perjeta 6 cycles followed by Herceptin Perjeta maintenance versus Kadcyla maintenance (based on response to neoadjuvant chemo) for 1 year 2. Followed by breast conserving surgery if possible with sentinel lymph node study 3. 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 URCCnausea study --------------------------------------------------------------------------------------------------------------------------------------- Current treatment: Cycle5TCHP Chemo toxicities: 1.Severe diarrhea:On 1 day with the last chemo she developed a 14-15 loose stools.  She only took half a tablet of Imodium because she is worried about constipation. 2.mucositis: Wedecreasedthe dosage of Taxotere for cycle 2. I had already reduced carboplatin dose to 700 mg.She now has Magic mouthwash. 3.Bone pain:She is doing better without requiring any tramadol. 4.Skin rash: Maculopapular after each chemo.  It improves with doxycycline.  Labs reviewed. Return to clinic for cycle6chemo We will arrange for breast MRI after cycle 6. I will request for surgery appointment as well as presentation of the tumor board after that. She will likely receive 1 more dose of Herceptin and Perjeta prior to surgery.  We will arrange for that.

## 2020-10-28 ENCOUNTER — Inpatient Hospital Stay: Payer: BC Managed Care – PPO

## 2020-10-28 ENCOUNTER — Other Ambulatory Visit: Payer: Self-pay

## 2020-10-28 ENCOUNTER — Telehealth: Payer: Self-pay | Admitting: *Deleted

## 2020-10-28 VITALS — BP 128/76 | HR 72 | Temp 98.2°F | Resp 18

## 2020-10-28 DIAGNOSIS — Z170 Estrogen receptor positive status [ER+]: Secondary | ICD-10-CM

## 2020-10-28 DIAGNOSIS — C50411 Malignant neoplasm of upper-outer quadrant of right female breast: Secondary | ICD-10-CM

## 2020-10-28 DIAGNOSIS — Z5112 Encounter for antineoplastic immunotherapy: Secondary | ICD-10-CM | POA: Diagnosis not present

## 2020-10-28 MED ORDER — PEGFILGRASTIM-CBQV 6 MG/0.6ML ~~LOC~~ SOSY
6.0000 mg | PREFILLED_SYRINGE | Freq: Once | SUBCUTANEOUS | Status: AC
  Administered 2020-10-28: 6 mg via SUBCUTANEOUS

## 2020-10-28 MED ORDER — PEGFILGRASTIM-CBQV 6 MG/0.6ML ~~LOC~~ SOSY
PREFILLED_SYRINGE | SUBCUTANEOUS | Status: AC
  Filled 2020-10-28: qty 0.6

## 2020-10-28 NOTE — Patient Instructions (Signed)

## 2020-10-28 NOTE — Telephone Encounter (Signed)
Left vm on verified vm with appt date and time to see Dr. [name] on 8/1 at [facility] information provided with questions or needs.

## 2020-10-30 ENCOUNTER — Other Ambulatory Visit: Payer: Self-pay | Admitting: Hematology and Oncology

## 2020-10-30 ENCOUNTER — Ambulatory Visit: Payer: BC Managed Care – PPO

## 2020-10-30 ENCOUNTER — Telehealth: Payer: Self-pay | Admitting: Hematology and Oncology

## 2020-10-30 DIAGNOSIS — C50411 Malignant neoplasm of upper-outer quadrant of right female breast: Secondary | ICD-10-CM

## 2020-10-30 DIAGNOSIS — Z170 Estrogen receptor positive status [ER+]: Secondary | ICD-10-CM

## 2020-10-30 NOTE — Telephone Encounter (Signed)
Scheduled per 7/15 los. Pt will receive an updated appt calendar per next visit

## 2020-11-01 ENCOUNTER — Inpatient Hospital Stay: Payer: BC Managed Care – PPO

## 2020-11-01 ENCOUNTER — Other Ambulatory Visit: Payer: Self-pay

## 2020-11-01 VITALS — BP 114/71 | HR 79 | Temp 98.3°F | Resp 16

## 2020-11-01 DIAGNOSIS — Z5112 Encounter for antineoplastic immunotherapy: Secondary | ICD-10-CM | POA: Diagnosis not present

## 2020-11-01 DIAGNOSIS — Z170 Estrogen receptor positive status [ER+]: Secondary | ICD-10-CM

## 2020-11-01 DIAGNOSIS — C50411 Malignant neoplasm of upper-outer quadrant of right female breast: Secondary | ICD-10-CM

## 2020-11-01 MED ORDER — HEPARIN SOD (PORK) LOCK FLUSH 100 UNIT/ML IV SOLN
500.0000 [IU] | Freq: Once | INTRAVENOUS | Status: AC
  Administered 2020-11-01: 500 [IU] via INTRAVENOUS
  Filled 2020-11-01: qty 5

## 2020-11-01 MED ORDER — SODIUM CHLORIDE 0.9 % IV SOLN
INTRAVENOUS | Status: DC
  Filled 2020-11-01 (×2): qty 250

## 2020-11-01 MED ORDER — SODIUM CHLORIDE 0.9% FLUSH
10.0000 mL | Freq: Once | INTRAVENOUS | Status: AC
  Administered 2020-11-01: 10 mL via INTRAVENOUS
  Filled 2020-11-01: qty 10

## 2020-11-01 NOTE — Patient Instructions (Signed)

## 2020-11-04 ENCOUNTER — Telehealth: Payer: Self-pay

## 2020-11-04 NOTE — Telephone Encounter (Signed)
Notified Ch[facility]ine of prior authorization approval for Lidocaine-Prilocaine Cream. Authorization is approved from 11/04/20 through 02/01/21

## 2020-11-13 ENCOUNTER — Other Ambulatory Visit: Payer: Self-pay | Admitting: General Surgery

## 2020-11-13 DIAGNOSIS — C50411 Malignant neoplasm of upper-outer quadrant of right female breast: Secondary | ICD-10-CM

## 2020-11-13 DIAGNOSIS — Z170 Estrogen receptor positive status [ER+]: Secondary | ICD-10-CM

## 2020-11-13 NOTE — Progress Notes (Signed)
[facility] Follow up:    S[place] Bis, MD [place] [address]   DIAGNOSIS: Cancer Staging Malignant neoplasm of upper-outer quadrant of right breast in female, estrogen receptor positive ([place]) Staging form: Breast, AJCC 8th Edition - Clinical stage from 07/17/2020: Stage IB (cT2, cN0, cM0, G3, ER+, PR+, HER2+) - Unsigned Stage prefix: Initial diagnosis Method of lymph node assessment: Clinical Histologic grading system: 3 grade system   SUMMARY OF ONCOLOGIC HISTORY: Oncology History  Malignant neoplasm of upper-outer quadrant of right breast in female, estrogen receptor positive ([place])  07/08/2020 Initial Diagnosis   Screening mammogram showed right breast calcifications. Diagnostic mammogram showed right breast calcifications spanning 4.1cm and no abnormal right axillary lymph nodes. 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   07/24/2020 - 11/15/2020 Neo-Adjuvant Chemotherapy   Taxotere, Carbo, Herceptin, Perjeta given every three weeks x 6   12/05/2020 -  Chemotherapy   Maintenance Herceptin/Perjeta every three weeks for one year       12/11/2020 Surgery   Right breast lumpectomy [name]):   12/20/2020 Surgery   Reconstruction with Dr. Iran Planas     CURRENT THERAPY: Herceptin/Perjeta  INTERVAL HISTORY: Sandy Goodwin 50 y.o. female returns for evaluation prior to her final cycle of neoadjuvant TCHP.  She saw her surgeon Dr. [name] earlier, has her f/u MRI, and is scheduled for her lumpectomy and sentinel node biopsy on 8/31 and oncoplasty and breast reduction with Dr. Iran Planas on 12/20/2020.  She denies peripheral neuropathy and is overall feeling well.     Patient Active Problem List   Diagnosis Date Noted   Genetic testing 07/17/2020   Malignant neoplasm of upper-outer quadrant of right breast in female, estrogen receptor positive ([place]) 07/12/2020   Change in  bowel habits 06/12/2016    is allergic to codeine and hydrocodone.  MEDICAL HISTORY: Past Medical History:  Diagnosis Date   Arthritis    Breast cancer ([place])    Complication of anesthesia    Itching   Dysrhythmia    Tachycardia   Hypothyroidism     SURGICAL HISTORY: Past Surgical History:  Procedure Laterality Date   ABDOMINAL HYSTERECTOMY     APPENDECTOMY  2008   Bowel Rupture Repair  2008   Ruptured during appendectomy   Breast Biopsy Right 1999   COLONOSCOPY N/A 06/15/2016   Procedure: COLONOSCOPY;  Surgeon: Rogene Houston, MD;  [facility];  Service: Endoscopy;  Laterality: N/A;  845   IBF  [id number]   PORTACATH PLACEMENT Left 07/23/2020   Procedure: INSERTION PORT-A-CATH;  Surgeon: Rolm Bookbinder, MD;  Location: [facility];  Service: General;  Laterality: Left;   THYROIDECTOMY, PARTIAL Bilateral 2012    SOCIAL HISTORY: Social History   Socioeconomic History   Marital status: Married    Spouse name: Not on file   Number of children: Not on file   Years of education: Not on file   Highest education level: Not on file  Occupational History   Not on file  Tobacco Use   Smoking status: Never   Smokeless tobacco: Never  Vaping Use   Vaping Use: Never used  Substance and Sexual Activity   Alcohol use: No   Drug use: No   Sexual activity: Yes  Other Topics Concern   Not on file  Social History Narrative   Not on file   Social Determinants of Health  Financial Resource Strain: Not on file  Food Insecurity: Not on file  Transportation Needs: Not on file  Physical Activity: Not on file  Stress: Not on file  Social Connections: Not on file  Intimate Partner Violence: Not on file    FAMILY HISTORY: Family History  Problem Relation Age of Onset   Hypertension Mother    Breast cancer Mother    Lymphoma Mother    Hypertension Brother    Breast cancer Maternal Aunt    Lymphoma Maternal Grandfather    Breast cancer  Maternal Aunt     Review of Systems  Constitutional:  Positive for fatigue. Negative for appetite change, chills, fever and unexpected weight change.  HENT:   Negative for hearing loss, lump/mass and trouble swallowing.   Eyes:  Negative for eye problems and icterus.  Respiratory:  Negative for chest tightness, cough and shortness of breath.   Cardiovascular:  Negative for chest pain, leg swelling and palpitations.  Gastrointestinal:  Negative for abdominal distention, abdominal pain, constipation, diarrhea, nausea and vomiting.  Endocrine: Negative for hot flashes.  Genitourinary:  Negative for difficulty urinating.   Musculoskeletal:  Negative for arthralgias.  Skin:  Negative for itching and rash.  Neurological:  Negative for dizziness, extremity weakness, headaches and numbness.  Hematological:  Negative for adenopathy. Does not bruise/bleed easily.  Psychiatric/Behavioral:  Negative for depression. The patient is not nervous/anxious.      PHYSICAL EXAMINATION  ECOG PERFORMANCE STATUS: 1 - Symptomatic but completely ambulatory  Vitals:   11/15/20 0929  BP: 129/76  Pulse: 67  Resp: 18  Temp: (!) 97.2 F (36.2 C)  SpO2: 98%    Physical Exam Constitutional:      General: She is not in acute distress.    Appearance: Normal appearance. She is not toxic-appearing.  HENT:     Head: Normocephalic and atraumatic.  Eyes:     General: No scleral icterus. Cardiovascular:     Rate and Rhythm: Normal rate and regular rhythm.     Pulses: Normal pulses.     Heart sounds: Normal heart sounds.  Pulmonary:     Effort: Pulmonary effort is normal.     Breath sounds: Normal breath sounds.  Abdominal:     General: Abdomen is flat. Bowel sounds are normal. There is no distension.     Palpations: Abdomen is soft.     Tenderness: There is no abdominal tenderness.  Musculoskeletal:        General: No swelling.     Cervical back: Neck supple.  Lymphadenopathy:     Cervical: No  cervical adenopathy.  Skin:    General: Skin is warm and dry.     Findings: No rash.  Neurological:     General: No focal deficit present.     Mental Status: She is alert.  Psychiatric:        Mood and Affect: Mood normal.        Behavior: Behavior normal.    LABORATORY DATA:  CBC    Component Value Date/Time   WBC 4.1 11/15/2020 0846   WBC 7.9 07/31/2020 1315   RBC 3.11 (L) 11/15/2020 0846   HGB 10.4 (L) 11/15/2020 0846   HCT 30.5 (L) 11/15/2020 0846   PLT 227 11/15/2020 0846   MCV 98.1 11/15/2020 0846   MCH 33.4 11/15/2020 0846   MCHC 34.1 11/15/2020 0846   RDW 16.9 (H) 11/15/2020 0846   LYMPHSABS 2.3 11/15/2020 0846   MONOABS 0.6 11/15/2020 0846   EOSABS 0.0  11/15/2020 0846   BASOSABS 0.0 11/15/2020 0846    CMP     Component Value Date/Time   NA 141 11/15/2020 0846   K 3.7 11/15/2020 0846   CL 107 11/15/2020 0846   CO2 25 11/15/2020 0846   GLUCOSE 91 11/15/2020 0846   BUN 17 11/15/2020 0846   CREATININE 0.73 11/15/2020 0846   CALCIUM 9.0 11/15/2020 0846   PROT 6.8 11/15/2020 0846   ALBUMIN 3.9 11/15/2020 0846   AST 22 11/15/2020 0846   ALT 32 11/15/2020 0846   ALKPHOS 103 11/15/2020 0846   BILITOT 0.3 11/15/2020 0846   GFRNONAA >60 11/15/2020 0846       PENDING LABS:   RADIOGRAPHIC STUDIES:  No results found.   PATHOLOGY:     ASSESSMENT and THERAPY PLAN:   Malignant neoplasm of upper-outer quadrant of right breast in female, estrogen receptor positive ([place]) 07/08/2020: Screening mammogram showed right breast calcifications. Diagnostic mammogram showed right breast calcifications spanning 4.1cm and no abnormal right axillary lymph nodes. 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 Genetics done in 2019: Negative   Treatment plan: 1. Neoadjuvant chemotherapy with TCH Perjeta 6 cycles followed by Herceptin Perjeta maintenance versus Kadcyla maintenance (based on  response to neoadjuvant chemo) for 1 year 2. Followed by breast conserving surgery if possible with sentinel lymph node study 3. 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 URCC nausea study --------------------------------------------------------------------------------------------------------------------------------------- Current treatment: Cycle 6 TCHP Chemo toxicities: 1.  Severe diarrhea: managed with imodium and IV fluids. 2. mucositis: resolved 3.  Bone pain: She is doing better without requiring any tramadol. 4.  Skin rash: Maculopapular after each chemo.  It improves with doxycycline.  She will undergo breast MRI tomorrow, has surgery scheduled 8/31 with Dr. [name].    She will return on 12/05/2020 for herceptin/perjeta only.  She will tentatively receive IV fluids on the Friday after Herceptin/Perjeta.  She will see Dr. Lindi Adie the week of 9/5 to review her surgery results.  She knows to call for any questions that may arise between now and her next appointment.  We are happy to see her sooner if needed.     All questions were answered. The patient knows to call the clinic with any problems, questions or concerns. We can certainly see the patient much sooner if necessary.  Total encounter time: 20 minutes in face to face visit time, chart review, lab review, order entry, care coordination, and documentation of the encounter.    Wilber Bihari, NP 11/15/20 12:55 PM Medical Oncology and Hematology [facility] [place], [place] [address] Tel. [phone]    Fax. [phone]  *Total Encounter Time as defined by the Centers for Medicare and Medicaid Services includes, in addition to the face-to-face time of a patient visit (documented in the note above) non-face-to-face time: obtaining and reviewing outside history, ordering and reviewing  medications, tests or procedures, care coordination (communications with other health care professionals or caregivers) and documentation in the medical record.

## 2020-11-13 NOTE — Assessment & Plan Note (Addendum)
07/08/2020:Screening mammogram showed right breast calcifications. Diagnostic mammogram showed right breast calcifications spanning 4.1cm and no abnormal right axillary lymph nodes. 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 Genetics done in 2019: Negative  Treatment plan: 1. Neoadjuvant chemotherapy with TCH Perjeta 6 cycles followed by Herceptin Perjeta maintenance versus Kadcyla maintenance (based on response to neoadjuvant chemo) for 1 year 2. Followed by breast conserving surgery if possible with sentinel lymph node study 3. 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 URCCnausea study --------------------------------------------------------------------------------------------------------------------------------------- Current treatment: Cycle6TCHP Chemo toxicities: 1.Severe diarrhea:managed with imodium and IV fluids. 2.mucositis: resolved 3.Bone pain:She is doing better without requiring any tramadol. 4.Skin rash: Maculopapular after each chemo.  It improves with doxycycline.  She will undergo breast MRI tomorrow, has surgery scheduled 8/31 with Dr. [name].    She will return on 12/05/2020 for herceptin/perjeta only.  She will tentatively receive IV fluids on the Friday after Herceptin/Perjeta.  She will see Dr. Lindi Adie the week of 9/5 to review her surgery results.  She knows to call for any questions that may arise between now and her next appointment.  We are happy to see her sooner if needed.

## 2020-11-14 ENCOUNTER — Other Ambulatory Visit: Payer: Self-pay | Admitting: *Deleted

## 2020-11-14 ENCOUNTER — Other Ambulatory Visit: Payer: Self-pay | Admitting: General Surgery

## 2020-11-14 ENCOUNTER — Other Ambulatory Visit: Payer: Self-pay | Admitting: Hematology and Oncology

## 2020-11-14 DIAGNOSIS — Z170 Estrogen receptor positive status [ER+]: Secondary | ICD-10-CM

## 2020-11-14 DIAGNOSIS — C50411 Malignant neoplasm of upper-outer quadrant of right female breast: Secondary | ICD-10-CM

## 2020-11-15 ENCOUNTER — Encounter: Payer: Self-pay | Admitting: Adult Health

## 2020-11-15 ENCOUNTER — Other Ambulatory Visit: Payer: Self-pay

## 2020-11-15 ENCOUNTER — Inpatient Hospital Stay: Payer: BC Managed Care – PPO | Attending: Hematology and Oncology

## 2020-11-15 ENCOUNTER — Encounter: Payer: Self-pay | Admitting: *Deleted

## 2020-11-15 ENCOUNTER — Inpatient Hospital Stay: Payer: BC Managed Care – PPO

## 2020-11-15 ENCOUNTER — Inpatient Hospital Stay (HOSPITAL_BASED_OUTPATIENT_CLINIC_OR_DEPARTMENT_OTHER): Payer: BC Managed Care – PPO | Admitting: Adult Health

## 2020-11-15 ENCOUNTER — Ambulatory Visit: Payer: BC Managed Care – PPO

## 2020-11-15 VITALS — BP 129/76 | HR 67 | Temp 97.2°F | Resp 18 | Wt 204.0 lb

## 2020-11-15 DIAGNOSIS — C50411 Malignant neoplasm of upper-outer quadrant of right female breast: Secondary | ICD-10-CM | POA: Insufficient documentation

## 2020-11-15 DIAGNOSIS — Z298 Encounter for other specified prophylactic measures: Secondary | ICD-10-CM | POA: Diagnosis not present

## 2020-11-15 DIAGNOSIS — R197 Diarrhea, unspecified: Secondary | ICD-10-CM | POA: Diagnosis not present

## 2020-11-15 DIAGNOSIS — Z5112 Encounter for antineoplastic immunotherapy: Secondary | ICD-10-CM | POA: Insufficient documentation

## 2020-11-15 DIAGNOSIS — Z170 Estrogen receptor positive status [ER+]: Secondary | ICD-10-CM

## 2020-11-15 DIAGNOSIS — Z5111 Encounter for antineoplastic chemotherapy: Secondary | ICD-10-CM | POA: Insufficient documentation

## 2020-11-15 DIAGNOSIS — Z9221 Personal history of antineoplastic chemotherapy: Secondary | ICD-10-CM | POA: Insufficient documentation

## 2020-11-15 DIAGNOSIS — Z5189 Encounter for other specified aftercare: Secondary | ICD-10-CM | POA: Insufficient documentation

## 2020-11-15 DIAGNOSIS — Z95828 Presence of other vascular implants and grafts: Secondary | ICD-10-CM

## 2020-11-15 LAB — CBC WITH DIFFERENTIAL (CANCER CENTER ONLY)
Abs Immature Granulocytes: 0.02 10*3/uL (ref 0.00–0.07)
Basophils Absolute: 0 10*3/uL (ref 0.0–0.1)
Basophils Relative: 0 %
Eosinophils Absolute: 0 10*3/uL (ref 0.0–0.5)
Eosinophils Relative: 1 %
HCT: 30.5 % — ABNORMAL LOW (ref 36.0–46.0)
Hemoglobin: 10.4 g/dL — ABNORMAL LOW (ref 12.0–15.0)
Immature Granulocytes: 1 %
Lymphocytes Relative: 54 %
Lymphs Abs: 2.3 10*3/uL (ref 0.7–4.0)
MCH: 33.4 pg (ref 26.0–34.0)
MCHC: 34.1 g/dL (ref 30.0–36.0)
MCV: 98.1 fL (ref 80.0–100.0)
Monocytes Absolute: 0.6 10*3/uL (ref 0.1–1.0)
Monocytes Relative: 15 %
Neutro Abs: 1.2 10*3/uL — ABNORMAL LOW (ref 1.7–7.7)
Neutrophils Relative %: 29 %
Platelet Count: 227 10*3/uL (ref 150–400)
RBC: 3.11 MIL/uL — ABNORMAL LOW (ref 3.87–5.11)
RDW: 16.9 % — ABNORMAL HIGH (ref 11.5–15.5)
WBC Count: 4.1 10*3/uL (ref 4.0–10.5)
nRBC: 0 % (ref 0.0–0.2)

## 2020-11-15 LAB — CMP (CANCER CENTER ONLY)
ALT: 32 U/L (ref 0–44)
AST: 22 U/L (ref 15–41)
Albumin: 3.9 g/dL (ref 3.5–5.0)
Alkaline Phosphatase: 103 U/L (ref 38–126)
Anion gap: 9 (ref 5–15)
BUN: 17 mg/dL (ref 6–20)
CO2: 25 mmol/L (ref 22–32)
Calcium: 9 mg/dL (ref 8.9–10.3)
Chloride: 107 mmol/L (ref 98–111)
Creatinine: 0.73 mg/dL (ref 0.44–1.00)
GFR, Estimated: >60 mL/min (ref >60)
Glucose, Bld: 91 mg/dL (ref 70–99)
Potassium: 3.7 mmol/L (ref 3.5–5.1)
Sodium: 141 mmol/L (ref 135–145)
Total Bilirubin: 0.3 mg/dL (ref 0.3–1.2)
Total Protein: 6.8 g/dL (ref 6.5–8.1)

## 2020-11-15 MED ORDER — SODIUM CHLORIDE 0.9% FLUSH
10.0000 mL | INTRAVENOUS | Status: DC | PRN
  Administered 2020-11-15: 10 mL via INTRAVENOUS
  Filled 2020-11-15: qty 10

## 2020-11-15 MED ORDER — SODIUM CHLORIDE 0.9 % IV SOLN
65.0000 mg/m2 | Freq: Once | INTRAVENOUS | Status: AC
  Administered 2020-11-15: 130 mg via INTRAVENOUS
  Filled 2020-11-15: qty 13

## 2020-11-15 MED ORDER — SODIUM CHLORIDE 0.9 % IV SOLN
700.0000 mg | Freq: Once | INTRAVENOUS | Status: AC
  Administered 2020-11-15: 700 mg via INTRAVENOUS
  Filled 2020-11-15: qty 70

## 2020-11-15 MED ORDER — PALONOSETRON HCL INJECTION 0.25 MG/5ML
0.2500 mg | Freq: Once | INTRAVENOUS | Status: AC
  Administered 2020-11-15: 0.25 mg via INTRAVENOUS

## 2020-11-15 MED ORDER — CILGAVIMAB (PART OF EVUSHELD) INJECTION
300.0000 mg | Freq: Once | INTRAMUSCULAR | Status: AC
  Administered 2020-11-15: 300 mg via INTRAMUSCULAR
  Filled 2020-11-15: qty 3

## 2020-11-15 MED ORDER — SODIUM CHLORIDE 0.9 % IV SOLN
10.0000 mg | Freq: Once | INTRAVENOUS | Status: AC
  Administered 2020-11-15: 10 mg via INTRAVENOUS
  Filled 2020-11-15: qty 10

## 2020-11-15 MED ORDER — SODIUM CHLORIDE 0.9 % IV SOLN
420.0000 mg | Freq: Once | INTRAVENOUS | Status: AC
  Administered 2020-11-15: 420 mg via INTRAVENOUS
  Filled 2020-11-15: qty 14

## 2020-11-15 MED ORDER — DIPHENHYDRAMINE HCL 25 MG PO CAPS
50.0000 mg | ORAL_CAPSULE | Freq: Once | ORAL | Status: AC
  Administered 2020-11-15: 50 mg via ORAL

## 2020-11-15 MED ORDER — ACETAMINOPHEN 325 MG PO TABS
ORAL_TABLET | ORAL | Status: AC
  Filled 2020-11-15: qty 2

## 2020-11-15 MED ORDER — ACETAMINOPHEN 325 MG PO TABS
650.0000 mg | ORAL_TABLET | Freq: Once | ORAL | Status: AC
  Administered 2020-11-15: 650 mg via ORAL

## 2020-11-15 MED ORDER — DIPHENHYDRAMINE HCL 25 MG PO CAPS
ORAL_CAPSULE | ORAL | Status: AC
  Filled 2020-11-15: qty 2

## 2020-11-15 MED ORDER — PALONOSETRON HCL INJECTION 0.25 MG/5ML
INTRAVENOUS | Status: AC
  Filled 2020-11-15: qty 5

## 2020-11-15 MED ORDER — SODIUM CHLORIDE 0.9% FLUSH
10.0000 mL | INTRAVENOUS | Status: DC | PRN
  Administered 2020-11-15: 10 mL
  Filled 2020-11-15: qty 10

## 2020-11-15 MED ORDER — SODIUM CHLORIDE 0.9 % IV SOLN
Freq: Once | INTRAVENOUS | Status: AC
  Filled 2020-11-15: qty 250

## 2020-11-15 MED ORDER — SODIUM CHLORIDE 0.9 % IV SOLN
150.0000 mg | Freq: Once | INTRAVENOUS | Status: AC
  Administered 2020-11-15: 150 mg via INTRAVENOUS
  Filled 2020-11-15: qty 150

## 2020-11-15 MED ORDER — HEPARIN SOD (PORK) LOCK FLUSH 100 UNIT/ML IV SOLN
500.0000 [IU] | Freq: Once | INTRAVENOUS | Status: AC | PRN
  Administered 2020-11-15: 500 [IU]
  Filled 2020-11-15: qty 5

## 2020-11-15 MED ORDER — TRASTUZUMAB-DKST CHEMO 150 MG IV SOLR
6.0000 mg/kg | Freq: Once | INTRAVENOUS | Status: AC
  Administered 2020-11-15: 546 mg via INTRAVENOUS
  Filled 2020-11-15: qty 26

## 2020-11-15 MED ORDER — TIXAGEVIMAB (PART OF EVUSHELD) INJECTION
300.0000 mg | Freq: Once | INTRAMUSCULAR | Status: AC
  Administered 2020-11-15: 300 mg via INTRAMUSCULAR
  Filled 2020-11-15: qty 3

## 2020-11-15 NOTE — Patient Instructions (Addendum)
[facility]  Discharge Instructions: Thank you for choosing [facility] to provide your oncology and hematology care.   If you have a lab appointment with the [facility], please go directly to the [facility] and check in at the registration area.   Wear comfortable clothing and clothing appropriate for easy access to any Portacath or PICC line.   We strive to give you quality time with your provider. You may need to reschedule your appointment if you arrive late (15 or more minutes).  Arriving late affects you and other patients whose appointments are after yours.  Also, if you miss three or more appointments without notifying the office, you may be dismissed from the clinic at the provider's discretion.      For prescription refill requests, have your pharmacy contact our office and allow 72 hours for refills to be completed.    Today you received the following chemotherapy and/or immunotherapy agents: Trastuzumab (OGIVRI), Pertuzumab (Perjeta), Docetaxel (Taxotere), and  Carboplatin.   To help prevent nausea and vomiting after your treatment, we encourage you to take your nausea medication as directed.  BELOW ARE SYMPTOMS THAT SHOULD BE REPORTED IMMEDIATELY: *FEVER GREATER THAN 100.4 F (38 C) OR HIGHER *CHILLS OR SWEATING *NAUSEA AND VOMITING THAT IS NOT CONTROLLED WITH YOUR NAUSEA MEDICATION *UNUSUAL SHORTNESS OF BREATH *UNUSUAL BRUISING OR BLEEDING *URINARY PROBLEMS (pain or burning when urinating, or frequent urination) *BOWEL PROBLEMS (unusual diarrhea, constipation, pain near the anus) TENDERNESS IN MOUTH AND THROAT WITH OR WITHOUT PRESENCE OF ULCERS (sore throat, sores in mouth, or a toothache) UNUSUAL RASH, SWELLING OR PAIN  UNUSUAL VAGINAL DISCHARGE OR ITCHING   Items with * indicate a potential emergency and should be followed up as soon as possible or go to the Emergency Department if any problems should occur.  Please show  the CHEMOTHERAPY ALERT CARD or IMMUNOTHERAPY ALERT CARD at check-in to the Emergency Department and triage nurse.  Should you have questions after your visit or need to cancel or reschedule your appointment, please contact [facility]  Dept: [phone]  and follow the prompts.  Office hours are 8:00 a.m. to 4:30 p.m. Monday - Friday. Please note that voicemails left after 4:00 p.m. may not be returned until the following business day.  We are closed weekends and major holidays. You have access to a nurse at all times for urgent questions. Please call the main number to the clinic Dept: [phone] and follow the prompts.   For any non-urgent questions, you may also contact your provider using MyChart. We now offer e-Visits for anyone 64 and older to request care online for non-urgent symptoms. For details visit [email or web address].   Also download the MyChart app! Go to the app store, search "MyChart", open the app, select [facility], and log in with your MyChart username and password.  Due to Covid, a mask is required upon entering the hospital/clinic. If you do not have a mask, one will be given to you upon arrival. For doctor visits, patients may have 1 support person aged 46 or older with them. For treatment visits, patients cannot have anyone with them due to current Covid guidelines and our immunocompromised population.

## 2020-11-15 NOTE — Progress Notes (Signed)
I connected by phone with Sandy Goodwin on 11/15/2020, 10:04 AM to discuss the potential use of a new treatment, tixagevimab/cilgavimab, for pre-exposure prophylaxis for prevention of coronavirus disease 2019 (COVID-19) caused by the SARS-CoV-2 virus.  This patient is a 50 y.o. female that meets the FDA criteria for Emergency Use Authorization of tixagevimab/cilgavimab for pre-exposure prophylaxis of COVID-19 disease. Pt meets following criteria: Age >12 yr and weight > 40kg Not currently infected with SARS-CoV-2 and has no known recent exposure to an individual infected with SARS-CoV-2 AND Who has moderate to severe immune compromise due to a medical condition or receipt of immunosuppressive medications or treatments and may not mount an adequate immune response to COVID-19 vaccination or  Vaccination with any available COVID-19 vaccine, according to the approved or authorized schedule, is not recommended due to a history of severe adverse reaction (e.g., severe allergic reaction) to a COVID-19 vaccine(s) and/or COVID-19 vaccine component(s).  Patient meets the following definition of mod-severe immune compromised status: 7. Solid tumor malignancies on immunomodulatory chemotherapy or advanced AIDS   I have spoken and communicated the following to the patient or parent/caregiver regarding COVID monoclonal antibody treatment:  FDA has authorized the emergency use of tixagevimab/cilgavimab for the pre-exposure prophylaxis of COVID-19 in patients with moderate-severe immunocompromised status, who meet above EUA criteria.  The significant known and potential risks and benefits of COVID monoclonal antibody, and the extent to which such potential risks and benefits are unknown.  Information on available alternative treatments and the risks and benefits of those alternatives, including clinical trials.  The patient or parent/caregiver has the option to accept or refuse COVID monoclonal antibody  treatment.  After reviewing this information with the patient, agree to receive tixagevimab/cilgavimab.  Will receive today.    Scot Dock, NP, 11/15/2020, 10:04 AM

## 2020-11-15 NOTE — Progress Notes (Signed)
Per Mendel Ryder, okay to treat with ANC 1.2

## 2020-11-16 ENCOUNTER — Ambulatory Visit
Admission: RE | Admit: 2020-11-16 | Discharge: 2020-11-16 | Disposition: A | Discharge status: Home / Self Care | Payer: BC Managed Care – PPO | Source: Ambulatory Visit | Attending: Hematology and Oncology | Admitting: Hematology and Oncology

## 2020-11-16 DIAGNOSIS — C50411 Malignant neoplasm of upper-outer quadrant of right female breast: Secondary | ICD-10-CM

## 2020-11-16 IMAGING — MR MR BREAST BILAT WO/W CM
8 of 12 series · 33 of 48 positions shown · IV contrast (gadavist)
Comparison: Prior films

CLINICAL DATA: Personal history of right breast cancer assess for
chemotherapy response.

LABS:  Does not apply
EXAM:
BILATERAL BREAST MRI WITH AND WITHOUT CONTRAST
TECHNIQUE: Multiplanar, multisequence MR images of both breasts were obtained
prior to and following the intravenous administration of 9 mL ml of
Gadavist

[Series 2: t2_tirm_tra ipat (a-p) · axial · 3.0mm · 0.70mm/px · 1 of 55 slices shown]
[im 1/55]
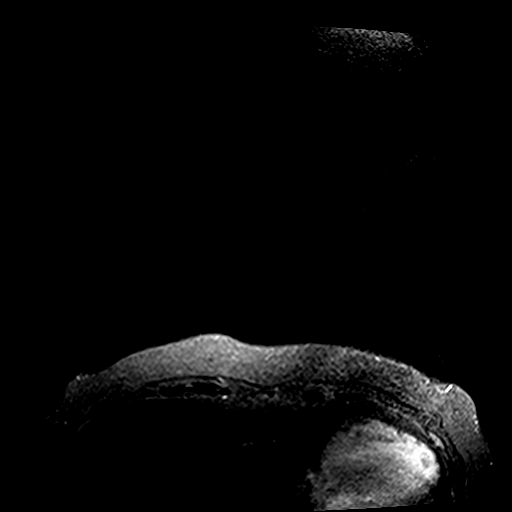

[Series 3: fl3d pre-cm no · axial · non-contrast · 1.2mm · 0.94mm/px · z∈[-100,+72]mm · 5 of 144 slices shown]
[im 1/144]
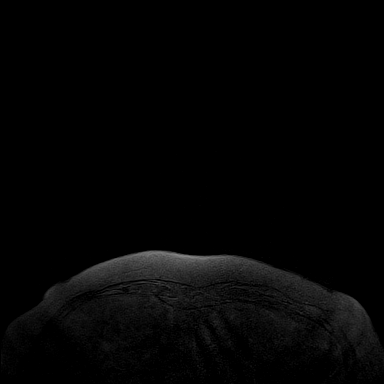
[im 36/144]
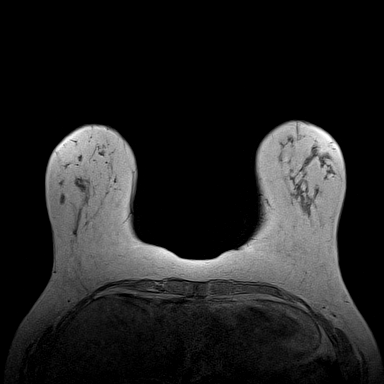
[im 72/144]
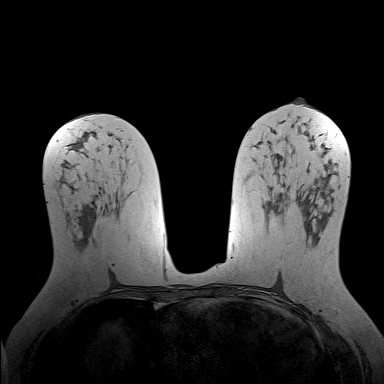
[im 108/144]
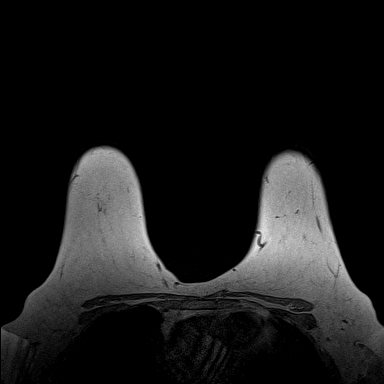
[im 144/144]
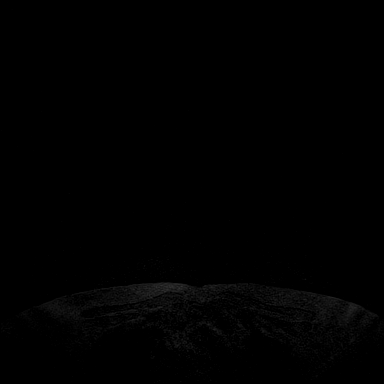

[Series 4: fl3d pre-cm · axial · non-contrast · 1.2mm · 0.94mm/px · z∈[-100,+72]mm · 5 of 144 slices shown]
[im 1/144]
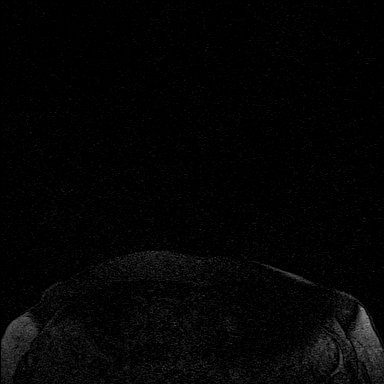
[im 36/144]
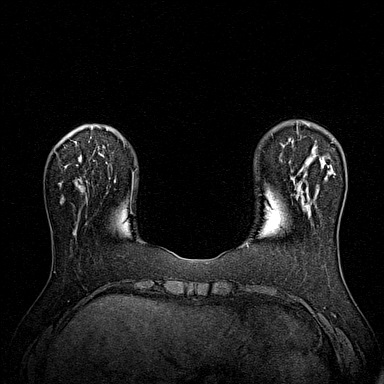
[im 72/144]
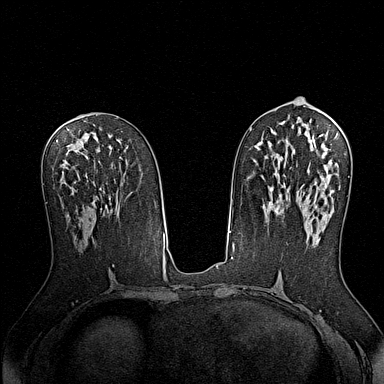
[im 108/144]
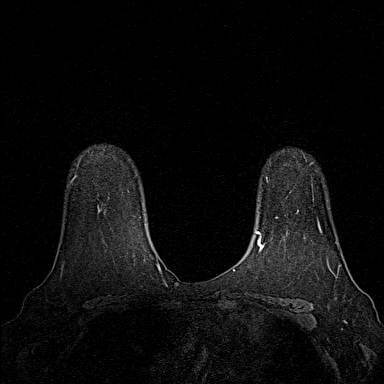
[im 144/144]
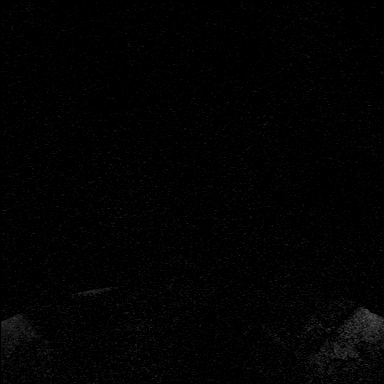

[Series 5: fl3d post-cm 20 · axial · 1.2mm · 0.94mm/px · z∈[-100,+72]mm · 5 of 144 slices shown (1 of 3)]
[im 1/144]
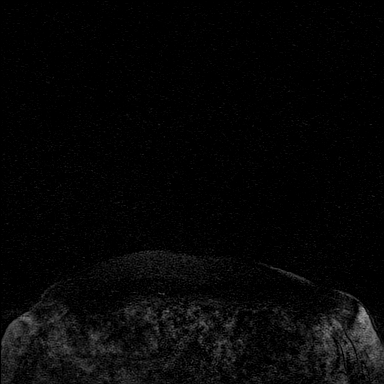
[im 36/144]
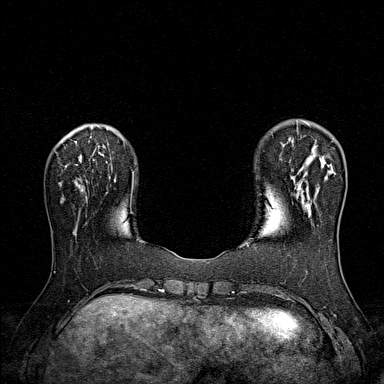
[im 72/144]
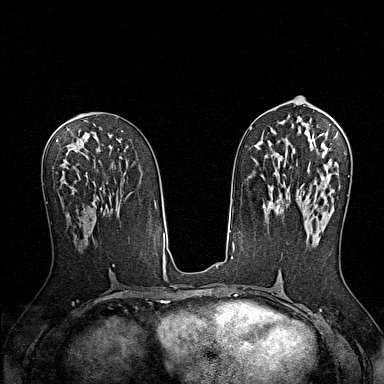
[im 108/144]
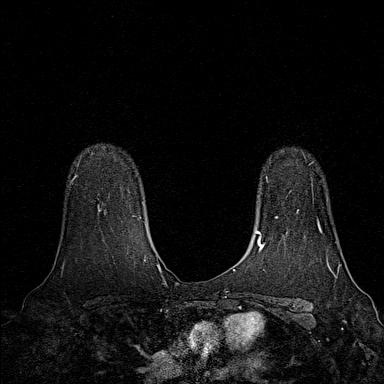
[im 144/144]
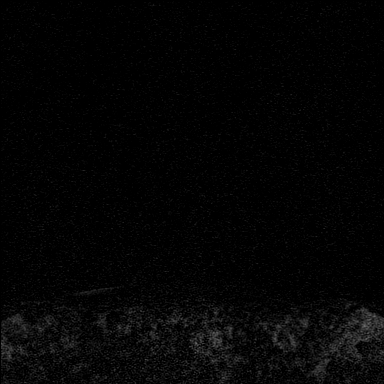

[Series 6: fl3d post-cm 20 · axial · 1.2mm · 0.94mm/px · z∈[-100,+72]mm · 5 of 144 slices shown (2 of 3)]
[im 1/144]
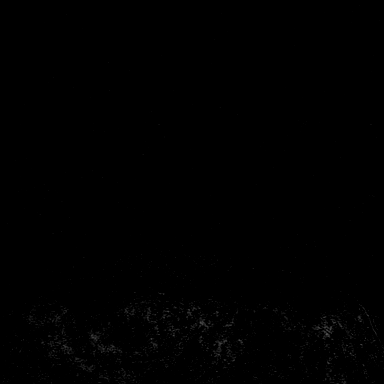
[im 36/144]
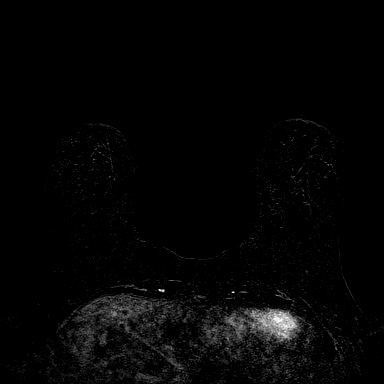
[im 72/144]
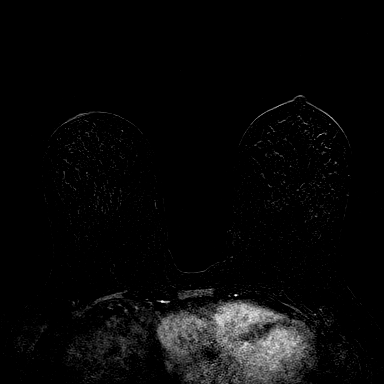
[im 108/144]
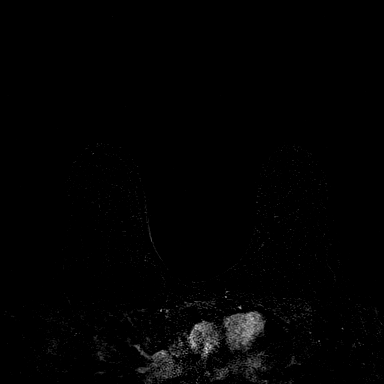
[im 144/144]
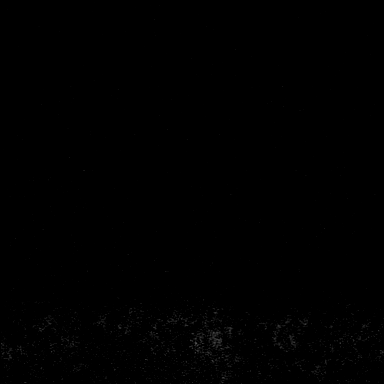

[Series 7: fl3d post-cm 20 · axial · 172.8mm · 0.94mm/px · 1 of 1 slices shown (3 of 3)]
[im 1/1]
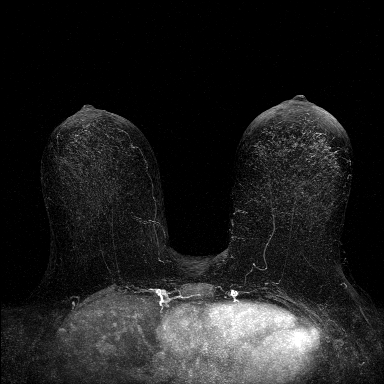

[Series 8: fl3d post-cm 3min · axial · 1.2mm · 0.94mm/px · z∈[-100,+72]mm · 6 of 144 slices shown]
[im 1/144]
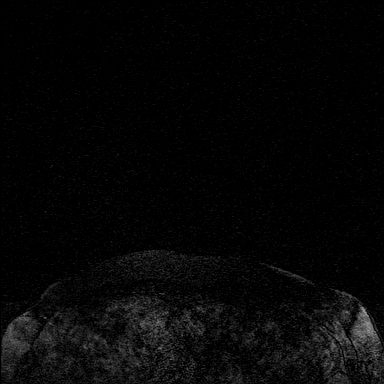
[im 29/144]
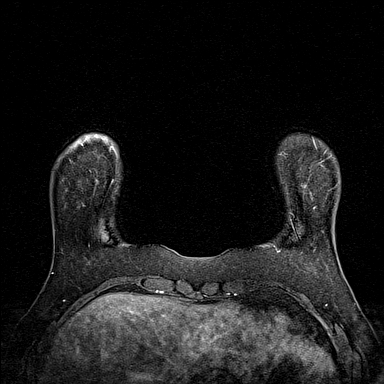
[im 58/144]
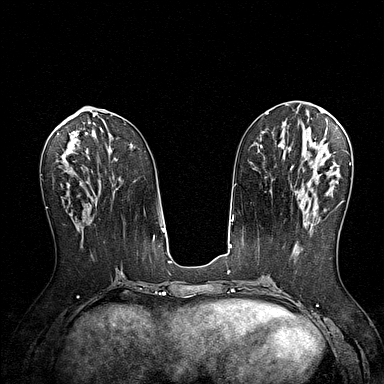
[im 86/144]
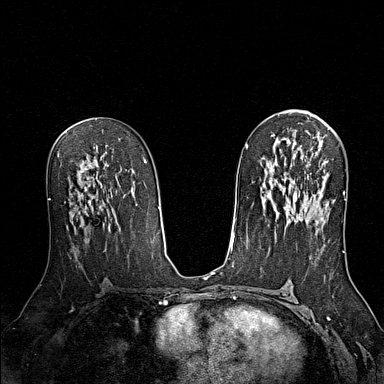
[im 115/144]
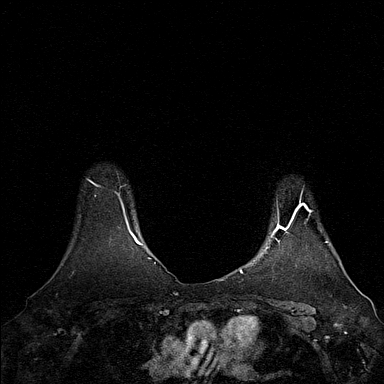
[im 144/144]
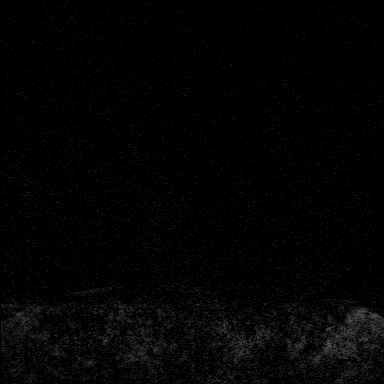

[Series 9: fl3d post-cm 3min_sub · axial · 1.2mm · 0.94mm/px · z∈[-100,+37]mm · 5 of 144 slices shown]
[im 1/144]
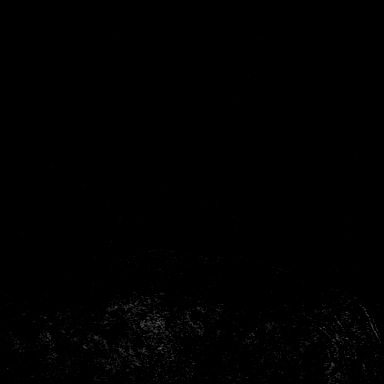
[im 29/144]
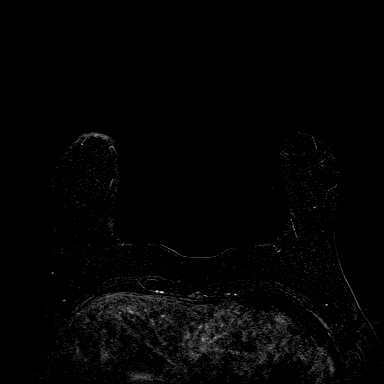
[im 58/144]
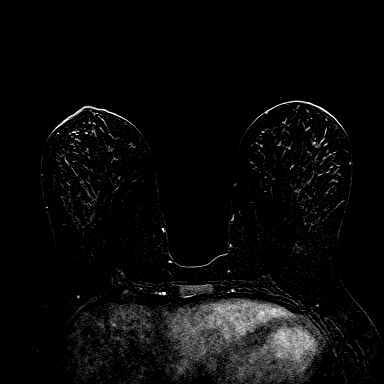
[im 86/144]
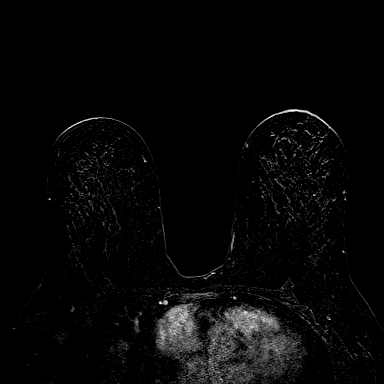
[im 115/144]
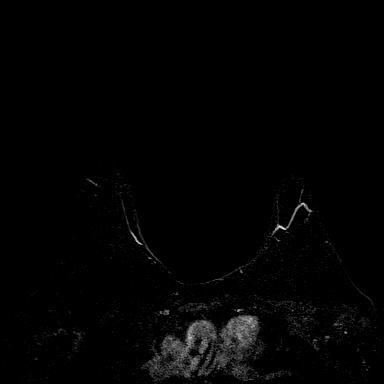

[33 of 48 positions shown; findings below may reference images not displayed]

Three-dimensional MR images were rendered by post-processing of the
original MR data on an independent workstation. The
three-dimensional MR images were interpreted, and findings are
reported in the following complete MRI report for this study. Three
dimensional images were evaluated at the independent interpreting
workstation using the DynaCAD thin client.
FINDINGS: Breast composition: c. Heterogeneous fibroglandular tissue.

Background parenchymal enhancement: Moderate.

Right breast: No mass or abnormal enhancement. No enhancement is
identified in the previously biopsy proven right breast cancer area.

Left breast: There is focal spiculated enhancement in the lower left
breast 6 o'clock mid depth measuring 1.3 x 1.2 cm (series 9, image
105).

Lymph nodes: No abnormal appearing lymph nodes.

Ancillary findings:  None.
IMPRESSION: Suspicious findings.

The previously noted enhancement in the right breast cancer area is
resolved post treatment.

There is focal spiculated enhancement in the left breast 6 o'clock.

RECOMMENDATION:
Recommend MRI guided core biopsy of left breast mass.

BI-RADS CATEGORY  4: Suspicious.

## 2020-11-16 MED ORDER — GADOBUTROL 1 MMOL/ML IV SOLN
9.0000 mL | Freq: Once | INTRAVENOUS | Status: AC | PRN
  Administered 2020-11-16: 9 mL via INTRAVENOUS

## 2020-11-18 ENCOUNTER — Other Ambulatory Visit: Payer: Self-pay | Admitting: Hematology and Oncology

## 2020-11-18 ENCOUNTER — Telehealth: Payer: Self-pay | Admitting: *Deleted

## 2020-11-18 ENCOUNTER — Other Ambulatory Visit: Payer: Self-pay

## 2020-11-18 ENCOUNTER — Inpatient Hospital Stay: Payer: BC Managed Care – PPO

## 2020-11-18 ENCOUNTER — Encounter: Payer: Self-pay | Admitting: *Deleted

## 2020-11-18 ENCOUNTER — Telehealth: Payer: Self-pay | Admitting: Hematology and Oncology

## 2020-11-18 VITALS — BP 142/79 | HR 80 | Temp 98.9°F | Resp 16

## 2020-11-18 DIAGNOSIS — Z5112 Encounter for antineoplastic immunotherapy: Secondary | ICD-10-CM | POA: Diagnosis not present

## 2020-11-18 DIAGNOSIS — R928 Other abnormal and inconclusive findings on diagnostic imaging of breast: Secondary | ICD-10-CM

## 2020-11-18 DIAGNOSIS — C50411 Malignant neoplasm of upper-outer quadrant of right female breast: Secondary | ICD-10-CM

## 2020-11-18 MED ORDER — PEGFILGRASTIM-CBQV 6 MG/0.6ML ~~LOC~~ SOSY
PREFILLED_SYRINGE | SUBCUTANEOUS | Status: AC
  Filled 2020-11-18: qty 0.6

## 2020-11-18 MED ORDER — PEGFILGRASTIM-CBQV 6 MG/0.6ML ~~LOC~~ SOSY
6.0000 mg | PREFILLED_SYRINGE | Freq: Once | SUBCUTANEOUS | Status: AC
  Administered 2020-11-18: 6 mg via SUBCUTANEOUS

## 2020-11-18 NOTE — Telephone Encounter (Signed)
Scheduled per 8/5 los. Pt will receive an updated appt calendar per next visit appt notes

## 2020-11-18 NOTE — Patient Instructions (Signed)

## 2020-11-18 NOTE — Telephone Encounter (Signed)
Called pt with MRI results and recommendations for MRI bx to left breast. Received verbal understanding. Informed will receive call with an appt.  Encourage pt to call with further questions or needs.

## 2020-11-20 ENCOUNTER — Ambulatory Visit: Payer: BC Managed Care – PPO

## 2020-11-21 ENCOUNTER — Other Ambulatory Visit: Payer: Self-pay

## 2020-11-21 ENCOUNTER — Other Ambulatory Visit: Payer: Self-pay | Admitting: Diagnostic Radiology

## 2020-11-21 ENCOUNTER — Ambulatory Visit
Admission: RE | Admit: 2020-11-21 | Discharge: 2020-11-21 | Disposition: A | Discharge status: Home / Self Care | Payer: BC Managed Care – PPO | Source: Ambulatory Visit | Attending: Hematology and Oncology | Admitting: Hematology and Oncology

## 2020-11-21 DIAGNOSIS — R928 Other abnormal and inconclusive findings on diagnostic imaging of breast: Secondary | ICD-10-CM

## 2020-11-21 IMAGING — MG MM BREAST LOCALIZATION CLIP
4 series · 4 of 12 positions shown · non-contrast
Comparison: Previous exam(s).

CLINICAL DATA: Status post MRI guided biopsy for non-mass
enhancement within the lower LEFT breast.

EXAM:
3D DIAGNOSTIC LEFT MAMMOGRAM POST MRI BIOPSY

[L CC synth-2D]
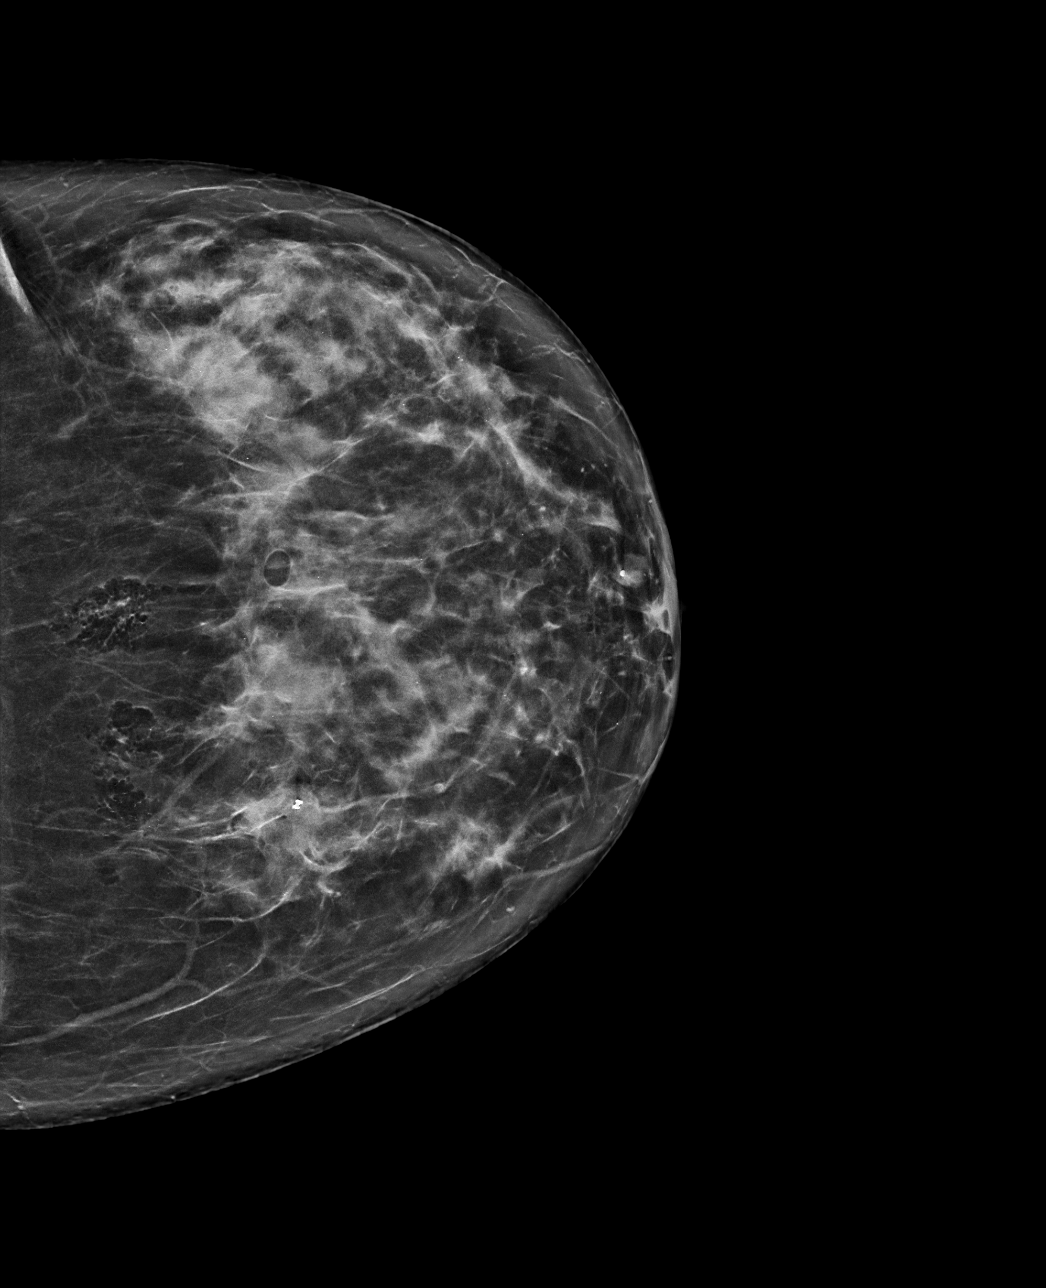

[L ML synth-2D]
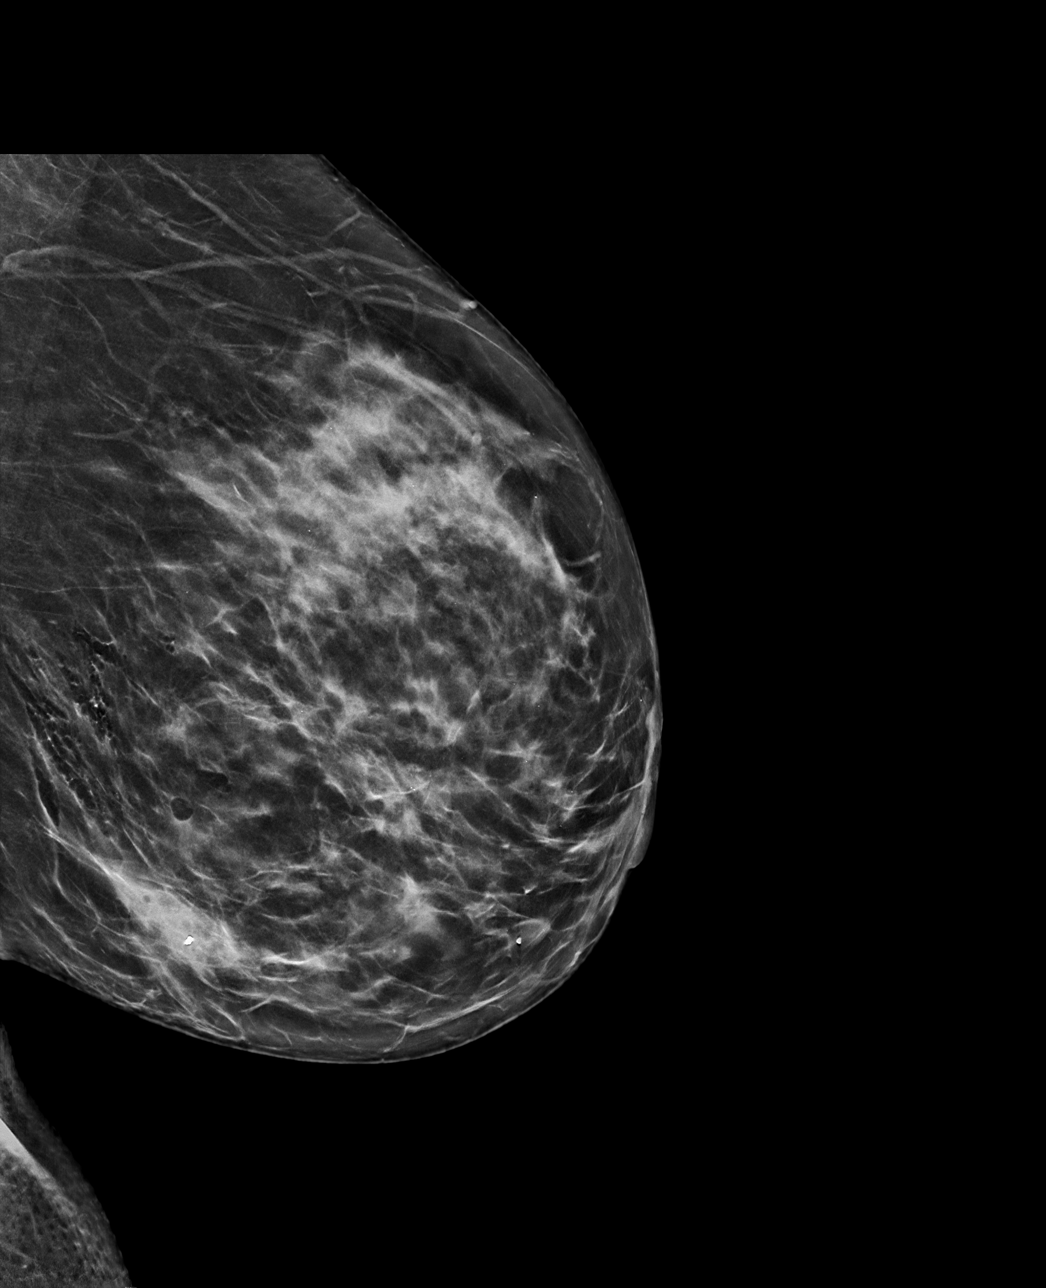

[L CC tomo · tomo slice 35/70.0]
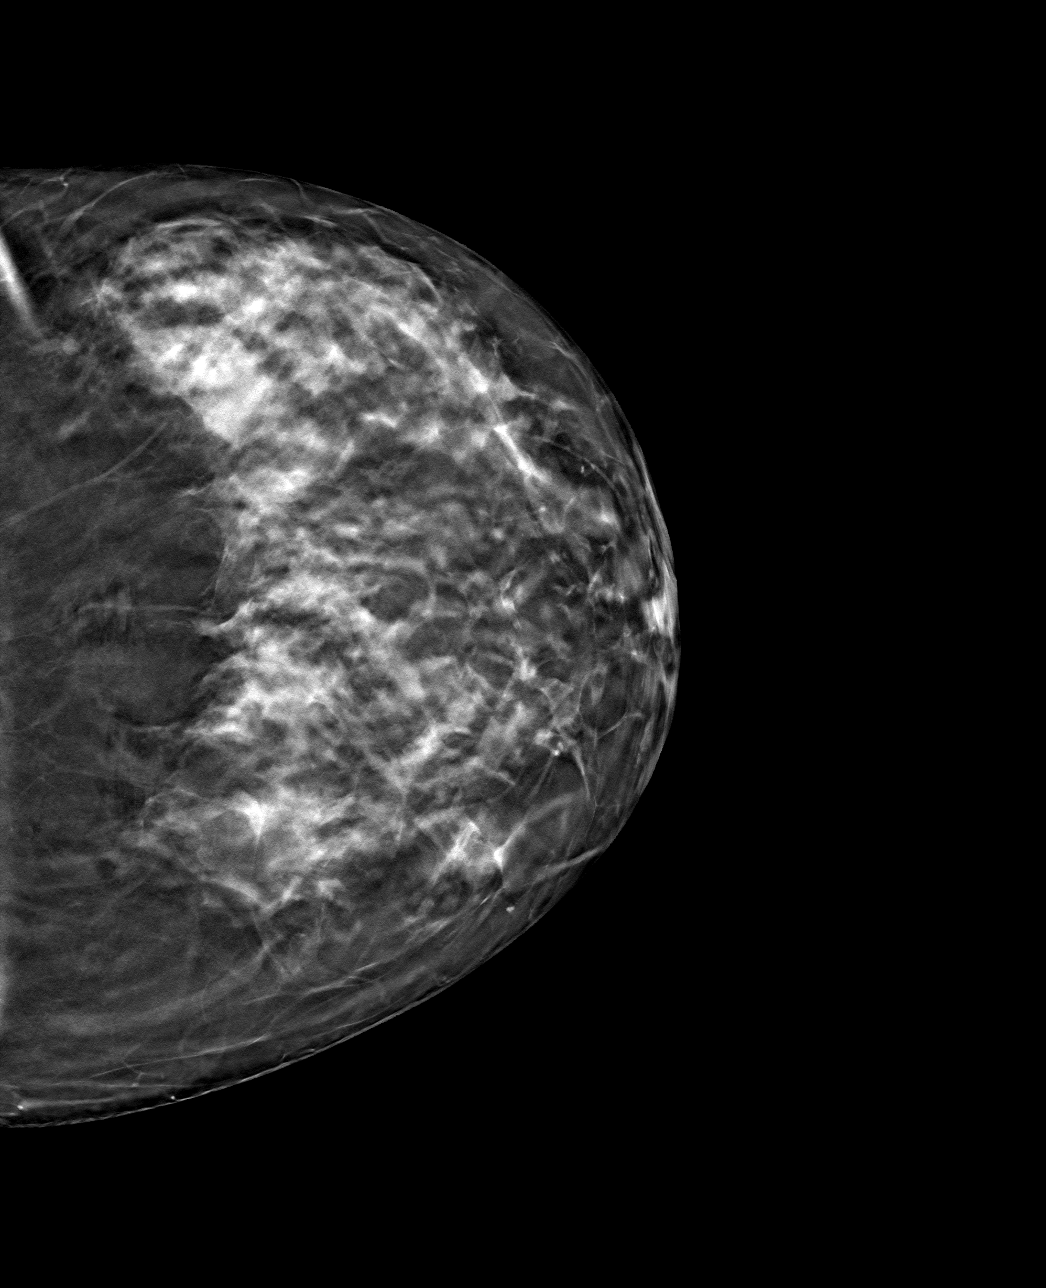

[L ML tomo · tomo slice 37/72.0]
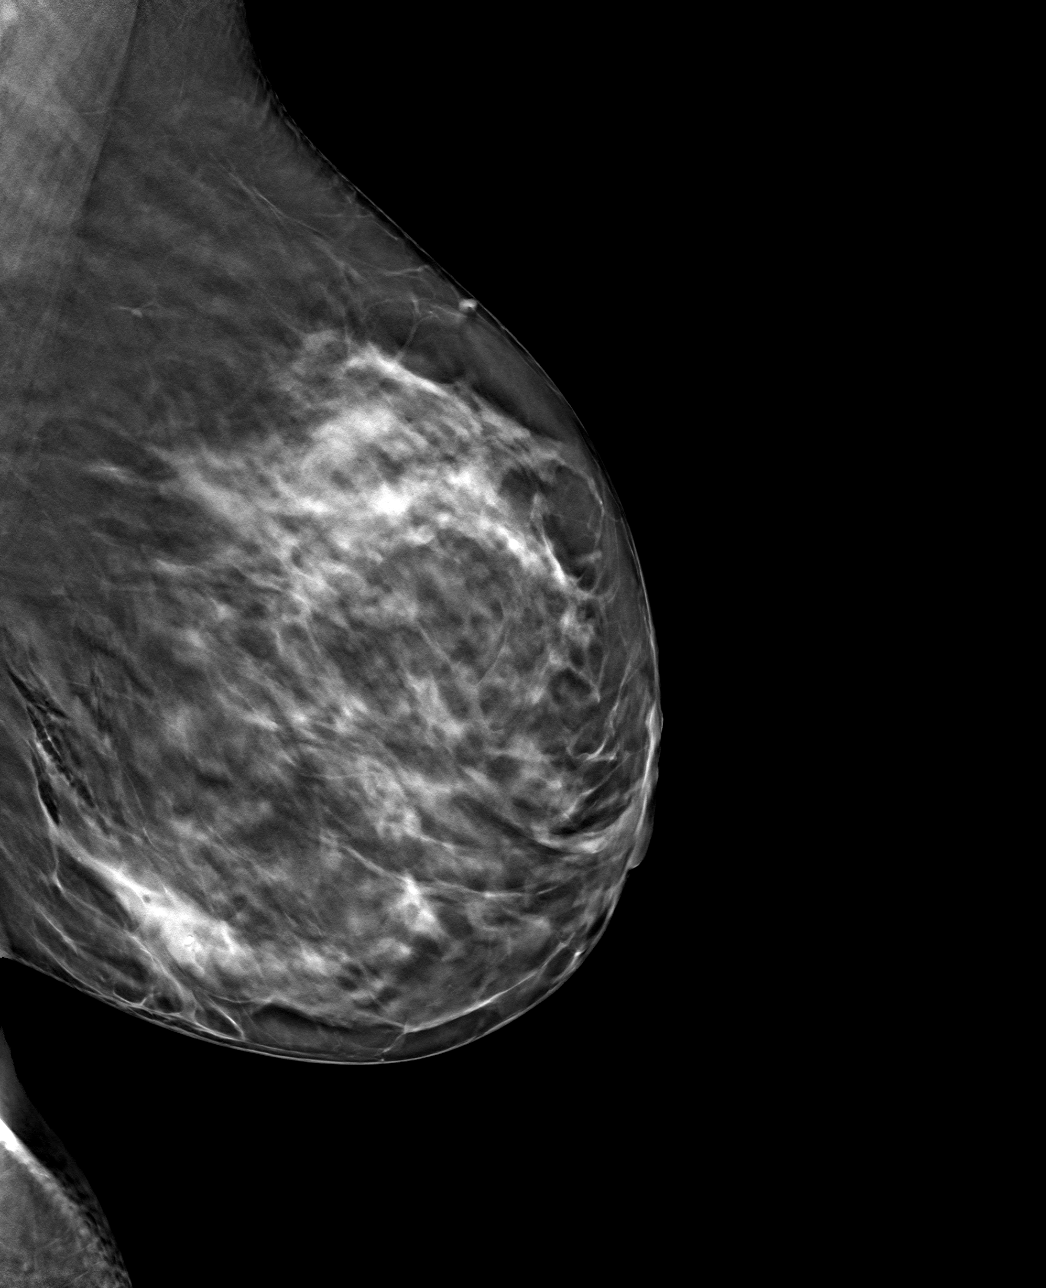

[4 of 12 positions shown; findings below may reference images not displayed]

FINDINGS: 3D Mammographic images were obtained following MRI guided biopsy of
the non-mass enhancement within the lower LEFT breast. The biopsy
marking clip is displaced approximately 2 cm medial to the biopsy
site.
IMPRESSION: Barbell shaped clip is displaced approximately 2 cm medial to the
biopsy site.

Final Assessment: Post Procedure Mammograms for Marker Placement

## 2020-11-21 IMAGING — MR MR BREAST BX W LOC DEV 1ST LESION IMAGE BX SPEC MR GUIDE*L*
11 of 16 series · 32 of 48 positions shown · IV contrast (6 ML GADAVIST)
Comparison: Previous exams.
COMPARISON: Previous exams.

Addendum:
CLINICAL DATA: Patient with suspicious non-mass enhancement within
the lower LEFT breast presents today for MRI guided biopsy.

History of RIGHT breast cancer diagnosed earlier this year, status
post chemotherapy.
EXAM:
MRI GUIDED CORE NEEDLE BIOPSY OF THE LEFT BREAST
TECHNIQUE: Multiplanar, multisequence MR imaging of the LEFT breast was
performed both before and after administration of intravenous
contrast.
CONTRAST:  10 cc Gadavist

[Series 2: fiducial unilateral · sagittal · 2.0mm · 1.33mm/px · 2 of 52 slices shown]
[im 1/52]
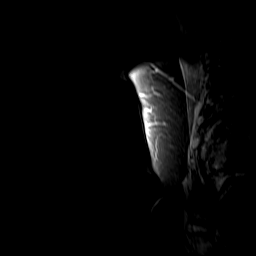
[im 52/52]
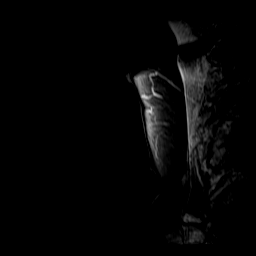

[Series 3: dynamic pre · axial · non-contrast · 1.3mm · 0.73mm/px · z∈[-82,+104]mm · 4 of 144 slices shown]
[im 1/144]
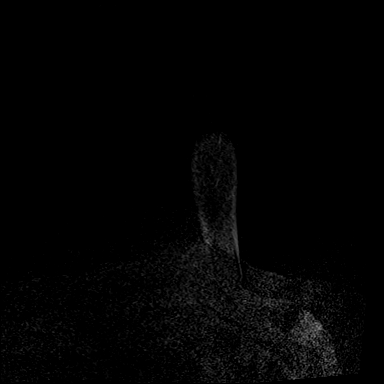
[im 48/144]
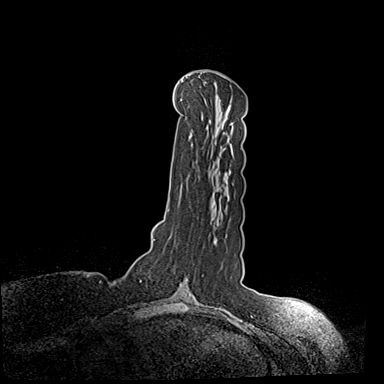
[im 96/144]
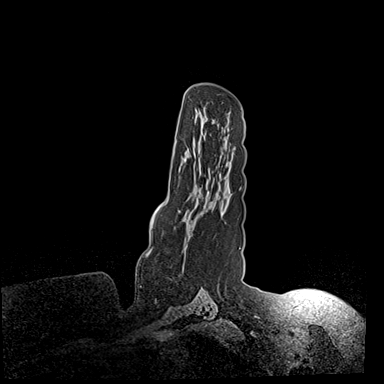
[im 144/144]
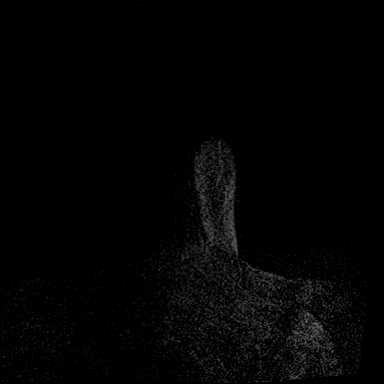

[Series 4: dynamic post 20 · axial · 1.3mm · 0.73mm/px · z∈[-82,+104]mm · 3 of 144 slices shown (1 of 2)]
[im 1/144]
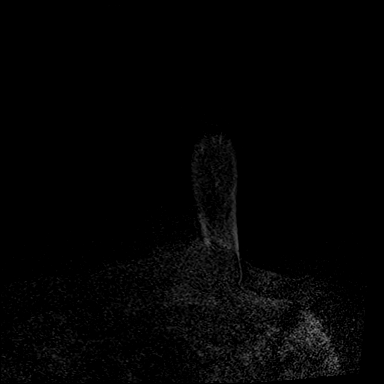
[im 72/144]
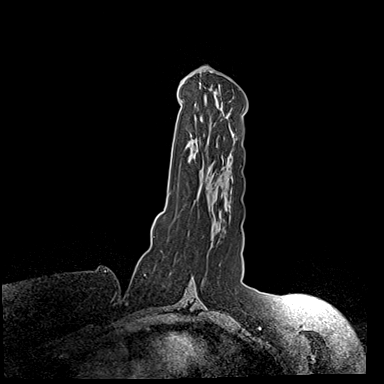
[im 144/144]
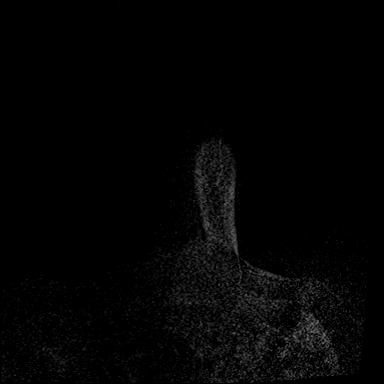

[Series 5: dynamic post 20 · axial · 1.3mm · 0.73mm/px · z∈[-82,+104]mm · 3 of 144 slices shown (2 of 2)]
[im 1/144]
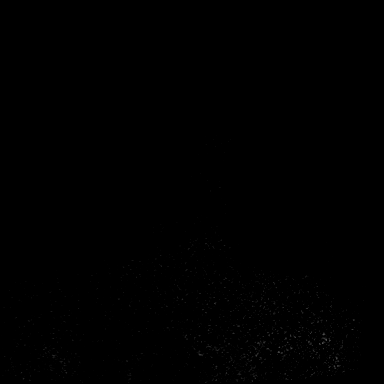
[im 72/144]
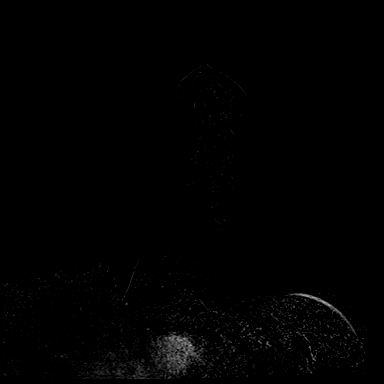
[im 144/144]
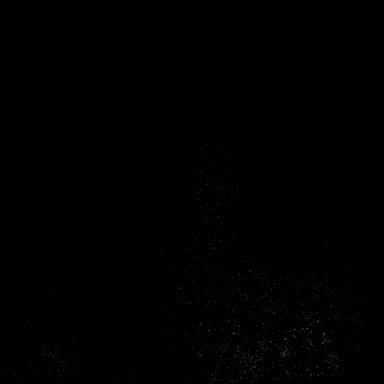

[Series 6: dynamic post 3 · axial · 1.3mm · 0.73mm/px · z∈[-82,+104]mm · 3 of 144 slices shown (1 of 2)]
[im 1/144]
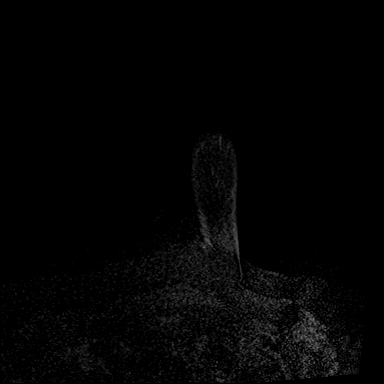
[im 72/144]
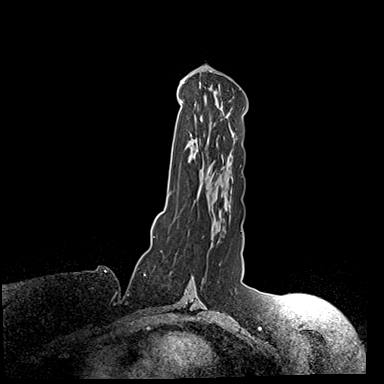
[im 144/144]
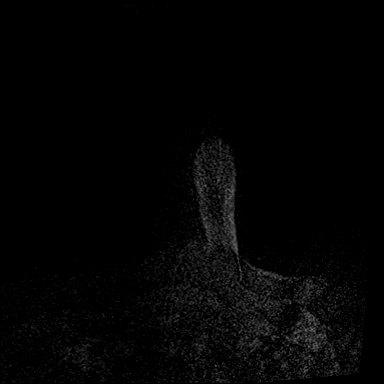

[Series 7: dynamic post 3 · axial · 1.3mm · 0.73mm/px · z∈[-82,+104]mm · 3 of 144 slices shown (2 of 2)]
[im 1/144]
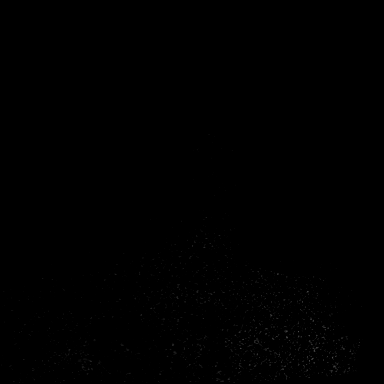
[im 72/144]
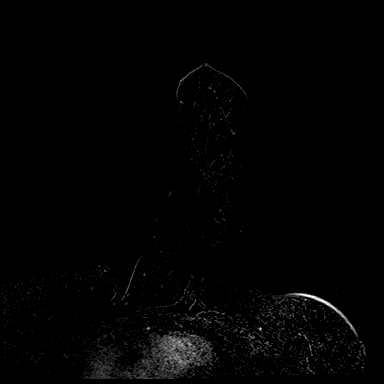
[im 144/144]
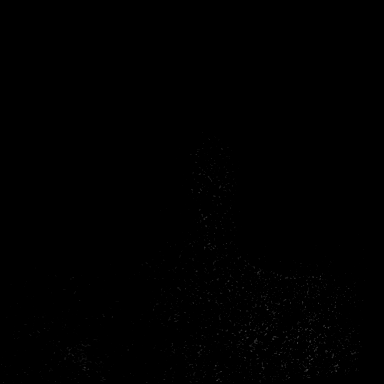

[Series 8: dynamic post 5 · axial · 1.3mm · 0.73mm/px · z∈[-82,+104]mm · 3 of 144 slices shown (1 of 2)]
[im 1/144]
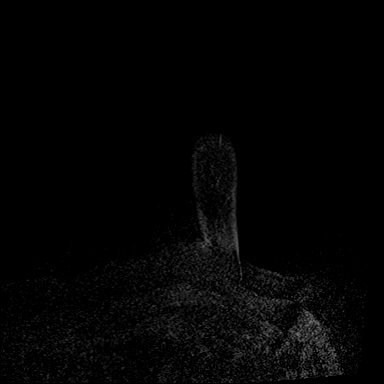
[im 72/144]
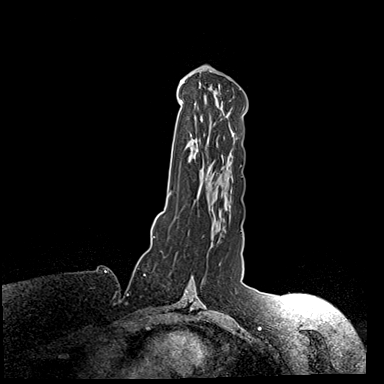
[im 144/144]
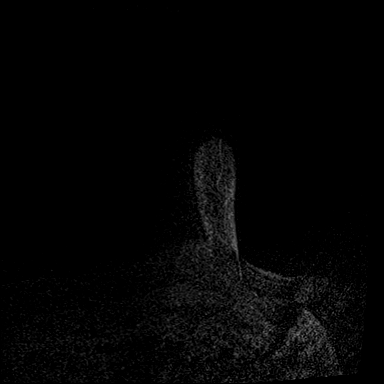

[Series 9: dynamic post 5 · axial · 1.3mm · 0.73mm/px · z∈[-82,+104]mm · 3 of 144 slices shown (2 of 2)]
[im 1/144]
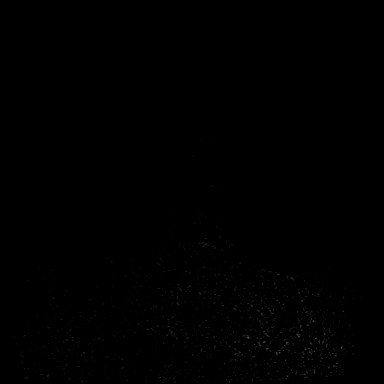
[im 72/144]
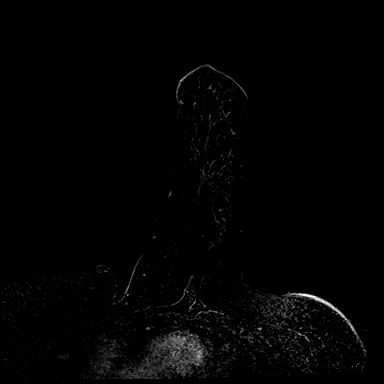
[im 144/144]
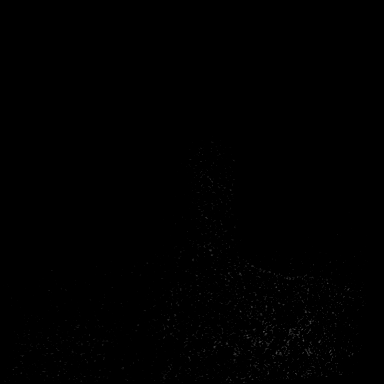

[Series 10: dynamic post 9 · axial · 1.3mm · 0.73mm/px · z∈[-82,+104]mm · 3 of 144 slices shown (1 of 2)]
[im 1/144]
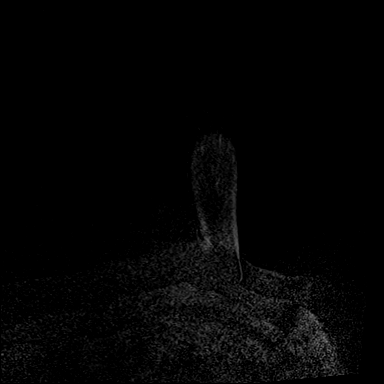
[im 72/144]
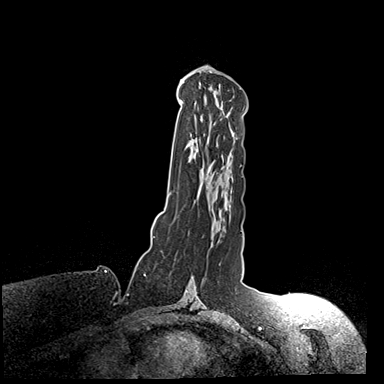
[im 144/144]
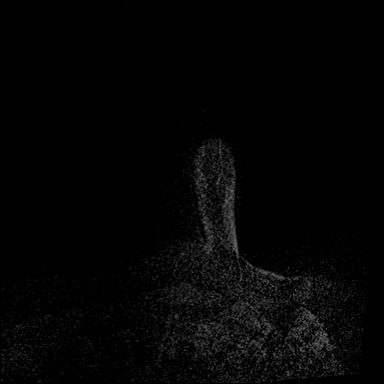

[Series 11: dynamic post 9 · axial · 1.3mm · 0.73mm/px · z∈[-82,+104]mm · 3 of 144 slices shown (2 of 2)]
[im 1/144]
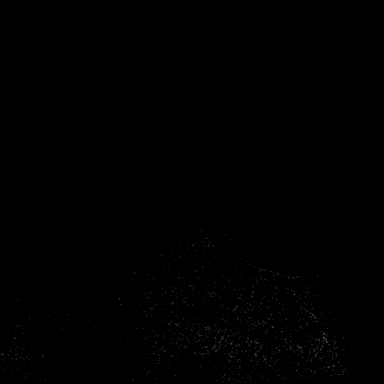
[im 72/144]
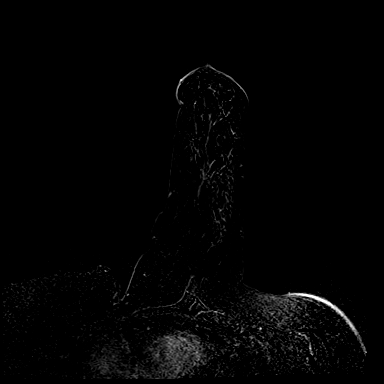
[im 144/144]
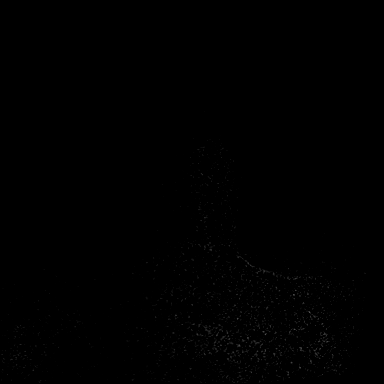

[Series 12: needle confirmation · axial · 1.3mm · 0.73mm/px · z∈[-82,+10]mm · 2 of 144 slices shown]
[im 1/144]
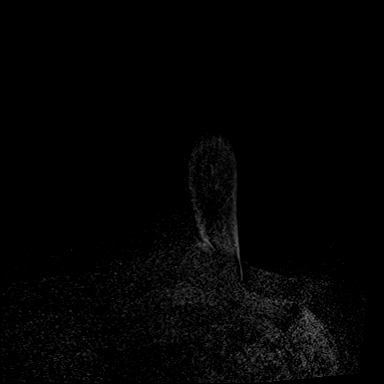
[im 72/144]
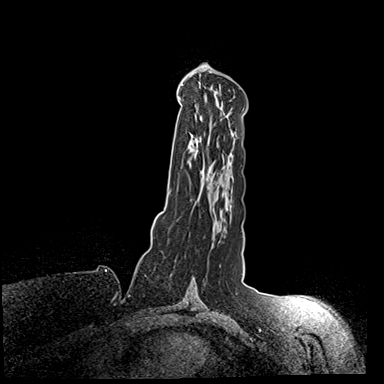

[32 of 48 positions shown; findings below may reference images not displayed]

FINDINGS: I met with the patient, and we discussed the procedure of MRI guided
biopsy, including risks, benefits, and alternatives. Specifically,
we discussed the risks of infection, bleeding, tissue injury, clip
migration, and inadequate sampling. Informed, written consent was
given. The usual time out protocol was performed immediately prior
to the procedure.

Pre-biopsy images were obtained for biopsy planning. The non-mass
enhancement is less conspicuous on today's study but adequately
persists for today's biopsy targeting.

Using sterile technique, 1% Lidocaine, MRI guidance, and a 9 gauge
vacuum assisted device, biopsy was performed of the non-mass
enhancement within the lower LEFT breast using a lateral approach.
At the conclusion of the procedure, a barbell shaped tissue marker
clip was deployed into the biopsy cavity. Follow-up 2-view mammogram
was performed and dictated separately.
IMPRESSION: MRI guided biopsy of the non-mass enhancement within the lower LEFT
breast. No apparent complications.

ADDENDUM:
Pathology revealed FIBROCYSTIC CHANGES WITH CALCIFICATIONS, FOCAL
FIBROADENOMATOID CHANGE of the LEFT breast, lower, 6 o'clock,
(barbell shaped clip). This was found to be concordant by Dr. OMBREDANE
OMBREDANE.

Pathology results were discussed with the patient by telephone. The
patient reported doing well after the biopsy with tenderness at the
site. Post biopsy instructions and care were reviewed and questions
were answered. The patient was encouraged to call The [REDACTED] for any additional concerns. My direct phone
number was provided.

The patient was instructed to return for a bilateral breast MRI in 6
months, per protocol.

The patient has a recent diagnosis of RIGHT breast cancer and should
follow her outlined treatment plan.

Dr. OMBREDANE was notified of biopsy results via [REDACTED]
message on [DATE].

Pathology results reported by OMBREDANE, RN on [DATE].

*** End of Addendum ***
FINDINGS: I met with the patient, and we discussed the procedure of MRI guided
biopsy, including risks, benefits, and alternatives. Specifically,
we discussed the risks of infection, bleeding, tissue injury, clip
migration, and inadequate sampling. Informed, written consent was
given. The usual time out protocol was performed immediately prior
to the procedure.

Pre-biopsy images were obtained for biopsy planning. The non-mass
enhancement is less conspicuous on today's study but adequately
persists for today's biopsy targeting.

Using sterile technique, 1% Lidocaine, MRI guidance, and a 9 gauge
vacuum assisted device, biopsy was performed of the non-mass
enhancement within the lower LEFT breast using a lateral approach.
At the conclusion of the procedure, a barbell shaped tissue marker
clip was deployed into the biopsy cavity. Follow-up 2-view mammogram
was performed and dictated separately.
IMPRESSION: MRI guided biopsy of the non-mass enhancement within the lower LEFT
breast. No apparent complications.

## 2020-11-22 ENCOUNTER — Other Ambulatory Visit: Payer: Self-pay | Admitting: Hematology and Oncology

## 2020-11-22 ENCOUNTER — Other Ambulatory Visit: Payer: Self-pay

## 2020-11-22 ENCOUNTER — Inpatient Hospital Stay: Payer: BC Managed Care – PPO

## 2020-11-22 DIAGNOSIS — Z170 Estrogen receptor positive status [ER+]: Secondary | ICD-10-CM

## 2020-11-22 DIAGNOSIS — C50411 Malignant neoplasm of upper-outer quadrant of right female breast: Secondary | ICD-10-CM

## 2020-11-22 DIAGNOSIS — Z5112 Encounter for antineoplastic immunotherapy: Secondary | ICD-10-CM | POA: Diagnosis not present

## 2020-11-22 MED ORDER — PROCHLORPERAZINE MALEATE 10 MG PO TABS: ORAL_TABLET | ORAL | 1 refills | Status: DC

## 2020-11-22 MED ORDER — SODIUM CHLORIDE 0.9 % IV SOLN
INTRAVENOUS | Status: AC
  Filled 2020-11-22 (×2): qty 250

## 2020-11-22 NOTE — Patient Instructions (Signed)

## 2020-12-04 ENCOUNTER — Encounter (HOSPITAL_BASED_OUTPATIENT_CLINIC_OR_DEPARTMENT_OTHER): Payer: Self-pay | Admitting: General Surgery

## 2020-12-04 ENCOUNTER — Other Ambulatory Visit: Payer: Self-pay

## 2020-12-06 ENCOUNTER — Other Ambulatory Visit: Payer: Self-pay

## 2020-12-06 ENCOUNTER — Inpatient Hospital Stay: Payer: BC Managed Care – PPO

## 2020-12-06 VITALS — BP 116/54 | HR 70 | Temp 98.5°F | Resp 16

## 2020-12-06 DIAGNOSIS — Z170 Estrogen receptor positive status [ER+]: Secondary | ICD-10-CM

## 2020-12-06 DIAGNOSIS — Z5112 Encounter for antineoplastic immunotherapy: Secondary | ICD-10-CM | POA: Diagnosis not present

## 2020-12-06 LAB — CBC WITH DIFFERENTIAL (CANCER CENTER ONLY)
Abs Immature Granulocytes: 0.02 10*3/uL (ref 0.00–0.07)
Basophils Absolute: 0 10*3/uL (ref 0.0–0.1)
Basophils Relative: 1 %
Eosinophils Absolute: 0 10*3/uL (ref 0.0–0.5)
Eosinophils Relative: 0 %
HCT: 29.7 % — ABNORMAL LOW (ref 36.0–46.0)
Hemoglobin: 10 g/dL — ABNORMAL LOW (ref 12.0–15.0)
Immature Granulocytes: 1 %
Lymphocytes Relative: 34 %
Lymphs Abs: 1.5 10*3/uL (ref 0.7–4.0)
MCH: 33.1 pg (ref 26.0–34.0)
MCHC: 33.7 g/dL (ref 30.0–36.0)
MCV: 98.3 fL (ref 80.0–100.0)
Monocytes Absolute: 0.6 10*3/uL (ref 0.1–1.0)
Monocytes Relative: 14 %
Neutro Abs: 2.2 10*3/uL (ref 1.7–7.7)
Neutrophils Relative %: 50 %
Platelet Count: 141 10*3/uL — ABNORMAL LOW (ref 150–400)
RBC: 3.02 MIL/uL — ABNORMAL LOW (ref 3.87–5.11)
RDW: 16.5 % — ABNORMAL HIGH (ref 11.5–15.5)
WBC Count: 4.4 10*3/uL (ref 4.0–10.5)
nRBC: 0 % (ref 0.0–0.2)

## 2020-12-06 LAB — CMP (CANCER CENTER ONLY)
ALT: 30 U/L (ref 0–44)
AST: 20 U/L (ref 15–41)
Albumin: 3.8 g/dL (ref 3.5–5.0)
Alkaline Phosphatase: 108 U/L (ref 38–126)
Anion gap: 9 (ref 5–15)
BUN: 16 mg/dL (ref 6–20)
CO2: 25 mmol/L (ref 22–32)
Calcium: 8.9 mg/dL (ref 8.9–10.3)
Chloride: 106 mmol/L (ref 98–111)
Creatinine: 0.79 mg/dL (ref 0.44–1.00)
GFR, Estimated: >60 mL/min (ref >60)
Glucose, Bld: 114 mg/dL — ABNORMAL HIGH (ref 70–99)
Potassium: 4.3 mmol/L (ref 3.5–5.1)
Sodium: 140 mmol/L (ref 135–145)
Total Bilirubin: 0.3 mg/dL (ref 0.3–1.2)
Total Protein: 6.9 g/dL (ref 6.5–8.1)

## 2020-12-06 MED ORDER — SODIUM CHLORIDE 0.9 % IV SOLN
420.0000 mg | Freq: Once | INTRAVENOUS | Status: AC
  Administered 2020-12-06: 420 mg via INTRAVENOUS
  Filled 2020-12-06: qty 14

## 2020-12-06 MED ORDER — DIPHENHYDRAMINE HCL 25 MG PO CAPS
25.0000 mg | ORAL_CAPSULE | Freq: Once | ORAL | Status: AC
  Administered 2020-12-06: 25 mg via ORAL
  Filled 2020-12-06: qty 1

## 2020-12-06 MED ORDER — SODIUM CHLORIDE 0.9 % IV SOLN: Freq: Once | INTRAVENOUS | Status: AC

## 2020-12-06 MED ORDER — ENSURE PRE-SURGERY PO LIQD: 296.0000 mL | Freq: Once | ORAL | Status: DC

## 2020-12-06 MED ORDER — ACETAMINOPHEN 325 MG PO TABS
650.0000 mg | ORAL_TABLET | Freq: Once | ORAL | Status: AC
  Administered 2020-12-06: 650 mg via ORAL
  Filled 2020-12-06: qty 2

## 2020-12-06 MED ORDER — TRASTUZUMAB-DKST CHEMO 150 MG IV SOLR
6.0000 mg/kg | Freq: Once | INTRAVENOUS | Status: AC
  Administered 2020-12-06: 546 mg via INTRAVENOUS
  Filled 2020-12-06: qty 26

## 2020-12-06 MED ORDER — HEPARIN SOD (PORK) LOCK FLUSH 100 UNIT/ML IV SOLN
500.0000 [IU] | Freq: Once | INTRAVENOUS | Status: AC | PRN
  Administered 2020-12-06: 500 [IU]

## 2020-12-06 MED ORDER — SODIUM CHLORIDE 0.9% FLUSH
10.0000 mL | INTRAVENOUS | Status: DC | PRN
  Administered 2020-12-06: 10 mL

## 2020-12-06 NOTE — Patient Instructions (Signed)
[facility]  Discharge Instructions: Thank you for choosing [facility] to provide your oncology and hematology care.   If you have a lab appointment with the [facility], please go directly to the [facility] and check in at the registration area.   Wear comfortable clothing and clothing appropriate for easy access to any Portacath or PICC line.   We strive to give you quality time with your provider. You may need to reschedule your appointment if you arrive late (15 or more minutes).  Arriving late affects you and other patients whose appointments are after yours.  Also, if you miss three or more appointments without notifying the office, you may be dismissed from the clinic at the provider's discretion.      For prescription refill requests, have your pharmacy contact our office and allow 72 hours for refills to be completed.    Today you received the following chemotherapy and/or immunotherapy agents trastuzumab/pertuzumab       To help prevent nausea and vomiting after your treatment, we encourage you to take your nausea medication as directed.  BELOW ARE SYMPTOMS THAT SHOULD BE REPORTED IMMEDIATELY: *FEVER GREATER THAN 100.4 F (38 C) OR HIGHER *CHILLS OR SWEATING *NAUSEA AND VOMITING THAT IS NOT CONTROLLED WITH YOUR NAUSEA MEDICATION *UNUSUAL SHORTNESS OF BREATH *UNUSUAL BRUISING OR BLEEDING *URINARY PROBLEMS (pain or burning when urinating, or frequent urination) *BOWEL PROBLEMS (unusual diarrhea, constipation, pain near the anus) TENDERNESS IN MOUTH AND THROAT WITH OR WITHOUT PRESENCE OF ULCERS (sore throat, sores in mouth, or a toothache) UNUSUAL RASH, SWELLING OR PAIN  UNUSUAL VAGINAL DISCHARGE OR ITCHING   Items with * indicate a potential emergency and should be followed up as soon as possible or go to the Emergency Department if any problems should occur.  Please show the CHEMOTHERAPY ALERT CARD or IMMUNOTHERAPY ALERT CARD at  check-in to the Emergency Department and triage nurse.  Should you have questions after your visit or need to cancel or reschedule your appointment, please contact [facility]  Dept: [phone]  and follow the prompts.  Office hours are 8:00 a.m. to 4:30 p.m. Monday - Friday. Please note that voicemails left after 4:00 p.m. may not be returned until the following business day.  We are closed weekends and major holidays. You have access to a nurse at all times for urgent questions. Please call the main number to the clinic Dept: [phone] and follow the prompts.   For any non-urgent questions, you may also contact your provider using MyChart. We now offer e-Visits for anyone 94 and older to request care online for non-urgent symptoms. For details visit [email or web address].   Also download the MyChart app! Go to the app store, search "MyChart", open the app, select , and log in with your MyChart username and password.  Due to Covid, a mask is required upon entering the hospital/clinic. If you do not have a mask, one will be given to you upon arrival. For doctor visits, patients may have 1 support person aged 88 or older with them. For treatment visits, patients cannot have anyone with them due to current Covid guidelines and our immunocompromised population.

## 2020-12-06 NOTE — Progress Notes (Signed)

## 2020-12-09 ENCOUNTER — Ambulatory Visit
Admission: RE | Admit: 2020-12-09 | Discharge: 2020-12-09 | Disposition: A | Discharge status: Home / Self Care | Payer: BC Managed Care – PPO | Source: Ambulatory Visit | Attending: General Surgery | Admitting: General Surgery

## 2020-12-09 ENCOUNTER — Other Ambulatory Visit: Payer: Self-pay

## 2020-12-09 DIAGNOSIS — Z170 Estrogen receptor positive status [ER+]: Secondary | ICD-10-CM

## 2020-12-09 DIAGNOSIS — C50411 Malignant neoplasm of upper-outer quadrant of right female breast: Secondary | ICD-10-CM

## 2020-12-09 IMAGING — MG MM PLC BREAST LOC DEV 1ST LESION INC*R*
8 of 10 series · 8 of 10 positions shown · non-contrast
Comparison: Previous exam(s).

CLINICAL DATA: 50-year-old female with history of right breast
cancer presenting for seed localization prior to surgery.

EXAM:
MAMMOGRAPHIC GUIDED RADIOACTIVE SEED LOCALIZATION OF THE RIGHT
BREAST x 2

[R CC (1 of 3)]
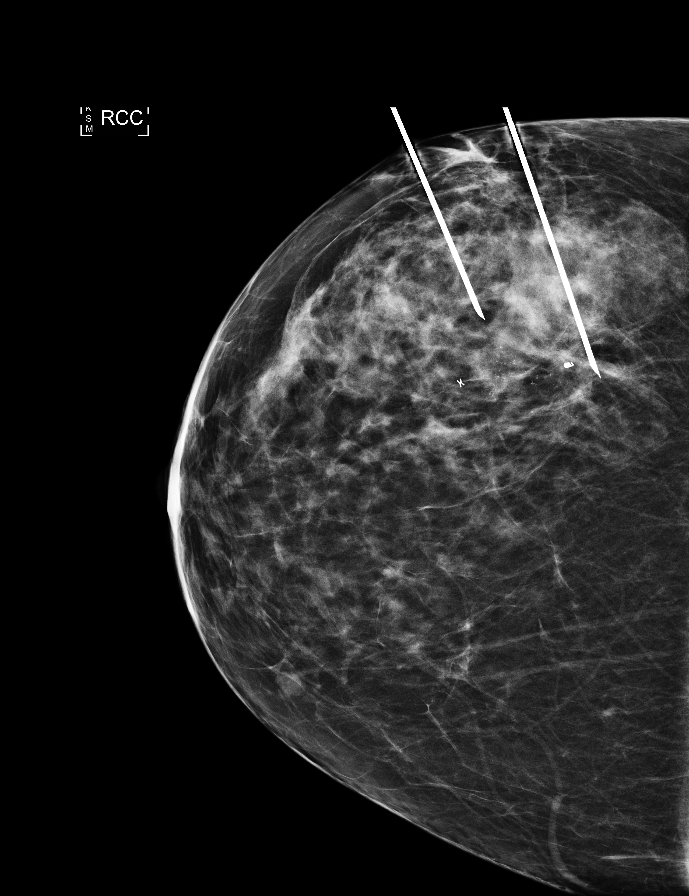

[R CC (2 of 3)]
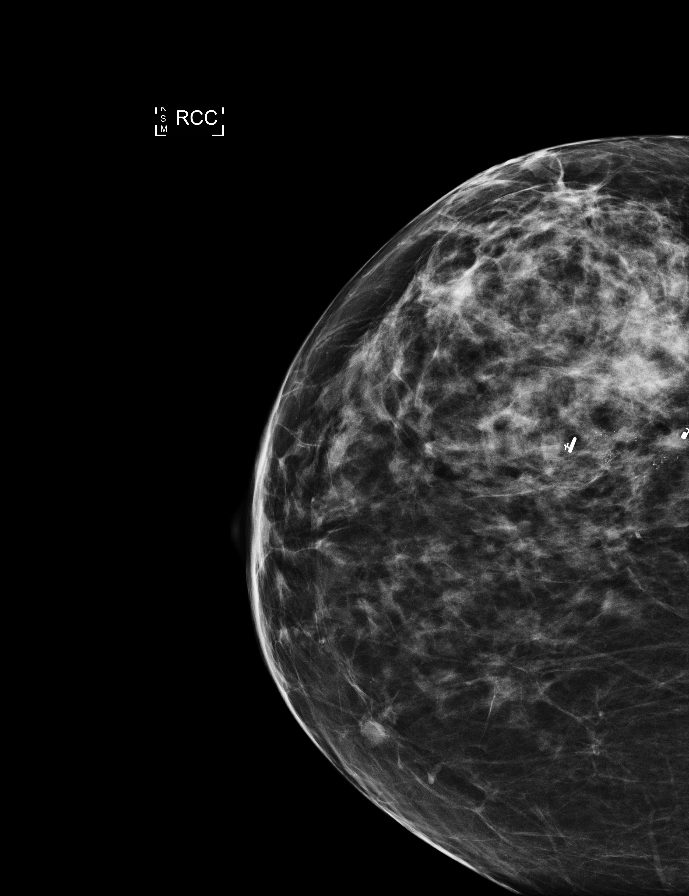

[R LM (1 of 5)]
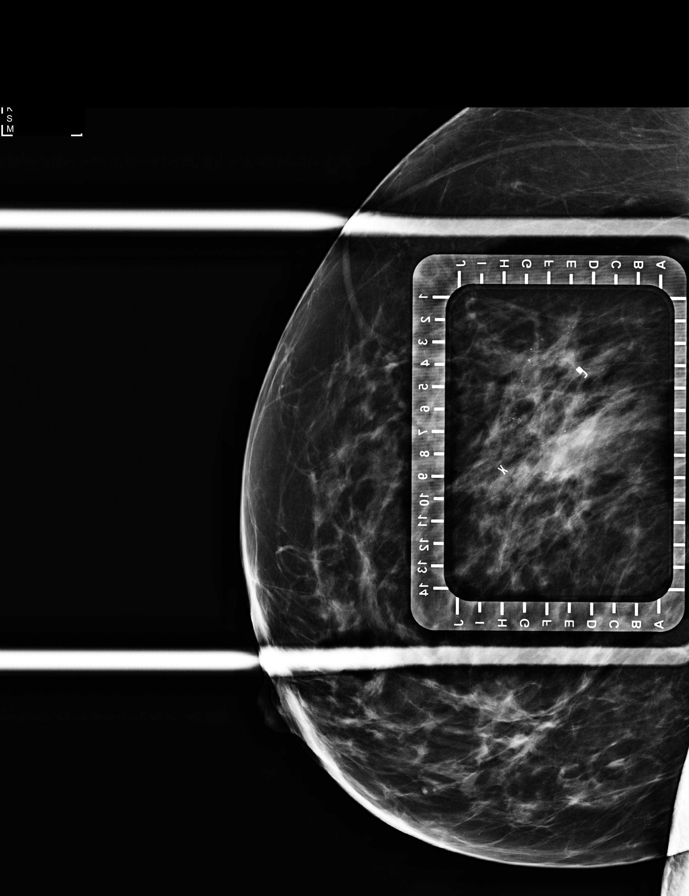

[R LM (2 of 5)]
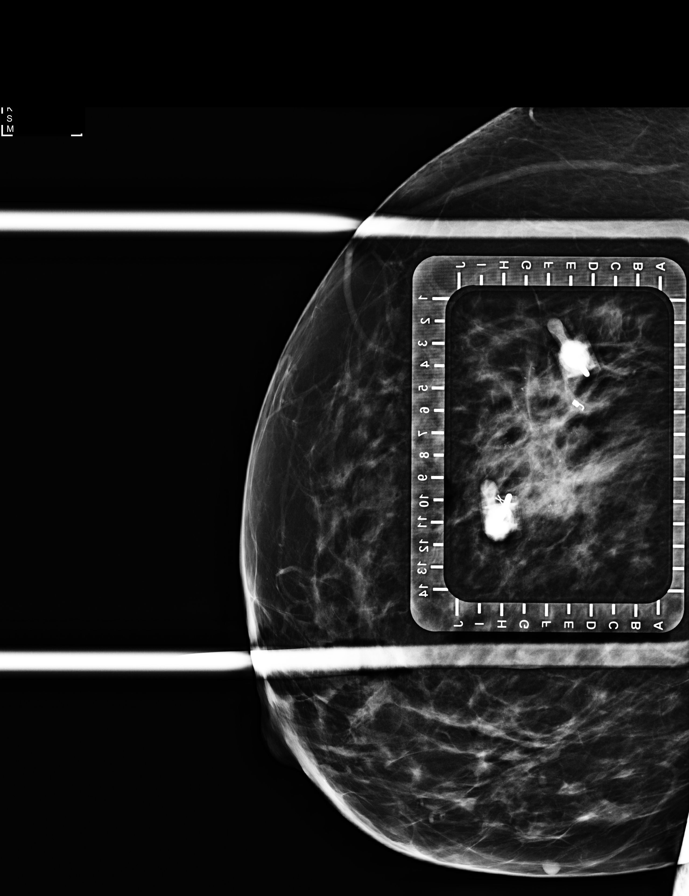

[R LM (3 of 5)]
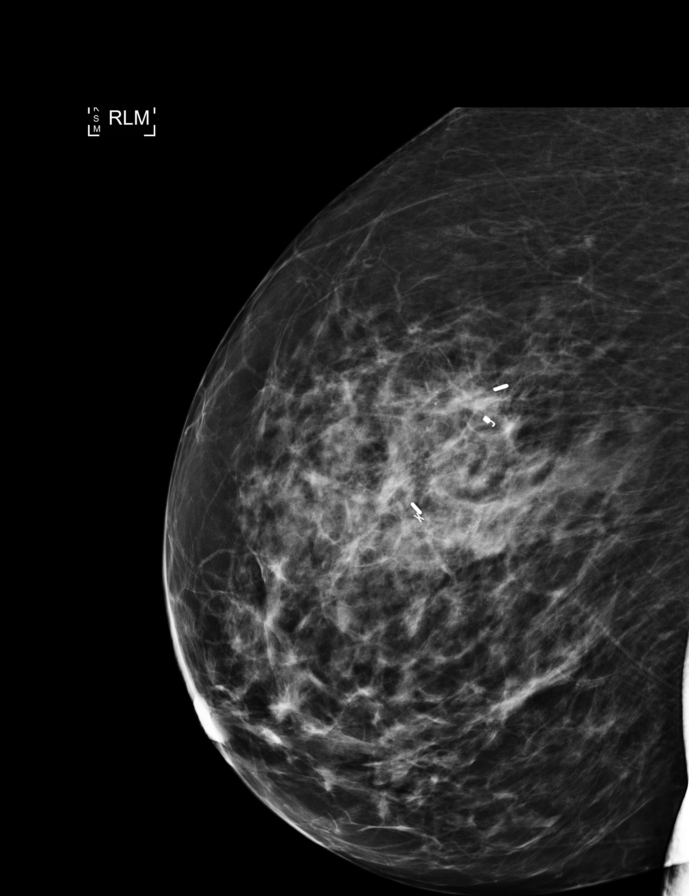

[R LM (4 of 5)]
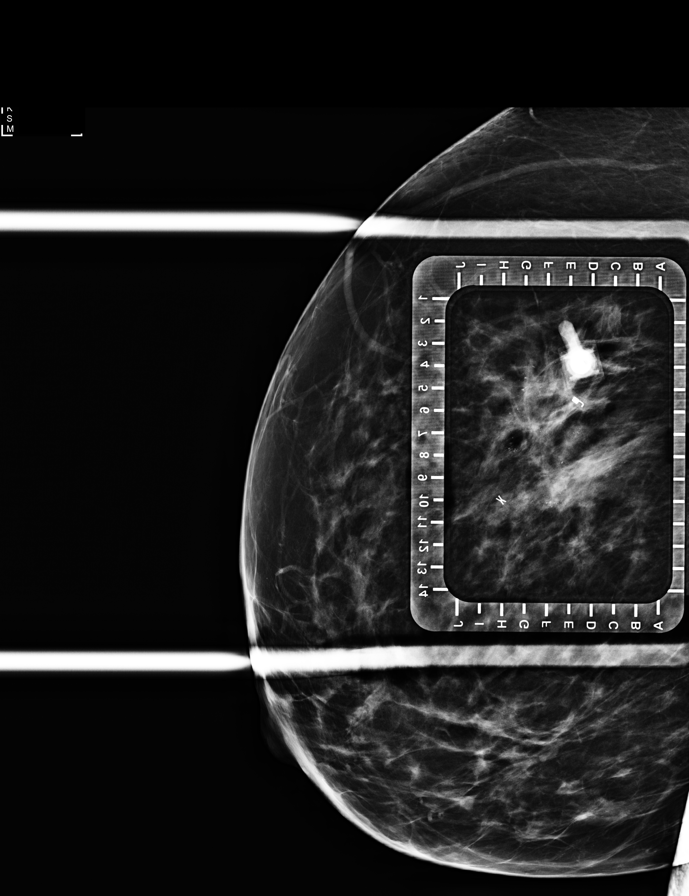

[R LM (5 of 5)]
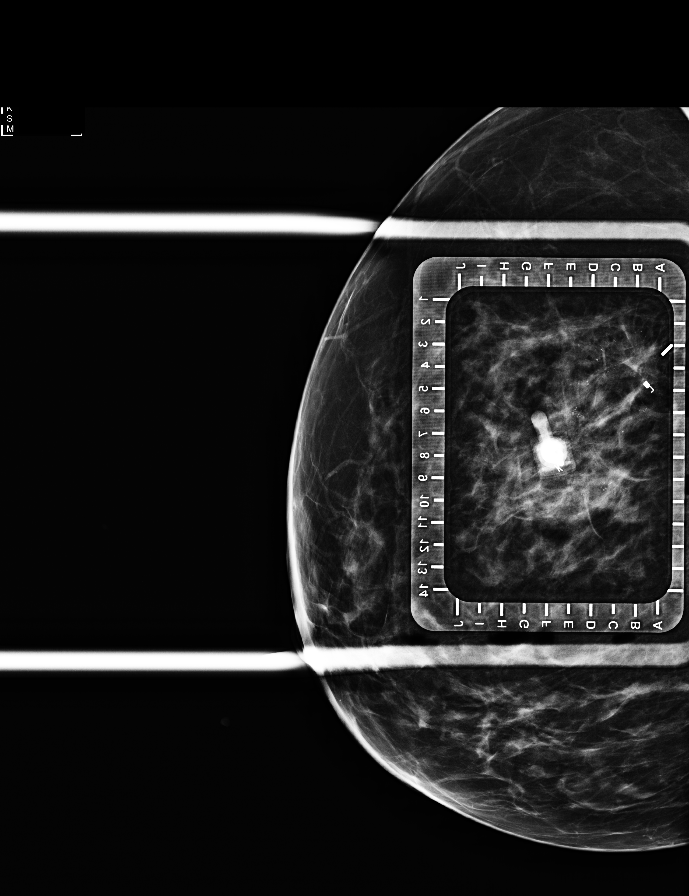

[R CC (3 of 3)]
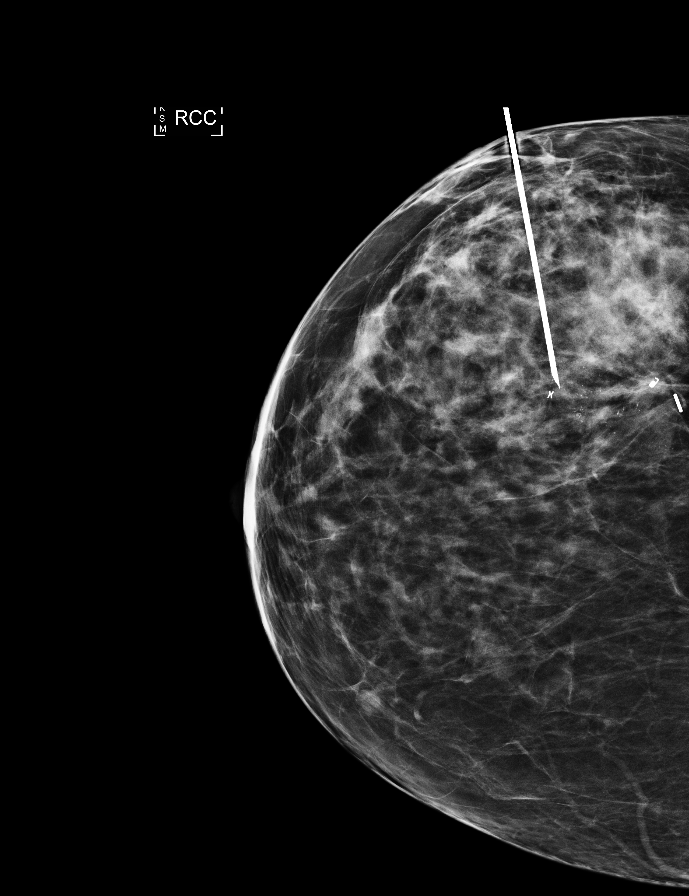

[8 of 10 positions shown; findings below may reference images not displayed]

FINDINGS: Patient presents for radioactive seed localization prior to . I met
with the patient and we discussed the procedure of seed localization
including benefits and alternatives. We discussed the high
likelihood of a successful procedure. We discussed the risks of the
procedure including infection, bleeding, tissue injury and further
surgery. We discussed the low dose of radioactivity involved in the
procedure. Informed, written consent was given.

The usual time-out protocol was performed immediately prior to the
procedure.

1. Using mammographic guidance, sterile technique, 1% lidocaine and
an [FI] radioactive seed, the coil biopsy clip was localized using
a lateral approach. The follow-up mammogram images confirm the seed
in the expected location and were marked for Dr. ZEINAB.

Follow-up survey of the patient confirms presence of the radioactive
seed.

Order number of [FI] seed:  [PHONE_NUMBER]

Total activity: 0.247 mCi  reference Date: [DATE] [DATE] [DATE]

[DATE]. Using mammographic guidance, sterile technique, 1% lidocaine and
an [FI] radioactive seed, the X biopsy clip was localized using a
lateral approach. The follow-up mammogram images confirm the seed in
the expected location and were marked for Dr. ZEINAB.

Follow-up survey of the patient confirms presence of the radioactive
seed.

Order number of [FI] seed:  [PHONE_NUMBER].

Total activity:  0.247 mCi  Reference Date: [DATE]

The patient tolerated the procedure well and was released from the
[REDACTED]. She was given instructions regarding seed removal.
IMPRESSION: Radioactive seed localization of the right breast. No apparent
complications.

## 2020-12-11 ENCOUNTER — Ambulatory Visit
Admission: RE | Admit: 2020-12-11 | Discharge: 2020-12-11 | Disposition: A | Discharge status: Home / Self Care | Payer: BC Managed Care – PPO | Source: Ambulatory Visit | Attending: General Surgery | Admitting: General Surgery

## 2020-12-11 ENCOUNTER — Ambulatory Visit (HOSPITAL_BASED_OUTPATIENT_CLINIC_OR_DEPARTMENT_OTHER): Payer: BC Managed Care – PPO | Admitting: Anesthesiology

## 2020-12-11 ENCOUNTER — Encounter (HOSPITAL_COMMUNITY)
Admission: RE | Admit: 2020-12-11 | Discharge: 2020-12-11 | Disposition: A | Discharge status: Home / Self Care | Payer: BC Managed Care – PPO | Source: Ambulatory Visit | Attending: General Surgery | Admitting: General Surgery

## 2020-12-11 ENCOUNTER — Encounter (HOSPITAL_BASED_OUTPATIENT_CLINIC_OR_DEPARTMENT_OTHER)
Admission: RE | Disposition: A | Discharge status: Home / Self Care | Payer: Self-pay | Source: Home / Self Care | Attending: General Surgery

## 2020-12-11 ENCOUNTER — Other Ambulatory Visit: Payer: Self-pay

## 2020-12-11 ENCOUNTER — Ambulatory Visit (HOSPITAL_BASED_OUTPATIENT_CLINIC_OR_DEPARTMENT_OTHER)
Admission: RE | Admit: 2020-12-11 | Discharge: 2020-12-11 | Disposition: A | Discharge status: Home / Self Care | Payer: BC Managed Care – PPO | Attending: General Surgery | Admitting: General Surgery

## 2020-12-11 ENCOUNTER — Encounter (HOSPITAL_COMMUNITY): Payer: BC Managed Care – PPO

## 2020-12-11 ENCOUNTER — Ambulatory Visit (HOSPITAL_COMMUNITY): Payer: BC Managed Care – PPO

## 2020-12-11 ENCOUNTER — Encounter (HOSPITAL_BASED_OUTPATIENT_CLINIC_OR_DEPARTMENT_OTHER): Payer: Self-pay | Admitting: General Surgery

## 2020-12-11 DIAGNOSIS — Z170 Estrogen receptor positive status [ER+]: Secondary | ICD-10-CM | POA: Insufficient documentation

## 2020-12-11 DIAGNOSIS — Z8249 Family history of ischemic heart disease and other diseases of the circulatory system: Secondary | ICD-10-CM | POA: Insufficient documentation

## 2020-12-11 DIAGNOSIS — Z8349 Family history of other endocrine, nutritional and metabolic diseases: Secondary | ICD-10-CM | POA: Insufficient documentation

## 2020-12-11 DIAGNOSIS — Z803 Family history of malignant neoplasm of breast: Secondary | ICD-10-CM | POA: Diagnosis not present

## 2020-12-11 DIAGNOSIS — C50411 Malignant neoplasm of upper-outer quadrant of right female breast: Secondary | ICD-10-CM

## 2020-12-11 DIAGNOSIS — Z79899 Other long term (current) drug therapy: Secondary | ICD-10-CM | POA: Insufficient documentation

## 2020-12-11 DIAGNOSIS — Z833 Family history of diabetes mellitus: Secondary | ICD-10-CM | POA: Insufficient documentation

## 2020-12-11 DIAGNOSIS — D0511 Intraductal carcinoma in situ of right breast: Secondary | ICD-10-CM | POA: Insufficient documentation

## 2020-12-11 DIAGNOSIS — Z885 Allergy status to narcotic agent status: Secondary | ICD-10-CM | POA: Insufficient documentation

## 2020-12-11 DIAGNOSIS — C50911 Malignant neoplasm of unspecified site of right female breast: Secondary | ICD-10-CM | POA: Diagnosis present

## 2020-12-11 DIAGNOSIS — Z9221 Personal history of antineoplastic chemotherapy: Secondary | ICD-10-CM | POA: Diagnosis not present

## 2020-12-11 HISTORY — PX: BREAST LUMPECTOMY WITH RADIOACTIVE SEED AND SENTINEL LYMPH NODE BIOPSY: SHX6550

## 2020-12-11 IMAGING — MG MM BREAST SURGICAL SPECIMEN
1 series · 2 of 2 positions shown · non-contrast
Comparison: Previous exam(s).

CLINICAL DATA: Evaluate specimen

EXAM:
SPECIMEN RADIOGRAPH OF THE RIGHT BREAST

[Series 2: R · right · 0.07mm/px · 2 of 2 slices shown]
[im 1/2]
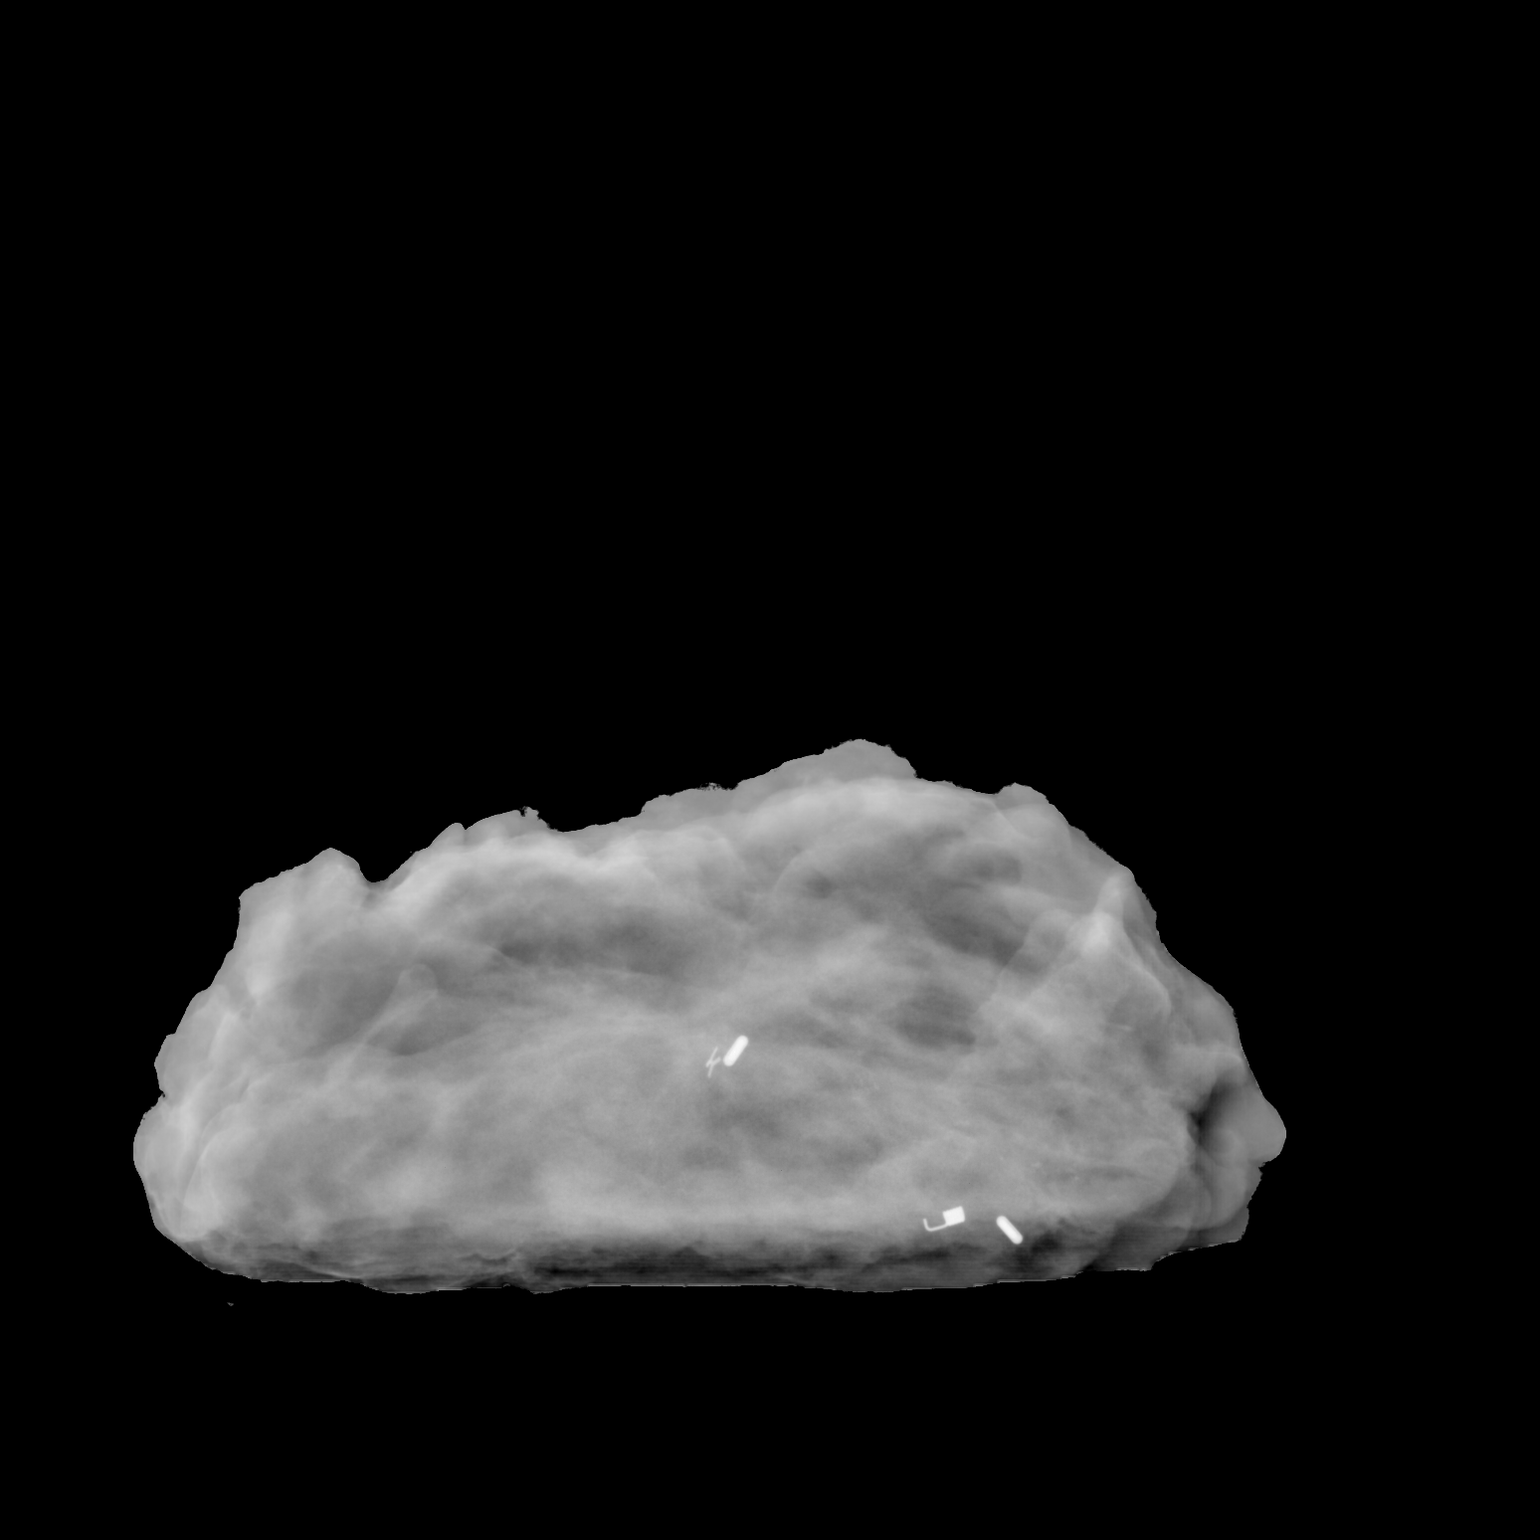
[im 2/2]
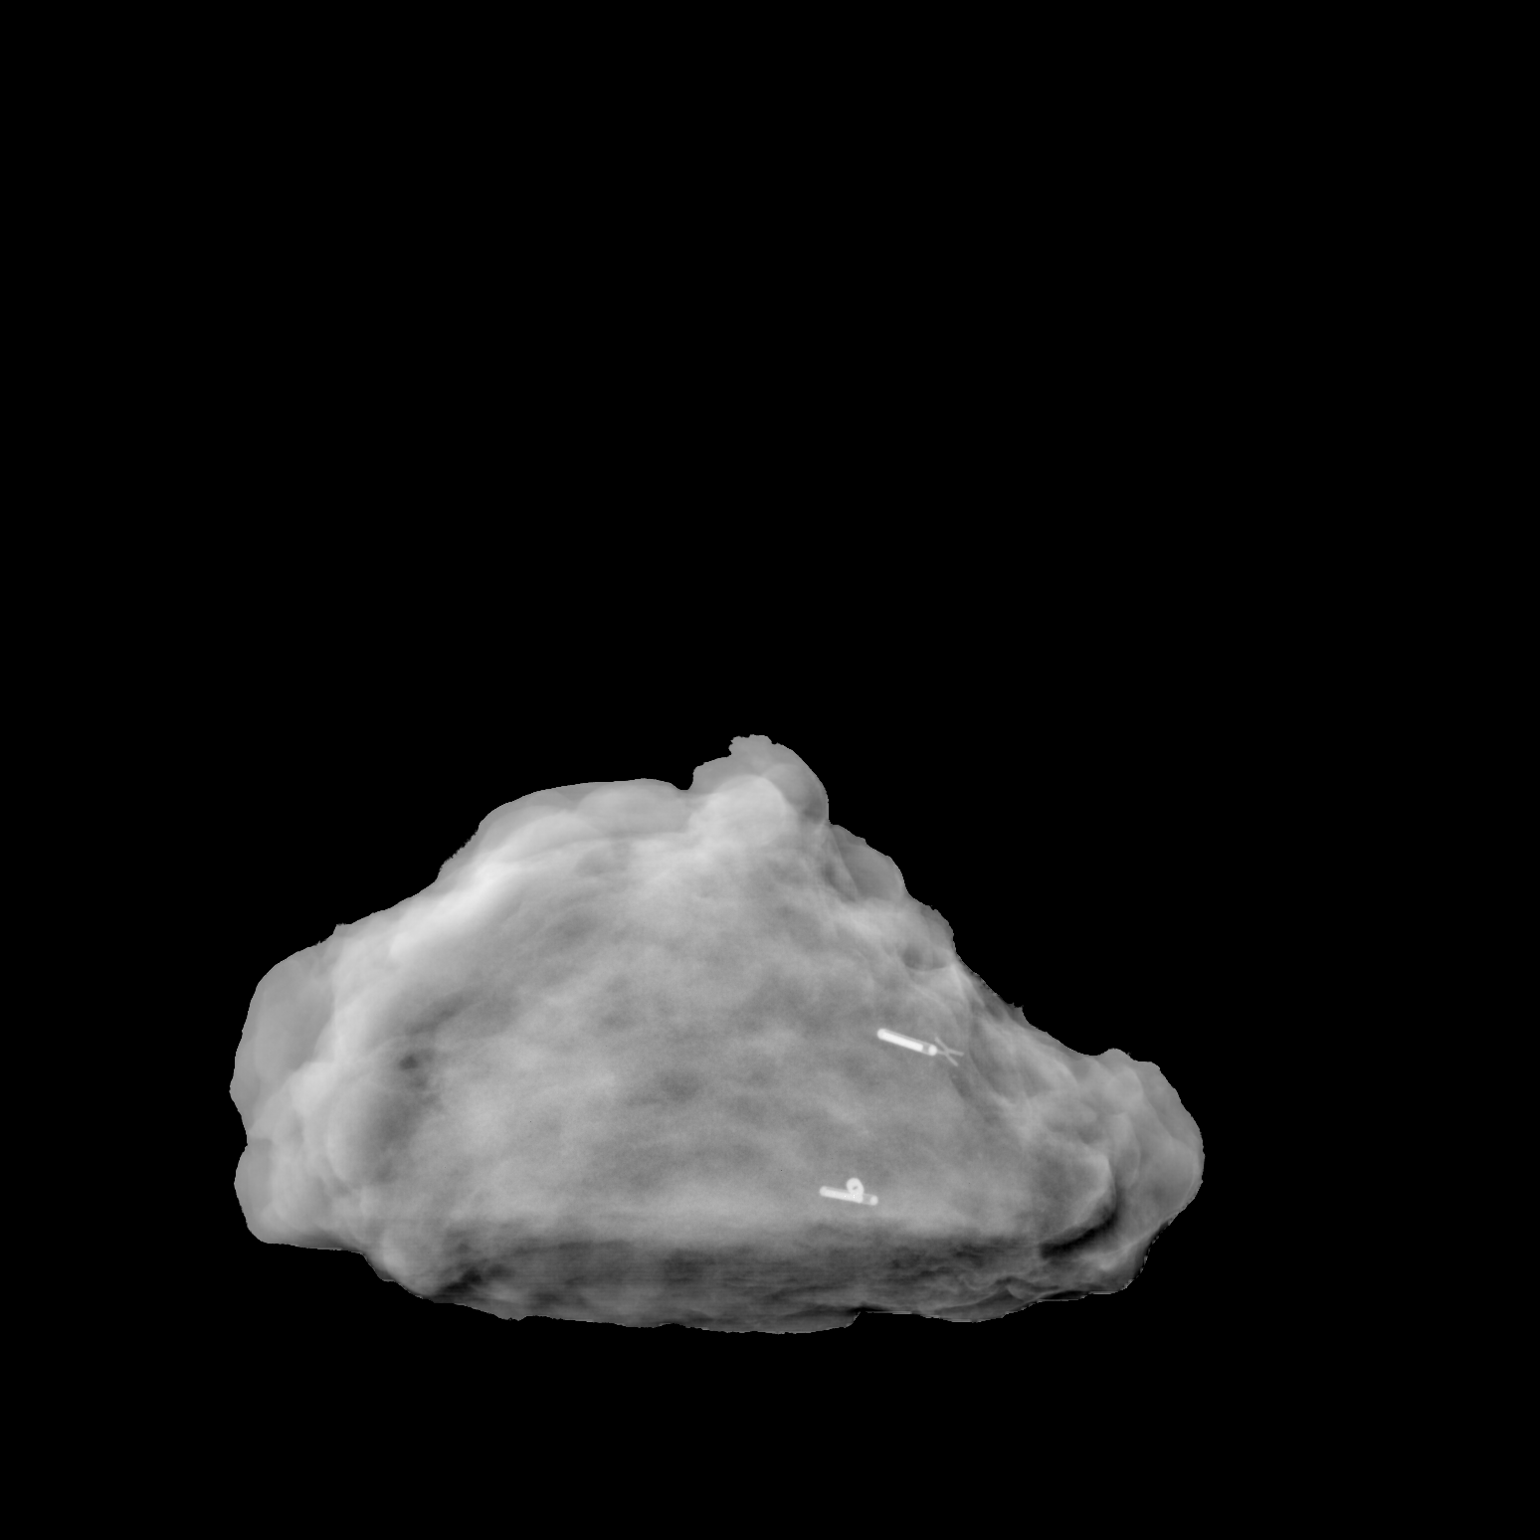

[2 of 2 positions shown; findings below may reference images not displayed]

FINDINGS: Status post excision of the right breast. The radioactive seeds and
biopsy marker clips are present, completely intact, and were marked
for pathology.
IMPRESSION: Specimen radiograph of the right breast.

## 2020-12-11 SURGERY — BREAST LUMPECTOMY WITH RADIOACTIVE SEED AND SENTINEL LYMPH NODE BIOPSY
Anesthesia: General | Site: Breast | Laterality: Right

## 2020-12-11 MED ORDER — CHLORHEXIDINE GLUCONATE CLOTH 2 % EX PADS: 6.0000 | MEDICATED_PAD | Freq: Once | CUTANEOUS | Status: DC

## 2020-12-11 MED ORDER — ACETAMINOPHEN 500 MG PO TABS
1000.0000 mg | ORAL_TABLET | ORAL | Status: AC
  Administered 2020-12-11: 1000 mg via ORAL

## 2020-12-11 MED ORDER — MEPERIDINE HCL 25 MG/ML IJ SOLN: 6.2500 mg | INTRAMUSCULAR | Status: DC | PRN

## 2020-12-11 MED ORDER — BUPIVACAINE HCL (PF) 0.25 % IJ SOLN
INTRAMUSCULAR | Status: DC | PRN
  Administered 2020-12-11: 6 mL

## 2020-12-11 MED ORDER — FENTANYL CITRATE (PF) 100 MCG/2ML IJ SOLN: 25.0000 ug | INTRAMUSCULAR | Status: DC | PRN

## 2020-12-11 MED ORDER — ACETAMINOPHEN 325 MG PO TABS: 325.0000 mg | ORAL_TABLET | ORAL | Status: DC | PRN

## 2020-12-11 MED ORDER — CEFAZOLIN SODIUM-DEXTROSE 2-4 GM/100ML-% IV SOLN
2.0000 g | INTRAVENOUS | Status: AC
  Administered 2020-12-11: 2 g via INTRAVENOUS

## 2020-12-11 MED ORDER — DEXAMETHASONE SODIUM PHOSPHATE 10 MG/ML IJ SOLN
INTRAMUSCULAR | Status: DC | PRN
  Administered 2020-12-11: 10 mg via INTRAVENOUS

## 2020-12-11 MED ORDER — CEFAZOLIN SODIUM-DEXTROSE 2-4 GM/100ML-% IV SOLN
INTRAVENOUS | Status: AC
  Filled 2020-12-11: qty 100

## 2020-12-11 MED ORDER — FENTANYL CITRATE (PF) 100 MCG/2ML IJ SOLN
100.0000 ug | Freq: Once | INTRAMUSCULAR | Status: AC
  Administered 2020-12-11: 100 ug via INTRAVENOUS

## 2020-12-11 MED ORDER — LIDOCAINE HCL (CARDIAC) PF 100 MG/5ML IV SOSY
PREFILLED_SYRINGE | INTRAVENOUS | Status: DC | PRN
  Administered 2020-12-11: 100 mg via INTRAVENOUS

## 2020-12-11 MED ORDER — MIDAZOLAM HCL 2 MG/2ML IJ SOLN
INTRAMUSCULAR | Status: AC
  Filled 2020-12-11: qty 2

## 2020-12-11 MED ORDER — TECHNETIUM TC 99M TILMANOCEPT KIT
1.0000 | PACK | Freq: Once | INTRAVENOUS | Status: AC | PRN
  Administered 2020-12-11: 1 via INTRADERMAL

## 2020-12-11 MED ORDER — KETOROLAC TROMETHAMINE 15 MG/ML IJ SOLN
INTRAMUSCULAR | Status: AC
  Filled 2020-12-11: qty 1

## 2020-12-11 MED ORDER — PROPOFOL 10 MG/ML IV BOLUS
INTRAVENOUS | Status: DC | PRN
  Administered 2020-12-11: 200 mg via INTRAVENOUS

## 2020-12-11 MED ORDER — ACETAMINOPHEN 500 MG PO TABS
ORAL_TABLET | ORAL | Status: AC
  Filled 2020-12-11: qty 2

## 2020-12-11 MED ORDER — FENTANYL CITRATE (PF) 100 MCG/2ML IJ SOLN
INTRAMUSCULAR | Status: DC | PRN
  Administered 2020-12-11: 50 ug via INTRAVENOUS
  Administered 2020-12-11 (×2): 25 ug via INTRAVENOUS

## 2020-12-11 MED ORDER — ONDANSETRON HCL 4 MG/2ML IJ SOLN
INTRAMUSCULAR | Status: DC | PRN
Start: 2020-12-11 — End: 2020-12-11
  Administered 2020-12-11: 4 mg via INTRAVENOUS

## 2020-12-11 MED ORDER — FENTANYL CITRATE (PF) 100 MCG/2ML IJ SOLN
INTRAMUSCULAR | Status: AC
  Filled 2020-12-11: qty 2

## 2020-12-11 MED ORDER — KETOROLAC TROMETHAMINE 15 MG/ML IJ SOLN
15.0000 mg | INTRAMUSCULAR | Status: AC
  Administered 2020-12-11: 15 mg via INTRAVENOUS

## 2020-12-11 MED ORDER — KETOROLAC TROMETHAMINE 30 MG/ML IJ SOLN: 30.0000 mg | Freq: Once | INTRAMUSCULAR | Status: DC | PRN

## 2020-12-11 MED ORDER — MIDAZOLAM HCL 2 MG/2ML IJ SOLN
2.0000 mg | Freq: Once | INTRAMUSCULAR | Status: AC
  Administered 2020-12-11: 2 mg via INTRAVENOUS

## 2020-12-11 MED ORDER — LACTATED RINGERS IV SOLN: INTRAVENOUS | Status: DC

## 2020-12-11 MED ORDER — DEXAMETHASONE SODIUM PHOSPHATE 10 MG/ML IJ SOLN
INTRAMUSCULAR | Status: DC | PRN
  Administered 2020-12-11: 10 mg

## 2020-12-11 MED ORDER — ACETAMINOPHEN 160 MG/5ML PO SOLN: 325.0000 mg | ORAL | Status: DC | PRN

## 2020-12-11 MED ORDER — TRAMADOL HCL 50 MG PO TABS: 100.0000 mg | ORAL_TABLET | Freq: Four times a day (QID) | ORAL | 0 refills | Status: DC | PRN

## 2020-12-11 MED ORDER — CLONIDINE HCL (ANALGESIA) 100 MCG/ML EP SOLN
EPIDURAL | Status: DC | PRN
  Administered 2020-12-11: 100 ug

## 2020-12-11 MED ORDER — BUPIVACAINE-EPINEPHRINE (PF) 0.5% -1:200000 IJ SOLN
INTRAMUSCULAR | Status: DC | PRN
  Administered 2020-12-11 (×6): 5 mL via PERINEURAL

## 2020-12-11 MED ORDER — ONDANSETRON HCL 4 MG/2ML IJ SOLN: 4.0000 mg | Freq: Once | INTRAMUSCULAR | Status: DC | PRN

## 2020-12-11 SURGICAL SUPPLY — 57 items
ADH SKN CLS APL DERMABOND .7 (GAUZE/BANDAGES/DRESSINGS) ×1
APL PRP STRL LF DISP 70% ISPRP (MISCELLANEOUS) ×1
APPLIER CLIP 9.375 MED OPEN (MISCELLANEOUS)
APR CLP MED 9.3 20 MLT OPN (MISCELLANEOUS)
BINDER BREAST LRG (GAUZE/BANDAGES/DRESSINGS) IMPLANT
BINDER BREAST MEDIUM (GAUZE/BANDAGES/DRESSINGS) IMPLANT
BINDER BREAST XLRG (GAUZE/BANDAGES/DRESSINGS) IMPLANT
BINDER BREAST XXLRG (GAUZE/BANDAGES/DRESSINGS) IMPLANT
BLADE SURG 15 STRL LF DISP TIS (BLADE) ×1 IMPLANT
BLADE SURG 15 STRL SS (BLADE) ×2
CANISTER SUC SOCK COL 7IN (MISCELLANEOUS) IMPLANT
CANISTER SUCT 1200ML W/VALVE (MISCELLANEOUS) IMPLANT
CHLORAPREP W/TINT 26 (MISCELLANEOUS) ×2 IMPLANT
CLIP APPLIE 9.375 MED OPEN (MISCELLANEOUS) IMPLANT
CLIP TI WIDE RED SMALL 6 (CLIP) IMPLANT
COVER BACK TABLE 60X90IN (DRAPES) ×2 IMPLANT
COVER MAYO STAND STRL (DRAPES) ×2 IMPLANT
COVER PROBE W GEL 5X96 (DRAPES) ×4 IMPLANT
DECANTER SPIKE VIAL GLASS SM (MISCELLANEOUS) IMPLANT
DERMABOND ADVANCED (GAUZE/BANDAGES/DRESSINGS) ×1
DERMABOND ADVANCED .7 DNX12 (GAUZE/BANDAGES/DRESSINGS) ×1 IMPLANT
DRAPE LAPAROSCOPIC ABDOMINAL (DRAPES) ×2 IMPLANT
DRAPE UTILITY XL STRL (DRAPES) ×2 IMPLANT
ELECT COATED BLADE 2.86 ST (ELECTRODE) ×2 IMPLANT
ELECT REM PT RETURN 9FT ADLT (ELECTROSURGICAL) ×2
ELECTRODE REM PT RTRN 9FT ADLT (ELECTROSURGICAL) ×1 IMPLANT
GLOVE SURG ENC MOIS LTX SZ7 (GLOVE) ×4 IMPLANT
GLOVE SURG UNDER POLY LF SZ7.5 (GLOVE) ×2 IMPLANT
GOWN STRL REUS W/ TWL LRG LVL3 (GOWN DISPOSABLE) ×3 IMPLANT
GOWN STRL REUS W/TWL LRG LVL3 (GOWN DISPOSABLE) ×6
HEMOSTAT ARISTA ABSORB 3G PWDR (HEMOSTASIS) IMPLANT
KIT MARKER MARGIN INK (KITS) ×2 IMPLANT
NDL SAFETY ECLIPSE 18X1.5 (NEEDLE) ×1 IMPLANT
NEEDLE HYPO 18GX1.5 SHARP (NEEDLE) ×2
NEEDLE HYPO 25X1 1.5 SAFETY (NEEDLE) ×4 IMPLANT
NS IRRIG 1000ML POUR BTL (IV SOLUTION) ×2 IMPLANT
PACK BASIN DAY SURGERY FS (CUSTOM PROCEDURE TRAY) ×2 IMPLANT
PENCIL SMOKE EVACUATOR (MISCELLANEOUS) ×2 IMPLANT
RETRACTOR ONETRAX LX 90X20 (MISCELLANEOUS) IMPLANT
SLEEVE SCD COMPRESS KNEE MED (STOCKING) ×2 IMPLANT
SPONGE T-LAP 4X18 ~~LOC~~+RFID (SPONGE) ×4 IMPLANT
STRIP CLOSURE SKIN 1/2X4 (GAUZE/BANDAGES/DRESSINGS) ×2 IMPLANT
SUT ETHILON 2 0 FS 18 (SUTURE) IMPLANT
SUT MNCRL AB 4-0 PS2 18 (SUTURE) ×4 IMPLANT
SUT MON AB 5-0 PS2 18 (SUTURE) IMPLANT
SUT SILK 2 0 SH (SUTURE) ×2 IMPLANT
SUT VIC AB 2-0 SH 27 (SUTURE) ×8
SUT VIC AB 2-0 SH 27XBRD (SUTURE) ×4 IMPLANT
SUT VIC AB 3-0 SH 27 (SUTURE) ×6
SUT VIC AB 3-0 SH 27X BRD (SUTURE) ×3 IMPLANT
SUT VIC AB 5-0 PS2 18 (SUTURE) IMPLANT
SYR CONTROL 10ML LL (SYRINGE) ×2 IMPLANT
TOWEL GREEN STERILE FF (TOWEL DISPOSABLE) ×2 IMPLANT
TRACER MAGTRACE VIAL (MISCELLANEOUS) ×2 IMPLANT
TRAY FAXITRON CT DISP (TRAY / TRAY PROCEDURE) ×2 IMPLANT
TUBE CONNECTING 20X1/4 (TUBING) ×2 IMPLANT
YANKAUER SUCT BULB TIP NO VENT (SUCTIONS) ×2 IMPLANT

## 2020-12-11 NOTE — Progress Notes (Signed)
Assisted Dr. Jillyn Hidden with right, ultrasound guided, pectoralis block. Side rails up, monitors on throughout procedure. See vital signs in flow sheet. Tolerated Procedure well.

## 2020-12-11 NOTE — Discharge Instructions (Addendum)
[facility] Office Phone Number [phone]  BREAST BIOPSY/ PARTIAL MASTECTOMY: POST OP INSTRUCTIONS Take 400 mg of ibuprofen every 8 hours or 650 mg tylenol every 6 hours for next 72 hours then as needed. Use ice several times daily also. Always review your discharge instruction sheet given to you by the facility where your surgery was performed.  IF YOU HAVE DISABILITY OR FAMILY LEAVE FORMS, YOU MUST BRING THEM TO THE OFFICE FOR PROCESSING.  DO NOT GIVE THEM TO YOUR DOCTOR.  A prescription for pain medication may be given to you upon discharge.  Take your pain medication as prescribed, if needed.  If narcotic pain medicine is not needed, then you may take acetaminophen (Tylenol), naprosyn (Alleve) or ibuprofen (Advil) as needed. Take your usually prescribed medications unless otherwise directed If you need a refill on your pain medication, please contact your pharmacy.  They will contact our office to request authorization.  Prescriptions will not be filled after 5pm or on week-ends. You should eat very light the first 24 hours after surgery, such as soup, crackers, pudding, etc.  Resume your normal diet the day after surgery. Most patients will experience some swelling and bruising in the breast.  Ice packs and a good support bra will help.  Wear the breast binder provided or a sports bra for 72 hours day and night.  After that wear a sports bra during the day until you return to the office. Swelling and bruising can take several days to resolve.  It is common to experience some constipation if taking pain medication after surgery.  Increasing fluid intake and taking a stool softener will usually help or prevent this problem from occurring.  A mild laxative (Milk of Magnesia or Miralax) should be taken according to package directions if there are no bowel movements after 48 hours. Unless discharge instructions indicate otherwise, you may remove your bandages 48 hours after surgery  and you may shower at that time.  You may have steri-strips (small skin tapes) in place directly over the incision.  These strips should be left on the skin for 7-10 days and will come off on their own.  If your surgeon used skin glue on the incision, you may shower in 24 hours.  The glue will flake off over the next 2-3 weeks.  Any sutures or staples will be removed at the office during your follow-up visit. ACTIVITIES:  You may resume regular daily activities (gradually increasing) beginning the next day.  Wearing a good support bra or sports bra minimizes pain and swelling.  You may have sexual intercourse when it is comfortable. You may drive when you no longer are taking prescription pain medication, you can comfortably wear a seatbelt, and you can safely maneuver your car and apply brakes. RETURN TO WORK:  ______________________________________________________________________________________ You should see your doctor in the office for a follow-up appointment approximately two weeks after your surgery.  Your doctor's nurse will typically make your follow-up appointment when she calls you with your pathology report.  Expect your pathology report 3-4 business days after your surgery.  You may call to check if you do not hear from us after three days. OTHER INSTRUCTIONS: _______________________________________________________________________________________________ _____________________________________________________________________________________________________________________________________ _____________________________________________________________________________________________________________________________________ _____________________________________________________________________________________________________________________________________  WHEN TO CALL DR WAKEFIELD: Fever over 101.0 Nausea and/or vomiting. Extreme swelling or bruising. Continued bleeding from incision. Increased  pain, redness, or drainage from the incision.  The clinic staff is available to answer your questions during regular business hours.  Please don't hesitate to call   and ask to speak to one of the nurses for clinical concerns.  If you have a medical emergency, go to the nearest emergency room or call 911.  A surgeon from [facility] is always on call at the hospital.  For further questions, please visit [email or web address]  Post Anesthesia Home Care Instructions  Activity: Get plenty of rest for the remainder of the day. A responsible individual must stay with you for 24 hours following the procedure.  For the next 24 hours, DO NOT: -Drive a car -Paediatric nurse -Drink alcoholic beverages -Take any medication unless instructed by your physician -Make any legal decisions or sign important papers.  Meals: Start with liquid foods such as gelatin or soup. Progress to regular foods as tolerated. Avoid greasy, spicy, heavy foods. If nausea and/or vomiting occur, drink only clear liquids until the nausea and/or vomiting subsides. Call your physician if vomiting continues.  Special Instructions/Symptoms: Your throat may feel dry or sore from the anesthesia or the breathing tube placed in your throat during surgery. If this causes discomfort, gargle with warm salt water. The discomfort should disappear within 24 hours.  If you had a scopolamine patch placed behind your ear for the management of post- operative nausea and/or vomiting:  1. The medication in the patch is effective for 72 hours, after which it should be removed.  Wrap patch in a tissue and discard in the trash. Wash hands thoroughly with soap and water. 2. You may remove the patch earlier than 72 hours if you experience unpleasant side effects which may include dry mouth, dizziness or visual disturbances. 3. Avoid touching the patch. Wash your hands with soap and water after contact with the patch.     No  tylenol until after 7:30pm tonight. No ibuprofen/motrin until after 9:30pm tonight.

## 2020-12-11 NOTE — Transfer of Care (Signed)
Immediate Anesthesia Transfer of Care Note  Patient: Sandy Goodwin  Procedure(s) Performed: RIGHT BREAST LUMPECTOMY WITH RADIOACTIVE SEED X2 AND RIGHT AXILLARY SENTINEL LYMPH NODE BIOPSY (Right: Breast)  Patient Location: PACU  Anesthesia Type:GA combined with regional for post-op pain  Level of Consciousness: drowsy  Airway & Oxygen Therapy: Patient Spontanous Breathing and Patient connected to face mask oxygen  Post-op Assessment: Report given to RN and Post -op Vital signs reviewed and stable  Post vital signs: Reviewed and stable  Last Vitals:  Vitals Value Taken Time  BP 105/65 12/11/20 1640  Temp    Pulse 72 12/11/20 1642  Resp 12 12/11/20 1642  SpO2 99 % 12/11/20 1642  Vitals shown include unvalidated device data.  Last Pain:  Vitals:   12/11/20 1314  TempSrc: Oral  PainSc: 0-No pain      Patients Stated Pain Goal: 7 ([id number])  Complications: No notable events documented.

## 2020-12-11 NOTE — Anesthesia Postprocedure Evaluation (Signed)
Anesthesia Post Note  Patient: Sandy Goodwin  Procedure(s) Performed: RIGHT BREAST LUMPECTOMY WITH RADIOACTIVE SEED X2 AND RIGHT AXILLARY SENTINEL LYMPH NODE BIOPSY (Right: Breast)     Patient location during evaluation: PACU Anesthesia Type: General Level of consciousness: awake and sedated Pain management: pain level controlled Vital Signs Assessment: post-procedure vital signs reviewed and stable Respiratory status: spontaneous breathing Cardiovascular status: stable Postop Assessment: no apparent nausea or vomiting Anesthetic complications: no   No notable events documented.  Last Vitals:  Vitals:   12/11/20 1641 12/11/20 1645  BP: 105/65 102/62  Pulse: 69 71  Resp: 10 13  Temp: 36.8 C   SpO2: 99% 99%    Last Pain:  Vitals:   12/11/20 1641  TempSrc:   PainSc: [name]

## 2020-12-11 NOTE — H&P (Signed)
50 y.o. female who is seen today for follow up breast cancer.  I saw her previously after sheunderwent screening mm that showed left distortion (this went away on later imaging) and right sided calcs.  she has fh of bca in her mom, maternal aunt and a ggm.  she was noted to have 4.1 cm of uoq calcifications.  ax u/s negative. she has had mri that shows 4.1 cm of nme and no other abnormalities.  she had biopsy anterior and posterior calcs and those clips are 2.7 cm apart.  biopsy of anterior is hg dcis with likely microinvasion. 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     she previously had negative genetic testing recently and does not need it updated She has overall done very well with chemotherapy. She is due 1 additional infusion this Friday. She is due to get her MRI on the sixth. I will follow-up with her after that to confirm our plan.  Review of Systems: A complete review of systems was obtained from the patient. I have reviewed this information and discussed as appropriate with the patient. See HPI as well for other ROS.  Review of Systems  All other systems reviewed and are negative.   Medical History: Past Medical History:  Diagnosis Date   Anemia   Arthritis   GERD (gastroesophageal reflux disease)   History of cancer   Thyroid disease   There is no problem list on file for this patient.  Past Surgical History:  Procedure Laterality Date   COMBINED HYSTEROSCOPY DIAGNOSTIC / D&C   HYSTERECTOMY    Allergies  Allergen Reactions   Codeine Other (See Comments)  Makes pt agittated and stay awake  Makes pt agittated and stay awake  Makes pt agittated and stay awake    Hydrocodone Other (See Comments)  Makes pt agittated and stay awake  Makes pt agittated and stay awake  Makes pt agittated and stay awake    Current Outpatient Medications on File Prior to Visit  Medication Sig Dispense Refill   cetirizine  (ZYRTEC) 1 mg/mL oral solution Take by mouth once daily   dexAMETHasone (MAXIDEX) 0.1 % ophthalmic suspension 1 drop every 6 (six) hours   doxycycline calcium (VIBRAMYCIN) 50 mg/5 mL syrup Take 50 mg by mouth once daily   levothyroxine (SYNTHROID) 100 MCG tablet Take 100 mcg by mouth once daily Take on an empty stomach with a glass of water at least 30-60 minutes before breakfast.   LORazepam (ATIVAN) 0.5 MG tablet TAKE 1 TABLET BY MOUTH AT BEDTIME AS NEEDED FOR NAUSEA   nebivoloL (BYSTOLIC) 10 MG tablet Take 10 mg by mouth once daily   ondansetron (ZOFRAN) 2 mg/mL injection Inject 4 mg into the vein every 8 (eight) hours as needed for Nausea   prednisoLONE sodium phosphate (ORAPRED ODT) 15 MG disintegrating tablet Take 15 mg by mouth once daily   prochlorperazine (COMPRO) 25 MG suppository Place 25 mg rectally every 12 (twelve) hours as needed for Nausea   No current facility-administered medications on file prior to visit.   Family History  Problem Relation Age of Onset   Obesity Mother   High blood pressure (Hypertension) Mother   Hyperlipidemia (Elevated cholesterol) Mother   Diabetes Mother   Breast cancer Mother   Coronary Artery Disease (Blocked arteries around heart) Maternal Grandfather    Social History   Tobacco Use  Smoking Status Never  Smoker  Smokeless Tobacco Never Used    Social History   Socioeconomic History   Marital status: Married  Tobacco Use   Smoking status: Never Smoker   Smokeless tobacco: Never Used  Substance and Sexual Activity   Alcohol use: Yes  Comment: rarelt, a few times per year.   Drug use: Never   Objective:   Vitals:  11/11/20 1029  BP: 136/76  Pulse: 78  Temp: 36.4 C (97.6 F)  SpO2: 100%  Weight: 92.2 kg (203 lb 3.2 oz)  Height: 168.9 cm (5' 6.5")   Body mass index is 32.31 kg/m.  Physical Exam Chest:  Breasts:  Right: No mass or nipple discharge.  Left: No mass or nipple discharge.   Comments: Left sided port in  place Lymphadenopathy:  Upper Body:  Right upper body: No supraclavicular or axillary adenopathy.  Left upper body: No supraclavicular or axillary adenopathy.  Neurological:  Mental Status: She is alert.     Assessment and Plan:  Left breast cancer s/p tchp  Left breast seed bracketed lumpectomy and sn biopsy with plastics reduction a week later if margins are negative.  MR shows complete response. I do think that based on follow-up information that she will be a candidate for a seed bracketed lumpectomy. We again discussed the performance of mastectomy versus a lumpectomy in her case. She would like to proceed with lumpectomy. She understands risk 5 to 10% risk of a positive margin necessitating a second surgery. She is also going to undergo a reduction which we are going to plan a week after surgery to ensure she has positive margins first. We discussed a sentinel lymph node biopsy as she does not appear to having lymph node involvement right now. We discussed the performance of that with injection of radioactive tracer. We discussed that there is a chance of having a positive node with a sentinel lymph node biopsy and we will await the permanent pathology to make any other first further decisions in terms of her treatment. We discussed up to a 5% risk lifetime of chronic shoulder pain as well as lymphedema associated with a sentinel lymph node biopsy. We discussed the risks of operation including bleeding, infection, possible reoperation. She understands her further therapy will be based on what her stages at the time of her operation.

## 2020-12-11 NOTE — Anesthesia Preprocedure Evaluation (Signed)
Anesthesia Evaluation  Patient identified by MRN, date of birth, ID band Patient awake    Reviewed: Allergy & Precautions, NPO status , Patient's Chart, lab work & pertinent test results  History of Anesthesia Complications (+) history of anesthetic complications  Airway Mallampati: I       Dental no notable dental hx. (+) Teeth Intact   Pulmonary neg pulmonary ROS,    Pulmonary exam normal        Cardiovascular Normal cardiovascular exam+ dysrhythmias Supra Ventricular Tachycardia      Neuro/Psych negative neurological ROS  negative psych ROS   GI/Hepatic negative GI ROS, Neg liver ROS,   Endo/Other  Hypothyroidism   Renal/GU negative Renal ROS  negative genitourinary   Musculoskeletal   Abdominal (+) + obese,   Peds  Hematology negative hematology ROS (+)   Anesthesia Other Findings   Reproductive/Obstetrics                             Anesthesia Physical  Anesthesia Plan  ASA: II  Anesthesia Plan: General   Post-op Pain Management:  Regional for Post-op pain   Induction: Intravenous  PONV Risk Score and Plan: 3 and Ondansetron, Dexamethasone and Midazolam  Airway Management Planned: LMA  Additional Equipment: None  Intra-op Plan:   Post-operative Plan: Extubation in OR  Informed Consent: I have reviewed the patients History and Physical, chart, labs and discussed the procedure including the risks, benefits and alternatives for the proposed anesthesia with the patient or authorized representative who has indicated his/her understanding and acceptance.     Dental advisory given  Plan Discussed with: CRNA  Anesthesia Plan Comments:         Anesthesia Quick Evaluation

## 2020-12-11 NOTE — Anesthesia Procedure Notes (Signed)
Procedure Name: LMA Insertion Date/Time: 12/11/2020 3:17 PM Performed by: Lavonia Dana, CRNA Pre-anesthesia Checklist: Patient identified, Emergency Drugs available, Suction available and Patient being monitored Patient Re-evaluated:Patient Re-evaluated prior to induction Oxygen Delivery Method: Circle system utilized Preoxygenation: Pre-oxygenation with 100% oxygen Induction Type: IV induction Ventilation: Mask ventilation without difficulty LMA: LMA inserted LMA Size: 4.0 Number of attempts: 1 Airway Equipment and Method: Bite block Placement Confirmation: positive ETCO2 Tube secured with: Tape Dental Injury: Teeth and Oropharynx as per pre-operative assessment

## 2020-12-11 NOTE — Op Note (Signed)
Preoperative diagnosis: Right breast cancer status post primary chemotherapy Postoperative diagnosis: Same as above Procedure: 1.  Right breast radioactive seed bracketed lumpectomy 2.  Right deep axillary sentinel lymph node biopsy 3.  Injection of mag trace for sentinel lymph node identification Surgeon: Dr. Serita Grammes Anesthesia: General with pectoral block Estimated blood loss: 30 cc Specimens: 1.  Right breast tissue containing 2 clips and 2 seeds marked with paint 2.  Additional posterior and medial margins marked short superior long lateral double deep 3.  Right deep axillary sentinel lymph nodes with highest count of mag trace being 2595 Complications: None Drains: None Special count was correct completion Disposition to recovery stable condition  Indications: This is a 50 year old female who had undergone screening mammogram that showed right-sided calcifications.  She had a 4.1 cm area of upper outer quadrant calcifications.  Her axillary ultrasound was negative.  MRI confirmed this.  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  She has undergone primary systemic therapy with a complete MRI response.  We discussed her options and elected to proceed with a lumpectomy bracketing this was seeds as well as a sentinel lymph node biopsy.  She has a reduction scheduled for a week later.  Procedure: After informed consent was obtained the patient first underwent injection of mag trace by me.  I cleansed the area around the areola.  I infiltrated lidocaine.  I then injected 2 cc of mag trace in the subareolar position without difficulty.  She then underwent injection of Lymphoseek as well as a pectoral block.  She was taken to the operating room placed under general anesthesia.  She was  then prepped and draped in the standard sterile surgical fashion.  A surgical timeout was then performed.  I first did the lumpectomy.  She had been previously marked by Dr. Iran Planas of plastic surgery.  I used 1 of these lines and made an incision.  I then used the neoprobe to locate the seeds.  I then removed both seeds with some rim of normal tissue around them.  There was some fibrous tissue that I removed as well.  Mammogram confirmed removal of the clips and the seeds.  I did remove additional medial and posterior margin just because I thought these were close on the 3D images.  I obtained hemostasis.  This was closed with 2-0 Vicryl, 3-0 Vicryl, and 4-0 Monocryl.  Glue and Steri-Strips were later applied.  I then identified the sentinel node using the probe.  I made an incision in the low axilla after filtrated Marcaine.  I carried to the axillary fascia.  Using the symptomatic probe I then was able to identify what looked to be 2 or 3 sentinel lymph nodes.  The highest count was 2700.  The neoprobe confirmed this with the Lymphoseek.  There was no background radioactivity or activity from the mag trace either.  I then obtained hemostasis.  I closed this with 2-0 Vicryl, 3-0 Vicryl, and 4-0 Monocryl.  Glue and Steri-Strips were applied.  She tolerated all this well was extubated and transferred to recovery stable.

## 2020-12-11 NOTE — Progress Notes (Signed)
Nuc med at bedside for right breast injections.  Pt tolerated well, and VSS.

## 2020-12-11 NOTE — Anesthesia Procedure Notes (Signed)
Anesthesia Regional Block: Pectoralis block   Pre-Anesthetic Checklist: , timeout performed,  Correct Patient, Correct Site, Correct Laterality,  Correct Procedure, Correct Position, site marked,  Risks and benefits discussed,  Surgical consent,  Pre-op evaluation,  At surgeon's request and post-op pain management  Laterality: Right and N/A  Prep: chloraprep       Needles:  Injection technique: Single-shot  Needle Type: Echogenic Stimulator Needle     Needle Length: 9cm  Needle Gauge: 20   Needle insertion depth: 2 cm   Additional Needles:   Procedures:,,,, ultrasound used (permanent image in chart),,    Narrative:  Start time: 12/11/2020 2:27 PM End time: 12/11/2020 2:35 PM Injection made incrementally with aspirations every 5 mL.  Performed by: Personally  Anesthesiologist: Lyn Hollingshead, MD

## 2020-12-11 NOTE — Interval H&P Note (Signed)
History and Physical Interval Note:  12/11/2020 2:49 PM  Sandy Goodwin  has presented today for surgery, with the diagnosis of RIGHT BREAST CANCER.  The various methods of treatment have been discussed with the patient and family. After consideration of risks, benefits and other options for treatment, the patient has consented to  Procedure(s): RIGHT BREAST LUMPECTOMY WITH RADIOACTIVE SEED X2 AND RIGHT AXILLARY SENTINEL LYMPH NODE BIOPSY (Right) as a surgical intervention.  The patient's history has been reviewed, patient examined, no change in status, stable for surgery.  I have reviewed the patient's chart and labs.  Questions were answered to the patient's satisfaction.     Rolm Bookbinder

## 2020-12-11 NOTE — Progress Notes (Signed)
Dr. [name] at bedside to place Magtrace in right breast.  Pt tolerated well and VSS.

## 2020-12-13 ENCOUNTER — Inpatient Hospital Stay: Payer: BC Managed Care – PPO | Attending: Hematology and Oncology

## 2020-12-13 ENCOUNTER — Other Ambulatory Visit: Payer: Self-pay

## 2020-12-13 ENCOUNTER — Encounter (HOSPITAL_BASED_OUTPATIENT_CLINIC_OR_DEPARTMENT_OTHER): Payer: Self-pay | Admitting: General Surgery

## 2020-12-13 DIAGNOSIS — Z170 Estrogen receptor positive status [ER+]: Secondary | ICD-10-CM | POA: Insufficient documentation

## 2020-12-13 DIAGNOSIS — Z5112 Encounter for antineoplastic immunotherapy: Secondary | ICD-10-CM | POA: Insufficient documentation

## 2020-12-13 DIAGNOSIS — C50411 Malignant neoplasm of upper-outer quadrant of right female breast: Secondary | ICD-10-CM | POA: Diagnosis present

## 2020-12-13 MED ORDER — SODIUM CHLORIDE 0.9 % IV SOLN: INTRAVENOUS | Status: DC

## 2020-12-13 NOTE — Patient Instructions (Signed)

## 2020-12-17 ENCOUNTER — Encounter: Payer: Self-pay | Admitting: *Deleted

## 2020-12-17 ENCOUNTER — Other Ambulatory Visit: Payer: Self-pay

## 2020-12-17 ENCOUNTER — Encounter (HOSPITAL_BASED_OUTPATIENT_CLINIC_OR_DEPARTMENT_OTHER): Payer: Self-pay | Admitting: Plastic Surgery

## 2020-12-18 NOTE — Progress Notes (Signed)
Patient Care Team: Caryl Bis, MD as PCP - General (Family Medicine) Mauro Kaufmann, RN as Oncology Nurse Navigator Rockwell Germany, RN as Oncology Nurse Navigator Rolm Bookbinder, MD as Consulting Physician (General Surgery) Nicholas Lose, MD as Consulting Physician (Hematology and Oncology) Kyung Rudd, MD as Consulting Physician (Radiation Oncology)  DIAGNOSIS:    ICD-10-CM   1. Malignant neoplasm of upper-outer quadrant of right breast in female, estrogen receptor positive ([place])  C50.411    Z17.0       SUMMARY OF ONCOLOGIC HISTORY: Oncology History  Malignant neoplasm of upper-outer quadrant of right breast in female, estrogen receptor positive ([place])  07/08/2020 Initial Diagnosis   Screening mammogram showed right breast calcifications. Diagnostic mammogram showed right breast calcifications spanning 4.1cm and no abnormal right axillary lymph nodes. 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   07/24/2020 - 11/15/2020 Neo-Adjuvant Chemotherapy   Taxotere, Carbo, Herceptin, Perjeta given every three weeks x 6   12/05/2020 -  Chemotherapy   Maintenance Herceptin/Perjeta every three weeks for one year       12/11/2020 Surgery   Right breast lumpectomy [name]): Scattered foci of high-grade DCIS with calcifications, no invasive cancer, pathologic complete response, margins negative, 0/3 lymph nodes negative, previously ER 80%, PR 40%, HER2 positive, Ki-67 25%   12/20/2020 Surgery   Reconstruction with Dr. Iran Planas     CHIEF COMPLIANT:  Herceptin and Perjeta maintenance  INTERVAL HISTORY: ASHE GRAYBEAL is a 50 y.o. with above-mentioned history of breast cancer currently on chemotherapy with Herceptin and Perjeta. She presents to the clinic today for maintenance Herceptin and Perjeta.  She is recovering very well from the recent surgery.  We are excited and happy that she had a complete  pathologic response.  ALLERGIES:  is allergic to codeine and hydrocodone.  MEDICATIONS:  Current Outpatient Medications  Medication Sig Dispense Refill   BYSTOLIC 5 MG tablet Take 1 tablet by mouth daily.  1   cetirizine (ZYRTEC) 10 MG tablet Take 10 mg by mouth daily.     clobetasol ointment (TEMOVATE) 0.05 % Apply topically 2 (two) times daily.     doxycycline (VIBRAMYCIN) 50 MG capsule Take 50 mg by mouth daily.     levothyroxine (SYNTHROID, LEVOTHROID) 50 MCG tablet Take 50 mcg by mouth daily before breakfast.     lidocaine (XYLOCAINE) 2 % solution 5 ml swish and spit q 3 hours prn mouth pain 200 mL 2   lidocaine-prilocaine (EMLA) cream APPLY TO THE AFFECTED AREA(S) DAILY 30 g 0   LORazepam (ATIVAN) 0.5 MG tablet Take 1 tablet (0.$RemoveBef'5mg'PDgPCVfyYW$  tablet) at bedtime, as needed for nausea. 30 tablet 0   magic mouthwash (nystatin, diphenhydrAMINE, alum & mag hydroxide) suspension mixture Swish and spit 5 mLs 4 (four) times daily as needed for mouth pain. 240 mL 3   meloxicam (MOBIC) 15 MG tablet Take 15 mg by mouth daily.     metroNIDAZOLE (METROCREAM) 0.75 % cream Apply 1 application topically 2 (two) times daily.     phenazopyridine (PYRIDIUM) 200 MG tablet Take 1 tablet (200 mg total) by mouth 3 (three) times daily as needed for pain. 21 tablet 1   prochlorperazine (COMPAZINE) 10 MG tablet TAKE 1 TABLET BY MOUTH EVERY 6 HOURS AS NEEDED FOR NAUSEA OR FOR VOMITING 30 tablet 1   traMADol (ULTRAM) 50 MG tablet Take 1 tablet (50 mg total) by mouth every 6 (six) hours as needed.  30 tablet 0   traMADol (ULTRAM) 50 MG tablet Take 2 tablets (100 mg total) by mouth every 6 (six) hours as needed. 10 tablet 0   triamcinolone (NASACORT) 55 MCG/ACT AERO nasal inhaler Place 2 sprays into the nose daily.     Turmeric 500 MG CAPS Take by mouth.     valACYclovir (VALTREX) 1000 MG tablet Take 1 tablet (1,000 mg total) by mouth 2 (two) times daily. 14 tablet 2   No current facility-administered medications for this  visit.    PHYSICAL EXAMINATION: ECOG PERFORMANCE STATUS: 1 - Symptomatic but completely ambulatory  Vitals:   12/19/20 0848  BP: 137/76  Pulse: 75  Resp: 18  Temp: 97.7 F (36.5 C)  SpO2: 99%   Filed Weights   12/19/20 0848  Weight: 201 lb 4.8 oz (91.3 kg)    LABORATORY DATA:  I have reviewed the data as listed CMP Latest Ref Rng & Units 12/06/2020 11/15/2020 10/25/2020  Glucose 70 - 99 mg/dL 114(H) 91 87  BUN 6 - 20 mg/dL $Remove'16 17 17  'YhkCPju$ Creatinine 0.44 - 1.00 mg/dL 0.79 0.73 0.77  Sodium 135 - 145 mmol/L 140 141 140  Potassium 3.5 - 5.1 mmol/L 4.3 3.7 3.7  Chloride 98 - 111 mmol/L 106 107 104  CO2 22 - 32 mmol/L $RemoveB'25 25 26  'iebIkkRZ$ Calcium 8.9 - 10.3 mg/dL 8.9 9.0 9.2  Total Protein 6.5 - 8.1 g/dL 6.9 6.8 7.2  Total Bilirubin 0.3 - 1.2 mg/dL 0.3 0.3 0.4  Alkaline Phos 38 - 126 U/L 108 103 101  AST 15 - 41 U/L $Remo'20 22 21  'GlNJh$ ALT 0 - 44 U/L 30 32 27    Lab Results  Component Value Date   WBC 4.4 12/06/2020   HGB 10.0 (L) 12/06/2020   HCT 29.7 (L) 12/06/2020   MCV 98.3 12/06/2020   PLT 141 (L) 12/06/2020   NEUTROABS 2.2 12/06/2020    ASSESSMENT & PLAN:  Malignant neoplasm of upper-outer quadrant of right breast in female, estrogen receptor positive ([place]) 07/08/2020: Screening mammogram showed right breast calcifications. Diagnostic mammogram showed right breast calcifications spanning 4.1cm and no abnormal right axillary lymph nodes. 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 Genetics done in 2019: Negative   Treatment plan: 1. Neoadjuvant chemotherapy with TCH Perjeta 6 cycles completed 11/15/2020 followed by Herceptin Perjeta maintenance for 1 year 2. right lumpectomy: 12/11/2020: Pathologic complete response, 0/3 lymph nodes negative, residual high-grade DCIS 3. Followed by adjuvant radiation therapy  4.  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 URCC nausea study ---------------------------------------------------------------------------------------------------------------------------------------  12/11/2020:Right breast lumpectomy [name]): Scattered foci of high-grade DCIS with calcifications, no invasive cancer, pathologic complete response, margins negative, 0/3 lymph nodes negative, previously ER 80%, PR 40%, HER2 positive, Ki-67 25%  Pathology counseling: I discussed the final pathology report of the patient provided  a copy of this report. I discussed the margins as well as lymph node surgeries. We also discussed the final staging along with previously performed ER/PR and HER-2/neu testing.  Continue with Herceptin Perjeta every 3 weeks. Tomorrow she has another surgery with Dr. Iran Planas, breast reduction and oncoplastic surgery Will request radiation oncology consultation Follow-up with me in 6 weeks    No orders of the defined types were placed in this encounter.  The patient has a good understanding of the overall plan. she agrees with it.  she will call with any problems that may develop before the next visit here.  Total time spent: 30 mins including face to face time and time spent for planning, charting and coordination of care  Rulon Eisenmenger, MD, MPH 12/19/2020  I, Thana Ates, am acting as scribe for Dr. Nicholas Lose.  I have reviewed the above documentation for accuracy and completeness, and I agree with the above.

## 2020-12-19 ENCOUNTER — Inpatient Hospital Stay (HOSPITAL_BASED_OUTPATIENT_CLINIC_OR_DEPARTMENT_OTHER): Payer: BC Managed Care – PPO | Admitting: Hematology and Oncology

## 2020-12-19 ENCOUNTER — Encounter: Payer: Self-pay | Admitting: *Deleted

## 2020-12-19 ENCOUNTER — Other Ambulatory Visit: Payer: Self-pay

## 2020-12-19 DIAGNOSIS — Z5112 Encounter for antineoplastic immunotherapy: Secondary | ICD-10-CM | POA: Diagnosis not present

## 2020-12-19 DIAGNOSIS — Z170 Estrogen receptor positive status [ER+]: Secondary | ICD-10-CM

## 2020-12-19 DIAGNOSIS — C50411 Malignant neoplasm of upper-outer quadrant of right female breast: Secondary | ICD-10-CM | POA: Diagnosis not present

## 2020-12-19 NOTE — Assessment & Plan Note (Addendum)
07/08/2020:Screening mammogram showed right breast calcifications. Diagnostic mammogram showed right breast calcifications spanning 4.1cm and no abnormal right axillary lymph nodes. 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 Genetics done in 2019: Negative  Treatment plan: 1. Neoadjuvant chemotherapy with TCH Perjeta 6 cycles completed 11/15/2020 followed by Herceptin Perjeta maintenance for 1 year 2. right lumpectomy: 12/11/2020: Pathologic complete response, 0/3 lymph nodes negative, residual high-grade DCIS 3. Followed by adjuvant radiation therapy  4.  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 URCCnausea study ---------------------------------------------------------------------------------------------------------------------------------------  12/11/2020:Right breast lumpectomy [name]): Scattered foci of high-grade DCIS with calcifications, no invasive cancer, pathologic complete response, margins negative, 0/3 lymph nodes negative, previously ER 80%, PR 40%, HER2 positive, Ki-67 25%  Pathology counseling: I discussed the final pathology report of the patient provided  a copy of this report. I discussed the margins as well as lymph node surgeries. We also discussed the final staging along with previously performed ER/PR and HER-2/neu testing.  Continue with Herceptin Perjeta every 3 weeks. Follow-up with me in 6 weeks

## 2020-12-20 ENCOUNTER — Ambulatory Visit (HOSPITAL_BASED_OUTPATIENT_CLINIC_OR_DEPARTMENT_OTHER): Payer: BC Managed Care – PPO | Admitting: Anesthesiology

## 2020-12-20 ENCOUNTER — Encounter (HOSPITAL_BASED_OUTPATIENT_CLINIC_OR_DEPARTMENT_OTHER)
Admission: RE | Disposition: A | Discharge status: Home / Self Care | Payer: Self-pay | Source: Home / Self Care | Attending: Plastic Surgery

## 2020-12-20 ENCOUNTER — Ambulatory Visit (HOSPITAL_BASED_OUTPATIENT_CLINIC_OR_DEPARTMENT_OTHER)
Admission: RE | Admit: 2020-12-20 | Discharge: 2020-12-20 | Disposition: A | Discharge status: Home / Self Care | Payer: BC Managed Care – PPO | Attending: Plastic Surgery | Admitting: Plastic Surgery

## 2020-12-20 ENCOUNTER — Other Ambulatory Visit: Payer: Self-pay

## 2020-12-20 ENCOUNTER — Encounter (HOSPITAL_BASED_OUTPATIENT_CLINIC_OR_DEPARTMENT_OTHER): Payer: Self-pay | Admitting: Plastic Surgery

## 2020-12-20 DIAGNOSIS — Z803 Family history of malignant neoplasm of breast: Secondary | ICD-10-CM | POA: Insufficient documentation

## 2020-12-20 DIAGNOSIS — Z9221 Personal history of antineoplastic chemotherapy: Secondary | ICD-10-CM | POA: Insufficient documentation

## 2020-12-20 DIAGNOSIS — Z421 Encounter for breast reconstruction following mastectomy: Secondary | ICD-10-CM | POA: Insufficient documentation

## 2020-12-20 DIAGNOSIS — Z853 Personal history of malignant neoplasm of breast: Secondary | ICD-10-CM | POA: Insufficient documentation

## 2020-12-20 HISTORY — PX: BREAST RECONSTRUCTION: SHX9

## 2020-12-20 HISTORY — PX: BREAST REDUCTION SURGERY: SHX8

## 2020-12-20 LAB — SURGICAL PATHOLOGY

## 2020-12-20 SURGERY — RECONSTRUCTION, BREAST
Anesthesia: General | Site: Breast | Laterality: Right

## 2020-12-20 MED ORDER — PHENYLEPHRINE 40 MCG/ML (10ML) SYRINGE FOR IV PUSH (FOR BLOOD PRESSURE SUPPORT)
PREFILLED_SYRINGE | INTRAVENOUS | Status: AC
  Filled 2020-12-20: qty 10

## 2020-12-20 MED ORDER — TRAMADOL HCL 50 MG PO TABS: 50.0000 mg | ORAL_TABLET | Freq: Four times a day (QID) | ORAL | 0 refills | Status: DC | PRN

## 2020-12-20 MED ORDER — LACTATED RINGERS IV SOLN: INTRAVENOUS | Status: DC

## 2020-12-20 MED ORDER — MIDAZOLAM HCL 5 MG/5ML IJ SOLN
INTRAMUSCULAR | Status: DC | PRN
  Administered 2020-12-20: 2 mg via INTRAVENOUS

## 2020-12-20 MED ORDER — CHLORHEXIDINE GLUCONATE CLOTH 2 % EX PADS: 6.0000 | MEDICATED_PAD | Freq: Once | CUTANEOUS | Status: DC

## 2020-12-20 MED ORDER — FENTANYL CITRATE (PF) 100 MCG/2ML IJ SOLN
INTRAMUSCULAR | Status: AC
  Filled 2020-12-20: qty 2

## 2020-12-20 MED ORDER — ROCURONIUM BROMIDE 100 MG/10ML IV SOLN
INTRAVENOUS | Status: DC | PRN
Start: 2020-12-20 — End: 2020-12-20
  Administered 2020-12-20: 100 mg via INTRAVENOUS

## 2020-12-20 MED ORDER — MIDAZOLAM HCL 2 MG/2ML IJ SOLN
INTRAMUSCULAR | Status: AC
  Filled 2020-12-20: qty 2

## 2020-12-20 MED ORDER — ONDANSETRON HCL 4 MG/2ML IJ SOLN
INTRAMUSCULAR | Status: DC | PRN
  Administered 2020-12-20: 4 mg via INTRAVENOUS

## 2020-12-20 MED ORDER — DEXAMETHASONE SODIUM PHOSPHATE 10 MG/ML IJ SOLN
INTRAMUSCULAR | Status: AC
  Filled 2020-12-20: qty 1

## 2020-12-20 MED ORDER — KETAMINE HCL 10 MG/ML IJ SOLN
INTRAMUSCULAR | Status: DC | PRN
  Administered 2020-12-20: 10 mg via INTRAVENOUS
  Administered 2020-12-20: 30 mg via INTRAVENOUS
  Administered 2020-12-20: 10 mg via INTRAVENOUS

## 2020-12-20 MED ORDER — HYDROMORPHONE HCL 1 MG/ML IJ SOLN
INTRAMUSCULAR | Status: AC
  Filled 2020-12-20: qty 0.5

## 2020-12-20 MED ORDER — SUCCINYLCHOLINE CHLORIDE 200 MG/10ML IV SOSY
PREFILLED_SYRINGE | INTRAVENOUS | Status: AC
  Filled 2020-12-20: qty 10

## 2020-12-20 MED ORDER — GABAPENTIN 300 MG PO CAPS
300.0000 mg | ORAL_CAPSULE | ORAL | Status: AC
  Administered 2020-12-20: 300 mg via ORAL

## 2020-12-20 MED ORDER — OXYCODONE HCL 5 MG PO TABS
ORAL_TABLET | ORAL | Status: AC
  Filled 2020-12-20: qty 2

## 2020-12-20 MED ORDER — FENTANYL CITRATE (PF) 100 MCG/2ML IJ SOLN
25.0000 ug | INTRAMUSCULAR | Status: DC | PRN
  Administered 2020-12-20 (×2): 50 ug via INTRAVENOUS

## 2020-12-20 MED ORDER — 0.9 % SODIUM CHLORIDE (POUR BTL) OPTIME
TOPICAL | Status: DC | PRN
  Administered 2020-12-20: 200 mL

## 2020-12-20 MED ORDER — PROPOFOL 10 MG/ML IV BOLUS
INTRAVENOUS | Status: DC | PRN
  Administered 2020-12-20: 150 mg via INTRAVENOUS

## 2020-12-20 MED ORDER — BUPIVACAINE HCL (PF) 0.5 % IJ SOLN
INTRAMUSCULAR | Status: AC
  Filled 2020-12-20: qty 30

## 2020-12-20 MED ORDER — CELECOXIB 200 MG PO CAPS
200.0000 mg | ORAL_CAPSULE | ORAL | Status: AC
  Administered 2020-12-20: 200 mg via ORAL

## 2020-12-20 MED ORDER — KETAMINE HCL 100 MG/ML IJ SOLN
INTRAMUSCULAR | Status: AC
  Filled 2020-12-20: qty 1

## 2020-12-20 MED ORDER — BUPIVACAINE HCL (PF) 0.5 % IJ SOLN
INTRAMUSCULAR | Status: DC | PRN
  Administered 2020-12-20: 30 mL

## 2020-12-20 MED ORDER — ONDANSETRON HCL 4 MG/2ML IJ SOLN
INTRAMUSCULAR | Status: AC
  Filled 2020-12-20: qty 2

## 2020-12-20 MED ORDER — DEXAMETHASONE SODIUM PHOSPHATE 4 MG/ML IJ SOLN
INTRAMUSCULAR | Status: DC | PRN
Start: 2020-12-20 — End: 2020-12-20
  Administered 2020-12-20: 10 mg via INTRAVENOUS

## 2020-12-20 MED ORDER — HYDROMORPHONE HCL 1 MG/ML IJ SOLN
0.2500 mg | INTRAMUSCULAR | Status: DC | PRN
Start: 2020-12-20 — End: 2020-12-20
  Administered 2020-12-20: 0.5 mg via INTRAVENOUS

## 2020-12-20 MED ORDER — OXYCODONE HCL 5 MG PO TABS
10.0000 mg | ORAL_TABLET | Freq: Once | ORAL | Status: AC
  Administered 2020-12-20: 10 mg via ORAL

## 2020-12-20 MED ORDER — ROCURONIUM BROMIDE 10 MG/ML (PF) SYRINGE
PREFILLED_SYRINGE | INTRAVENOUS | Status: AC
  Filled 2020-12-20: qty 10

## 2020-12-20 MED ORDER — ACETAMINOPHEN 500 MG PO TABS
1000.0000 mg | ORAL_TABLET | ORAL | Status: AC
  Administered 2020-12-20: 1000 mg via ORAL

## 2020-12-20 MED ORDER — LIDOCAINE HCL (CARDIAC) PF 100 MG/5ML IV SOSY
PREFILLED_SYRINGE | INTRAVENOUS | Status: DC | PRN
  Administered 2020-12-20: 60 mg via INTRAVENOUS

## 2020-12-20 MED ORDER — CEFAZOLIN SODIUM-DEXTROSE 2-4 GM/100ML-% IV SOLN
2.0000 g | INTRAVENOUS | Status: AC
  Administered 2020-12-20: 2 g via INTRAVENOUS

## 2020-12-20 MED ORDER — ACETAMINOPHEN 500 MG PO TABS
ORAL_TABLET | ORAL | Status: AC
  Filled 2020-12-20: qty 2

## 2020-12-20 MED ORDER — GABAPENTIN 300 MG PO CAPS
ORAL_CAPSULE | ORAL | Status: AC
  Filled 2020-12-20: qty 1

## 2020-12-20 MED ORDER — FENTANYL CITRATE (PF) 100 MCG/2ML IJ SOLN
INTRAMUSCULAR | Status: DC | PRN
  Administered 2020-12-20 (×2): 50 ug via INTRAVENOUS
  Administered 2020-12-20: 100 ug via INTRAVENOUS
  Administered 2020-12-20 (×2): 50 ug via INTRAVENOUS

## 2020-12-20 MED ORDER — CELECOXIB 200 MG PO CAPS
ORAL_CAPSULE | ORAL | Status: AC
  Filled 2020-12-20: qty 1

## 2020-12-20 MED ORDER — LIDOCAINE 2% (20 MG/ML) 5 ML SYRINGE
INTRAMUSCULAR | Status: AC
  Filled 2020-12-20: qty 5

## 2020-12-20 MED ORDER — EPHEDRINE 5 MG/ML INJ
INTRAVENOUS | Status: AC
  Filled 2020-12-20: qty 5

## 2020-12-20 SURGICAL SUPPLY — 78 items
ADH SKN CLS APL DERMABOND .7 (GAUZE/BANDAGES/DRESSINGS) ×8
APL PRP STRL LF DISP 70% ISPRP (MISCELLANEOUS) ×4
APPLIER CLIP 9.375 MED OPEN (MISCELLANEOUS)
APR CLP MED 9.3 20 MLT OPN (MISCELLANEOUS)
BAG DECANTER FOR FLEXI CONT (MISCELLANEOUS) IMPLANT
BINDER BREAST 3XL (GAUZE/BANDAGES/DRESSINGS) IMPLANT
BINDER BREAST LRG (GAUZE/BANDAGES/DRESSINGS) IMPLANT
BINDER BREAST MEDIUM (GAUZE/BANDAGES/DRESSINGS) IMPLANT
BINDER BREAST XLRG (GAUZE/BANDAGES/DRESSINGS) ×3 IMPLANT
BINDER BREAST XXLRG (GAUZE/BANDAGES/DRESSINGS) IMPLANT
BLADE SURG 10 STRL SS (BLADE) ×6 IMPLANT
BLADE SURG 15 STRL LF DISP TIS (BLADE) IMPLANT
BLADE SURG 15 STRL SS (BLADE)
BNDG ELASTIC 6X5.8 VLCR STR LF (GAUZE/BANDAGES/DRESSINGS) IMPLANT
BNDG GAUZE ELAST 4 BULKY (GAUZE/BANDAGES/DRESSINGS) ×6 IMPLANT
CANISTER SUCT 1200ML W/VALVE (MISCELLANEOUS) ×3 IMPLANT
CHLORAPREP W/TINT 26 (MISCELLANEOUS) ×6 IMPLANT
CLIP APPLIE 9.375 MED OPEN (MISCELLANEOUS) IMPLANT
CLIP TI MEDIUM 6 (CLIP) IMPLANT
COVER BACK TABLE 60X90IN (DRAPES) ×3 IMPLANT
COVER MAYO STAND STRL (DRAPES) ×3 IMPLANT
DECANTER SPIKE VIAL GLASS SM (MISCELLANEOUS) IMPLANT
DERMABOND ADVANCED (GAUZE/BANDAGES/DRESSINGS) ×4
DERMABOND ADVANCED .7 DNX12 (GAUZE/BANDAGES/DRESSINGS) ×8 IMPLANT
DRAIN CHANNEL 15F RND FF W/TCR (WOUND CARE) ×6 IMPLANT
DRAPE TOP ARMCOVERS (MISCELLANEOUS) ×3 IMPLANT
DRAPE U-SHAPE 76X120 STRL (DRAPES) ×3 IMPLANT
DRAPE UTILITY XL STRL (DRAPES) ×3 IMPLANT
DRSG PAD ABDOMINAL 8X10 ST (GAUZE/BANDAGES/DRESSINGS) ×6 IMPLANT
ELECT BLADE 4.0 EZ CLEAN MEGAD (MISCELLANEOUS)
ELECT COATED BLADE 2.86 ST (ELECTRODE) ×3 IMPLANT
ELECT REM PT RETURN 9FT ADLT (ELECTROSURGICAL) ×3
ELECTRODE BLDE 4.0 EZ CLN MEGD (MISCELLANEOUS) IMPLANT
ELECTRODE REM PT RTRN 9FT ADLT (ELECTROSURGICAL) ×2 IMPLANT
EVACUATOR SILICONE 100CC (DRAIN) ×6 IMPLANT
GLOVE SURG ENC MOIS LTX SZ6.5 (GLOVE) IMPLANT
GLOVE SURG HYDRASOFT LTX SZ5.5 (GLOVE) ×6 IMPLANT
GLOVE SURG POLYISO LF SZ6 (GLOVE) ×12 IMPLANT
GLOVE SURG POLYISO LF SZ6.5 (GLOVE) ×6 IMPLANT
GLOVE SURG UNDER POLY LF SZ6 (GLOVE) ×12 IMPLANT
GLOVE SURG UNDER POLY LF SZ6.5 (GLOVE) ×6 IMPLANT
GOWN STRL REUS W/ TWL LRG LVL3 (GOWN DISPOSABLE) ×6 IMPLANT
GOWN STRL REUS W/TWL LRG LVL3 (GOWN DISPOSABLE) ×9
IV NS 500ML (IV SOLUTION)
IV NS 500ML BAXH (IV SOLUTION) IMPLANT
KIT FILL SYSTEM UNIVERSAL (SET/KITS/TRAYS/PACK) IMPLANT
MARKER SKIN DUAL TIP RULER LAB (MISCELLANEOUS) IMPLANT
NEEDLE HYPO 25X1 1.5 SAFETY (NEEDLE) ×3 IMPLANT
NS IRRIG 1000ML POUR BTL (IV SOLUTION) ×3 IMPLANT
PACK BASIN DAY SURGERY FS (CUSTOM PROCEDURE TRAY) ×3 IMPLANT
PENCIL SMOKE EVACUATOR (MISCELLANEOUS) ×3 IMPLANT
PIN SAFETY STERILE (MISCELLANEOUS) ×3 IMPLANT
PUNCH BIOPSY DERMAL 4MM (MISCELLANEOUS) IMPLANT
SHEET MEDIUM DRAPE 40X70 STRL (DRAPES) ×6 IMPLANT
SLEEVE SCD COMPRESS KNEE MED (STOCKING) ×3 IMPLANT
SPONGE T-LAP 18X18 ~~LOC~~+RFID (SPONGE) ×6 IMPLANT
STAPLER INSORB 30 2030 C-SECTI (MISCELLANEOUS) IMPLANT
STAPLER VISISTAT 35W (STAPLE) ×3 IMPLANT
SUT ETHILON 2 0 FS 18 (SUTURE) ×3 IMPLANT
SUT MNCRL AB 4-0 PS2 18 (SUTURE) ×12 IMPLANT
SUT PDS 3-0 CT2 (SUTURE)
SUT PDS AB 2-0 CT2 27 (SUTURE) IMPLANT
SUT PDS II 3-0 CT2 27 ABS (SUTURE) IMPLANT
SUT PROLENE 2 0 CT2 30 (SUTURE) IMPLANT
SUT VIC AB 3-0 PS1 18 (SUTURE) ×21
SUT VIC AB 3-0 PS1 18XBRD (SUTURE) ×14 IMPLANT
SUT VIC AB 3-0 SH 27 (SUTURE)
SUT VIC AB 3-0 SH 27X BRD (SUTURE) IMPLANT
SUT VICRYL 4-0 PS2 18IN ABS (SUTURE) ×6 IMPLANT
SYR 50ML LL SCALE MARK (SYRINGE) IMPLANT
SYR BULB IRRIG 60ML STRL (SYRINGE) ×3 IMPLANT
SYR CONTROL 10ML LL (SYRINGE) ×3 IMPLANT
TAPE MEASURE VINYL STERILE (MISCELLANEOUS) IMPLANT
TOWEL GREEN STERILE FF (TOWEL DISPOSABLE) ×3 IMPLANT
TRAY FOLEY W/BAG SLVR 14FR LF (SET/KITS/TRAYS/PACK) IMPLANT
TUBE CONNECTING 20X1/4 (TUBING) ×3 IMPLANT
UNDERPAD 30X36 HEAVY ABSORB (UNDERPADS AND DIAPERS) ×6 IMPLANT
YANKAUER SUCT BULB TIP NO VENT (SUCTIONS) ×3 IMPLANT

## 2020-12-20 NOTE — H&P (Signed)
Subjective:     Patient ID: Sandy Goodwin is a 50 y.o. female.   HPI   Presents for breast reconstruction. Presented following screening MMG with right breast calcifications. Diagnostic MMG showed right breast calcifications spanning 4.1 cm and no abnormal right axillary LN. 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   MRI demonstrated right breast upper central portion clumped NME  4.1 x 3.6 x 1.4 cm with biopsy clip.   Completed neoadjuvant chemotherapy completing. Final MRI with resolution right breast enhancement. Left breast 6 o clock enhancement noted.MR guided biopsy left breast lesion with fibrocystic changes.  Underwent right lumpectomy with final pathology scattered foci high grade DCIS, no invasive carcinoma, margins clear, 0/3 SLN.   Mother, MA with breast ca. Genetics negative. Reports mother and MA underwent lumpectomy.   Current 38 F. Wt stable   PMH significant for bowel injury during laparoscopy. States had appendicitis with abscess, this was not know prior to surgery as was located in pelvis. Underwent repair via midline scar infraumbilical.   Works as [employer] for [employer]. Lives with spouse and two kids ages 53, 36.   Review of Systems   Remainder review negaive    Objective:   Physical Exam Cardiovascular:     Rate and Rhythm: Normal rate. Normal heart sounds Pulmonary:     Effort: Pulmonary effort is normal. Clear to auscultation   Abdominal:     Palpations: Abdomen is soft.     Comments: Midline infraumbilical scar  Lymphadenopathy:     Upper Body:     Right upper body: No axillary adenopathy.     Left upper body: No axillary adenopathy.  Skin:    Comments: Fitzpatrick 2      Breasts: Grade 3 ptosis bilateral SN to nipple R 32 L 32 cm BW R 20 L 20 cm Nipple to IMF R 11 L 13 cm    Assessment:     Right breast ca UOQ ER+ Neoadjuvant chemotherapy S/p right lumpectomy SLN    Plan:      Plan staged lumpectomy and  oncoplastic reconstruction. Reviewed oncoplastic reconstruction as staged procedure with reduction 7-10 d post lumpectomy to ensure pathologic clearance. Reviewed reduction with anchor type scars, drains, post operative visits and limitations, recovery. Diminished sensation nipple and breast skin, risk of nipple loss, wound healing problems, asymmetry. Discussed will have some contraction of breast volume and increased firmness with radiation, less ptosis with aging. This can result in asymmetries long term. Discussed changes with wt gain, loss, aging. Discussed lumpectomy alone can result in NAC displacement, distortion contour breast following lumpectomy and RT, asymmetry breast volume and NAC position. Reviewed purpose of this type reconstruction to prevent these. Reviewed breast lift or trying to correct NAC displacement post RT more difficult, higher risk complications. Reviewed any complications from oncoplastic reconstruction procedure may delay start RT.    Drain teaching completed. Rx for [facility] to [place] given.

## 2020-12-20 NOTE — Anesthesia Preprocedure Evaluation (Addendum)
Anesthesia Evaluation  Patient identified by MRN, date of birth, ID band Patient awake    Reviewed: Allergy & Precautions, NPO status , Patient's Chart, lab work & pertinent test results  Airway Mallampati: I  TM Distance: >3 FB Neck ROM: Full    Dental no notable dental hx. (+) Teeth Intact, Dental Advisory Given   Pulmonary neg pulmonary ROS,    Pulmonary exam normal breath sounds clear to auscultation       Cardiovascular hypertension, Pt. on medications Normal cardiovascular exam Rhythm:Regular Rate:Normal  TTE 2022 1. Left ventricular ejection fraction, by estimation, is 60 to 65%. The  left ventricle has normal function. The left ventricle has no regional  wall motion abnormalities. Left ventricular diastolic parameters were  normal. The average left ventricular  global longitudinal strain is -19.9 %. The global longitudinal strain is  normal.  2. Right ventricular systolic function is normal. The right ventricular  size is normal. There is normal pulmonary artery systolic pressure.  3. The mitral valve is normal in structure. No evidence of mitral valve  regurgitation. No evidence of mitral stenosis.  4. The aortic valve is tricuspid. Aortic valve regurgitation is not  visualized. No aortic stenosis is present.  5. The inferior vena cava is normal in size with greater than 50%  respiratory variability, suggesting right atrial pressure of 3 mmHg   Neuro/Psych PSYCHIATRIC DISORDERS Anxiety negative neurological ROS     GI/Hepatic Neg liver ROS, GERD  Medicated and Controlled,  Endo/Other  Hypothyroidism   Renal/GU negative Renal ROS  negative genitourinary   Musculoskeletal  (+) Arthritis ,   Abdominal   Peds  Hematology negative hematology ROS (+)   Anesthesia Other Findings   Reproductive/Obstetrics                            Anesthesia Physical Anesthesia Plan  ASA:  2  Anesthesia Plan: General   Post-op Pain Management:    Induction: Intravenous  PONV Risk Score and Plan: 3 and Midazolam, Dexamethasone and Ondansetron  Airway Management Planned: Oral ETT  Additional Equipment:   Intra-op Plan:   Post-operative Plan: Extubation in OR  Informed Consent: I have reviewed the patients History and Physical, chart, labs and discussed the procedure including the risks, benefits and alternatives for the proposed anesthesia with the patient or authorized representative who has indicated his/her understanding and acceptance.     Dental advisory given  Plan Discussed with: CRNA  Anesthesia Plan Comments:         Anesthesia Quick Evaluation

## 2020-12-20 NOTE — Anesthesia Procedure Notes (Signed)

## 2020-12-20 NOTE — Op Note (Signed)
Operative Note   DATE OF OPERATION: 9.9.22  LOCATION: [facility]-outpatient  SURGICAL DIVISION: Plastic Surgery  PREOPERATIVE DIAGNOSES:  Right breast ca UOQ ER+  POSTOPERATIVE DIAGNOSES:  same  PROCEDURE:  Right oncoplastic breast reconstruction left breast reduction  SURGEON: Irene Limbo MD MBA  ASSISTANT: none  ANESTHESIA:  General.   EBL: 60 ml  COMPLICATIONS: None immediate.   INDICATIONS FOR PROCEDURE:  The patient, Sandy Goodwin, is a 50 y.o. female born on [birth year], is here for staged breast reconstruction following right lumpectomy.   FINDINGS: Right reduction 283 g Left reduction 422 g  DESCRIPTION OF PROCEDURE:  The patient was marked standing in the preoperative area to mark sternal notch, chest midline, anterior axillary lines, inframammary folds. The location of new nipple areolar complex was marked at level of on inframammary fold on anterior surface breast by palpation. This was marked symmetric over bilateral breasts. With aid of Wise pattern marker, location of new nipple areolar complex and vertical limbs (7 cm) were marked by displacement of breasts along meridian. The patient was taken to the operating room. SCDs were placed and IV antibiotics were given. The patient's operative site was prepped and draped in a sterile fashion. A time out was performed and all information was confirmed to be correct.    Over left breast, superomedial pedicle marked and nipple areolar complex incised with 45 mm diameter marker. Pedicle deepithlialized and developed to chest wall. Breast tissue resected over lower pole. Medial and lateral flaps developed. Additional superior pole and lateral chest wall tissue excised. Breast tailor tacked closed.    I then directed attention to right breast where superior medial pedicle designed. NAC marked with 45 mm diameter marker. The pedicle was deepithelialized. Pedicle developed to chest wall. Breast tissue resected over lower  pole. Medial and lateral flaps developed. Breast cavities irrigated and hemostasis obtained. Local anesthetic infiltrated throughout each breast. 15 Fr JP placed in each breast and secured with 2-0 nylon. Closure completed bilateral with 3-0 vicryl to approximate dermis along inframammary fold and vertical limb. NAC inset with 4-0 vicryl in dermis. Skin closure completed with 4-0 monocryl subcuticular throughout. Tissue adhesive applied. Dry dressing and breast binder applied.  The patient was allowed to wake from anesthesia, extubated and taken to the recovery room in satisfactory condition.   SPECIMENS: right and left breast reduction  DRAINS: 15 Fr JP in right and left breast

## 2020-12-20 NOTE — Transfer of Care (Signed)
Immediate Anesthesia Transfer of Care Note  Patient: Sandy Goodwin  Procedure(s) Performed: RIGHT ONCOPLASTIC RECONSTRUCTION (Right: Breast) LEFT BREAST REDUCTION (Left: Breast)  Patient Location: PACU  Anesthesia Type:General  Level of Consciousness: awake, alert , oriented, drowsy and patient cooperative  Airway & Oxygen Therapy: Patient Spontanous Breathing and Patient connected to face mask oxygen  Post-op Assessment: Report given to RN and Post -op Vital signs reviewed and stable  Post vital signs: Reviewed and stable  Last Vitals:  Vitals Value Taken Time  BP    Temp    Pulse 80 12/20/20 1432  Resp    SpO2 98 % 12/20/20 1432  Vitals shown include unvalidated device data.  Last Pain:  Vitals:   12/20/20 0951  TempSrc: Oral  PainSc: 0-No pain      Patients Stated Pain Goal: 7 ([phone] [id number])  Complications: No notable events documented.

## 2020-12-20 NOTE — Anesthesia Postprocedure Evaluation (Signed)
Anesthesia Post Note  Patient: Sandy Goodwin  Procedure(s) Performed: RIGHT ONCOPLASTIC RECONSTRUCTION (Right: Breast) LEFT BREAST REDUCTION (Left: Breast)     Patient location during evaluation: PACU Anesthesia Type: General Level of consciousness: awake and alert Pain management: pain level controlled Vital Signs Assessment: post-procedure vital signs reviewed and stable Respiratory status: spontaneous breathing, nonlabored ventilation, respiratory function stable and patient connected to nasal cannula oxygen Cardiovascular status: blood pressure returned to baseline and stable Postop Assessment: no apparent nausea or vomiting Anesthetic complications: no   No notable events documented.  Last Vitals:  Vitals:   12/20/20 1545 12/20/20 1700  BP: 122/78 125/85  Pulse: 76 79  Resp: 12 16  Temp:  36.6 C  SpO2: 91% 97%    Last Pain:  Vitals:   12/20/20 1700  TempSrc:   PainSc: 1                  Yosmar Ryker L Loreal Schuessler

## 2020-12-20 NOTE — Discharge Instructions (Signed)

## 2020-12-22 ENCOUNTER — Encounter (HOSPITAL_BASED_OUTPATIENT_CLINIC_OR_DEPARTMENT_OTHER): Payer: Self-pay | Admitting: Plastic Surgery

## 2020-12-23 ENCOUNTER — Encounter: Payer: Self-pay | Admitting: *Deleted

## 2020-12-23 ENCOUNTER — Telehealth: Payer: Self-pay | Admitting: Hematology and Oncology

## 2020-12-23 NOTE — Telephone Encounter (Signed)
Scheduled per 9/8 los. Pt will receive an updated appt calendar per next visit

## 2020-12-24 LAB — SURGICAL PATHOLOGY

## 2020-12-26 ENCOUNTER — Inpatient Hospital Stay: Payer: BC Managed Care – PPO

## 2020-12-26 ENCOUNTER — Other Ambulatory Visit: Payer: Self-pay

## 2020-12-26 VITALS — BP 129/85 | HR 68 | Temp 98.4°F | Resp 18

## 2020-12-26 DIAGNOSIS — C50411 Malignant neoplasm of upper-outer quadrant of right female breast: Secondary | ICD-10-CM

## 2020-12-26 DIAGNOSIS — Z5112 Encounter for antineoplastic immunotherapy: Secondary | ICD-10-CM | POA: Diagnosis not present

## 2020-12-26 DIAGNOSIS — Z95828 Presence of other vascular implants and grafts: Secondary | ICD-10-CM

## 2020-12-26 DIAGNOSIS — Z170 Estrogen receptor positive status [ER+]: Secondary | ICD-10-CM

## 2020-12-26 LAB — CBC WITH DIFFERENTIAL (CANCER CENTER ONLY)
Abs Immature Granulocytes: 0.04 10*3/uL (ref 0.00–0.07)
Basophils Absolute: 0 10*3/uL (ref 0.0–0.1)
Basophils Relative: 1 %
Eosinophils Absolute: 0.2 10*3/uL (ref 0.0–0.5)
Eosinophils Relative: 3 %
HCT: 28.3 % — ABNORMAL LOW (ref 36.0–46.0)
Hemoglobin: 9 g/dL — ABNORMAL LOW (ref 12.0–15.0)
Immature Granulocytes: 1 %
Lymphocytes Relative: 25 %
Lymphs Abs: 1.5 10*3/uL (ref 0.7–4.0)
MCH: 31.6 pg (ref 26.0–34.0)
MCHC: 31.8 g/dL (ref 30.0–36.0)
MCV: 99.3 fL (ref 80.0–100.0)
Monocytes Absolute: 0.4 10*3/uL (ref 0.1–1.0)
Monocytes Relative: 7 %
Neutro Abs: 3.8 10*3/uL (ref 1.7–7.7)
Neutrophils Relative %: 63 %
Platelet Count: 223 10*3/uL (ref 150–400)
RBC: 2.85 MIL/uL — ABNORMAL LOW (ref 3.87–5.11)
RDW: 14.6 % (ref 11.5–15.5)
WBC Count: 6 10*3/uL (ref 4.0–10.5)
nRBC: 0 % (ref 0.0–0.2)

## 2020-12-26 LAB — CMP (CANCER CENTER ONLY)
ALT: 26 U/L (ref 0–44)
AST: 23 U/L (ref 15–41)
Albumin: 3.4 g/dL — ABNORMAL LOW (ref 3.5–5.0)
Alkaline Phosphatase: 90 U/L (ref 38–126)
Anion gap: 8 (ref 5–15)
BUN: 19 mg/dL (ref 6–20)
CO2: 24 mmol/L (ref 22–32)
Calcium: 8.7 mg/dL — ABNORMAL LOW (ref 8.9–10.3)
Chloride: 109 mmol/L (ref 98–111)
Creatinine: 0.76 mg/dL (ref 0.44–1.00)
GFR, Estimated: >60 mL/min (ref >60)
Glucose, Bld: 100 mg/dL — ABNORMAL HIGH (ref 70–99)
Potassium: 4.1 mmol/L (ref 3.5–5.1)
Sodium: 141 mmol/L (ref 135–145)
Total Bilirubin: <0.2 mg/dL — ABNORMAL LOW (ref 0.3–1.2)
Total Protein: 6.5 g/dL (ref 6.5–8.1)

## 2020-12-26 MED ORDER — ACETAMINOPHEN 325 MG PO TABS: 650.0000 mg | ORAL_TABLET | Freq: Once | ORAL | Status: DC

## 2020-12-26 MED ORDER — TRASTUZUMAB-DKST CHEMO 150 MG IV SOLR
6.0000 mg/kg | Freq: Once | INTRAVENOUS | Status: AC
  Administered 2020-12-26: 546 mg via INTRAVENOUS
  Filled 2020-12-26: qty 26

## 2020-12-26 MED ORDER — DIPHENHYDRAMINE HCL 25 MG PO CAPS
25.0000 mg | ORAL_CAPSULE | Freq: Once | ORAL | Status: AC
  Administered 2020-12-26: 25 mg via ORAL
  Filled 2020-12-26: qty 1

## 2020-12-26 MED ORDER — SODIUM CHLORIDE 0.9 % IV SOLN: Freq: Once | INTRAVENOUS | Status: AC

## 2020-12-26 MED ORDER — SODIUM CHLORIDE 0.9 % IV SOLN
420.0000 mg | Freq: Once | INTRAVENOUS | Status: AC
  Administered 2020-12-26: 420 mg via INTRAVENOUS
  Filled 2020-12-26: qty 14

## 2020-12-26 MED ORDER — SODIUM CHLORIDE 0.9% FLUSH
10.0000 mL | Freq: Once | INTRAVENOUS | Status: AC
  Administered 2020-12-26: 10 mL via INTRAVENOUS

## 2020-12-26 MED ORDER — SODIUM CHLORIDE 0.9% FLUSH
10.0000 mL | INTRAVENOUS | Status: DC | PRN
  Administered 2020-12-26: 10 mL

## 2020-12-26 MED ORDER — HEPARIN SOD (PORK) LOCK FLUSH 100 UNIT/ML IV SOLN
500.0000 [IU] | Freq: Once | INTRAVENOUS | Status: AC | PRN
  Administered 2020-12-26: 500 [IU]

## 2020-12-26 NOTE — Patient Instructions (Signed)
[facility]  Discharge Instructions: Thank you for choosing [facility] to provide your oncology and hematology care.   If you have a lab appointment with the [facility], please go directly to the [facility] and check in at the registration area.   Wear comfortable clothing and clothing appropriate for easy access to any Portacath or PICC line.   We strive to give you quality time with your provider. You may need to reschedule your appointment if you arrive late (15 or more minutes).  Arriving late affects you and other patients whose appointments are after yours.  Also, if you miss three or more appointments without notifying the office, you may be dismissed from the clinic at the provider's discretion.      For prescription refill requests, have your pharmacy contact our office and allow 72 hours for refills to be completed.    Today you received the following chemotherapy and/or immunotherapy agents herceptin, perjeta      To help prevent nausea and vomiting after your treatment, we encourage you to take your nausea medication as directed.  BELOW ARE SYMPTOMS THAT SHOULD BE REPORTED IMMEDIATELY: *FEVER GREATER THAN 100.4 F (38 C) OR HIGHER *CHILLS OR SWEATING *NAUSEA AND VOMITING THAT IS NOT CONTROLLED WITH YOUR NAUSEA MEDICATION *UNUSUAL SHORTNESS OF BREATH *UNUSUAL BRUISING OR BLEEDING *URINARY PROBLEMS (pain or burning when urinating, or frequent urination) *BOWEL PROBLEMS (unusual diarrhea, constipation, pain near the anus) TENDERNESS IN MOUTH AND THROAT WITH OR WITHOUT PRESENCE OF ULCERS (sore throat, sores in mouth, or a toothache) UNUSUAL RASH, SWELLING OR PAIN  UNUSUAL VAGINAL DISCHARGE OR ITCHING   Items with * indicate a potential emergency and should be followed up as soon as possible or go to the Emergency Department if any problems should occur.  Please show the CHEMOTHERAPY ALERT CARD or IMMUNOTHERAPY ALERT CARD at  check-in to the Emergency Department and triage nurse.  Should you have questions after your visit or need to cancel or reschedule your appointment, please contact [facility]  Dept: [phone]  and follow the prompts.  Office hours are 8:00 a.m. to 4:30 p.m. Monday - Friday. Please note that voicemails left after 4:00 p.m. may not be returned until the following business day.  We are closed weekends and major holidays. You have access to a nurse at all times for urgent questions. Please call the main number to the clinic Dept: [phone] and follow the prompts.   For any non-urgent questions, you may also contact your provider using MyChart. We now offer e-Visits for anyone 48 and older to request care online for non-urgent symptoms. For details visit [email or web address].   Also download the MyChart app! Go to the app store, search "MyChart", open the app, select [facility], and log in with your MyChart username and password.  Due to Covid, a mask is required upon entering the hospital/clinic. If you do not have a mask, one will be given to you upon arrival. For doctor visits, patients may have 1 support person aged 48 or older with them. For treatment visits, patients cannot have anyone with them due to current Covid guidelines and our immunocompromised population.

## 2020-12-27 ENCOUNTER — Ambulatory Visit (HOSPITAL_COMMUNITY)
Admission: RE | Admit: 2020-12-27 | Discharge: 2020-12-27 | Disposition: A | Discharge status: Home / Self Care | Payer: BC Managed Care – PPO | Source: Ambulatory Visit | Attending: Hematology and Oncology | Admitting: Hematology and Oncology

## 2020-12-27 DIAGNOSIS — Z170 Estrogen receptor positive status [ER+]: Secondary | ICD-10-CM | POA: Insufficient documentation

## 2020-12-27 DIAGNOSIS — C50411 Malignant neoplasm of upper-outer quadrant of right female breast: Secondary | ICD-10-CM | POA: Insufficient documentation

## 2020-12-27 DIAGNOSIS — Z0189 Encounter for other specified special examinations: Secondary | ICD-10-CM

## 2020-12-27 DIAGNOSIS — Z01818 Encounter for other preprocedural examination: Secondary | ICD-10-CM | POA: Insufficient documentation

## 2020-12-27 LAB — ECHOCARDIOGRAM COMPLETE
Area-P 1/2: 4.15 cm2
Calc EF: 60.5 %
S' Lateral: 3.5 cm
Single Plane A2C EF: 61.1 %
Single Plane A4C EF: 57.4 %

## 2020-12-27 NOTE — Progress Notes (Signed)
  Echocardiogram 2D Echocardiogram has been performed.  Sandy Goodwin 12/27/2020, 9:57 AM

## 2021-01-06 ENCOUNTER — Ambulatory Visit: Payer: BC Managed Care – PPO | Admitting: Physical Therapy

## 2021-01-09 NOTE — H&P (Signed)
Subjective:     Patient ID: Sandy Goodwin is a 50 y.o. female.   HPI   3 weeks post op oncoplastic breast reduction. Reported onset fever and pain right axilla and right lateral breast 1 week ago. Two days ago aspirated fluid from right axillary incision with culture S. Aureus. On Bactrim. Reports continued temp to 101F and recurrent redness right chest.   Presented following screening MMG with right breast calcifications. Diagnostic MMG showed right breast calcifications spanning 4.1 cm and no abnormal right axillary LN. 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   MRI demonstrated right breast upper central portion clumped NME  4.1 x 3.6 x 1.4 cm with biopsy clip.   Completed neoadjuvant chemotherapy. Final MRI with resolution right breast enhancement. Left breast 6 o clock enhancement noted. MR guided biopsy left breast lesion with fibrocystic changes.   Underwent right lumpectomy with final pathology scattered foci high grade DCIS, no invasive carcinoma, margins clear, 0/3 SLN.   Mother, MA with breast ca. Genetics negative. Reports mother and MA underwent lumpectomy.   Prior 60 F. Right reduction 283 g Left reduction 422 g. Weight lumpectomy not recorded.   PMH significant for bowel injury during laparoscopy. States had appendicitis with abscess, this was not know prior to surgery as was located in pelvis. Underwent repair via midline scar infraumbilical.   Works as [place] for [employer]. Lives with spouse and two kids.   Review of Systems    Objective:   Physical Exam   CV: normal heart sounds PULM: clear to auscultation  Breasts: Left breast benign incisions intact soft Right breast axillary incision scar intact, right breast incisions intact with exception  lateral IMF scar with two areas superficial separation each < 1cm width, no drainage and few mm depth. Cellulitis noted over UOQ breast    Assessment:     Right breast ca UOQ ER+ Neoadjuvant  chemotherapy S/p right lumpectomy SLN S/p oncoplastic reduction Seroma right axilla cellulitis    Plan:     Continued fever and erythema post aspiration right axillary fluid collection. Plan operative wash out. Dicussed may need to leave wound open post operatively and may require packing.

## 2021-01-09 NOTE — Anesthesia Preprocedure Evaluation (Addendum)
Anesthesia Evaluation  Patient identified by MRN, date of birth, ID band Patient awake    Reviewed: Allergy & Precautions, NPO status , Patient's Chart, lab work & pertinent test results  Airway Mallampati: II  TM Distance: >3 FB Neck ROM: Full    Dental no notable dental hx. (+) Teeth Intact, Dental Advisory Given   Pulmonary neg pulmonary ROS,    Pulmonary exam normal breath sounds clear to auscultation       Cardiovascular Normal cardiovascular exam+ dysrhythmias  Rhythm:Regular Rate:Normal  12/27/20 TTE 1. GLS stable was -19.9 on 10/18/20. Left ventricular ejection fraction, by  estimation, is 60 to 65%. The left ventricle has normal function. The left  ventricle has no regional wall motion abnormalities. Left ventricular  diastolic parameters were normal.  The average left ventricular global longitudinal strain is -20.6 %. The  global longitudinal strain is normal.  2. Right ventricular systolic function is normal. The right ventricular  size is normal.  3. The mitral valve is normal in structure. No evidence of mitral valve  regurgitation. No evidence of mitral stenosis.  4. The aortic valve is tricuspid. Aortic valve regurgitation is not  visualized. No aortic stenosis is present.  5. The inferior vena cava is normal in size with greater than 50%  respiratory variability, suggesting right atrial pressure of 3 mmHg.    Neuro/Psych Anxiety negative neurological ROS  negative psych ROS   GI/Hepatic Neg liver ROS,   Endo/Other  Hypothyroidism   Renal/GU negative Renal ROS     Musculoskeletal  (+) Arthritis , Rheumatoid disorders,    Abdominal (+) - obese (BMi 32.47),   Peds  Hematology   Anesthesia Other Findings S/P R Lumpectomy  Reproductive/Obstetrics                            Anesthesia Physical Anesthesia Plan  ASA: 2  Anesthesia Plan: General   Post-op Pain Management:     Induction: Intravenous  PONV Risk Score and Plan: 4 or greater and Treatment may vary due to age or medical condition, Midazolam, Dexamethasone and Ondansetron  Airway Management Planned: LMA  Additional Equipment: None  Intra-op Plan:   Post-operative Plan:   Informed Consent: I have reviewed the patients History and Physical, chart, labs and discussed the procedure including the risks, benefits and alternatives for the proposed anesthesia with the patient or authorized representative who has indicated his/her understanding and acceptance.     Dental advisory given  Plan Discussed with:   Anesthesia Plan Comments:        Anesthesia Quick Evaluation

## 2021-01-10 ENCOUNTER — Ambulatory Visit (HOSPITAL_BASED_OUTPATIENT_CLINIC_OR_DEPARTMENT_OTHER): Payer: BC Managed Care – PPO | Admitting: Anesthesiology

## 2021-01-10 ENCOUNTER — Other Ambulatory Visit: Payer: Self-pay

## 2021-01-10 ENCOUNTER — Encounter (HOSPITAL_BASED_OUTPATIENT_CLINIC_OR_DEPARTMENT_OTHER): Payer: Self-pay | Admitting: Plastic Surgery

## 2021-01-10 ENCOUNTER — Ambulatory Visit (HOSPITAL_BASED_OUTPATIENT_CLINIC_OR_DEPARTMENT_OTHER)
Admission: RE | Admit: 2021-01-10 | Discharge: 2021-01-10 | Disposition: A | Discharge status: Home / Self Care | Payer: BC Managed Care – PPO | Attending: Plastic Surgery | Admitting: Plastic Surgery

## 2021-01-10 ENCOUNTER — Encounter (HOSPITAL_BASED_OUTPATIENT_CLINIC_OR_DEPARTMENT_OTHER)
Admission: RE | Disposition: A | Discharge status: Home / Self Care | Payer: Self-pay | Source: Home / Self Care | Attending: Plastic Surgery

## 2021-01-10 DIAGNOSIS — Z9889 Other specified postprocedural states: Secondary | ICD-10-CM | POA: Diagnosis not present

## 2021-01-10 DIAGNOSIS — L03111 Cellulitis of right axilla: Secondary | ICD-10-CM | POA: Insufficient documentation

## 2021-01-10 DIAGNOSIS — Z853 Personal history of malignant neoplasm of breast: Secondary | ICD-10-CM | POA: Insufficient documentation

## 2021-01-10 DIAGNOSIS — Z9221 Personal history of antineoplastic chemotherapy: Secondary | ICD-10-CM | POA: Insufficient documentation

## 2021-01-10 DIAGNOSIS — L02411 Cutaneous abscess of right axilla: Secondary | ICD-10-CM | POA: Insufficient documentation

## 2021-01-10 DIAGNOSIS — Z803 Family history of malignant neoplasm of breast: Secondary | ICD-10-CM | POA: Diagnosis not present

## 2021-01-10 HISTORY — PX: INCISION AND DRAINAGE OF WOUND: SHX1803

## 2021-01-10 SURGERY — IRRIGATION AND DEBRIDEMENT WOUND
Anesthesia: General | Site: Axilla | Laterality: Right

## 2021-01-10 MED ORDER — OXYCODONE HCL 5 MG PO TABS
ORAL_TABLET | ORAL | Status: AC
  Filled 2021-01-10: qty 1

## 2021-01-10 MED ORDER — SODIUM CHLORIDE 0.9 % IV SOLN
INTRAVENOUS | Status: DC | PRN
  Administered 2021-01-10: 500 mL

## 2021-01-10 MED ORDER — DEXMEDETOMIDINE (PRECEDEX) IN NS 20 MCG/5ML (4 MCG/ML) IV SYRINGE
PREFILLED_SYRINGE | INTRAVENOUS | Status: DC | PRN
  Administered 2021-01-10: 8 ug via INTRAVENOUS

## 2021-01-10 MED ORDER — AMISULPRIDE (ANTIEMETIC) 5 MG/2ML IV SOLN: 10.0000 mg | Freq: Once | INTRAVENOUS | Status: DC | PRN

## 2021-01-10 MED ORDER — CELECOXIB 200 MG PO CAPS
200.0000 mg | ORAL_CAPSULE | ORAL | Status: AC
  Administered 2021-01-10: 200 mg via ORAL

## 2021-01-10 MED ORDER — ACETAMINOPHEN 500 MG PO TABS
ORAL_TABLET | ORAL | Status: AC
  Filled 2021-01-10: qty 2

## 2021-01-10 MED ORDER — PROPOFOL 10 MG/ML IV BOLUS
INTRAVENOUS | Status: AC
  Filled 2021-01-10: qty 20

## 2021-01-10 MED ORDER — ACETAMINOPHEN 10 MG/ML IV SOLN: 1000.0000 mg | Freq: Once | INTRAVENOUS | Status: DC | PRN

## 2021-01-10 MED ORDER — LIDOCAINE HCL (CARDIAC) PF 100 MG/5ML IV SOSY
PREFILLED_SYRINGE | INTRAVENOUS | Status: DC | PRN
  Administered 2021-01-10: 100 mg via INTRAVENOUS

## 2021-01-10 MED ORDER — HYDROMORPHONE HCL 1 MG/ML IJ SOLN
INTRAMUSCULAR | Status: AC
  Filled 2021-01-10: qty 0.5

## 2021-01-10 MED ORDER — PHENYLEPHRINE HCL (PRESSORS) 10 MG/ML IV SOLN
INTRAVENOUS | Status: DC | PRN
  Administered 2021-01-10: 40 ug via INTRAVENOUS

## 2021-01-10 MED ORDER — DEXAMETHASONE SODIUM PHOSPHATE 10 MG/ML IJ SOLN
INTRAMUSCULAR | Status: AC
  Filled 2021-01-10: qty 1

## 2021-01-10 MED ORDER — HYDROMORPHONE HCL 1 MG/ML IJ SOLN
0.2500 mg | INTRAMUSCULAR | Status: DC | PRN
  Administered 2021-01-10 (×2): 0.5 mg via INTRAVENOUS

## 2021-01-10 MED ORDER — PHENYLEPHRINE 40 MCG/ML (10ML) SYRINGE FOR IV PUSH (FOR BLOOD PRESSURE SUPPORT)
PREFILLED_SYRINGE | INTRAVENOUS | Status: AC
  Filled 2021-01-10: qty 10

## 2021-01-10 MED ORDER — MIDAZOLAM HCL 2 MG/2ML IJ SOLN
INTRAMUSCULAR | Status: AC
  Filled 2021-01-10: qty 2

## 2021-01-10 MED ORDER — ONDANSETRON HCL 4 MG/2ML IJ SOLN
INTRAMUSCULAR | Status: AC
  Filled 2021-01-10: qty 2

## 2021-01-10 MED ORDER — SODIUM CHLORIDE 0.9 % IV SOLN
INTRAVENOUS | Status: AC
  Filled 2021-01-10: qty 10

## 2021-01-10 MED ORDER — ONDANSETRON HCL 4 MG/2ML IJ SOLN
INTRAMUSCULAR | Status: DC | PRN
  Administered 2021-01-10: 4 mg via INTRAVENOUS

## 2021-01-10 MED ORDER — LACTATED RINGERS IV SOLN: INTRAVENOUS | Status: DC

## 2021-01-10 MED ORDER — GABAPENTIN 300 MG PO CAPS
ORAL_CAPSULE | ORAL | Status: AC
  Filled 2021-01-10: qty 1

## 2021-01-10 MED ORDER — OXYCODONE HCL 5 MG PO TABS
5.0000 mg | ORAL_TABLET | Freq: Once | ORAL | Status: AC | PRN
  Administered 2021-01-10: 5 mg via ORAL

## 2021-01-10 MED ORDER — FENTANYL CITRATE (PF) 100 MCG/2ML IJ SOLN
INTRAMUSCULAR | Status: DC | PRN
  Administered 2021-01-10: 50 ug via INTRAVENOUS

## 2021-01-10 MED ORDER — VANCOMYCIN HCL IN DEXTROSE 1-5 GM/200ML-% IV SOLN
1000.0000 mg | INTRAVENOUS | Status: AC
  Administered 2021-01-10: 1000 mg via INTRAVENOUS

## 2021-01-10 MED ORDER — PROPOFOL 10 MG/ML IV BOLUS
INTRAVENOUS | Status: DC | PRN
  Administered 2021-01-10: 130 mg via INTRAVENOUS

## 2021-01-10 MED ORDER — LIDOCAINE 2% (20 MG/ML) 5 ML SYRINGE
INTRAMUSCULAR | Status: AC
  Filled 2021-01-10: qty 5

## 2021-01-10 MED ORDER — FENTANYL CITRATE (PF) 100 MCG/2ML IJ SOLN
INTRAMUSCULAR | Status: AC
  Filled 2021-01-10: qty 2

## 2021-01-10 MED ORDER — ACETAMINOPHEN 500 MG PO TABS
1000.0000 mg | ORAL_TABLET | ORAL | Status: AC
  Administered 2021-01-10: 1000 mg via ORAL

## 2021-01-10 MED ORDER — MIDAZOLAM HCL 5 MG/5ML IJ SOLN
INTRAMUSCULAR | Status: DC | PRN
  Administered 2021-01-10: 2 mg via INTRAVENOUS

## 2021-01-10 MED ORDER — DEXAMETHASONE SODIUM PHOSPHATE 10 MG/ML IJ SOLN
INTRAMUSCULAR | Status: DC | PRN
  Administered 2021-01-10: 5 mg via INTRAVENOUS

## 2021-01-10 MED ORDER — SULFAMETHOXAZOLE-TRIMETHOPRIM 800-160 MG PO TABS: 1.0000 | ORAL_TABLET | Freq: Two times a day (BID) | ORAL | 0 refills | Status: DC

## 2021-01-10 MED ORDER — ONDANSETRON HCL 4 MG/2ML IJ SOLN: 4.0000 mg | Freq: Once | INTRAMUSCULAR | Status: DC | PRN

## 2021-01-10 MED ORDER — GABAPENTIN 300 MG PO CAPS
300.0000 mg | ORAL_CAPSULE | ORAL | Status: AC
  Administered 2021-01-10: 300 mg via ORAL

## 2021-01-10 MED ORDER — OXYCODONE HCL 5 MG/5ML PO SOLN: 5.0000 mg | Freq: Once | ORAL | Status: AC | PRN

## 2021-01-10 MED ORDER — VANCOMYCIN HCL IN DEXTROSE 1-5 GM/200ML-% IV SOLN
INTRAVENOUS | Status: AC
  Filled 2021-01-10: qty 200

## 2021-01-10 MED ORDER — CELECOXIB 200 MG PO CAPS
ORAL_CAPSULE | ORAL | Status: AC
  Filled 2021-01-10: qty 1

## 2021-01-10 SURGICAL SUPPLY — 52 items
ADH SKN CLS APL DERMABOND .7 (GAUZE/BANDAGES/DRESSINGS)
APL PRP STRL LF DISP 70% ISPRP (MISCELLANEOUS)
BAG DECANTER FOR FLEXI CONT (MISCELLANEOUS) ×2 IMPLANT
BLADE HEX COATED 2.75 (ELECTRODE) ×2 IMPLANT
BLADE SURG 10 STRL SS (BLADE) IMPLANT
BLADE SURG 15 STRL LF DISP TIS (BLADE) IMPLANT
BLADE SURG 15 STRL SS (BLADE)
BNDG GAUZE ELAST 4 BULKY (GAUZE/BANDAGES/DRESSINGS) IMPLANT
CANISTER SUCT 1200ML W/VALVE (MISCELLANEOUS) ×2 IMPLANT
CHLORAPREP W/TINT 26 (MISCELLANEOUS) IMPLANT
COVER BACK TABLE 60X90IN (DRAPES) ×2 IMPLANT
COVER MAYO STAND STRL (DRAPES) ×2 IMPLANT
DECANTER SPIKE VIAL GLASS SM (MISCELLANEOUS) IMPLANT
DERMABOND ADVANCED (GAUZE/BANDAGES/DRESSINGS)
DERMABOND ADVANCED .7 DNX12 (GAUZE/BANDAGES/DRESSINGS) IMPLANT
DRAIN CHANNEL 15F RND FF W/TCR (WOUND CARE) IMPLANT
DRAPE LAPAROSCOPIC ABDOMINAL (DRAPES) ×2 IMPLANT
DRAPE TOP ARMCOVERS (MISCELLANEOUS) IMPLANT
DRAPE U-SHAPE 76X120 STRL (DRAPES) IMPLANT
DRAPE UTILITY XL STRL (DRAPES) ×2 IMPLANT
DRSG PAD ABDOMINAL 8X10 ST (GAUZE/BANDAGES/DRESSINGS) IMPLANT
ELECT COATED BLADE 2.86 ST (ELECTRODE) IMPLANT
ELECT REM PT RETURN 9FT ADLT (ELECTROSURGICAL) ×2
ELECTRODE REM PT RTRN 9FT ADLT (ELECTROSURGICAL) ×1 IMPLANT
EVACUATOR SILICONE 100CC (DRAIN) IMPLANT
GAUZE SPONGE 4X4 12PLY STRL (GAUZE/BANDAGES/DRESSINGS) IMPLANT
GAUZE SPONGE 4X4 12PLY STRL LF (GAUZE/BANDAGES/DRESSINGS) IMPLANT
GLOVE SURG HYDRASOFT LTX SZ5.5 (GLOVE) ×2 IMPLANT
GOWN STRL REUS W/ TWL LRG LVL3 (GOWN DISPOSABLE) ×2 IMPLANT
GOWN STRL REUS W/TWL LRG LVL3 (GOWN DISPOSABLE) ×4
NEEDLE HYPO 25X1 1.5 SAFETY (NEEDLE) IMPLANT
NS IRRIG 1000ML POUR BTL (IV SOLUTION) IMPLANT
PACK BASIN DAY SURGERY FS (CUSTOM PROCEDURE TRAY) ×2 IMPLANT
PENCIL SMOKE EVACUATOR (MISCELLANEOUS) ×2 IMPLANT
PIN SAFETY STERILE (MISCELLANEOUS) IMPLANT
SHEET MEDIUM DRAPE 40X70 STRL (DRAPES) IMPLANT
SLEEVE SCD COMPRESS KNEE MED (STOCKING) IMPLANT
SPONGE T-LAP 18X18 ~~LOC~~+RFID (SPONGE) ×2 IMPLANT
SUT ETHILON 2 0 FS 18 (SUTURE) IMPLANT
SUT MNCRL AB 3-0 PS2 18 (SUTURE) ×2 IMPLANT
SUT MNCRL AB 4-0 PS2 18 (SUTURE) IMPLANT
SUT PDS AB 2-0 CT2 27 (SUTURE) ×2 IMPLANT
SUT VIC AB 3-0 FS2 27 (SUTURE) IMPLANT
SUT VICRYL 4-0 PS2 18IN ABS (SUTURE) ×2 IMPLANT
SWAB COLLECTION DEVICE MRSA (MISCELLANEOUS) IMPLANT
SWAB CULTURE ESWAB REG 1ML (MISCELLANEOUS) IMPLANT
SYR BULB IRRIG 60ML STRL (SYRINGE) ×2 IMPLANT
SYR CONTROL 10ML LL (SYRINGE) IMPLANT
TOWEL GREEN STERILE FF (TOWEL DISPOSABLE) ×4 IMPLANT
TUBE CONNECTING 20X1/4 (TUBING) ×2 IMPLANT
UNDERPAD 30X36 HEAVY ABSORB (UNDERPADS AND DIAPERS) ×4 IMPLANT
YANKAUER SUCT BULB TIP NO VENT (SUCTIONS) ×2 IMPLANT

## 2021-01-10 NOTE — Interval H&P Note (Signed)
History and Physical Interval Note:  01/10/2021 11:36 AM  Sandy Goodwin  has presented today for surgery, with the diagnosis of Cellulitis Right Axilla.  The various methods of treatment have been discussed with the patient and family. After consideration of risks, benefits and other options for treatment, the patient has consented to  Procedure(s): INCISION AND DRAINAGE OF RIGHT AXILLA (Right) as a surgical intervention.  The patient's history has been reviewed, patient examined, no change in status, stable for surgery.  I have reviewed the patient's chart and labs.  Questions were answered to the patient's satisfaction.     Arnoldo Hooker [name]

## 2021-01-10 NOTE — Discharge Instructions (Addendum)
  Post Anesthesia Home Care Instructions  Activity: Get plenty of rest for the remainder of the day. A responsible individual must stay with you for 24 hours following the procedure.  For the next 24 hours, DO NOT: -Drive a car -Paediatric nurse -Drink alcoholic beverages -Take any medication unless instructed by your physician -Make any legal decisions or sign important papers.  Meals: Start with liquid foods such as gelatin or soup. Progress to regular foods as tolerated. Avoid greasy, spicy, heavy foods. If nausea and/or vomiting occur, drink only clear liquids until the nausea and/or vomiting subsides. Call your physician if vomiting continues.  Special Instructions/Symptoms: Your throat may feel dry or sore from the anesthesia or the breathing tube placed in your throat during surgery. If this causes discomfort, gargle with warm salt water. The discomfort should disappear within 24 hours.  If you had a scopolamine patch placed behind your ear for the management of post- operative nausea and/or vomiting:  1. The medication in the patch is effective for 72 hours, after which it should be removed.  Wrap patch in a tissue and discard in the trash. Wash hands thoroughly with soap and water. 2. You may remove the patch earlier than 72 hours if you experience unpleasant side effects which may include dry mouth, dizziness or visual disturbances. 3. Avoid touching the patch. Wash your hands with soap and water after contact with the patch.     No tylenol before 4:45pm this afternoon. No ibuprofen/motrin before 6:45pm this afternoon.

## 2021-01-10 NOTE — Anesthesia Postprocedure Evaluation (Signed)
Anesthesia Post Note  Patient: Sandy Goodwin  Procedure(s) Performed: INCISION AND DRAINAGE OF RIGHT AXILLA (Right: Axilla)     Patient location during evaluation: PACU Anesthesia Type: General Level of consciousness: awake and alert Pain management: pain level controlled Vital Signs Assessment: post-procedure vital signs reviewed and stable Respiratory status: spontaneous breathing, nonlabored ventilation, respiratory function stable and patient connected to nasal cannula oxygen Cardiovascular status: blood pressure returned to baseline and stable Postop Assessment: no apparent nausea or vomiting Anesthetic complications: no   No notable events documented.  Last Vitals:  Vitals:   01/10/21 1345 01/10/21 1409  BP: (!) 97/56 120/74  Pulse: 72 68  Resp: 18 18  Temp:  36.4 C  SpO2: 91% 95%    Last Pain:  Vitals:   01/10/21 1402  TempSrc:   PainSc: 6                  Barnet Glasgow

## 2021-01-10 NOTE — Op Note (Signed)
Operative Note   DATE OF OPERATION: 9.30.22  LOCATION: [facility]-outpatient  SURGICAL DIVISION: Plastic Surgery  PREOPERATIVE DIAGNOSES:  1. Abscess right axilla 2. Right breast ca UOQ ER+ 3. S/p breast reduction  POSTOPERATIVE DIAGNOSES:  same  PROCEDURE:  Incision and drainage right axilla  SURGEON: Irene Limbo MD MBA  ASSISTANT: none  ANESTHESIA:  General.   EBL: 15 ml  COMPLICATIONS: None immediate.   INDICATIONS FOR PROCEDURE:  The patient, Sandy Goodwin, is a 50 y.o. female born on 1970-07-15, is here for incision drainage right axilla following lymph node sampling. Patient has undergone prior aspiration with culture demonstrating S. Aureus.   FINDINGS: Thick yellow fluid present in right axilla.   DESCRIPTION OF PROCEDURE:  The patient's operative site was marked with the patient in the preoperative area. The patient was taken to the operating room. SCDs were placed and IV antibiotics were given. The patient's operative site was prepped and draped in a sterile fashion. A time out was performed and all information was confirmed to be correct. Incision made in right axillary scar and carried through superficial fascia. Fluid cavity encountered and drained approximately 10-15 ml thick yellow fluid. Curettage performed over cavity. Cavity irrigated with saline solution containing Ancef and gentamicin. Wound approximated loosely with 2-0 PDS in superficial fascia interrupted and 3-0 monocryl interrupted in dermis. Guaze packed into cavity and dry dressing applied.  The patient was allowed to wake from anesthesia, extubated and taken to the recovery room in satisfactory condition.   SPECIMENS: none  DRAINS: none

## 2021-01-10 NOTE — Anesthesia Procedure Notes (Signed)
Procedure Name: LMA Insertion Date/Time: 01/10/2021 12:10 PM Performed by: Lavonia Dana, CRNA Pre-anesthesia Checklist: Patient identified, Emergency Drugs available, Suction available and Patient being monitored Patient Re-evaluated:Patient Re-evaluated prior to induction Oxygen Delivery Method: Circle system utilized Preoxygenation: Pre-oxygenation with 100% oxygen Induction Type: IV induction Ventilation: Mask ventilation without difficulty LMA: LMA inserted LMA Size: 4.0 Number of attempts: 1 Airway Equipment and Method: Bite block Placement Confirmation: positive ETCO2 Tube secured with: Tape Dental Injury: Teeth and Oropharynx as per pre-operative assessment

## 2021-01-10 NOTE — Transfer of Care (Signed)
Immediate Anesthesia Transfer of Care Note  Patient: Sandy Goodwin  Procedure(s) Performed: INCISION AND DRAINAGE OF RIGHT AXILLA (Right: Axilla)  Patient Location: PACU  Anesthesia Type:General  Level of Consciousness: drowsy  Airway & Oxygen Therapy: Patient Spontanous Breathing and Patient connected to face mask oxygen  Post-op Assessment: Report given to RN and Post -op Vital signs reviewed and stable  Post vital signs: Reviewed and stable  Last Vitals:  Vitals Value Taken Time  BP 110/62 01/10/21 1255  Temp    Pulse 61 01/10/21 1257  Resp 14 01/10/21 1257  SpO2 100 % 01/10/21 1257  Vitals shown include unvalidated device data.  Last Pain:  Vitals:   01/10/21 1023  TempSrc: Oral  PainSc: 1       Patients Stated Pain Goal: 2 (10/11/14 0109)  Complications: No notable events documented.

## 2021-01-13 ENCOUNTER — Encounter (HOSPITAL_BASED_OUTPATIENT_CLINIC_OR_DEPARTMENT_OTHER): Payer: Self-pay | Admitting: Plastic Surgery

## 2021-01-14 ENCOUNTER — Telehealth: Payer: Self-pay | Admitting: Hematology and Oncology

## 2021-01-14 NOTE — Telephone Encounter (Signed)
Rescheduled appointment per Sandy Goodwin. Patient aware.   Patient will call back if she can not move her other appointment that she has with her surgeon.

## 2021-01-16 ENCOUNTER — Ambulatory Visit: Payer: BC Managed Care – PPO

## 2021-01-16 ENCOUNTER — Ambulatory Visit: Payer: BC Managed Care – PPO | Admitting: Radiation Oncology

## 2021-01-16 ENCOUNTER — Other Ambulatory Visit: Payer: Self-pay

## 2021-01-16 ENCOUNTER — Inpatient Hospital Stay: Payer: BC Managed Care – PPO

## 2021-01-16 VITALS — BP 123/61 | HR 74 | Temp 98.0°F | Resp 16 | Ht 66.0 in | Wt 199.5 lb

## 2021-01-16 DIAGNOSIS — Z170 Estrogen receptor positive status [ER+]: Secondary | ICD-10-CM | POA: Insufficient documentation

## 2021-01-16 DIAGNOSIS — Z5112 Encounter for antineoplastic immunotherapy: Secondary | ICD-10-CM | POA: Insufficient documentation

## 2021-01-16 DIAGNOSIS — C50411 Malignant neoplasm of upper-outer quadrant of right female breast: Secondary | ICD-10-CM | POA: Insufficient documentation

## 2021-01-16 DIAGNOSIS — Z510 Encounter for antineoplastic radiation therapy: Secondary | ICD-10-CM | POA: Insufficient documentation

## 2021-01-16 MED ORDER — SODIUM CHLORIDE 0.9 % IV SOLN
Freq: Once | INTRAVENOUS | Status: AC
Start: 2021-01-16 — End: 2021-01-16

## 2021-01-16 MED ORDER — SODIUM CHLORIDE 0.9 % IV SOLN
420.0000 mg | Freq: Once | INTRAVENOUS | Status: AC
  Administered 2021-01-16: 420 mg via INTRAVENOUS
  Filled 2021-01-16: qty 14

## 2021-01-16 MED ORDER — HEPARIN SOD (PORK) LOCK FLUSH 100 UNIT/ML IV SOLN
500.0000 [IU] | Freq: Once | INTRAVENOUS | Status: AC | PRN
  Administered 2021-01-16: 500 [IU]

## 2021-01-16 MED ORDER — DIPHENHYDRAMINE HCL 25 MG PO CAPS
25.0000 mg | ORAL_CAPSULE | Freq: Once | ORAL | Status: AC
  Administered 2021-01-16: 25 mg via ORAL
  Filled 2021-01-16: qty 1

## 2021-01-16 MED ORDER — TRASTUZUMAB-DKST CHEMO 150 MG IV SOLR
6.0000 mg/kg | Freq: Once | INTRAVENOUS | Status: AC
  Administered 2021-01-16: 546 mg via INTRAVENOUS
  Filled 2021-01-16: qty 26

## 2021-01-16 MED ORDER — ACETAMINOPHEN 325 MG PO TABS
650.0000 mg | ORAL_TABLET | Freq: Once | ORAL | Status: AC
  Administered 2021-01-16: 650 mg via ORAL
  Filled 2021-01-16: qty 2

## 2021-01-16 MED ORDER — SODIUM CHLORIDE 0.9% FLUSH
10.0000 mL | INTRAVENOUS | Status: DC | PRN
  Administered 2021-01-16: 10 mL

## 2021-01-16 NOTE — Patient Instructions (Signed)
[facility]   Discharge Instructions: Thank you for choosing [facility] to provide your oncology and hematology care.   If you have a lab appointment with the [facility], please go directly to the [facility] and check in at the registration area.   Wear comfortable clothing and clothing appropriate for easy access to any Portacath or PICC line.   We strive to give you quality time with your provider. You may need to reschedule your appointment if you arrive late (15 or more minutes).  Arriving late affects you and other patients whose appointments are after yours.  Also, if you miss three or more appointments without notifying the office, you may be dismissed from the clinic at the provider's discretion.      For prescription refill requests, have your pharmacy contact our office and allow 72 hours for refills to be completed.    Today you received the following chemotherapy and/or immunotherapy agents: Trastuzumab (Ogivri) and Pertuzumab (Perjeta).       To help prevent nausea and vomiting after your treatment, we encourage you to take your nausea medication as directed.  BELOW ARE SYMPTOMS THAT SHOULD BE REPORTED IMMEDIATELY: *FEVER GREATER THAN 100.4 F (38 C) OR HIGHER *CHILLS OR SWEATING *NAUSEA AND VOMITING THAT IS NOT CONTROLLED WITH YOUR NAUSEA MEDICATION *UNUSUAL SHORTNESS OF BREATH *UNUSUAL BRUISING OR BLEEDING *URINARY PROBLEMS (pain or burning when urinating, or frequent urination) *BOWEL PROBLEMS (unusual diarrhea, constipation, pain near the anus) TENDERNESS IN MOUTH AND THROAT WITH OR WITHOUT PRESENCE OF ULCERS (sore throat, sores in mouth, or a toothache) UNUSUAL RASH, SWELLING OR PAIN  UNUSUAL VAGINAL DISCHARGE OR ITCHING   Items with * indicate a potential emergency and should be followed up as soon as possible or go to the Emergency Department if any problems should occur.  Please show the CHEMOTHERAPY ALERT CARD or  IMMUNOTHERAPY ALERT CARD at check-in to the Emergency Department and triage nurse.  Should you have questions after your visit or need to cancel or reschedule your appointment, please contact [facility]  Dept: [phone]  and follow the prompts.  Office hours are 8:00 a.m. to 4:30 p.m. Monday - Friday. Please note that voicemails left after 4:00 p.m. may not be returned until the following business day.  We are closed weekends and major holidays. You have access to a nurse at all times for urgent questions. Please call the main number to the clinic Dept: [phone] and follow the prompts.   For any non-urgent questions, you may also contact your provider using MyChart. We now offer e-Visits for anyone 87 and older to request care online for non-urgent symptoms. For details visit [email or web address].   Also download the MyChart app! Go to the app store, search "MyChart", open the app, select [facility], and log in with your MyChart username and password.  Due to Covid, a mask is required upon entering the hospital/clinic. If you do not have a mask, one will be given to you upon arrival. For doctor visits, patients may have 1 support person aged 92 or older with them. For treatment visits, patients cannot have anyone with them due to current Covid guidelines and our immunocompromised population.

## 2021-01-22 ENCOUNTER — Other Ambulatory Visit: Payer: Self-pay

## 2021-01-22 ENCOUNTER — Ambulatory Visit: Payer: BC Managed Care – PPO | Attending: General Surgery | Admitting: Physical Therapy

## 2021-01-22 ENCOUNTER — Encounter: Payer: Self-pay | Admitting: Physical Therapy

## 2021-01-22 DIAGNOSIS — C50411 Malignant neoplasm of upper-outer quadrant of right female breast: Secondary | ICD-10-CM | POA: Diagnosis present

## 2021-01-22 DIAGNOSIS — Z170 Estrogen receptor positive status [ER+]: Secondary | ICD-10-CM | POA: Diagnosis present

## 2021-01-22 DIAGNOSIS — R293 Abnormal posture: Secondary | ICD-10-CM | POA: Diagnosis not present

## 2021-01-22 NOTE — Therapy (Signed)
[facility] @ [place], [place], [address] Phone: [phone]   Fax:  [phone]  Physical Therapy Treatment  Patient Details  Name: Sandy Goodwin MRN: [id number] Date of Birth: 1971-01-28 Referring Provider (PT): Dr. Rolm Bookbinder   Encounter Date: 01/22/2021   PT End of Session - 01/22/21 1035     Visit Number 2    Number of Visits 2    Date for PT Re-Evaluation 01/16/21    PT Start Time 1008    PT Stop Time 1031    PT Time Calculation (min) 23 min    Activity Tolerance Patient tolerated treatment well    Behavior During Therapy [facility] for tasks assessed/performed             Past Medical History:  Diagnosis Date   Arthritis    Breast cancer ([place])    Complication of anesthesia    Itching   Dysrhythmia    Tachycardia   Hypothyroidism     Past Surgical History:  Procedure Laterality Date   ABDOMINAL HYSTERECTOMY     APPENDECTOMY  2008   Bowel Rupture Repair  2008   Ruptured during appendectomy   Breast Biopsy Right 1999   BREAST LUMPECTOMY WITH RADIOACTIVE SEED AND SENTINEL LYMPH NODE BIOPSY Right 12/11/2020   Procedure: RIGHT BREAST LUMPECTOMY WITH RADIOACTIVE SEED X2 AND RIGHT AXILLARY SENTINEL LYMPH NODE BIOPSY;  Surgeon: Rolm Bookbinder, MD;  Location: [facility];  Service: General;  Laterality: Right;   BREAST RECONSTRUCTION Right 12/20/2020   Procedure: RIGHT ONCOPLASTIC RECONSTRUCTION;  Surgeon: Irene Limbo, MD;  Location: [facility];  Service: Plastics;  Laterality: Right;   BREAST REDUCTION SURGERY Left 12/20/2020   Procedure: LEFT BREAST REDUCTION;  Surgeon: Irene Limbo, MD;  Location: [facility];  Service: Plastics;  Laterality: Left;   COLONOSCOPY N/A 06/15/2016   Procedure: COLONOSCOPY;  Surgeon: Rogene Houston, MD;  [facility];  Service: Endoscopy;  Laterality: N/A;  845   IBF  [id number]   INCISION AND  DRAINAGE OF WOUND Right 01/10/2021   Procedure: INCISION AND DRAINAGE OF RIGHT AXILLA;  Surgeon: Irene Limbo, MD;  Location: [facility];  Service: Plastics;  Laterality: Right;   PORTACATH PLACEMENT Left 07/23/2020   Procedure: INSERTION PORT-A-CATH;  Surgeon: Rolm Bookbinder, MD;  Location: [facility];  Service: General;  Laterality: Left;   THYROIDECTOMY, PARTIAL Bilateral 2012    There were no vitals filed for this visit.   Subjective Assessment - 01/22/21 1009     Subjective I completed chemo in August and I have had several surgeries. I have an infection but it is clearing up. I had an absess that had to be drained. I had decreased ROM with the infection but that is improved.    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 There are 4.1 cm of calcifications.with a Ki67 of 25%; pt complete chemo in Aug 2022, 12/11/20 - R breast lumpectomy and SLNB (0/3), 12/20/20- oncoplasty on R and breast reduction on the L, 9/30 pt had to have another surgery to drain the R axilla which had an absess, pt will undergo radiation soon    Patient Stated Goals Reduce lymphedema risk and learn post op shoulder ROM HEP    Currently in Pain? No/denies    Pain Score 0-No pain  [facility] PT Assessment - 01/22/21 0001       Assessment   Onset Date/Surgical Date 12/11/20      Precautions   Precautions Other (comment)    Precaution Comments at risk for lymphedema      Balance Screen   Has the patient fallen in the past 6 months No    Has the patient had a decrease in activity level because of a fear of falling?  No    Is the patient reluctant to leave their home because of a fear of falling?  No      Prior Function   Level of Independence Independent    Vocation On disability   short term   [employer]    Leisure was exercising until she had the axillary  infection but then stopped, pt is trying to get back to walking      Cognition   Overall Cognitive Status Within Functional Limits for tasks assessed      Posture/Postural Control   Posture/Postural Control Postural limitations    Postural Limitations Rounded Shoulders;Forward head      AROM   Right Shoulder Extension 87 Degrees    Right Shoulder Flexion 176 Degrees    Right Shoulder ABduction 180 Degrees    Right Shoulder Internal Rotation 76 Degrees    Right Shoulder External Rotation 109 Degrees    Left Shoulder Extension 85 Degrees    Left Shoulder Flexion 180 Degrees    Left Shoulder ABduction 178 Degrees    Left Shoulder Internal Rotation 80 Degrees    Left Shoulder External Rotation 93 Degrees               LYMPHEDEMA/ONCOLOGY QUESTIONNAIRE - 01/22/21 0001       Type   Cancer Type Right breast cancer      Surgeries   Lumpectomy Date 12/11/20    Sentinel Lymph Node Biopsy Date 12/11/20    Number Lymph Nodes Removed 3      Treatment   Past Chemotherapy Treatment Yes    Active Radiation Treatment --   will begin soon     What other symptoms do you have   Are you Having Heaviness or Tightness No    Are you having Pain No    Are you having pitting edema No    Is it Hard or Difficult finding clothes that fit No    Do you have infections Yes    Comments axillary biopsy incision infection that required surgery    Is there Decreased scar mobility --   scar still healing     Right Upper Extremity Lymphedema   10 cm Proximal to Olecranon Process 31 cm    Olecranon Process 26.5 cm    10 cm Proximal to Ulnar Styloid Process 22.3 cm    Just Proximal to Ulnar Styloid Process 17.5 cm    Across Hand at [employer] 18.9 cm    At [place] of 2nd Digit 6.1 cm      Left Upper Extremity Lymphedema   10 cm Proximal to Olecranon Process 32.8 cm    Olecranon Process 27.9 cm    10 cm Proximal to Ulnar Styloid Process 23.5 cm    Just Proximal to Ulnar Styloid Process 17.6  cm    Across Hand at [employer] 19.3 cm    At [place] of 2nd Digit 6.1 cm  PT Long Term Goals - 01/22/21 1042       PT LONG TERM GOAL #1   Title Patient will demonstrate she has regained full shoulder ROM and function post operatively compared to baselines.    Time 6    Period Months    Status Achieved                   Plan - 01/22/21 1036     Clinical Impression Statement Pt returns to PT for a post op follow up after undergoing a R breast lumpectomy and SLNB (0/3) on 12/11/20. She completed neoadjuvant chemo. She has R oncoplastic reconstruction and L breast reduction on 12/20/20. She developed an infection in her axillary scar that required sugery on 01/10/21. Pt has returned to baseline ROM and is not demonstrating any signs or symptoms of lymphedema. Signed pt up for 3 month post op [facility] and After Breast Cancer class. Pt has no more needs for skilled PT therapy at this time and will be discharged. She was educated to seek a referral to return if she has any tightness that develops with radiation or any signs of swelling.    PT Frequency --   eval and 1 f/u visit   PT Treatment/Interventions ADLs/Self Care Home Management;Therapeutic exercise;Patient/family education    PT Next Visit Plan continue 3 month L-dex screenings for 2 years post op    PT Home Exercise Plan Post op shoulder ROM HEP    Consulted and Agree with Plan of Care Patient             Patient will benefit from skilled therapeutic intervention in order to improve the following deficits and impairments:  Postural dysfunction, Decreased range of motion, Decreased knowledge of precautions, Impaired UE functional use, Pain  Visit Diagnosis: Abnormal posture  Malignant neoplasm of upper-outer quadrant of right breast in female, estrogen receptor positive [facility])     Problem List Patient Active Problem List   Diagnosis Date Noted   Genetic  testing 07/17/2020   Malignant neoplasm of upper-outer quadrant of right breast in female, estrogen receptor positive ([place]) 07/12/2020   Change in bowel habits 06/12/2016    Allyson Sabal [place], PT 01/22/2021, 10:42 AM  [facility] @ [place], [place], [address] Phone: [phone]   Fax:  [phone]  Name: CHERAE MARTON MRN: [id number] Date of Birth: 02/09/1971   Manus Gunning, PT 01/22/21 10:42 AM  PHYSICAL THERAPY DISCHARGE SUMMARY  Visits from Start of Care: 2  Current functional level related to goals / functional outcomes: All goals met   Remaining deficits: None   Education / Equipment: HEP   Patient agrees to discharge. Patient goals were met. Patient is being discharged due to meeting the stated rehab goals.  Allyson Sabal [place], [place] 01/22/21 10:42 AM

## 2021-01-23 ENCOUNTER — Ambulatory Visit
Admission: RE | Admit: 2021-01-23 | Discharge: 2021-01-23 | Disposition: A | Discharge status: Home / Self Care | Payer: BC Managed Care – PPO | Source: Ambulatory Visit | Attending: Radiation Oncology | Admitting: Radiation Oncology

## 2021-01-23 ENCOUNTER — Encounter: Payer: Self-pay | Admitting: *Deleted

## 2021-01-23 ENCOUNTER — Encounter: Payer: Self-pay | Admitting: Radiation Oncology

## 2021-01-23 VITALS — BP 119/52 | HR 68 | Temp 98.4°F | Resp 20 | Ht 66.5 in | Wt 201.6 lb

## 2021-01-23 DIAGNOSIS — Z5112 Encounter for antineoplastic immunotherapy: Secondary | ICD-10-CM | POA: Insufficient documentation

## 2021-01-23 DIAGNOSIS — Z170 Estrogen receptor positive status [ER+]: Secondary | ICD-10-CM

## 2021-01-23 DIAGNOSIS — R000 Tachycardia, unspecified: Secondary | ICD-10-CM | POA: Insufficient documentation

## 2021-01-23 DIAGNOSIS — C50411 Malignant neoplasm of upper-outer quadrant of right female breast: Secondary | ICD-10-CM | POA: Insufficient documentation

## 2021-01-23 DIAGNOSIS — E039 Hypothyroidism, unspecified: Secondary | ICD-10-CM | POA: Insufficient documentation

## 2021-01-23 DIAGNOSIS — Z510 Encounter for antineoplastic radiation therapy: Secondary | ICD-10-CM | POA: Insufficient documentation

## 2021-01-23 DIAGNOSIS — Z806 Family history of leukemia: Secondary | ICD-10-CM | POA: Insufficient documentation

## 2021-01-23 DIAGNOSIS — Z803 Family history of malignant neoplasm of breast: Secondary | ICD-10-CM | POA: Diagnosis not present

## 2021-01-23 DIAGNOSIS — M129 Arthropathy, unspecified: Secondary | ICD-10-CM | POA: Diagnosis not present

## 2021-01-23 MED ORDER — ALRA NON-METALLIC DEODORANT (RAD-ONC)
1.0000 | Freq: Once | TOPICAL | Status: AC
Start: 2021-01-23 — End: 2021-01-23
  Administered 2021-01-23: 1 via TOPICAL

## 2021-01-23 MED ORDER — RADIAPLEXRX EX GEL: Freq: Once | CUTANEOUS | Status: AC

## 2021-01-23 NOTE — Progress Notes (Signed)
Radiation Oncology         [phone] ________________________________  Name: Sandy Goodwin        MRN: [id number]  Date of Service: 01/23/2021 DOB: 03/02/1971  OH:YWVPXT, Mitzie Na, MD  Nicholas Lose, MD     REFERRING PHYSICIAN: Nicholas Lose, MD   DIAGNOSIS: The encounter diagnosis was Malignant neoplasm of upper-outer quadrant of right breast in female, estrogen receptor positive ([place]).   HISTORY OF PRESENT ILLNESS: Sandy Goodwin is a 50 y.o. female originially seen in the multidisciplinary breast clinic for a new diagnosis of right breast cancer.  The patient was found to have screening detected calcifications in the right breast as well as a left breast distortion.  She had diagnostic work-up which showed a 4.1 cm grouping of calcifications in the right breast and her axilla was negative for any adenopathy.  The left distortion resolved, and she underwent MRI scan that showed a concern for calcifications within the area of non-mass enhancement measuring 4.1 cm.  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   Since her last visit, she proceeded with MRI of the breasts on 07/12/2020 showing 4.1 cm of non-mass enhancement in the upper central right breast this correlated to her mammographic imaging, tissue marker clips were located in the anterior and posterior aspects of the enhancement and no new findings were seen in the left breast, no adenopathy was seen in either axilla.  She began systemic chemotherapy in the neoadjuvant setting on 07/24/2020 she completed this on 11/15/2020 she has continued with HER2 therapy Perjeta and Ogivri with her last infusion on 01/16/2021.  She underwent repeat MRI for comparison on 11/16/2020 showing previous enhancement resolved in the right breast and focal spiculated enhancement in the 6 o'clock  position of the left breast.  It was recommended that she undergo biopsy of this and fibrocystic change with focal fibroadenomatoid change without malignancy was seen in that specimen from 11/21/2020.  She then underwent right breast lumpectomy with sentinel lymph node biopsy, final pathology revealed scattered foci of DCIS high-grade with calcifications but complete pathologic response was noted for the invasive component without any residual disease, her margins were negative for DCIS and fibrocystic change with calcifications were seen in the right specimen.  She also had additional medial and posterior margin the medial margin showed atypical lobular hyperplasia and fibrocystic change negative for carcinoma the posterior margin was consistent with fibrocystic change and 3 sampled lymph nodes were negative for carcinoma.  She also elected for oncoplastic mammoplasty with left breast reduction this was performed on 12/20/2020 and final pathology revealed benign breast tissue with fibrocystic change in the left breast and fibroadenomatoid and fibrocystic change in the remaining specimen on the right breast.  She is healed from this perspective and is ready to to proceed with adjuvant radiotherapy.  She is seen today to discuss this.   PREVIOUS RADIATION THERAPY: No   PAST MEDICAL HISTORY:  Past Medical History:  Diagnosis Date   Arthritis    Breast cancer ([place])    Complication of anesthesia    Itching   Dysrhythmia    Tachycardia   Hypothyroidism        PAST SURGICAL HISTORY: Past Surgical History:  Procedure Laterality Date   ABDOMINAL HYSTERECTOMY     APPENDECTOMY  2008   Bowel Rupture  Repair  2008   Ruptured during appendectomy   Breast Biopsy Right 1999   BREAST LUMPECTOMY WITH RADIOACTIVE SEED AND SENTINEL LYMPH NODE BIOPSY Right 12/11/2020   Procedure: RIGHT BREAST LUMPECTOMY WITH RADIOACTIVE SEED X2 AND RIGHT AXILLARY SENTINEL LYMPH NODE BIOPSY;  Surgeon: Rolm Bookbinder, MD;   Location: [facility];  Service: General;  Laterality: Right;   BREAST RECONSTRUCTION Right 12/20/2020   Procedure: RIGHT ONCOPLASTIC RECONSTRUCTION;  Surgeon: Irene Limbo, MD;  Location: [facility];  Service: Plastics;  Laterality: Right;   BREAST REDUCTION SURGERY Left 12/20/2020   Procedure: LEFT BREAST REDUCTION;  Surgeon: Irene Limbo, MD;  Location: [facility];  Service: Plastics;  Laterality: Left;   COLONOSCOPY N/A 06/15/2016   Procedure: COLONOSCOPY;  Surgeon: Rogene Houston, MD;  [facility];  Service: Endoscopy;  Laterality: N/A;  845   IBF  [id number]   INCISION AND DRAINAGE OF WOUND Right 01/10/2021   Procedure: INCISION AND DRAINAGE OF RIGHT AXILLA;  Surgeon: Irene Limbo, MD;  Location: [facility];  Service: Plastics;  Laterality: Right;   PORTACATH PLACEMENT Left 07/23/2020   Procedure: INSERTION PORT-A-CATH;  Surgeon: Rolm Bookbinder, MD;  Location: [facility];  Service: General;  Laterality: Left;   THYROIDECTOMY, PARTIAL Bilateral 2012     FAMILY HISTORY:  Family History  Problem Relation Age of Onset   Hypertension Mother    Breast cancer Mother    Lymphoma Mother    Hypertension Brother    Breast cancer Maternal Aunt    Lymphoma Maternal Grandfather    Breast cancer Maternal Aunt      SOCIAL HISTORY:  reports that she has never smoked. She has never used smokeless tobacco. She reports that she does not drink alcohol and does not use drugs.  The patient is married and lives in [place].  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 She and her husband have two young daughters.    ALLERGIES: Codeine and Hydrocodone   MEDICATIONS:  Current Outpatient Medications  Medication Sig Dispense Refill   BYSTOLIC 5 MG tablet Take 1 tablet by mouth daily.  1   cetirizine (ZYRTEC) 10 MG tablet Take 10 mg by mouth daily.     clobetasol ointment (TEMOVATE) 0.05 %  Apply topically 2 (two) times daily.     famotidine (PEPCID) 10 MG tablet Take 10 mg by mouth 2 (two) times daily.     levothyroxine (SYNTHROID, LEVOTHROID) 50 MCG tablet Take 50 mcg by mouth daily before breakfast.     lidocaine (XYLOCAINE) 2 % solution 5 ml swish and spit q 3 hours prn mouth pain 200 mL 2   lidocaine-prilocaine (EMLA) cream APPLY TO THE AFFECTED AREA(S) DAILY 30 g 0   LORazepam (ATIVAN) 0.5 MG tablet Take 1 tablet (0.$RemoveBef'5mg'ZRmqGvkNRA$  tablet) at bedtime, as needed for nausea. 30 tablet 0   magic mouthwash (nystatin, diphenhydrAMINE, alum & mag hydroxide) suspension mixture Swish and spit 5 mLs 4 (four) times daily as needed for mouth pain. 240 mL 3   meloxicam (MOBIC) 15 MG tablet Take 15 mg by mouth daily.     metroNIDAZOLE (METROCREAM) 0.75 % cream Apply 1 application topically 2 (two) times daily.     phenazopyridine (PYRIDIUM) 200 MG tablet Take 1 tablet (200 mg total) by mouth 3 (three) times daily as needed for pain. 21 tablet 1   prochlorperazine (COMPAZINE) 10 MG tablet TAKE 1 TABLET BY MOUTH EVERY 6  HOURS AS NEEDED FOR NAUSEA OR FOR VOMITING 30 tablet 1   sulfamethoxazole-trimethoprim (BACTRIM DS) 800-160 MG tablet Take 1 tablet by mouth 2 (two) times daily. 10 tablet 0   traMADol (ULTRAM) 50 MG tablet Take 1 tablet (50 mg total) by mouth every 6 (six) hours as needed. 20 tablet 0   triamcinolone (NASACORT) 55 MCG/ACT AERO nasal inhaler Place 2 sprays into the nose daily.     Turmeric 500 MG CAPS Take by mouth.     valACYclovir (VALTREX) 1000 MG tablet Take 1 tablet (1,000 mg total) by mouth 2 (two) times daily. 14 tablet 2   No current facility-administered medications for this encounter.     REVIEW OF SYSTEMS: On review of systems, the patient reports that she is doing very well since completing chemotherapy. She reports she had minimal neuropathy in fingertips and toes that she felt was prevented by ice pack mittens and socks she wore during treatment. She feels like she is  doing very well and had fatigue during chemo but managed to still work during this phase. She has been on short term disability since her surgery. She reports her axillary infection has cleared nicely since the I&D and since completing bactrim. She denies any concerns with her incision sites or PAC site of the left chest wall. No other complaints are verbalized.      PHYSICAL EXAM:  Wt Readings from Last 3 Encounters:  01/16/21 199 lb 8 oz (90.5 kg)  01/10/21 197 lb 15.6 oz (89.8 kg)  12/20/20 201 lb 1 oz (91.2 kg)   Temp Readings from Last 3 Encounters:  01/16/21 98 F (36.7 C) (Oral)  01/10/21 97.6 F (36.4 C)  12/26/20 98.4 F (36.9 C)   BP Readings from Last 3 Encounters:  01/16/21 123/61  01/10/21 120/74  12/26/20 129/85   Pulse Readings from Last 3 Encounters:  01/16/21 74  01/10/21 68  12/26/20 68    In general this is a well appearing Caucasian female in no acute distress. She's alert and oriented x4 and appropriate throughout the examination. Cardiopulmonary assessment is negative for acute distress and she exhibits normal effort.  Bilateral breast exam incisions are well-healed consistent with mammoplasty incision locations without evidence of erythema, cellulitic streaking, bleeding or visible drainage. She does have about 3 mm of separation at the base of the keyhole incision of each breast where it meets the inframammary incision and fibrin is noted without erythema or drainage, but the right side does sometimes cause a spot of discharge she sees in her bra.     ECOG = 0  0 - Asymptomatic (Fully active, able to carry on all predisease activities without restriction)  1 - Symptomatic but completely ambulatory (Restricted in physically strenuous activity but ambulatory and able to carry out work of a light or sedentary nature. For example, light housework, office work)  2 - Symptomatic, <50% in bed during the day (Ambulatory and capable of all self care but unable to  carry out any work activities. Up and about more than 50% of waking hours)  3 - Symptomatic, >50% in bed, but not bedbound (Capable of only limited self-care, confined to bed or chair 50% or more of waking hours)  4 - Bedbound (Completely disabled. Cannot carry on any self-care. Totally confined to bed or chair)  5 - Death   Eustace Pen MM, Creech RH, Tormey DC, et al. [phone]). "Toxicity and response criteria of the [place] [employer]". [place] Oncol. 5 (  6): 649-55    LABORATORY DATA:  Lab Results  Component Value Date   WBC 6.0 12/26/2020   HGB 9.0 (L) 12/26/2020   HCT 28.3 (L) 12/26/2020   MCV 99.3 12/26/2020   PLT 223 12/26/2020   Lab Results  Component Value Date   NA 141 12/26/2020   K 4.1 12/26/2020   CL 109 12/26/2020   CO2 24 12/26/2020   Lab Results  Component Value Date   ALT 26 12/26/2020   AST 23 12/26/2020   ALKPHOS 90 12/26/2020   BILITOT <0.2 (L) 12/26/2020      RADIOGRAPHY: ECHOCARDIOGRAM COMPLETE  Result Date: 12/27/2020    ECHOCARDIOGRAM REPORT   Patient Name:   Isabell R Tramel Date of Exam: 12/27/2020 Medical Rec #:  [id number]  Height:       66.0 in Accession #:    [id number] Weight:       201.1 lb Date of Birth:  11-29-1970  BSA:          2.004 m Patient Age:    61 years   BP:           120/79 mmHg Patient Gender: F          HR:           65 bpm. Exam Location:  Outpatient Procedure: 2D Echo, 3D Echo, Cardiac Doppler, Color Doppler and Strain Analysis Indications:    Z51.11 Encounter for antineoplastic chemotheraphy  History:        Patient has prior history of Echocardiogram examinations, most                 recent 10/18/2020. Breast cancer.  Sonographer:    Roseanna Rainbow RDCS Referring Phys: [id number] Nicholas Lose  Sonographer Comments: Technically difficult study due to poor echo windows. Global longitudinal strain was attempted. IMPRESSIONS  1. GLS stable was -19.9 on 10/18/20. Left ventricular ejection fraction, by estimation, is 60 to 65%. The left  ventricle has normal function. The left ventricle has no regional wall motion abnormalities. Left ventricular diastolic parameters were normal. The average left ventricular global longitudinal strain is -20.6 %. The global longitudinal strain is normal.  2. Right ventricular systolic function is normal. The right ventricular size is normal.  3. The mitral valve is normal in structure. No evidence of mitral valve regurgitation. No evidence of mitral stenosis.  4. The aortic valve is tricuspid. Aortic valve regurgitation is not visualized. No aortic stenosis is present.  5. The inferior vena cava is normal in size with greater than 50% respiratory variability, suggesting right atrial pressure of 3 mmHg. FINDINGS  Left Ventricle: GLS stable was -19.9 on 10/18/20. Left ventricular ejection fraction, by estimation, is 60 to 65%. The left ventricle has normal function. The left ventricle has no regional wall motion abnormalities. The average left ventricular global longitudinal strain is -20.6 %. The global longitudinal strain is normal. The left ventricular internal cavity size was normal in size. There is no left ventricular hypertrophy. Left ventricular diastolic parameters were normal. Right Ventricle: The right ventricular size is normal. No increase in right ventricular wall thickness. Right ventricular systolic function is normal. Left Atrium: Left atrial size was normal in size. Right Atrium: Right atrial size was normal in size. Pericardium: There is no evidence of pericardial effusion. Mitral Valve: The mitral valve is normal in structure. No evidence of mitral valve regurgitation. No evidence of mitral valve stenosis. Tricuspid Valve: The tricuspid valve is normal in structure. Tricuspid valve regurgitation is  mild . No evidence of tricuspid stenosis. Aortic Valve: The aortic valve is tricuspid. Aortic valve regurgitation is not visualized. No aortic stenosis is present. Pulmonic Valve: The pulmonic valve was  normal in structure. Pulmonic valve regurgitation is not visualized. No evidence of pulmonic stenosis. Aorta: The aortic root is normal in size and structure. Venous: The inferior vena cava is normal in size with greater than 50% respiratory variability, suggesting right atrial pressure of 3 mmHg. IAS/Shunts: No atrial level shunt detected by color flow Doppler.  LEFT VENTRICLE PLAX 2D LVIDd:         5.30 cm      Diastology LVIDs:         3.50 cm      LV e' medial:    9.79 cm/s LV PW:         1.20 cm      LV E/e' medial:  11.4 LV IVS:        0.90 cm      LV e' lateral:   10.60 cm/s LVOT diam:     2.10 cm      LV E/e' lateral: 10.6 LV SV:         91 LV SV Index:   45           2D Longitudinal Strain LVOT Area:     3.46 cm     2D Strain GLS Avg:     -20.6 %  LV Volumes (MOD) LV vol d, MOD A2C: 107.0 ml LV vol d, MOD A4C: 111.0 ml LV vol s, MOD A2C: 41.6 ml LV vol s, MOD A4C: 47.3 ml LV SV MOD A2C:     65.4 ml LV SV MOD A4C:     111.0 ml LV SV MOD BP:      68.3 ml RIGHT VENTRICLE RV S prime:     13.10 cm/s TAPSE (M-mode): 2.8 cm LEFT ATRIUM             Index       RIGHT ATRIUM           Index LA diam:        2.20 cm 1.10 cm/m  RA Area:     14.10 cm LA Vol (A2C):   42.0 ml 20.95 ml/m RA Volume:   32.50 ml  16.22 ml/m LA Vol (A4C):   43.0 ml 21.45 ml/m LA Biplane Vol: 42.6 ml 21.25 ml/m  AORTIC VALVE LVOT Vmax:   121.00 cm/s LVOT Vmean:  80.500 cm/s LVOT VTI:    0.262 m  AORTA Ao Root diam: 3.20 cm Ao Asc diam:  3.50 cm MITRAL VALVE MV Area (PHT): 4.15 cm     SHUNTS MV Decel Time: 183 msec     Systemic VTI:  0.26 m MV E velocity: 112.00 cm/s  Systemic Diam: 2.10 cm MV A velocity: 64.50 cm/s MV E/A ratio:  1.74 Jenkins Rouge MD Electronically signed by Jenkins Rouge MD Signature Date/Time: 12/27/2020/10:27:39 AM    Final         IMPRESSION/PLAN: 1. Stage IB, cT2N0M0 grade 3, triple positive invasive ductal carcinoma of the right breast with complete response of invasive disease following neoadjuvant  chemotherapy. Dr. Lisbeth Renshaw discusses the pathology findings and reviews the rationale for external radiotherapy to the breast  to reduce risks of local recurrence followed by antiestrogen therapy. We discussed the risks, benefits, short, and long term effects of radiotherapy, as well as the curative intent. Dr. Lisbeth Renshaw discusses the delivery and logistics of  radiotherapy and anticipates a course of 6 1/2 weeks of radiotherapy to the right breast. Written consent is obtained and placed in the chart, a copy was provided to the patient. She will simulate today. She will let us know prior to her first treatment if there was still the small separation in the breast incision on the right in which case we would delay treatment another week.   In a visit lasting 45 minutes, greater than 50% of the time was spent face to face reviewing her case, as well as in preparation of, discussing, and coordinating the patient's care.  The above documentation reflects my direct findings during this shared patient visit. Please see the separate note by Dr. Lisbeth Renshaw on this date for the remainder of the patient's plan of care.    Carola Rhine, [facility]    **Disclaimer: This note was dictated with voice recognition software. Similar sounding words can inadvertently be transcribed and this note may contain transcription errors which may not have been corrected upon publication of note.**

## 2021-01-23 NOTE — Progress Notes (Signed)
Patient reports doing well and no concerning symptoms at this time.  Meaningful use complete.  BP (!) 119/52 (BP Location: Left Arm, Patient Position: Sitting, Cuff Size: Normal)   Pulse 68   Temp 98.4 F (36.9 C) (Oral)   Resp 20   Ht 5' 6.5" (1.689 m)   Wt 201 lb 9.6 oz (91.4 kg)   SpO2 97%   BMI 32.05 kg/m '

## 2021-01-23 NOTE — Progress Notes (Signed)

## 2021-01-30 ENCOUNTER — Ambulatory Visit: Payer: BC Managed Care – PPO | Admitting: Radiation Oncology

## 2021-01-31 ENCOUNTER — Ambulatory Visit: Payer: BC Managed Care – PPO

## 2021-02-02 DIAGNOSIS — Z5112 Encounter for antineoplastic immunotherapy: Secondary | ICD-10-CM | POA: Diagnosis not present

## 2021-02-03 ENCOUNTER — Ambulatory Visit: Payer: BC Managed Care – PPO

## 2021-02-03 ENCOUNTER — Other Ambulatory Visit: Payer: Self-pay

## 2021-02-03 ENCOUNTER — Ambulatory Visit
Admission: RE | Admit: 2021-02-03 | Discharge: 2021-02-03 | Disposition: A | Discharge status: Home / Self Care | Payer: BC Managed Care – PPO | Source: Ambulatory Visit | Attending: Radiation Oncology | Admitting: Radiation Oncology

## 2021-02-03 ENCOUNTER — Ambulatory Visit: Payer: BC Managed Care – PPO | Admitting: Radiation Oncology

## 2021-02-03 DIAGNOSIS — Z5112 Encounter for antineoplastic immunotherapy: Secondary | ICD-10-CM | POA: Diagnosis not present

## 2021-02-04 ENCOUNTER — Ambulatory Visit
Admission: RE | Admit: 2021-02-04 | Discharge: 2021-02-04 | Disposition: A | Discharge status: Home / Self Care | Payer: BC Managed Care – PPO | Source: Ambulatory Visit | Attending: Radiation Oncology | Admitting: Radiation Oncology

## 2021-02-04 ENCOUNTER — Ambulatory Visit: Payer: BC Managed Care – PPO

## 2021-02-04 ENCOUNTER — Encounter: Payer: Self-pay | Admitting: Radiation Oncology

## 2021-02-04 DIAGNOSIS — Z5112 Encounter for antineoplastic immunotherapy: Secondary | ICD-10-CM | POA: Diagnosis not present

## 2021-02-04 NOTE — Progress Notes (Signed)
Pt had left a VM last week about her healing. She felt like this improved over the weekened and came for her first treatment yesterday. I went back to the [facility] and examined her right breast incision sites and her mammoplasty incision has a dry small (less than 3 mm) area of dried eschar previously moist fibrin. We discussed that it appears well healed at this point to continue and that I agreed with her assessment.

## 2021-02-05 ENCOUNTER — Ambulatory Visit: Payer: BC Managed Care – PPO

## 2021-02-05 ENCOUNTER — Ambulatory Visit
Admission: RE | Admit: 2021-02-05 | Discharge: 2021-02-05 | Disposition: A | Discharge status: Home / Self Care | Payer: BC Managed Care – PPO | Source: Ambulatory Visit | Attending: Radiation Oncology | Admitting: Radiation Oncology

## 2021-02-05 ENCOUNTER — Other Ambulatory Visit: Payer: Self-pay

## 2021-02-05 DIAGNOSIS — Z5112 Encounter for antineoplastic immunotherapy: Secondary | ICD-10-CM | POA: Diagnosis not present

## 2021-02-05 NOTE — Progress Notes (Signed)
Patient Care Team: Caryl Bis, MD as PCP - General (Family Medicine) Mauro Kaufmann, RN as Oncology Nurse Navigator Rockwell Germany, RN as Oncology Nurse Navigator Rolm Bookbinder, MD as Consulting Physician (General Surgery) Nicholas Lose, MD as Consulting Physician (Hematology and Oncology) Kyung Rudd, MD as Consulting Physician (Radiation Oncology)  DIAGNOSIS:    ICD-10-CM   1. Malignant neoplasm of upper-outer quadrant of right breast in female, estrogen receptor positive ([place])  C50.411    Z17.0       SUMMARY OF ONCOLOGIC HISTORY: Oncology History  Malignant neoplasm of upper-outer quadrant of right breast in female, estrogen receptor positive ([place])  07/08/2020 Initial Diagnosis   Screening mammogram showed right breast calcifications. Diagnostic mammogram showed right breast calcifications spanning 4.1cm and no abnormal right axillary lymph nodes. 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   07/24/2020 - 11/15/2020 Neo-Adjuvant Chemotherapy   Taxotere, Carbo, Herceptin, Perjeta given every three weeks x 6   12/05/2020 -  Chemotherapy   Maintenance Herceptin/Perjeta every three weeks for one year       12/11/2020 Surgery   Right breast lumpectomy [name]): Scattered foci of high-grade DCIS with calcifications, no invasive cancer, pathologic complete response, margins negative, 0/3 lymph nodes negative, previously ER 80%, PR 40%, HER2 positive, Ki-67 25%   12/20/2020 Surgery   Reconstruction with Dr. Iran Planas     CHIEF COMPLIANT: Herceptin and Perjeta maintenance  INTERVAL HISTORY: Sandy Goodwin is a 50 y.o. with above-mentioned history of breast cancer currently on chemotherapy with Herceptin and Perjeta. She presents to the clinic today for maintenance Herceptin and Perjeta.   ALLERGIES:  is allergic to codeine and hydrocodone.  MEDICATIONS:  Current Outpatient Medications   Medication Sig Dispense Refill   BYSTOLIC 5 MG tablet Take 1 tablet by mouth daily.  1   cetirizine (ZYRTEC) 10 MG tablet Take 10 mg by mouth daily.     clobetasol ointment (TEMOVATE) 0.05 % Apply topically 2 (two) times daily.     famotidine (PEPCID) 10 MG tablet Take 10 mg by mouth 2 (two) times daily.     levothyroxine (SYNTHROID, LEVOTHROID) 50 MCG tablet Take 50 mcg by mouth daily before breakfast.     lidocaine-prilocaine (EMLA) cream APPLY TO THE AFFECTED AREA(S) DAILY 30 g 0   LORazepam (ATIVAN) 0.5 MG tablet Take 1 tablet (0.$RemoveBef'5mg'bOYGclgNDK$  tablet) at bedtime, as needed for nausea. 30 tablet 0   magic mouthwash (nystatin, diphenhydrAMINE, alum & mag hydroxide) suspension mixture Swish and spit 5 mLs 4 (four) times daily as needed for mouth pain. 240 mL 3   meloxicam (MOBIC) 15 MG tablet Take 15 mg by mouth daily.     metroNIDAZOLE (METROCREAM) 0.75 % cream Apply 1 application topically 2 (two) times daily.     triamcinolone (NASACORT) 55 MCG/ACT AERO nasal inhaler Place 2 sprays into the nose daily.     Turmeric 500 MG CAPS Take by mouth.     valACYclovir (VALTREX) 1000 MG tablet Take 1 tablet (1,000 mg total) by mouth 2 (two) times daily. 14 tablet 2   No current facility-administered medications for this visit.    PHYSICAL EXAMINATION: ECOG PERFORMANCE STATUS: 1 - Symptomatic but completely ambulatory  Vitals:   02/06/21 1046  BP: 114/66  Pulse: 71  Resp: 19  Temp: (!) 97.5 F (36.4 C)  SpO2: 99%   Filed Weights   02/06/21 1046  Weight: 203 lb 12.8 oz (  92.4 kg)     LABORATORY DATA:  I have reviewed the data as listed CMP Latest Ref Rng & Units 12/26/2020 12/06/2020 11/15/2020  Glucose 70 - 99 mg/dL 100(H) 114(H) 91  BUN 6 - 20 mg/dL $Remove'19 16 17  'vqaQPzG$ Creatinine 0.44 - 1.00 mg/dL 0.76 0.79 0.73  Sodium 135 - 145 mmol/L 141 140 141  Potassium 3.5 - 5.1 mmol/L 4.1 4.3 3.7  Chloride 98 - 111 mmol/L 109 106 107  CO2 22 - 32 mmol/L $RemoveB'24 25 25  'aBzLOSSI$ Calcium 8.9 - 10.3 mg/dL 8.7(L) 8.9 9.0   Total Protein 6.5 - 8.1 g/dL 6.5 6.9 6.8  Total Bilirubin 0.3 - 1.2 mg/dL <0.2(L) 0.3 0.3  Alkaline Phos 38 - 126 U/L 90 108 103  AST 15 - 41 U/L $Remo'23 20 22  'kglFa$ ALT 0 - 44 U/L 26 30 32    Lab Results  Component Value Date   WBC 4.4 02/06/2021   HGB 9.7 (L) 02/06/2021   HCT 30.7 (L) 02/06/2021   MCV 93.0 02/06/2021   PLT 236 02/06/2021   NEUTROABS 2.5 02/06/2021    ASSESSMENT & PLAN:  Malignant neoplasm of upper-outer quadrant of right breast in female, estrogen receptor positive ([place]) 07/08/2020: Screening mammogram showed right breast calcifications. Diagnostic mammogram showed right breast calcifications spanning 4.1cm and no abnormal right axillary lymph nodes. 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 Genetics done in 2019: Negative   Treatment plan: 1. Neoadjuvant chemotherapy with TCH Perjeta 6 cycles completed 11/15/2020 followed by Herceptin Perjeta maintenance for 1 year 2. 12/11/2020:Right breast lumpectomy [name]): Scattered foci of high-grade DCIS with calcifications, no invasive cancer, pathologic complete response, margins negative, 0/3 lymph nodes negative, previously ER 80%, PR 40%, HER2 positive, Ki-67 25% 3. Followed by adjuvant radiation therapy  started 02/04/2021 4.  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 URCC nausea study --------------------------------------------------------------------------------------------------------------------------------------- Current treatment: Herceptin and Perjeta maintenance. Toxicities: Tolerating it well without any problems. Echocardiogram 12/27/2020: EF 60 to 65% Fatigue: Being monitored closely Denies any diarrhea .  Return to clinic every 3 weeks for Herceptin Perjeta and every 6 weeks to follow-up with me   No orders of the defined types were placed in this  encounter.  The patient has a good understanding of the overall plan. she agrees with it. she will call with any problems that may develop before the next visit here.  Total time spent: 30 mins including face to face time and time spent for planning, charting and coordination of care  Rulon Eisenmenger, MD, MPH 02/06/2021  I, Thana Ates, am acting as scribe for Dr. Nicholas Lose.  I have reviewed the above documentation for accuracy and completeness, and I agree with the above.

## 2021-02-06 ENCOUNTER — Ambulatory Visit
Admission: RE | Admit: 2021-02-06 | Discharge: 2021-02-06 | Disposition: A | Discharge status: Home / Self Care | Payer: BC Managed Care – PPO | Source: Ambulatory Visit | Attending: Radiation Oncology | Admitting: Radiation Oncology

## 2021-02-06 ENCOUNTER — Ambulatory Visit: Payer: BC Managed Care – PPO

## 2021-02-06 ENCOUNTER — Other Ambulatory Visit: Payer: Self-pay

## 2021-02-06 ENCOUNTER — Inpatient Hospital Stay (HOSPITAL_BASED_OUTPATIENT_CLINIC_OR_DEPARTMENT_OTHER): Payer: BC Managed Care – PPO | Admitting: Hematology and Oncology

## 2021-02-06 ENCOUNTER — Inpatient Hospital Stay: Payer: BC Managed Care – PPO

## 2021-02-06 VITALS — BP 124/81 | HR 69 | Temp 97.9°F | Resp 16

## 2021-02-06 DIAGNOSIS — Z5112 Encounter for antineoplastic immunotherapy: Secondary | ICD-10-CM | POA: Diagnosis not present

## 2021-02-06 DIAGNOSIS — Z95828 Presence of other vascular implants and grafts: Secondary | ICD-10-CM | POA: Insufficient documentation

## 2021-02-06 DIAGNOSIS — C50411 Malignant neoplasm of upper-outer quadrant of right female breast: Secondary | ICD-10-CM

## 2021-02-06 DIAGNOSIS — Z170 Estrogen receptor positive status [ER+]: Secondary | ICD-10-CM

## 2021-02-06 LAB — CBC WITH DIFFERENTIAL (CANCER CENTER ONLY)
Abs Immature Granulocytes: 0.01 10*3/uL (ref 0.00–0.07)
Basophils Absolute: 0 10*3/uL (ref 0.0–0.1)
Basophils Relative: 1 %
Eosinophils Absolute: 0.1 10*3/uL (ref 0.0–0.5)
Eosinophils Relative: 2 %
HCT: 30.7 % — ABNORMAL LOW (ref 36.0–46.0)
Hemoglobin: 9.7 g/dL — ABNORMAL LOW (ref 12.0–15.0)
Immature Granulocytes: 0 %
Lymphocytes Relative: 34 %
Lymphs Abs: 1.5 10*3/uL (ref 0.7–4.0)
MCH: 29.4 pg (ref 26.0–34.0)
MCHC: 31.6 g/dL (ref 30.0–36.0)
MCV: 93 fL (ref 80.0–100.0)
Monocytes Absolute: 0.3 10*3/uL (ref 0.1–1.0)
Monocytes Relative: 7 %
Neutro Abs: 2.5 10*3/uL (ref 1.7–7.7)
Neutrophils Relative %: 56 %
Platelet Count: 236 10*3/uL (ref 150–400)
RBC: 3.3 MIL/uL — ABNORMAL LOW (ref 3.87–5.11)
RDW: 14.2 % (ref 11.5–15.5)
WBC Count: 4.4 10*3/uL (ref 4.0–10.5)
nRBC: 0 % (ref 0.0–0.2)

## 2021-02-06 LAB — CMP (CANCER CENTER ONLY)
ALT: 26 U/L (ref 0–44)
AST: 21 U/L (ref 15–41)
Albumin: 3.6 g/dL (ref 3.5–5.0)
Alkaline Phosphatase: 112 U/L (ref 38–126)
Anion gap: 10 (ref 5–15)
BUN: 20 mg/dL (ref 6–20)
CO2: 23 mmol/L (ref 22–32)
Calcium: 8.6 mg/dL — ABNORMAL LOW (ref 8.9–10.3)
Chloride: 107 mmol/L (ref 98–111)
Creatinine: 0.81 mg/dL (ref 0.44–1.00)
GFR, Estimated: >60 mL/min (ref >60)
Glucose, Bld: 159 mg/dL — ABNORMAL HIGH (ref 70–99)
Potassium: 3.7 mmol/L (ref 3.5–5.1)
Sodium: 140 mmol/L (ref 135–145)
Total Bilirubin: 0.3 mg/dL (ref 0.3–1.2)
Total Protein: 6.8 g/dL (ref 6.5–8.1)

## 2021-02-06 MED ORDER — TRASTUZUMAB-DKST CHEMO 150 MG IV SOLR
6.0000 mg/kg | Freq: Once | INTRAVENOUS | Status: AC
  Administered 2021-02-06: 546 mg via INTRAVENOUS
  Filled 2021-02-06: qty 26

## 2021-02-06 MED ORDER — SODIUM CHLORIDE 0.9% FLUSH
10.0000 mL | Freq: Once | INTRAVENOUS | Status: AC
  Administered 2021-02-06: 10 mL

## 2021-02-06 MED ORDER — SODIUM CHLORIDE 0.9 % IV SOLN
Freq: Once | INTRAVENOUS | Status: AC
Start: 2021-02-06 — End: 2021-02-06

## 2021-02-06 MED ORDER — SODIUM CHLORIDE 0.9 % IV SOLN
420.0000 mg | Freq: Once | INTRAVENOUS | Status: AC
  Administered 2021-02-06: 420 mg via INTRAVENOUS
  Filled 2021-02-06: qty 14

## 2021-02-06 MED ORDER — ACETAMINOPHEN 325 MG PO TABS
650.0000 mg | ORAL_TABLET | Freq: Once | ORAL | Status: AC
  Administered 2021-02-06: 650 mg via ORAL
  Filled 2021-02-06: qty 2

## 2021-02-06 MED ORDER — HEPARIN SOD (PORK) LOCK FLUSH 100 UNIT/ML IV SOLN
500.0000 [IU] | Freq: Once | INTRAVENOUS | Status: AC | PRN
  Administered 2021-02-06: 500 [IU]

## 2021-02-06 MED ORDER — SODIUM CHLORIDE 0.9% FLUSH
10.0000 mL | INTRAVENOUS | Status: DC | PRN
  Administered 2021-02-06: 10 mL

## 2021-02-06 MED ORDER — DIPHENHYDRAMINE HCL 25 MG PO CAPS
25.0000 mg | ORAL_CAPSULE | Freq: Once | ORAL | Status: AC
  Administered 2021-02-06: 25 mg via ORAL
  Filled 2021-02-06: qty 1

## 2021-02-06 NOTE — Assessment & Plan Note (Signed)
07/08/2020:Screening mammogram showed right breast calcifications. Diagnostic mammogram showed right breast calcifications spanning 4.1cm and no abnormal right axillary lymph nodes. 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 Genetics done in 2019: Negative  Treatment plan: 1. Neoadjuvant chemotherapy with TCH Perjeta 6 cycles completed 11/15/2020 followed by Herceptin Perjeta maintenance for 1 year 2. 12/11/2020:Right breast lumpectomy ([place]): Scattered foci of high-grade DCIS with calcifications, no invasive cancer, pathologic complete response, margins negative, 0/3 lymph nodes negative, previously ER 80%, PR 40%, HER2 positive, Ki-67 25% 3. Followed by adjuvant radiation therapy  started 02/04/2021 4.  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 URCCnausea study --------------------------------------------------------------------------------------------------------------------------------------- Current treatment: Herceptin and Perjeta maintenance. Toxicities: Tolerating it well without any problems.  Return to clinic every 3 weeks for Herceptin Perjeta and every 6 weeks to follow-up with me  

## 2021-02-06 NOTE — Patient Instructions (Signed)
[facility]  Discharge Instructions: Thank you for choosing [facility] to provide your oncology and hematology care.   If you have a lab appointment with the [facility], please go directly to the [facility] and check in at the registration area.   Wear comfortable clothing and clothing appropriate for easy access to any Portacath or PICC line.   We strive to give you quality time with your provider. You may need to reschedule your appointment if you arrive late (15 or more minutes).  Arriving late affects you and other patients whose appointments are after yours.  Also, if you miss three or more appointments without notifying the office, you may be dismissed from the clinic at the provider's discretion.      For prescription refill requests, have your pharmacy contact our office and allow 72 hours for refills to be completed.    Today you received the following chemotherapy and/or immunotherapy agents: Trastuzumab (Ogivri), Pertuzumab (Perjeta).   To help prevent nausea and vomiting after your treatment, we encourage you to take your nausea medication as directed.  BELOW ARE SYMPTOMS THAT SHOULD BE REPORTED IMMEDIATELY: *FEVER GREATER THAN 100.4 F (38 C) OR HIGHER *CHILLS OR SWEATING *NAUSEA AND VOMITING THAT IS NOT CONTROLLED WITH YOUR NAUSEA MEDICATION *UNUSUAL SHORTNESS OF BREATH *UNUSUAL BRUISING OR BLEEDING *URINARY PROBLEMS (pain or burning when urinating, or frequent urination) *BOWEL PROBLEMS (unusual diarrhea, constipation, pain near the anus) TENDERNESS IN MOUTH AND THROAT WITH OR WITHOUT PRESENCE OF ULCERS (sore throat, sores in mouth, or a toothache) UNUSUAL RASH, SWELLING OR PAIN  UNUSUAL VAGINAL DISCHARGE OR ITCHING   Items with * indicate a potential emergency and should be followed up as soon as possible or go to the Emergency Department if any problems should occur.  Please show the CHEMOTHERAPY ALERT CARD or  IMMUNOTHERAPY ALERT CARD at check-in to the Emergency Department and triage nurse.  Should you have questions after your visit or need to cancel or reschedule your appointment, please contact [facility]  Dept: [phone]  and follow the prompts.  Office hours are 8:00 a.m. to 4:30 p.m. Monday - Friday. Please note that voicemails left after 4:00 p.m. may not be returned until the following business day.  We are closed weekends and major holidays. You have access to a nurse at all times for urgent questions. Please call the main number to the clinic Dept: [phone] and follow the prompts.   For any non-urgent questions, you may also contact your provider using MyChart. We now offer e-Visits for anyone 77 and older to request care online for non-urgent symptoms. For details visit [email or web address].   Also download the MyChart app! Go to the app store, search "MyChart", open the app, select [facility], and log in with your MyChart username and password.  Due to Covid, a mask is required upon entering the hospital/clinic. If you do not have a mask, one will be given to you upon arrival. For doctor visits, patients may have 1 support person aged 14 or older with them. For treatment visits, patients cannot have anyone with them due to current Covid guidelines and our immunocompromised population.

## 2021-02-07 ENCOUNTER — Ambulatory Visit: Payer: BC Managed Care – PPO

## 2021-02-07 ENCOUNTER — Ambulatory Visit
Admission: RE | Admit: 2021-02-07 | Discharge: 2021-02-07 | Disposition: A | Discharge status: Home / Self Care | Payer: BC Managed Care – PPO | Source: Ambulatory Visit | Attending: Radiation Oncology | Admitting: Radiation Oncology

## 2021-02-07 DIAGNOSIS — Z5112 Encounter for antineoplastic immunotherapy: Secondary | ICD-10-CM | POA: Diagnosis not present

## 2021-02-10 ENCOUNTER — Ambulatory Visit: Payer: BC Managed Care – PPO

## 2021-02-10 ENCOUNTER — Other Ambulatory Visit: Payer: Self-pay

## 2021-02-10 ENCOUNTER — Ambulatory Visit
Admission: RE | Admit: 2021-02-10 | Discharge: 2021-02-10 | Disposition: A | Discharge status: Home / Self Care | Payer: BC Managed Care – PPO | Source: Ambulatory Visit | Attending: Radiation Oncology | Admitting: Radiation Oncology

## 2021-02-10 DIAGNOSIS — Z5112 Encounter for antineoplastic immunotherapy: Secondary | ICD-10-CM | POA: Diagnosis not present

## 2021-02-11 ENCOUNTER — Ambulatory Visit
Admission: RE | Admit: 2021-02-11 | Discharge: 2021-02-11 | Disposition: A | Discharge status: Home / Self Care | Payer: BC Managed Care – PPO | Source: Ambulatory Visit | Attending: Radiation Oncology | Admitting: Radiation Oncology

## 2021-02-11 ENCOUNTER — Ambulatory Visit: Payer: BC Managed Care – PPO

## 2021-02-11 DIAGNOSIS — Z5112 Encounter for antineoplastic immunotherapy: Secondary | ICD-10-CM | POA: Diagnosis not present

## 2021-02-11 DIAGNOSIS — C50411 Malignant neoplasm of upper-outer quadrant of right female breast: Secondary | ICD-10-CM | POA: Insufficient documentation

## 2021-02-11 DIAGNOSIS — Z170 Estrogen receptor positive status [ER+]: Secondary | ICD-10-CM | POA: Insufficient documentation

## 2021-02-12 ENCOUNTER — Ambulatory Visit: Admission: RE | Admit: 2021-02-12 | Payer: BC Managed Care – PPO | Source: Ambulatory Visit

## 2021-02-12 ENCOUNTER — Ambulatory Visit: Payer: BC Managed Care – PPO

## 2021-02-12 ENCOUNTER — Other Ambulatory Visit: Payer: Self-pay

## 2021-02-13 ENCOUNTER — Ambulatory Visit: Payer: BC Managed Care – PPO

## 2021-02-13 ENCOUNTER — Ambulatory Visit
Admission: RE | Admit: 2021-02-13 | Discharge: 2021-02-13 | Disposition: A | Discharge status: Home / Self Care | Payer: BC Managed Care – PPO | Source: Ambulatory Visit | Attending: Radiation Oncology | Admitting: Radiation Oncology

## 2021-02-13 DIAGNOSIS — Z5112 Encounter for antineoplastic immunotherapy: Secondary | ICD-10-CM | POA: Diagnosis not present

## 2021-02-14 ENCOUNTER — Ambulatory Visit: Payer: BC Managed Care – PPO

## 2021-02-14 ENCOUNTER — Other Ambulatory Visit: Payer: Self-pay

## 2021-02-14 ENCOUNTER — Ambulatory Visit
Admission: RE | Admit: 2021-02-14 | Discharge: 2021-02-14 | Disposition: A | Discharge status: Home / Self Care | Payer: BC Managed Care – PPO | Source: Ambulatory Visit | Attending: Radiation Oncology | Admitting: Radiation Oncology

## 2021-02-14 DIAGNOSIS — Z5112 Encounter for antineoplastic immunotherapy: Secondary | ICD-10-CM | POA: Diagnosis not present

## 2021-02-17 ENCOUNTER — Ambulatory Visit: Payer: BC Managed Care – PPO

## 2021-02-17 ENCOUNTER — Ambulatory Visit
Admission: RE | Admit: 2021-02-17 | Discharge: 2021-02-17 | Disposition: A | Discharge status: Home / Self Care | Payer: BC Managed Care – PPO | Source: Ambulatory Visit | Attending: Radiation Oncology | Admitting: Radiation Oncology

## 2021-02-17 ENCOUNTER — Other Ambulatory Visit: Payer: Self-pay

## 2021-02-17 DIAGNOSIS — Z5112 Encounter for antineoplastic immunotherapy: Secondary | ICD-10-CM | POA: Diagnosis not present

## 2021-02-18 ENCOUNTER — Ambulatory Visit: Payer: BC Managed Care – PPO

## 2021-02-18 ENCOUNTER — Ambulatory Visit
Admission: RE | Admit: 2021-02-18 | Discharge: 2021-02-18 | Disposition: A | Discharge status: Home / Self Care | Payer: BC Managed Care – PPO | Source: Ambulatory Visit | Attending: Radiation Oncology | Admitting: Radiation Oncology

## 2021-02-18 DIAGNOSIS — Z5112 Encounter for antineoplastic immunotherapy: Secondary | ICD-10-CM | POA: Diagnosis not present

## 2021-02-19 ENCOUNTER — Other Ambulatory Visit: Payer: Self-pay

## 2021-02-19 ENCOUNTER — Ambulatory Visit
Admission: RE | Admit: 2021-02-19 | Discharge: 2021-02-19 | Disposition: A | Discharge status: Home / Self Care | Payer: BC Managed Care – PPO | Source: Ambulatory Visit | Attending: Radiation Oncology | Admitting: Radiation Oncology

## 2021-02-19 ENCOUNTER — Ambulatory Visit: Payer: BC Managed Care – PPO

## 2021-02-19 DIAGNOSIS — Z5112 Encounter for antineoplastic immunotherapy: Secondary | ICD-10-CM | POA: Diagnosis not present

## 2021-02-20 ENCOUNTER — Ambulatory Visit: Payer: BC Managed Care – PPO

## 2021-02-20 ENCOUNTER — Ambulatory Visit
Admission: RE | Admit: 2021-02-20 | Discharge: 2021-02-20 | Disposition: A | Discharge status: Home / Self Care | Payer: BC Managed Care – PPO | Source: Ambulatory Visit | Attending: Radiation Oncology | Admitting: Radiation Oncology

## 2021-02-20 DIAGNOSIS — Z5112 Encounter for antineoplastic immunotherapy: Secondary | ICD-10-CM | POA: Diagnosis not present

## 2021-02-21 ENCOUNTER — Other Ambulatory Visit: Payer: Self-pay

## 2021-02-21 ENCOUNTER — Ambulatory Visit: Payer: BC Managed Care – PPO

## 2021-02-21 ENCOUNTER — Ambulatory Visit
Admission: RE | Admit: 2021-02-21 | Discharge: 2021-02-21 | Disposition: A | Discharge status: Home / Self Care | Payer: BC Managed Care – PPO | Source: Ambulatory Visit | Attending: Radiation Oncology | Admitting: Radiation Oncology

## 2021-02-21 DIAGNOSIS — Z5112 Encounter for antineoplastic immunotherapy: Secondary | ICD-10-CM | POA: Diagnosis not present

## 2021-02-24 ENCOUNTER — Other Ambulatory Visit: Payer: Self-pay

## 2021-02-24 ENCOUNTER — Ambulatory Visit: Payer: BC Managed Care – PPO

## 2021-02-24 ENCOUNTER — Ambulatory Visit
Admission: RE | Admit: 2021-02-24 | Discharge: 2021-02-24 | Disposition: A | Discharge status: Home / Self Care | Payer: BC Managed Care – PPO | Source: Ambulatory Visit | Attending: Radiation Oncology | Admitting: Radiation Oncology

## 2021-02-24 DIAGNOSIS — Z5112 Encounter for antineoplastic immunotherapy: Secondary | ICD-10-CM | POA: Diagnosis not present

## 2021-02-25 ENCOUNTER — Ambulatory Visit
Admission: RE | Admit: 2021-02-25 | Discharge: 2021-02-25 | Disposition: A | Discharge status: Home / Self Care | Payer: BC Managed Care – PPO | Source: Ambulatory Visit | Attending: Radiation Oncology | Admitting: Radiation Oncology

## 2021-02-25 ENCOUNTER — Ambulatory Visit: Payer: BC Managed Care – PPO

## 2021-02-25 DIAGNOSIS — Z5112 Encounter for antineoplastic immunotherapy: Secondary | ICD-10-CM | POA: Diagnosis not present

## 2021-02-26 ENCOUNTER — Other Ambulatory Visit: Payer: Self-pay

## 2021-02-26 ENCOUNTER — Inpatient Hospital Stay: Payer: BC Managed Care – PPO | Attending: Hematology and Oncology

## 2021-02-26 ENCOUNTER — Ambulatory Visit: Payer: BC Managed Care – PPO

## 2021-02-26 ENCOUNTER — Ambulatory Visit
Admission: RE | Admit: 2021-02-26 | Discharge: 2021-02-26 | Disposition: A | Discharge status: Home / Self Care | Payer: BC Managed Care – PPO | Source: Ambulatory Visit | Attending: Radiation Oncology | Admitting: Radiation Oncology

## 2021-02-26 ENCOUNTER — Inpatient Hospital Stay: Payer: BC Managed Care – PPO

## 2021-02-26 VITALS — BP 131/78 | HR 65 | Temp 98.4°F | Resp 16 | Wt 201.5 lb

## 2021-02-26 DIAGNOSIS — Z5112 Encounter for antineoplastic immunotherapy: Secondary | ICD-10-CM | POA: Diagnosis not present

## 2021-02-26 DIAGNOSIS — Z170 Estrogen receptor positive status [ER+]: Secondary | ICD-10-CM | POA: Insufficient documentation

## 2021-02-26 DIAGNOSIS — C50411 Malignant neoplasm of upper-outer quadrant of right female breast: Secondary | ICD-10-CM | POA: Insufficient documentation

## 2021-02-26 MED ORDER — SODIUM CHLORIDE 0.9 % IV SOLN: Freq: Once | INTRAVENOUS | Status: AC

## 2021-02-26 MED ORDER — HEPARIN SOD (PORK) LOCK FLUSH 100 UNIT/ML IV SOLN
500.0000 [IU] | Freq: Once | INTRAVENOUS | Status: AC | PRN
  Administered 2021-02-26: 500 [IU]

## 2021-02-26 MED ORDER — SODIUM CHLORIDE 0.9% FLUSH
10.0000 mL | INTRAVENOUS | Status: DC | PRN
  Administered 2021-02-26: 10 mL

## 2021-02-26 MED ORDER — SODIUM CHLORIDE 0.9 % IV SOLN
420.0000 mg | Freq: Once | INTRAVENOUS | Status: AC
  Administered 2021-02-26: 420 mg via INTRAVENOUS
  Filled 2021-02-26: qty 14

## 2021-02-26 MED ORDER — ACETAMINOPHEN 325 MG PO TABS
650.0000 mg | ORAL_TABLET | Freq: Once | ORAL | Status: AC
  Administered 2021-02-26: 650 mg via ORAL
  Filled 2021-02-26: qty 2

## 2021-02-26 MED ORDER — DIPHENHYDRAMINE HCL 25 MG PO CAPS
25.0000 mg | ORAL_CAPSULE | Freq: Once | ORAL | Status: AC
  Administered 2021-02-26: 25 mg via ORAL
  Filled 2021-02-26: qty 1

## 2021-02-26 MED ORDER — TRASTUZUMAB-DKST CHEMO 150 MG IV SOLR
6.0000 mg/kg | Freq: Once | INTRAVENOUS | Status: AC
  Administered 2021-02-26: 546 mg via INTRAVENOUS
  Filled 2021-02-26: qty 26

## 2021-02-26 NOTE — Patient Instructions (Signed)
[facility]  Discharge Instructions: Thank you for choosing [facility] to provide your oncology and hematology care.   If you have a lab appointment with the Cancer Center, please go directly to the Cancer Center and check in at the registration area.   Wear comfortable clothing and clothing appropriate for easy access to any Portacath or PICC line.   We strive to give you quality time with your provider. You may need to reschedule your appointment if you arrive late (15 or more minutes).  Arriving late affects you and other patients whose appointments are after yours.  Also, if you miss three or more appointments without notifying the office, you may be dismissed from the clinic at the provider's discretion.      For prescription refill requests, have your pharmacy contact our office and allow 72 hours for refills to be completed.    Today you received the following chemotherapy and/or immunotherapy agents: Ogivri/Perjeta.      To help prevent nausea and vomiting after your treatment, we encourage you to take your nausea medication as directed.  BELOW ARE SYMPTOMS THAT SHOULD BE REPORTED IMMEDIATELY: . *FEVER GREATER THAN 100.4 F (38 C) OR HIGHER . *CHILLS OR SWEATING . *NAUSEA AND VOMITING THAT IS NOT CONTROLLED WITH YOUR NAUSEA MEDICATION . *UNUSUAL SHORTNESS OF BREATH . *UNUSUAL BRUISING OR BLEEDING . *URINARY PROBLEMS (pain or burning when urinating, or frequent urination) . *BOWEL PROBLEMS (unusual diarrhea, constipation, pain near the anus) . TENDERNESS IN MOUTH AND THROAT WITH OR WITHOUT PRESENCE OF ULCERS (sore throat, sores in mouth, or a toothache) . UNUSUAL RASH, SWELLING OR PAIN  . UNUSUAL VAGINAL DISCHARGE OR ITCHING   Items with * indicate a potential emergency and should be followed up as soon as possible or go to the Emergency Department if any problems should occur.  Please show the CHEMOTHERAPY ALERT CARD or IMMUNOTHERAPY  ALERT CARD at check-in to the Emergency Department and triage nurse.  Should you have questions after your visit or need to cancel or reschedule your appointment, please contact  [facility]  Dept: [phone]  and follow the prompts.  Office hours are 8:00 a.m. to 4:30 p.m. Monday - Friday. Please note that voicemails left after 4:00 p.m. may not be returned until the following business day.  We are closed weekends and major holidays. You have access to a nurse at all times for urgent questions. Please call the main number to the clinic Dept: [phone] and follow the prompts.   For any non-urgent questions, you may also contact your provider using MyChart. We now offer e-Visits for anyone 18 and older to request care online for non-urgent symptoms. For details visit [email or web address].   Also download the MyChart app! Go to the app store, search "MyChart", open the app, select , and log in with your MyChart username and password.  Due to Covid, a mask is required upon entering the hospital/clinic. If you do not have a mask, one will be given to you upon arrival. For doctor visits, patients may have 1 support person aged 18 or older with them. For treatment visits, patients cannot have anyone with them due to current Covid guidelines and our immunocompromised population.   

## 2021-02-27 ENCOUNTER — Ambulatory Visit: Payer: BC Managed Care – PPO

## 2021-02-27 ENCOUNTER — Ambulatory Visit
Admission: RE | Admit: 2021-02-27 | Discharge: 2021-02-27 | Disposition: A | Discharge status: Home / Self Care | Payer: BC Managed Care – PPO | Source: Ambulatory Visit | Attending: Radiation Oncology | Admitting: Radiation Oncology

## 2021-02-27 DIAGNOSIS — Z5112 Encounter for antineoplastic immunotherapy: Secondary | ICD-10-CM | POA: Diagnosis not present

## 2021-02-27 DIAGNOSIS — C50411 Malignant neoplasm of upper-outer quadrant of right female breast: Secondary | ICD-10-CM

## 2021-02-27 MED ORDER — RADIAPLEXRX EX GEL: Freq: Once | CUTANEOUS | Status: AC

## 2021-02-28 ENCOUNTER — Ambulatory Visit: Payer: BC Managed Care – PPO

## 2021-03-03 ENCOUNTER — Ambulatory Visit: Payer: BC Managed Care – PPO

## 2021-03-03 ENCOUNTER — Ambulatory Visit
Admission: RE | Admit: 2021-03-03 | Discharge: 2021-03-03 | Disposition: A | Discharge status: Home / Self Care | Payer: BC Managed Care – PPO | Source: Ambulatory Visit | Attending: Radiation Oncology | Admitting: Radiation Oncology

## 2021-03-03 ENCOUNTER — Other Ambulatory Visit: Payer: Self-pay

## 2021-03-03 DIAGNOSIS — Z5112 Encounter for antineoplastic immunotherapy: Secondary | ICD-10-CM | POA: Diagnosis not present

## 2021-03-04 ENCOUNTER — Ambulatory Visit: Payer: BC Managed Care – PPO

## 2021-03-04 ENCOUNTER — Ambulatory Visit
Admission: RE | Admit: 2021-03-04 | Discharge: 2021-03-04 | Disposition: A | Discharge status: Home / Self Care | Payer: BC Managed Care – PPO | Source: Ambulatory Visit | Attending: Radiation Oncology | Admitting: Radiation Oncology

## 2021-03-04 DIAGNOSIS — Z5112 Encounter for antineoplastic immunotherapy: Secondary | ICD-10-CM | POA: Diagnosis not present

## 2021-03-05 ENCOUNTER — Ambulatory Visit: Payer: BC Managed Care – PPO

## 2021-03-05 ENCOUNTER — Other Ambulatory Visit: Payer: Self-pay

## 2021-03-05 ENCOUNTER — Ambulatory Visit
Admission: RE | Admit: 2021-03-05 | Discharge: 2021-03-05 | Disposition: A | Discharge status: Home / Self Care | Payer: BC Managed Care – PPO | Source: Ambulatory Visit | Attending: Radiation Oncology | Admitting: Radiation Oncology

## 2021-03-05 DIAGNOSIS — Z5112 Encounter for antineoplastic immunotherapy: Secondary | ICD-10-CM | POA: Diagnosis not present

## 2021-03-06 ENCOUNTER — Ambulatory Visit: Payer: BC Managed Care – PPO

## 2021-03-07 ENCOUNTER — Ambulatory Visit: Payer: BC Managed Care – PPO

## 2021-03-10 ENCOUNTER — Ambulatory Visit: Payer: BC Managed Care – PPO

## 2021-03-10 ENCOUNTER — Ambulatory Visit
Admission: RE | Admit: 2021-03-10 | Discharge: 2021-03-10 | Disposition: A | Discharge status: Home / Self Care | Payer: BC Managed Care – PPO | Source: Ambulatory Visit | Attending: Radiation Oncology | Admitting: Radiation Oncology

## 2021-03-10 ENCOUNTER — Other Ambulatory Visit: Payer: Self-pay

## 2021-03-10 DIAGNOSIS — Z5112 Encounter for antineoplastic immunotherapy: Secondary | ICD-10-CM | POA: Diagnosis not present

## 2021-03-11 ENCOUNTER — Ambulatory Visit: Payer: BC Managed Care – PPO

## 2021-03-11 ENCOUNTER — Ambulatory Visit
Admission: RE | Admit: 2021-03-11 | Discharge: 2021-03-11 | Disposition: A | Discharge status: Home / Self Care | Payer: BC Managed Care – PPO | Source: Ambulatory Visit | Attending: Radiation Oncology | Admitting: Radiation Oncology

## 2021-03-11 DIAGNOSIS — Z5112 Encounter for antineoplastic immunotherapy: Secondary | ICD-10-CM | POA: Diagnosis not present

## 2021-03-12 ENCOUNTER — Ambulatory Visit
Admission: RE | Admit: 2021-03-12 | Discharge: 2021-03-12 | Disposition: A | Discharge status: Home / Self Care | Payer: BC Managed Care – PPO | Source: Ambulatory Visit | Attending: Radiation Oncology | Admitting: Radiation Oncology

## 2021-03-12 ENCOUNTER — Other Ambulatory Visit: Payer: Self-pay

## 2021-03-12 ENCOUNTER — Ambulatory Visit: Payer: BC Managed Care – PPO

## 2021-03-12 DIAGNOSIS — Z5112 Encounter for antineoplastic immunotherapy: Secondary | ICD-10-CM | POA: Diagnosis not present

## 2021-03-13 ENCOUNTER — Ambulatory Visit: Payer: BC Managed Care – PPO

## 2021-03-13 ENCOUNTER — Ambulatory Visit
Admission: RE | Admit: 2021-03-13 | Discharge: 2021-03-13 | Disposition: A | Discharge status: Home / Self Care | Payer: BC Managed Care – PPO | Source: Ambulatory Visit | Attending: Radiation Oncology | Admitting: Radiation Oncology

## 2021-03-13 DIAGNOSIS — C50411 Malignant neoplasm of upper-outer quadrant of right female breast: Secondary | ICD-10-CM | POA: Diagnosis present

## 2021-03-13 DIAGNOSIS — Z170 Estrogen receptor positive status [ER+]: Secondary | ICD-10-CM | POA: Diagnosis not present

## 2021-03-13 DIAGNOSIS — Z5112 Encounter for antineoplastic immunotherapy: Secondary | ICD-10-CM | POA: Insufficient documentation

## 2021-03-13 DIAGNOSIS — Z510 Encounter for antineoplastic radiation therapy: Secondary | ICD-10-CM | POA: Insufficient documentation

## 2021-03-14 ENCOUNTER — Ambulatory Visit: Payer: BC Managed Care – PPO | Admitting: Radiation Oncology

## 2021-03-14 ENCOUNTER — Other Ambulatory Visit: Payer: Self-pay

## 2021-03-14 ENCOUNTER — Ambulatory Visit: Payer: BC Managed Care – PPO

## 2021-03-14 ENCOUNTER — Ambulatory Visit
Admission: RE | Admit: 2021-03-14 | Discharge: 2021-03-14 | Disposition: A | Discharge status: Home / Self Care | Payer: BC Managed Care – PPO | Source: Ambulatory Visit | Attending: Radiation Oncology | Admitting: Radiation Oncology

## 2021-03-14 DIAGNOSIS — Z5112 Encounter for antineoplastic immunotherapy: Secondary | ICD-10-CM | POA: Diagnosis not present

## 2021-03-17 ENCOUNTER — Ambulatory Visit: Payer: BC Managed Care – PPO | Admitting: Radiation Oncology

## 2021-03-17 ENCOUNTER — Ambulatory Visit: Payer: BC Managed Care – PPO

## 2021-03-17 ENCOUNTER — Ambulatory Visit
Admission: RE | Admit: 2021-03-17 | Discharge: 2021-03-17 | Disposition: A | Discharge status: Home / Self Care | Payer: BC Managed Care – PPO | Source: Ambulatory Visit | Attending: Radiation Oncology | Admitting: Radiation Oncology

## 2021-03-17 ENCOUNTER — Other Ambulatory Visit: Payer: Self-pay

## 2021-03-17 DIAGNOSIS — Z5112 Encounter for antineoplastic immunotherapy: Secondary | ICD-10-CM | POA: Diagnosis not present

## 2021-03-18 ENCOUNTER — Ambulatory Visit: Payer: BC Managed Care – PPO

## 2021-03-18 ENCOUNTER — Ambulatory Visit
Admission: RE | Admit: 2021-03-18 | Discharge: 2021-03-18 | Disposition: A | Discharge status: Home / Self Care | Payer: BC Managed Care – PPO | Source: Ambulatory Visit | Attending: Radiation Oncology | Admitting: Radiation Oncology

## 2021-03-18 DIAGNOSIS — Z5112 Encounter for antineoplastic immunotherapy: Secondary | ICD-10-CM | POA: Diagnosis not present

## 2021-03-19 ENCOUNTER — Ambulatory Visit
Admission: RE | Admit: 2021-03-19 | Discharge: 2021-03-19 | Disposition: A | Discharge status: Home / Self Care | Payer: BC Managed Care – PPO | Source: Ambulatory Visit | Attending: Radiation Oncology | Admitting: Radiation Oncology

## 2021-03-19 ENCOUNTER — Ambulatory Visit: Payer: BC Managed Care – PPO

## 2021-03-19 ENCOUNTER — Other Ambulatory Visit: Payer: Self-pay

## 2021-03-19 DIAGNOSIS — Z5112 Encounter for antineoplastic immunotherapy: Secondary | ICD-10-CM | POA: Diagnosis not present

## 2021-03-19 NOTE — Progress Notes (Signed)
[facility] Follow up:    Sandy Bis, MD [place] [address]   DIAGNOSIS:  Cancer Staging  Malignant neoplasm of upper-outer quadrant of right breast in female, estrogen receptor positive ([place]) Staging form: Breast, AJCC 8th Edition - Clinical stage from 07/17/2020: Stage IB (cT2, cN0, cM0, G3, ER+, PR+, HER2+) - Unsigned Stage prefix: Initial diagnosis Method of lymph node assessment: Clinical Histologic grading system: 3 grade system - Pathologic stage from 12/11/2020: No Stage Recommended (ypT0, pN0, cM0) - Signed by Gardenia Phlegm, NP on 03/19/2021 Stage prefix: Post-therapy   SUMMARY OF ONCOLOGIC HISTORY: Oncology History  Malignant neoplasm of upper-outer quadrant of right breast in female, estrogen receptor positive ([place])  07/08/2020 Initial Diagnosis   Screening mammogram showed right breast calcifications. Diagnostic mammogram showed right breast calcifications spanning 4.1cm and no abnormal right axillary lymph nodes. 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   07/24/2020 - 11/15/2020 Neo-Adjuvant Chemotherapy   Taxotere, Carbo, Herceptin, Perjeta given every three weeks x 6   12/05/2020 -  Chemotherapy   Maintenance Herceptin/Perjeta every three weeks for one year       12/11/2020 Surgery   Right breast lumpectomy [name]): Scattered foci of high-grade DCIS with calcifications, no invasive cancer, pathologic complete response, margins negative, 0/3 lymph nodes negative, previously ER 80%, PR 40%, HER2 positive, Ki-67 25%   12/11/2020 Cancer Staging   Staging form: Breast, AJCC 8th Edition - Pathologic stage from 12/11/2020: No Stage Recommended (ypT0, pN0, cM0) - Signed by Gardenia Phlegm, NP on 03/19/2021 Stage prefix: Post-therapy    12/20/2020 Surgery   Reconstruction with Dr. Iran Planas   02/04/2021 - 03/25/2021 Radiation Therapy    Adjuvant radiation     CURRENT THERAPY: Maintenance Herceptin and Perjeta; adjuvant radiation  INTERVAL HISTORY: Sandy Goodwin 50 y.o. female returns for evaluation prior to receiving her maintenance immunotherapy with Herceptin and Perjeta.  Her most recent echocardiogram was completed on December 27, 2020.  This showed a normal EF of 60 to 65% with a normal global longitudinal strain.  Her next echocardiogram is scheduled for March 24, 2021.  Sandy Goodwin is doing quite well today.  She notes that after this last cycle she did have increased diarrhea.  On 2 days she had 10-15 episodes.  She manages this well with hydration and electrolyte supplementation on the days of heavy diarrhea.  She notes that her fatigue is improving.  She is back in with her mammogram.  Year.  She does 1/2-day of face-to-face visits.  Then in the afternoons, she does E-visits.  She notes a pulling in her right axilla.  She is managing this with massage. This has not impacted her range of motion.  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  MEDICAL HISTORY: Past Medical History:  Diagnosis Date   Arthritis    Breast cancer ([place])    Complication of anesthesia    Itching   Dysrhythmia    Tachycardia  Hypothyroidism     SURGICAL HISTORY: Past Surgical History:  Procedure Laterality Date   ABDOMINAL HYSTERECTOMY     APPENDECTOMY  2008   Bowel Rupture Repair  2008   Ruptured during appendectomy   Breast Biopsy Right 1999   BREAST  LUMPECTOMY WITH RADIOACTIVE SEED AND SENTINEL LYMPH NODE BIOPSY Right 12/11/2020   Procedure: RIGHT BREAST LUMPECTOMY WITH RADIOACTIVE SEED X2 AND RIGHT AXILLARY SENTINEL LYMPH NODE BIOPSY;  Surgeon: Rolm Bookbinder, MD;  Location: [facility];  Service: General;  Laterality: Right;   BREAST RECONSTRUCTION Right 12/20/2020   Procedure: RIGHT ONCOPLASTIC RECONSTRUCTION;  Surgeon: Irene Limbo, MD;  Location: [facility];  Service: Plastics;  Laterality: Right;   BREAST REDUCTION SURGERY Left 12/20/2020   Procedure: LEFT BREAST REDUCTION;  Surgeon: Irene Limbo, MD;  Location: [facility];  Service: Plastics;  Laterality: Left;   COLONOSCOPY N/A 06/15/2016   Procedure: COLONOSCOPY;  Surgeon: Rogene Houston, MD;  [facility];  Service: Endoscopy;  Laterality: N/A;  845   IBF  [id number]   INCISION AND DRAINAGE OF WOUND Right 01/10/2021   Procedure: INCISION AND DRAINAGE OF RIGHT AXILLA;  Surgeon: Irene Limbo, MD;  Location: [facility];  Service: Plastics;  Laterality: Right;   PORTACATH PLACEMENT Left 07/23/2020   Procedure: INSERTION PORT-A-CATH;  Surgeon: Rolm Bookbinder, MD;  Location: [facility];  Service: General;  Laterality: Left;   THYROIDECTOMY, PARTIAL Bilateral 2012    SOCIAL HISTORY: Social History   Socioeconomic History   Marital status: Married    Spouse name: Not on file   Number of children: Not on file   Years of education: Not on file   Highest education level: Not on file  Occupational History   Not on file  Tobacco Use   Smoking status: Never   Smokeless tobacco: Never  Vaping Use   Vaping Use: Never used  Substance and Sexual Activity   Alcohol use: No   Drug use: No   Sexual activity: Yes  Other Topics Concern   Not on file  Social History Narrative   Not on file   Social Determinants of Health   Financial Resource Strain: Not on file  Food Insecurity: Not  on file  Transportation Needs: Not on file  Physical Activity: Not on file  Stress: Not on file  Social Connections: Not on file  Intimate Partner Violence: Not on file    FAMILY HISTORY: Family History  Problem Relation Age of Onset   Hypertension Mother    Breast cancer Mother    Lymphoma Mother    Hypertension Brother    Breast cancer Maternal Aunt    Lymphoma Maternal Grandfather    Breast cancer Maternal Aunt     Review of Systems  Constitutional:  Positive for fatigue (Mild and improving). Negative for appetite change, chills, fever and unexpected weight change.  HENT:   Negative for hearing loss, lump/mass and trouble swallowing.   Eyes:  Negative for eye problems and icterus.  Respiratory:  Negative for chest tightness, cough and shortness of breath.   Cardiovascular:  Negative for chest pain, leg swelling and palpitations.  Gastrointestinal:  Negative for abdominal distention, abdominal pain, constipation, diarrhea, nausea and vomiting.  Endocrine: Negative for hot flashes.  Genitourinary:  Negative for difficulty urinating.   Musculoskeletal:  Negative for arthralgias.  Skin:  Negative for itching and rash.  Neurological:  Negative for dizziness, extremity weakness, headaches and numbness.  Hematological:  Negative for adenopathy. Does not bruise/bleed easily.  Psychiatric/Behavioral:  Negative for depression. The patient is not nervous/anxious.      PHYSICAL EXAMINATION  ECOG PERFORMANCE STATUS: 1 - Symptomatic but completely ambulatory  Vitals:   03/20/21 0919  BP: 136/75  Pulse: 67  Resp: 16  Temp: 97.7 F (36.5 C)  SpO2: 97%    Physical Exam Constitutional:      General: She is not in acute distress.    Appearance: Normal appearance. She is not toxic-appearing.  HENT:     Head: Normocephalic and atraumatic.  Eyes:     General: No scleral icterus. Cardiovascular:     Rate and Rhythm: Normal rate and regular rhythm.     Pulses: Normal pulses.      Heart sounds: Normal heart sounds.  Pulmonary:     Effort: Pulmonary effort is normal.     Breath sounds: Normal breath sounds.  Abdominal:     General: Abdomen is flat. Bowel sounds are normal. There is no distension.     Palpations: Abdomen is soft.     Tenderness: There is no abdominal tenderness.  Musculoskeletal:        General: No swelling.     Cervical back: Neck supple.  Lymphadenopathy:     Cervical: No cervical adenopathy.  Skin:    General: Skin is warm and dry.     Findings: No rash.  Neurological:     General: No focal deficit present.     Mental Status: She is alert.  Psychiatric:        Mood and Affect: Mood normal.        Behavior: Behavior normal.    LABORATORY DATA:  CBC    Component Value Date/Time   WBC 5.3 03/20/2021 0859   WBC 7.9 07/31/2020 1315   RBC 3.91 03/20/2021 0859   HGB 11.0 (L) 03/20/2021 0859   HCT 33.8 (L) 03/20/2021 0859   PLT 236 03/20/2021 0859   MCV 86.4 03/20/2021 0859   MCH 28.1 03/20/2021 0859   MCHC 32.5 03/20/2021 0859   RDW 14.2 03/20/2021 0859   LYMPHSABS 1.2 03/20/2021 0859   MONOABS 0.4 03/20/2021 0859   EOSABS 0.1 03/20/2021 0859   BASOSABS 0.0 03/20/2021 0859    CMP     Component Value Date/Time   NA 141 03/20/2021 0859   K 4.1 03/20/2021 0859   CL 107 03/20/2021 0859   CO2 24 03/20/2021 0859   GLUCOSE 103 (H) 03/20/2021 0859   BUN 20 03/20/2021 0859   CREATININE 0.78 03/20/2021 0859   CALCIUM 8.6 (L) 03/20/2021 0859   PROT 7.1 03/20/2021 0859   ALBUMIN 3.8 03/20/2021 0859   AST 22 03/20/2021 0859   ALT 24 03/20/2021 0859   ALKPHOS 115 03/20/2021 0859   BILITOT 0.4 03/20/2021 0859   GFRNONAA >60 03/20/2021 0859        ASSESSMENT and THERAPY PLAN:   Malignant neoplasm of upper-outer quadrant of right breast in female, estrogen receptor positive ([place]) 07/08/2020: Screening mammogram showed right breast calcifications. Diagnostic mammogram showed right breast calcifications spanning 4.1cm and no  abnormal right axillary lymph nodes. 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 Genetics done in 2019: Negative   Treatment plan: 1. Neoadjuvant chemotherapy with TCH Perjeta 6 cycles completed 11/15/2020 followed by Herceptin Perjeta maintenance for 1 year 2. 12/11/2020:Right breast lumpectomy [name]): Scattered foci of high-grade DCIS with calcifications, no invasive cancer, pathologic  complete response, margins negative, 0/3 lymph nodes negative, previously ER 80%, PR 40%, HER2 positive, Ki-67 25% 3. Followed by adjuvant radiation therapy  started 02/04/2021 4.  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 URCC nausea study --------------------------------------------------------------------------------------------------------------------------------------- Current treatment: Herceptin and Perjeta maintenance. Echo December 27, 2020 shows an EF of 60 to 65% Echo scheduled for March 24, 2021.  Toxicities:  #1.  Diarrhea.  She is managing with hydration and electrolyte supplementation on days that she has heavy diarrhea.  She does not want to forego the Perjeta today.  2.  Fatigue.  She does have a mild anemia which is contributing to this.  Her hemoglobin has improved from being in the 9s to being 11 today.  She notes that she has been able to do more activities and not get as tired or winded.   3.  She does have right axilla pulling. I recommended that she continue her range of motion exercises and massage as she has been.  If this worsens she will let me know and I will place a referral to physical therapy.  We talked about the fact that she is finishing up her radiation next week.  She has had a hysterectomy and therefore does not have menstrual final, however she has not undergone oopherectomy.  At her next visit we will get  an [facility] LH and estradiol to evaluate her menopausal status, as this can guide oral antiestrogen therapy decision making which will occur on January 19 when she follows up with Dr. Sonny Dandy.  Return to clinic every 3 weeks for Herceptin Perjeta and every 6 weeks to follow-up with me   Orders Placed This Encounter  Procedures   FSH-Follicle stimulating hormone    Standing Status:   Standing    Number of Occurrences:   1    Standing Expiration Date:   03/20/2022   LH-Luteinizing hormone    Standing Status:   Standing    Number of Occurrences:   1    Standing Expiration Date:   03/20/2022   Estradiol, Sensitive    Standing Status:   Standing    Number of Occurrences:   1    Standing Expiration Date:   03/20/2022    All questions were answered. The patient knows to call the clinic with any problems, questions or concerns. We can certainly see the patient much sooner if necessary.  Total encounter time: 30 minutes in face to face visit time, chart review, lab review, order entry, care coordination and documentation of the encounter.    Wilber Bihari, NP 03/20/21 10:13 AM Medical Oncology and Hematology [facility] [place], [place] [address] Tel. [phone]    Fax. [phone]  *Total Encounter Time as defined by the Centers for Medicare and Medicaid Services includes, in addition to the face-to-face time of a patient visit (documented in the note above) non-face-to-face time: obtaining and reviewing outside history, ordering and reviewing medications, tests or procedures, care coordination (communications with other health care professionals or caregivers) and documentation in the medical record.

## 2021-03-19 NOTE — Assessment & Plan Note (Addendum)
07/08/2020:Screening mammogram showed right breast calcifications. Diagnostic mammogram showed right breast calcifications spanning 4.1cm and no abnormal right axillary lymph nodes. 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 Genetics done in 2019: Negative  Treatment plan: 1. Neoadjuvant chemotherapy with TCH Perjeta 6 cycles completed 11/15/2020 followed by Herceptin Perjeta maintenance for 1 year 2. 12/11/2020:Right breast lumpectomy Sandy Goodwin): Scattered foci of high-grade DCIS with calcifications, no invasive cancer, pathologic complete response, margins negative, 0/3 lymph nodes negative, previously ER 80%, PR 40%, HER2 positive, Ki-67 25% 3. Followed by adjuvant radiation therapy  started 02/04/2021 4.  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 URCCnausea study --------------------------------------------------------------------------------------------------------------------------------------- Current treatment: Herceptin and Perjeta maintenance. Echo December 27, 2020 shows an EF of 60 to 65% Echo scheduled for March 24, 2021.  Toxicities:  #1.  Diarrhea.  She is managing with hydration and electrolyte supplementation on days that she has heavy diarrhea.  She does not want to forego the Perjeta today.  2.  Fatigue.  She does have a mild anemia which is contributing to this.  Her hemoglobin has improved from being in the 9s to being 11 today.  She notes that she has been able to do more activities and not get as tired or winded.   3.  She does have right axilla pulling. I recommended that she continue her range of motion exercises and massage as she has been.  If this worsens she will let me know and I will place a referral to physical therapy.  We talked about the fact that she is finishing up her radiation next week.   She has had a hysterectomy and therefore does not have menstrual final, however she has not undergone oopherectomy.  At her next visit we will get an [facility] LH and estradiol to evaluate her menopausal status, as this can guide oral antiestrogen therapy decision making which will occur on January 19 when she follows up with Dr. Sonny Dandy.  Return to clinic every 3 weeks for Herceptin Perjeta and every 6 weeks to follow-up with me

## 2021-03-20 ENCOUNTER — Other Ambulatory Visit: Payer: Self-pay

## 2021-03-20 ENCOUNTER — Inpatient Hospital Stay: Payer: BC Managed Care – PPO

## 2021-03-20 ENCOUNTER — Inpatient Hospital Stay (HOSPITAL_BASED_OUTPATIENT_CLINIC_OR_DEPARTMENT_OTHER): Payer: BC Managed Care – PPO | Admitting: Adult Health

## 2021-03-20 ENCOUNTER — Ambulatory Visit: Payer: BC Managed Care – PPO

## 2021-03-20 ENCOUNTER — Encounter: Payer: Self-pay | Admitting: Adult Health

## 2021-03-20 ENCOUNTER — Inpatient Hospital Stay: Payer: BC Managed Care – PPO | Attending: Hematology and Oncology

## 2021-03-20 ENCOUNTER — Ambulatory Visit
Admission: RE | Admit: 2021-03-20 | Discharge: 2021-03-20 | Disposition: A | Discharge status: Home / Self Care | Payer: BC Managed Care – PPO | Source: Ambulatory Visit | Attending: Radiation Oncology | Admitting: Radiation Oncology

## 2021-03-20 ENCOUNTER — Encounter: Payer: Self-pay | Admitting: *Deleted

## 2021-03-20 VITALS — BP 136/75 | HR 67 | Temp 97.7°F | Resp 16 | Ht 66.0 in | Wt 202.1 lb

## 2021-03-20 DIAGNOSIS — Z5112 Encounter for antineoplastic immunotherapy: Secondary | ICD-10-CM | POA: Diagnosis not present

## 2021-03-20 DIAGNOSIS — M2550 Pain in unspecified joint: Secondary | ICD-10-CM | POA: Insufficient documentation

## 2021-03-20 DIAGNOSIS — C50411 Malignant neoplasm of upper-outer quadrant of right female breast: Secondary | ICD-10-CM | POA: Diagnosis present

## 2021-03-20 DIAGNOSIS — Z170 Estrogen receptor positive status [ER+]: Secondary | ICD-10-CM

## 2021-03-20 DIAGNOSIS — Z95828 Presence of other vascular implants and grafts: Secondary | ICD-10-CM

## 2021-03-20 DIAGNOSIS — N814 Uterovaginal prolapse, unspecified: Secondary | ICD-10-CM | POA: Insufficient documentation

## 2021-03-20 LAB — CBC WITH DIFFERENTIAL (CANCER CENTER ONLY)
Abs Immature Granulocytes: 0.02 10*3/uL (ref 0.00–0.07)
Basophils Absolute: 0 10*3/uL (ref 0.0–0.1)
Basophils Relative: 0 %
Eosinophils Absolute: 0.1 10*3/uL (ref 0.0–0.5)
Eosinophils Relative: 2 %
HCT: 33.8 % — ABNORMAL LOW (ref 36.0–46.0)
Hemoglobin: 11 g/dL — ABNORMAL LOW (ref 12.0–15.0)
Immature Granulocytes: 0 %
Lymphocytes Relative: 22 %
Lymphs Abs: 1.2 10*3/uL (ref 0.7–4.0)
MCH: 28.1 pg (ref 26.0–34.0)
MCHC: 32.5 g/dL (ref 30.0–36.0)
MCV: 86.4 fL (ref 80.0–100.0)
Monocytes Absolute: 0.4 10*3/uL (ref 0.1–1.0)
Monocytes Relative: 8 %
Neutro Abs: 3.6 10*3/uL (ref 1.7–7.7)
Neutrophils Relative %: 68 %
Platelet Count: 236 10*3/uL (ref 150–400)
RBC: 3.91 MIL/uL (ref 3.87–5.11)
RDW: 14.2 % (ref 11.5–15.5)
WBC Count: 5.3 10*3/uL (ref 4.0–10.5)
nRBC: 0 % (ref 0.0–0.2)

## 2021-03-20 LAB — CMP (CANCER CENTER ONLY)
ALT: 24 U/L (ref 0–44)
AST: 22 U/L (ref 15–41)
Albumin: 3.8 g/dL (ref 3.5–5.0)
Alkaline Phosphatase: 115 U/L (ref 38–126)
Anion gap: 10 (ref 5–15)
BUN: 20 mg/dL (ref 6–20)
CO2: 24 mmol/L (ref 22–32)
Calcium: 8.6 mg/dL — ABNORMAL LOW (ref 8.9–10.3)
Chloride: 107 mmol/L (ref 98–111)
Creatinine: 0.78 mg/dL (ref 0.44–1.00)
GFR, Estimated: >60 mL/min (ref >60)
Glucose, Bld: 103 mg/dL — ABNORMAL HIGH (ref 70–99)
Potassium: 4.1 mmol/L (ref 3.5–5.1)
Sodium: 141 mmol/L (ref 135–145)
Total Bilirubin: 0.4 mg/dL (ref 0.3–1.2)
Total Protein: 7.1 g/dL (ref 6.5–8.1)

## 2021-03-20 MED ORDER — SODIUM CHLORIDE 0.9 % IV SOLN: Freq: Once | INTRAVENOUS | Status: AC

## 2021-03-20 MED ORDER — DIPHENHYDRAMINE HCL 25 MG PO CAPS
25.0000 mg | ORAL_CAPSULE | Freq: Once | ORAL | Status: AC
  Administered 2021-03-20: 25 mg via ORAL
  Filled 2021-03-20: qty 1

## 2021-03-20 MED ORDER — SODIUM CHLORIDE 0.9% FLUSH
10.0000 mL | Freq: Once | INTRAVENOUS | Status: AC
  Administered 2021-03-20: 10 mL

## 2021-03-20 MED ORDER — SODIUM CHLORIDE 0.9 % IV SOLN
420.0000 mg | Freq: Once | INTRAVENOUS | Status: AC
  Administered 2021-03-20: 420 mg via INTRAVENOUS
  Filled 2021-03-20: qty 14

## 2021-03-20 MED ORDER — ACETAMINOPHEN 325 MG PO TABS
650.0000 mg | ORAL_TABLET | Freq: Once | ORAL | Status: AC
  Administered 2021-03-20: 650 mg via ORAL
  Filled 2021-03-20: qty 2

## 2021-03-20 MED ORDER — TRASTUZUMAB-DKST CHEMO 150 MG IV SOLR
6.0000 mg/kg | Freq: Once | INTRAVENOUS | Status: AC
  Administered 2021-03-20: 546 mg via INTRAVENOUS
  Filled 2021-03-20: qty 26

## 2021-03-20 MED ORDER — SODIUM CHLORIDE 0.9% FLUSH
10.0000 mL | INTRAVENOUS | Status: DC | PRN
  Administered 2021-03-20: 10 mL

## 2021-03-20 MED ORDER — HEPARIN SOD (PORK) LOCK FLUSH 100 UNIT/ML IV SOLN
500.0000 [IU] | Freq: Once | INTRAVENOUS | Status: AC | PRN
  Administered 2021-03-20: 500 [IU]

## 2021-03-20 NOTE — Patient Instructions (Signed)
[facility] ONCOLOGY  Discharge Instructions: Thank you for choosing [facility] to provide your oncology and hematology care.   If you have a lab appointment with the [facility], please go directly to the [facility] and check in at the registration area.   Wear comfortable clothing and clothing appropriate for easy access to any Portacath or PICC line.   We strive to give you quality time with your provider. You may need to reschedule your appointment if you arrive late (15 or more minutes).  Arriving late affects you and other patients whose appointments are after yours.  Also, if you miss three or more appointments without notifying the office, you may be dismissed from the clinic at the provider's discretion.      For prescription refill requests, have your pharmacy contact our office and allow 72 hours for refills to be completed.    Today you received the following chemotherapy and/or immunotherapy agents Trastuzumab and Perjeta      To help prevent nausea and vomiting after your treatment, we encourage you to take your nausea medication as directed.  BELOW ARE SYMPTOMS THAT SHOULD BE REPORTED IMMEDIATELY: *FEVER GREATER THAN 100.4 F (38 C) OR HIGHER *CHILLS OR SWEATING *NAUSEA AND VOMITING THAT IS NOT CONTROLLED WITH YOUR NAUSEA MEDICATION *UNUSUAL SHORTNESS OF BREATH *UNUSUAL BRUISING OR BLEEDING *URINARY PROBLEMS (pain or burning when urinating, or frequent urination) *BOWEL PROBLEMS (unusual diarrhea, constipation, pain near the anus) TENDERNESS IN MOUTH AND THROAT WITH OR WITHOUT PRESENCE OF ULCERS (sore throat, sores in mouth, or a toothache) UNUSUAL RASH, SWELLING OR PAIN  UNUSUAL VAGINAL DISCHARGE OR ITCHING   Items with * indicate a potential emergency and should be followed up as soon as possible or go to the Emergency Department if any problems should occur.  Please show the CHEMOTHERAPY ALERT CARD or IMMUNOTHERAPY ALERT CARD at  check-in to the Emergency Department and triage nurse.  Should you have questions after your visit or need to cancel or reschedule your appointment, please contact [facility]  Dept: [phone]  and follow the prompts.  Office hours are 8:00 a.m. to 4:30 p.m. Monday - Friday. Please note that voicemails left after 4:00 p.m. may not be returned until the following business day.  We are closed weekends and major holidays. You have access to a nurse at all times for urgent questions. Please call the main number to the clinic Dept: [phone] and follow the prompts.   For any non-urgent questions, you may also contact your provider using MyChart. We now offer e-Visits for anyone 83 and older to request care online for non-urgent symptoms. For details visit [email or web address].   Also download the MyChart app! Go to the app store, search "MyChart", open the app, select [facility], and log in with your MyChart username and password.  Due to Covid, a mask is required upon entering the hospital/clinic. If you do not have a mask, one will be given to you upon arrival. For doctor visits, patients may have 1 support person aged 41 or older with them. For treatment visits, patients cannot have anyone with them due to current Covid guidelines and our immunocompromised population.

## 2021-03-21 ENCOUNTER — Other Ambulatory Visit: Payer: Self-pay

## 2021-03-21 ENCOUNTER — Ambulatory Visit
Admission: RE | Admit: 2021-03-21 | Discharge: 2021-03-21 | Disposition: A | Discharge status: Home / Self Care | Payer: BC Managed Care – PPO | Source: Ambulatory Visit | Attending: Radiation Oncology | Admitting: Radiation Oncology

## 2021-03-21 ENCOUNTER — Ambulatory Visit: Payer: BC Managed Care – PPO

## 2021-03-21 DIAGNOSIS — Z5112 Encounter for antineoplastic immunotherapy: Secondary | ICD-10-CM | POA: Diagnosis not present

## 2021-03-24 ENCOUNTER — Ambulatory Visit: Payer: BC Managed Care – PPO

## 2021-03-24 ENCOUNTER — Other Ambulatory Visit: Payer: Self-pay

## 2021-03-24 ENCOUNTER — Ambulatory Visit
Admission: RE | Admit: 2021-03-24 | Discharge: 2021-03-24 | Disposition: A | Discharge status: Home / Self Care | Payer: BC Managed Care – PPO | Source: Ambulatory Visit | Attending: Radiation Oncology | Admitting: Radiation Oncology

## 2021-03-24 ENCOUNTER — Ambulatory Visit (HOSPITAL_COMMUNITY)
Admission: RE | Admit: 2021-03-24 | Discharge: 2021-03-24 | Disposition: A | Discharge status: Home / Self Care | Payer: BC Managed Care – PPO | Source: Ambulatory Visit | Attending: Hematology and Oncology | Admitting: Hematology and Oncology

## 2021-03-24 DIAGNOSIS — C50411 Malignant neoplasm of upper-outer quadrant of right female breast: Secondary | ICD-10-CM | POA: Insufficient documentation

## 2021-03-24 DIAGNOSIS — Z0189 Encounter for other specified special examinations: Secondary | ICD-10-CM

## 2021-03-24 DIAGNOSIS — Z170 Estrogen receptor positive status [ER+]: Secondary | ICD-10-CM | POA: Insufficient documentation

## 2021-03-24 LAB — ECHOCARDIOGRAM COMPLETE
Area-P 1/2: 3.68 cm2
S' Lateral: 3.5 cm

## 2021-03-24 NOTE — Progress Notes (Signed)
*  Echocardiogram 2D Echocardiogram has been performed.  Darlina Sicilian M 03/24/2021, 10:24 AM

## 2021-03-25 ENCOUNTER — Ambulatory Visit
Admission: RE | Admit: 2021-03-25 | Discharge: 2021-03-25 | Disposition: A | Discharge status: Home / Self Care | Payer: BC Managed Care – PPO | Source: Ambulatory Visit | Attending: Radiation Oncology | Admitting: Radiation Oncology

## 2021-03-25 ENCOUNTER — Encounter: Payer: Self-pay | Admitting: Radiation Oncology

## 2021-03-25 ENCOUNTER — Ambulatory Visit: Payer: BC Managed Care – PPO

## 2021-03-25 DIAGNOSIS — Z5112 Encounter for antineoplastic immunotherapy: Secondary | ICD-10-CM | POA: Diagnosis not present

## 2021-03-26 ENCOUNTER — Ambulatory Visit: Payer: BC Managed Care – PPO

## 2021-03-27 ENCOUNTER — Ambulatory Visit: Payer: BC Managed Care – PPO

## 2021-03-28 ENCOUNTER — Ambulatory Visit: Payer: BC Managed Care – PPO

## 2021-03-31 ENCOUNTER — Encounter: Payer: Self-pay | Admitting: Hematology and Oncology

## 2021-03-31 NOTE — Progress Notes (Signed)
° °                                                                                                                                                          °  Patient Name: Sandy Goodwin MRN: [id number] DOB: 12-26-1970 Referring Physician: Gar Ponto (Profile Not Attached) Date of Service: 03/25/2021 [facility] [place]                                                        End Of Treatment Note  Diagnoses: C50.411-Malignant neoplasm of upper-outer quadrant of right female breast  Cancer Staging:  Stage IB, cT2N0M0 grade 3, triple positive invasive ductal carcinoma of the right breast with complete response of invasive disease following neoadjuvant chemotherapy  Intent: Curative  Radiation Treatment Dates: 02/03/2021 through 03/25/2021 Site Technique Total Dose (Gy) Dose per Fx (Gy) Completed Fx Beam Energies  Breast, Right: Breast_Rt 3D 50.4/50.4 1.8 28/28 6XFFF  Breast, Right: Breast_Rt_Bst 3D 10/10 2 5/5 6X   Narrative: The patient tolerated radiation therapy relatively well. She developed fatigue and anticipated skin changes in the treatment field.   Plan: The patient will receive a call in about one month from the radiation oncology department. She will continue follow up with Dr. Lindi Adie as well.   ________________________________________________    Carola Rhine, [facility]

## 2021-04-08 ENCOUNTER — Other Ambulatory Visit: Payer: Self-pay

## 2021-04-08 ENCOUNTER — Ambulatory Visit: Payer: BC Managed Care – PPO | Attending: General Surgery

## 2021-04-08 VITALS — Wt 206.2 lb

## 2021-04-08 DIAGNOSIS — Z483 Aftercare following surgery for neoplasm: Secondary | ICD-10-CM | POA: Insufficient documentation

## 2021-04-08 NOTE — Therapy (Signed)
[facility] @ [facility] [place], [place], [address] Phone: [phone]   Fax:  [phone]  Physical Therapy Treatment  Patient Details  Name: Sandy Goodwin MRN: [id number] Date of Birth: Jan 21, 1971 Referring Provider (PT): Dr. Rolm Bookbinder   Encounter Date: 04/08/2021   PT End of Session - 04/08/21 6387     Visit Number 2   # unchanged due to screen only   PT Start Time 0817    PT Stop Time 0823    PT Time Calculation (min) 6 min    Activity Tolerance Patient tolerated treatment well    Behavior During Therapy [facility] for tasks assessed/performed             Past Medical History:  Diagnosis Date   Arthritis    Breast cancer ([place])    Complication of anesthesia    Itching   Dysrhythmia    Tachycardia   Hypothyroidism     Past Surgical History:  Procedure Laterality Date   ABDOMINAL HYSTERECTOMY     APPENDECTOMY  2008   Bowel Rupture Repair  2008   Ruptured during appendectomy   Breast Biopsy Right 1999   BREAST LUMPECTOMY WITH RADIOACTIVE SEED AND SENTINEL LYMPH NODE BIOPSY Right 12/11/2020   Procedure: RIGHT BREAST LUMPECTOMY WITH RADIOACTIVE SEED X2 AND RIGHT AXILLARY SENTINEL LYMPH NODE BIOPSY;  Surgeon: Rolm Bookbinder, MD;  Location: [facility];  Service: General;  Laterality: Right;   BREAST RECONSTRUCTION Right 12/20/2020   Procedure: RIGHT ONCOPLASTIC RECONSTRUCTION;  Surgeon: Irene Limbo, MD;  Location: [facility];  Service: Plastics;  Laterality: Right;   BREAST REDUCTION SURGERY Left 12/20/2020   Procedure: LEFT BREAST REDUCTION;  Surgeon: Irene Limbo, MD;  Location: [facility];  Service: Plastics;  Laterality: Left;   COLONOSCOPY N/A 06/15/2016   Procedure: COLONOSCOPY;  Surgeon: Rogene Houston, MD;  [facility];  Service: Endoscopy;  Laterality: N/A;  845   IBF  [id number]   INCISION AND DRAINAGE OF WOUND Right 01/10/2021    Procedure: INCISION AND DRAINAGE OF RIGHT AXILLA;  Surgeon: Irene Limbo, MD;  Location: [facility];  Service: Plastics;  Laterality: Right;   PORTACATH PLACEMENT Left 07/23/2020   Procedure: INSERTION PORT-A-CATH;  Surgeon: Rolm Bookbinder, MD;  Location: [facility];  Service: General;  Laterality: Left;   THYROIDECTOMY, PARTIAL Bilateral 2012    Vitals:   04/08/21 0819  Weight: 206 lb 4 oz (93.6 kg)     Subjective Assessment - 04/08/21 0819     Subjective Pt returns for her 3 month L-Dex screen.    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 There are 4.1 cm of calcifications.with a Ki67 of 25%; pt complete chemo in Aug 2022, 12/11/20 - R breast lumpectomy and SLNB (0/3), 12/20/20- oncoplasty on R and breast reduction on the L, 9/30 pt had to have another surgery to drain the R axilla which had an absess, pt will undergo radiation soon                    L-DEX FLOWSHEETS - 04/08/21 0800       L-DEX LYMPHEDEMA SCREENING   Measurement Type Unilateral    L-DEX MEASUREMENT EXTREMITY Upper Extremity    POSITION  Standing    DOMINANT SIDE Right    At Risk Side Right    BASELINE SCORE (UNILATERAL) -3  L-DEX SCORE (UNILATERAL) 0.1    VALUE CHANGE (UNILAT) 3.1                                     PT Long Term Goals - 01/22/21 1042       PT LONG TERM GOAL #1   Title Patient will demonstrate she has regained full shoulder ROM and function post operatively compared to baselines.    Time 6    Period Months    Status Achieved                   Plan - 04/08/21 7341     Clinical Impression Statement Pt returns for her 3 month L-Dex screen. Her change from baseline of 3.1 is WNLs so no further treatment is required at this time except to cont every 3 month L-Dex screens which pt is agreeable to.    PT Next Visit Plan  continue 3 month L-dex screenings for 2 years from her SLNB (~12/21/2022)    Consulted and Agree with Plan of Care Patient             Patient will benefit from skilled therapeutic intervention in order to improve the following deficits and impairments:     Visit Diagnosis: Aftercare following surgery for neoplasm     Problem List Patient Active Problem List   Diagnosis Date Noted   Uterine prolapse 03/20/2021   Polyarthralgia 03/20/2021   Port-A-Cath in place 02/06/2021   Genetic testing 07/17/2020   Malignant neoplasm of upper-outer quadrant of right breast in female, estrogen receptor positive ([place]) 07/12/2020   Change in bowel habits 06/12/2016   Mixed hyperlipidemia 01/07/2016   Hypothyroidism, unspecified 01/07/2016   Allergic rhinitis 04/02/2015   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

## 2021-04-10 ENCOUNTER — Other Ambulatory Visit: Payer: Self-pay | Admitting: Adult Health

## 2021-04-10 ENCOUNTER — Inpatient Hospital Stay: Payer: BC Managed Care – PPO

## 2021-04-10 ENCOUNTER — Other Ambulatory Visit: Payer: Self-pay

## 2021-04-10 VITALS — BP 99/66 | HR 62 | Temp 98.3°F | Resp 18

## 2021-04-10 DIAGNOSIS — C50411 Malignant neoplasm of upper-outer quadrant of right female breast: Secondary | ICD-10-CM

## 2021-04-10 DIAGNOSIS — Z5112 Encounter for antineoplastic immunotherapy: Secondary | ICD-10-CM | POA: Diagnosis not present

## 2021-04-10 DIAGNOSIS — Z95828 Presence of other vascular implants and grafts: Secondary | ICD-10-CM

## 2021-04-10 LAB — CBC WITH DIFFERENTIAL (CANCER CENTER ONLY)
Abs Immature Granulocytes: 0.01 10*3/uL (ref 0.00–0.07)
Basophils Absolute: 0 10*3/uL (ref 0.0–0.1)
Basophils Relative: 1 %
Eosinophils Absolute: 0.2 10*3/uL (ref 0.0–0.5)
Eosinophils Relative: 4 %
HCT: 34 % — ABNORMAL LOW (ref 36.0–46.0)
Hemoglobin: 10.9 g/dL — ABNORMAL LOW (ref 12.0–15.0)
Immature Granulocytes: 0 %
Lymphocytes Relative: 33 %
Lymphs Abs: 1.5 10*3/uL (ref 0.7–4.0)
MCH: 27 pg (ref 26.0–34.0)
MCHC: 32.1 g/dL (ref 30.0–36.0)
MCV: 84.2 fL (ref 80.0–100.0)
Monocytes Absolute: 0.4 10*3/uL (ref 0.1–1.0)
Monocytes Relative: 9 %
Neutro Abs: 2.5 10*3/uL (ref 1.7–7.7)
Neutrophils Relative %: 53 %
Platelet Count: 258 10*3/uL (ref 150–400)
RBC: 4.04 MIL/uL (ref 3.87–5.11)
RDW: 14.4 % (ref 11.5–15.5)
WBC Count: 4.6 10*3/uL (ref 4.0–10.5)
nRBC: 0 % (ref 0.0–0.2)

## 2021-04-10 LAB — CMP (CANCER CENTER ONLY)
ALT: 23 U/L (ref 0–44)
AST: 19 U/L (ref 15–41)
Albumin: 3.9 g/dL (ref 3.5–5.0)
Alkaline Phosphatase: 99 U/L (ref 38–126)
Anion gap: 5 (ref 5–15)
BUN: 21 mg/dL — ABNORMAL HIGH (ref 6–20)
CO2: 29 mmol/L (ref 22–32)
Calcium: 8.7 mg/dL — ABNORMAL LOW (ref 8.9–10.3)
Chloride: 105 mmol/L (ref 98–111)
Creatinine: 0.79 mg/dL (ref 0.44–1.00)
GFR, Estimated: >60 mL/min (ref >60)
Glucose, Bld: 103 mg/dL — ABNORMAL HIGH (ref 70–99)
Potassium: 4.1 mmol/L (ref 3.5–5.1)
Sodium: 139 mmol/L (ref 135–145)
Total Bilirubin: 0.4 mg/dL (ref 0.3–1.2)
Total Protein: 6.9 g/dL (ref 6.5–8.1)

## 2021-04-10 MED ORDER — SODIUM CHLORIDE 0.9% FLUSH
10.0000 mL | Freq: Once | INTRAVENOUS | Status: AC
  Administered 2021-04-10: 08:00:00 10 mL

## 2021-04-10 MED ORDER — TRASTUZUMAB-DKST CHEMO 150 MG IV SOLR
6.0000 mg/kg | Freq: Once | INTRAVENOUS | Status: AC
  Administered 2021-04-10: 10:00:00 546 mg via INTRAVENOUS
  Filled 2021-04-10: qty 26

## 2021-04-10 MED ORDER — ACETAMINOPHEN 325 MG PO TABS
650.0000 mg | ORAL_TABLET | Freq: Once | ORAL | Status: AC
  Administered 2021-04-10: 09:00:00 650 mg via ORAL
  Filled 2021-04-10: qty 2

## 2021-04-10 MED ORDER — HEPARIN SOD (PORK) LOCK FLUSH 100 UNIT/ML IV SOLN
500.0000 [IU] | Freq: Once | INTRAVENOUS | Status: AC | PRN
  Administered 2021-04-10: 12:00:00 500 [IU]

## 2021-04-10 MED ORDER — SODIUM CHLORIDE 0.9 % IV SOLN: INTRAVENOUS | Status: DC

## 2021-04-10 MED ORDER — DIPHENHYDRAMINE HCL 25 MG PO CAPS
25.0000 mg | ORAL_CAPSULE | Freq: Once | ORAL | Status: AC
  Administered 2021-04-10: 09:00:00 25 mg via ORAL
  Filled 2021-04-10: qty 1

## 2021-04-10 MED ORDER — SODIUM CHLORIDE 0.9 % IV SOLN
420.0000 mg | Freq: Once | INTRAVENOUS | Status: AC
  Administered 2021-04-10: 11:00:00 420 mg via INTRAVENOUS
  Filled 2021-04-10: qty 14

## 2021-04-10 MED ORDER — SODIUM CHLORIDE 0.9% FLUSH
10.0000 mL | INTRAVENOUS | Status: DC | PRN
  Administered 2021-04-10: 12:00:00 10 mL

## 2021-04-11 LAB — LUTEINIZING HORMONE: LH: 28.3 m[IU]/mL

## 2021-04-11 LAB — FOLLICLE STIMULATING HORMONE: FSH: 48.5 m[IU]/mL

## 2021-04-17 LAB — ESTRADIOL, ULTRA SENS: Estradiol, Sensitive: <2.5 pg/mL

## 2021-04-21 ENCOUNTER — Ambulatory Visit
Admission: RE | Admit: 2021-04-21 | Discharge: 2021-04-21 | Disposition: A | Discharge status: Home / Self Care | Payer: BC Managed Care – PPO | Source: Ambulatory Visit | Attending: Radiation Oncology | Admitting: Radiation Oncology

## 2021-04-21 DIAGNOSIS — C50411 Malignant neoplasm of upper-outer quadrant of right female breast: Secondary | ICD-10-CM | POA: Insufficient documentation

## 2021-04-21 DIAGNOSIS — Z170 Estrogen receptor positive status [ER+]: Secondary | ICD-10-CM | POA: Insufficient documentation

## 2021-04-24 NOTE — Progress Notes (Signed)
°  Radiation Oncology         [phone] ________________________________  Name: Sandy Goodwin MRN: [id number]  Date of Service: 04/24/21 DOB: Aug 12, 1970  Post Treatment Telephone Note  Diagnosis:   Stage IB, cT2N0M0 grade 3, triple positive invasive ductal carcinoma of the right breast with complete response of invasive disease following neoadjuvant chemotherapy  Intent: Curative  Radiation Treatment Dates: 02/03/2021 through 03/25/2021 Site Technique Total Dose (Gy) Dose per Fx (Gy) Completed Fx Beam Energies  Breast, Right: Breast_Rt 3D 50.4/50.4 1.8 28/28 6XFFF  Breast, Right: Breast_Rt_Bst 3D 10/10 2 5/5 6X   Narrative: The patient tolerated radiation therapy relatively well. She developed fatigue and anticipated skin changes in the treatment field.    Impression/Plan: 1. Stage IB, cT2N0M0 grade 3, triple positive invasive ductal carcinoma of the right breast with complete response of invasive disease following neoadjuvant chemotherapy. The patient has been doing well since completion of radiotherapy. We discussed that we would be happy to continue to follow her as needed, but she will also continue to follow up with Dr. Lindi Adie in medical oncology. She was counseled on skin care as well as measures to avoid sun exposure to this area.       Carola Rhine, PAC

## 2021-04-30 NOTE — Assessment & Plan Note (Signed)
07/08/2020:Screening mammogram showed right breast calcifications. Diagnostic mammogram showed right breast calcifications spanning 4.1cm and no abnormal right axillary lymph nodes. 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 Genetics done in 2019: Negative  Treatment plan: 1. Neoadjuvant chemotherapy with [place] Perjeta 6 cyclescompleted 8/5/2022followed by Herceptin Perjeta maintenance for 1 year 2.12/11/2020:Right breast lumpectomy [name]): Scattered foci of high-grade DCIS with calcifications, no invasive cancer, pathologic complete response, margins negative, 0/3 lymph nodes negative, previously ER 80%, PR 40%, HER2 positive, Ki-67 25% 3. 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 URCCnausea study --------------------------------------------------------------------------------------------------------------------------------------- Current treatment: Herceptin and Perjeta maintenance. Toxicities: Tolerating it well without any problems. Echocardiogram 12/27/2020: EF 60 to 65% Fatigue: Being monitored closely Denies any diarrhea .  Return to clinic every 3 weeks for Herceptin Perjeta and every 6 weeks to follow-up with me

## 2021-04-30 NOTE — Progress Notes (Signed)
Patient Care Team: Sandy Bis, MD as PCP - General (Family Medicine) Sandy Kaufmann, RN as Oncology Nurse Navigator Sandy Germany, RN as Oncology Nurse Navigator Sandy Bookbinder, MD as Consulting Physician (General Surgery) Sandy Lose, MD as Consulting Physician (Hematology and Oncology) Sandy Rudd, MD as Consulting Physician (Radiation Oncology)  DIAGNOSIS:    ICD-10-CM   1. Malignant neoplasm of upper-outer quadrant of right breast in female, estrogen receptor positive ([place])  C50.411    Z17.0       SUMMARY OF ONCOLOGIC HISTORY: Oncology History  Malignant neoplasm of upper-outer quadrant of right breast in female, estrogen receptor positive ([place])  07/08/2020 Initial Diagnosis   Screening mammogram showed right breast calcifications. Diagnostic mammogram showed right breast calcifications spanning 4.1cm and no abnormal right axillary lymph nodes. 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   07/24/2020 - 11/15/2020 Neo-Adjuvant Chemotherapy   Taxotere, Carbo, Herceptin, Perjeta given every three weeks x 6   12/05/2020 -  Chemotherapy   Maintenance Herceptin/Perjeta every three weeks for one year       12/11/2020 Surgery   Right breast lumpectomy Sandy Goodwin): Scattered foci of high-grade DCIS with calcifications, no invasive cancer, pathologic complete response, margins negative, 0/3 lymph nodes negative, previously ER 80%, PR 40%, HER2 positive, Ki-67 25%   12/11/2020 Cancer Staging   Staging form: Breast, AJCC 8th Edition - Pathologic stage from 12/11/2020: No Stage Recommended (ypT0, pN0, cM0) - Signed by Sandy Phlegm, NP on 03/19/2021 Stage prefix: Post-therapy    12/20/2020 Surgery   Reconstruction with Dr. Iran Goodwin   02/04/2021 - 03/25/2021 Radiation Therapy   Adjuvant radiation     CHIEF COMPLIANT: Herceptin and Perjeta maintenance  INTERVAL HISTORY: Sandy Goodwin is  a 51 y.o. with above-mentioned history of  breast cancer currently on chemotherapy with Herceptin and Perjeta. She presents to the clinic today for maintenance Herceptin and Perjeta.  She continues to suffer from intermittent diarrhea.  She has also been noticing ocular symptoms with what appears to be squiggly lines in the right eye.  She went to her ophthalmologist and had a complete examination and they did not find any problems.  ALLERGIES:  is allergic to codeine and hydrocodone.  MEDICATIONS:  Current Outpatient Medications  Medication Sig Dispense Refill   BYSTOLIC 5 MG tablet Take 1 tablet by mouth daily.  1   cetirizine (ZYRTEC) 10 MG tablet Take 10 mg by mouth daily.     clobetasol ointment (TEMOVATE) 0.05 % Apply topically 2 (two) times daily.     famotidine (PEPCID) 10 MG tablet Take 10 mg by mouth 2 (two) times daily.     levothyroxine (SYNTHROID, LEVOTHROID) 50 MCG tablet Take 50 mcg by mouth daily before breakfast.     lidocaine-prilocaine (EMLA) cream APPLY TO THE AFFECTED AREA(S) DAILY 30 g 0   LORazepam (ATIVAN) 0.5 MG tablet Take 1 tablet (0.$RemoveBef'5mg'wilKJRVOxr$  tablet) at bedtime, as needed for nausea. 30 tablet 0   meloxicam (MOBIC) 15 MG tablet Take 15 mg by mouth daily.     metroNIDAZOLE (METROCREAM) 0.75 % cream Apply 1 application topically 2 (two) times daily.     triamcinolone (NASACORT) 55 MCG/ACT AERO nasal inhaler Place 2 sprays into the nose daily.     Turmeric 500 MG CAPS Take by mouth.     valACYclovir (VALTREX) 1000 MG tablet Take 1 tablet (1,000 mg total) by mouth 2 (two) times daily. [place]  tablet 2   No current facility-administered medications for this visit.    PHYSICAL EXAMINATION: ECOG PERFORMANCE STATUS: 1 - Symptomatic but completely ambulatory  Vitals:   05/01/21 0917  BP: 131/70  Pulse: 73  Resp: 18  Temp: (!) 97.5 F (36.4 C)  SpO2: 99%   Filed Weights   05/01/21 0917  Weight: 201 lb 14.4 oz (91.6 kg)      LABORATORY DATA:  I have reviewed the data  as listed CMP Latest Ref Rng & Units 04/10/2021 03/20/2021 02/06/2021  Glucose 70 - 99 mg/dL 103(H) 103(H) 159(H)  BUN 6 - 20 mg/dL 21(H) 20 20  Creatinine 0.44 - 1.00 mg/dL 0.79 0.78 0.81  Sodium 135 - 145 mmol/L 139 141 140  Potassium 3.5 - 5.1 mmol/L 4.1 4.1 3.7  Chloride 98 - 111 mmol/L 105 107 107  CO2 22 - 32 mmol/L $RemoveB'29 24 23  'NjjMiACO$ Calcium 8.9 - 10.3 mg/dL 8.7(L) 8.6(L) 8.6(L)  Total Protein 6.5 - 8.1 g/dL 6.9 7.1 6.8  Total Bilirubin 0.3 - 1.2 mg/dL 0.4 0.4 0.3  Alkaline Phos 38 - 126 U/L 99 115 112  AST 15 - 41 U/L $Remo'19 22 21  'jNqjS$ ALT 0 - 44 U/L $Remo'23 24 26    'FepnC$ Lab Results  Component Value Date   WBC 5.1 05/01/2021   HGB 10.9 (L) 05/01/2021   HCT 33.4 (L) 05/01/2021   MCV 84.6 05/01/2021   PLT 219 05/01/2021   NEUTROABS 3.5 05/01/2021    ASSESSMENT & PLAN:  Malignant neoplasm of upper-outer quadrant of right breast in female, estrogen receptor positive ([place]) 07/08/2020: Screening mammogram showed right breast calcifications. Diagnostic mammogram showed right breast calcifications spanning 4.1cm and no abnormal right axillary lymph nodes. 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 Genetics done in 2019: Negative   Treatment plan: 1. Neoadjuvant chemotherapy with [place] Perjeta 6 cycles completed 11/15/2020 followed by Herceptin Perjeta maintenance for 1 year 2. 12/11/2020:Right breast lumpectomy Sandy Goodwin): Scattered foci of high-grade DCIS with calcifications, no invasive cancer, pathologic complete response, margins negative, 0/3 lymph nodes negative, previously ER 80%, PR 40%, HER2 positive, Ki-67 25% 3. Followed by adjuvant radiation therapy  started 02/04/2021 4.  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 URCC nausea  study --------------------------------------------------------------------------------------------------------------------------------------- Current treatment: Herceptin and Perjeta maintenance.  (To be completed 07/24/2021) Toxicities:  Echocardiogram 12/27/2020: EF 60 to 65% next echocardiogram will be done and March Fatigue: Being monitored closely Intermittent diarrhea I findings: Patient has seen squiggly eyes in the right eyelid.  Seeing ophthalmology.  No findings of concern according to her.  Return to clinic every 3 weeks for Herceptin Perjeta and every 6 weeks to follow-up with me.  She finishes her treatment 07/24/2021      No orders of the defined types were placed in this encounter.  The patient has a good understanding of the overall plan. she agrees with it. she will call with any problems that may develop before the next visit here.  Total time spent: 30 mins including face to face time and time spent for planning, charting and coordination of care  Rulon Eisenmenger, MD, MPH 05/01/2021  I, Thana Ates, am acting as scribe for Dr. Nicholas Goodwin.  I have reviewed the above documentation for accuracy and completeness, and I agree with the above.

## 2021-05-01 ENCOUNTER — Other Ambulatory Visit: Payer: Self-pay

## 2021-05-01 ENCOUNTER — Inpatient Hospital Stay: Payer: BC Managed Care – PPO

## 2021-05-01 ENCOUNTER — Inpatient Hospital Stay: Payer: BC Managed Care – PPO | Attending: Hematology and Oncology

## 2021-05-01 ENCOUNTER — Inpatient Hospital Stay (HOSPITAL_BASED_OUTPATIENT_CLINIC_OR_DEPARTMENT_OTHER): Payer: BC Managed Care – PPO | Admitting: Hematology and Oncology

## 2021-05-01 DIAGNOSIS — C50411 Malignant neoplasm of upper-outer quadrant of right female breast: Secondary | ICD-10-CM | POA: Diagnosis not present

## 2021-05-01 DIAGNOSIS — Z5112 Encounter for antineoplastic immunotherapy: Secondary | ICD-10-CM | POA: Insufficient documentation

## 2021-05-01 DIAGNOSIS — Z170 Estrogen receptor positive status [ER+]: Secondary | ICD-10-CM | POA: Diagnosis not present

## 2021-05-01 DIAGNOSIS — Z95828 Presence of other vascular implants and grafts: Secondary | ICD-10-CM

## 2021-05-01 LAB — CBC WITH DIFFERENTIAL (CANCER CENTER ONLY)
Abs Immature Granulocytes: 0.01 10*3/uL (ref 0.00–0.07)
Basophils Absolute: 0 10*3/uL (ref 0.0–0.1)
Basophils Relative: 0 %
Eosinophils Absolute: 0.1 10*3/uL (ref 0.0–0.5)
Eosinophils Relative: 2 %
HCT: 33.4 % — ABNORMAL LOW (ref 36.0–46.0)
Hemoglobin: 10.9 g/dL — ABNORMAL LOW (ref 12.0–15.0)
Immature Granulocytes: 0 %
Lymphocytes Relative: 21 %
Lymphs Abs: 1.1 10*3/uL (ref 0.7–4.0)
MCH: 27.6 pg (ref 26.0–34.0)
MCHC: 32.6 g/dL (ref 30.0–36.0)
MCV: 84.6 fL (ref 80.0–100.0)
Monocytes Absolute: 0.4 10*3/uL (ref 0.1–1.0)
Monocytes Relative: 8 %
Neutro Abs: 3.5 10*3/uL (ref 1.7–7.7)
Neutrophils Relative %: 69 %
Platelet Count: 219 10*3/uL (ref 150–400)
RBC: 3.95 MIL/uL (ref 3.87–5.11)
RDW: 14.7 % (ref 11.5–15.5)
WBC Count: 5.1 10*3/uL (ref 4.0–10.5)
nRBC: 0 % (ref 0.0–0.2)

## 2021-05-01 LAB — CMP (CANCER CENTER ONLY)
ALT: 18 U/L (ref 0–44)
AST: 17 U/L (ref 15–41)
Albumin: 4.1 g/dL (ref 3.5–5.0)
Alkaline Phosphatase: 106 U/L (ref 38–126)
Anion gap: 8 (ref 5–15)
BUN: 20 mg/dL (ref 6–20)
CO2: 27 mmol/L (ref 22–32)
Calcium: 9 mg/dL (ref 8.9–10.3)
Chloride: 106 mmol/L (ref 98–111)
Creatinine: 0.77 mg/dL (ref 0.44–1.00)
GFR, Estimated: >60 mL/min (ref >60)
Glucose, Bld: 100 mg/dL — ABNORMAL HIGH (ref 70–99)
Potassium: 3.7 mmol/L (ref 3.5–5.1)
Sodium: 141 mmol/L (ref 135–145)
Total Bilirubin: 0.4 mg/dL (ref 0.3–1.2)
Total Protein: 7.1 g/dL (ref 6.5–8.1)

## 2021-05-01 MED ORDER — SODIUM CHLORIDE 0.9% FLUSH
10.0000 mL | Freq: Once | INTRAVENOUS | Status: AC
  Administered 2021-05-01: 10 mL

## 2021-05-01 MED ORDER — SODIUM CHLORIDE 0.9 % IV SOLN: Freq: Once | INTRAVENOUS | Status: AC

## 2021-05-01 MED ORDER — DIPHENHYDRAMINE HCL 25 MG PO CAPS
25.0000 mg | ORAL_CAPSULE | Freq: Once | ORAL | Status: AC
  Administered 2021-05-01: 25 mg via ORAL
  Filled 2021-05-01: qty 1

## 2021-05-01 MED ORDER — TRASTUZUMAB-DKST CHEMO 150 MG IV SOLR
6.0000 mg/kg | Freq: Once | INTRAVENOUS | Status: AC
  Administered 2021-05-01: 546 mg via INTRAVENOUS
  Filled 2021-05-01: qty 26

## 2021-05-01 MED ORDER — ACETAMINOPHEN 325 MG PO TABS
650.0000 mg | ORAL_TABLET | Freq: Once | ORAL | Status: AC
  Administered 2021-05-01: 650 mg via ORAL
  Filled 2021-05-01: qty 2

## 2021-05-01 MED ORDER — SODIUM CHLORIDE 0.9% FLUSH
10.0000 mL | INTRAVENOUS | Status: DC | PRN
  Administered 2021-05-01: 10 mL

## 2021-05-01 MED ORDER — SODIUM CHLORIDE 0.9 % IV SOLN
420.0000 mg | Freq: Once | INTRAVENOUS | Status: AC
  Administered 2021-05-01: 420 mg via INTRAVENOUS
  Filled 2021-05-01: qty 14

## 2021-05-01 MED ORDER — HEPARIN SOD (PORK) LOCK FLUSH 100 UNIT/ML IV SOLN
500.0000 [IU] | Freq: Once | INTRAVENOUS | Status: AC | PRN
  Administered 2021-05-01: 500 [IU]

## 2021-05-05 ENCOUNTER — Ambulatory Visit (INDEPENDENT_AMBULATORY_CARE_PROVIDER_SITE_OTHER): Payer: BC Managed Care – PPO | Admitting: Ophthalmology

## 2021-05-05 ENCOUNTER — Other Ambulatory Visit: Payer: Self-pay

## 2021-05-05 ENCOUNTER — Encounter (INDEPENDENT_AMBULATORY_CARE_PROVIDER_SITE_OTHER): Payer: Self-pay | Admitting: Ophthalmology

## 2021-05-05 DIAGNOSIS — H3321 Serous retinal detachment, right eye: Secondary | ICD-10-CM | POA: Diagnosis not present

## 2021-05-05 DIAGNOSIS — H33311 Horseshoe tear of retina without detachment, right eye: Secondary | ICD-10-CM

## 2021-05-05 DIAGNOSIS — Z9889 Other specified postprocedural states: Secondary | ICD-10-CM

## 2021-05-05 DIAGNOSIS — H4311 Vitreous hemorrhage, right eye: Secondary | ICD-10-CM | POA: Diagnosis not present

## 2021-05-05 DIAGNOSIS — H269 Unspecified cataract: Secondary | ICD-10-CM | POA: Diagnosis not present

## 2021-05-05 DIAGNOSIS — H43811 Vitreous degeneration, right eye: Secondary | ICD-10-CM | POA: Diagnosis not present

## 2021-05-05 MED ORDER — PREDNISOLONE ACETATE 1 % OP SUSP: 1.0000 [drp] | Freq: Four times a day (QID) | OPHTHALMIC | 0 refills | Status: AC

## 2021-05-05 NOTE — Progress Notes (Signed)
[facility] Note  05/05/2021     CHIEF COMPLAINT Patient presents for Retina Evaluation   HISTORY OF PRESENT ILLNESS: Sandy Goodwin is a 51 y.o. female who presents to the clinic today for:   HPI     Retina Evaluation   In right eye.  This started 2 weeks ago.  Associated Symptoms Flashes and Floaters.  I, the attending physician,  performed the HPI with the patient and updated documentation appropriately.        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 Nicki Reaper for HST OD- 1/12th she noticed spots in vision.  Seen Dr. Nicki Reaper he thought it was a migraine.  1/17th noticed lines, FOLs, spots and lasted 3 days.  Yesterday, everything started back with filmy vision and thousands of floater.  Went back to Dr. Nicki Reaper this morning Dx HST. Lasik 03/2017   Finished chemo and radiation for breast cancer.  Still doing infusions.       Last edited by Bernarda Caffey, MD on 05/05/2021  4:48 PM.     Patient states started seeing spots in vision on 04/24/2021. On 01/17/203, patient noticed spots, FOL, and lines. These symptoms lasted 3 days.   Referring physician: Macarthur Critchley, [address].  [place], [place] [address]  HISTORICAL INFORMATION:   Selected notes from the MEDICAL RECORD NUMBER Referred by Dr. Nicki Reaper LEE: 01.23.23 Ocular Hx- HST OD, PVD OD    CURRENT MEDICATIONS: Current Outpatient Medications (Ophthalmic Drugs)  Medication Sig   prednisoLONE acetate (PRED FORTE) 1 % ophthalmic suspension Place 1 drop into the right eye 4 (four) times daily for 7 days.   No current facility-administered medications for this visit. (Ophthalmic Drugs)   Current Outpatient Medications (Other)  Medication Sig   BYSTOLIC 5 MG tablet Take 1 tablet by mouth daily.   cetirizine (ZYRTEC) 10 MG tablet Take 10 mg by mouth daily.   clobetasol ointment (TEMOVATE) 0.05 % Apply topically 2 (two) times daily.   levothyroxine (SYNTHROID, LEVOTHROID) 50 MCG tablet Take 50 mcg by mouth  daily before breakfast.   lidocaine-prilocaine (EMLA) cream APPLY TO THE AFFECTED AREA(S) DAILY   LORazepam (ATIVAN) 0.5 MG tablet Take 1 tablet (0.5mg  tablet) at bedtime, as needed for nausea.   metroNIDAZOLE (METROCREAM) 0.75 % cream Apply 1 application topically 2 (two) times daily.   pantoprazole (PROTONIX) 40 MG tablet Take 40 mg by mouth daily.   triamcinolone (NASACORT) 55 MCG/ACT AERO nasal inhaler Place 2 sprays into the nose daily.   valACYclovir (VALTREX) 1000 MG tablet Take 1 tablet (1,000 mg total) by mouth 2 (two) times daily.   famotidine (PEPCID) 10 MG tablet Take 10 mg by mouth 2 (two) times daily. (Patient not taking: Reported on 05/05/2021)   meloxicam (MOBIC) 15 MG tablet Take 15 mg by mouth daily. (Patient not taking: Reported on 05/05/2021)   Turmeric 500 MG CAPS Take by mouth. (Patient not taking: Reported on 05/05/2021)   No current facility-administered medications for this visit. (Other)   REVIEW OF SYSTEMS: ROS   Positive for: Endocrine, Eyes Negative for: Constitutional, Gastrointestinal, Neurological, Skin, Genitourinary, Musculoskeletal, HENT, Cardiovascular, Respiratory, Psychiatric, Allergic/Imm, Heme/Lymph Last edited by Leonie Douglas, COA on 05/05/2021  2:41 PM.     ALLERGIES Allergies  Allergen Reactions   Codeine Other (See Comments)    Makes pt agittated and stay awake    Hydrocodone Other (See Comments)    Makes pt agittated and stay awake    PAST MEDICAL  HISTORY Past Medical History:  Diagnosis Date   Arthritis    Breast cancer ([place])    Complication of anesthesia    Itching   Dysrhythmia    Tachycardia   Hypothyroidism    Past Surgical History:  Procedure Laterality Date   ABDOMINAL HYSTERECTOMY     APPENDECTOMY  2008   Bowel Rupture Repair  2008   Ruptured during appendectomy   Breast Biopsy Right 1999   BREAST LUMPECTOMY WITH RADIOACTIVE SEED AND SENTINEL LYMPH NODE BIOPSY Right 12/11/2020   Procedure: RIGHT BREAST LUMPECTOMY WITH  RADIOACTIVE SEED X2 AND RIGHT AXILLARY SENTINEL LYMPH NODE BIOPSY;  Surgeon: Rolm Bookbinder, MD;  Location: [facility];  Service: General;  Laterality: Right;   BREAST RECONSTRUCTION Right 12/20/2020   Procedure: RIGHT ONCOPLASTIC RECONSTRUCTION;  Surgeon: Irene Limbo, MD;  Location: [facility];  Service: Plastics;  Laterality: Right;   BREAST REDUCTION SURGERY Left 12/20/2020   Procedure: LEFT BREAST REDUCTION;  Surgeon: Irene Limbo, MD;  Location: [facility];  Service: Plastics;  Laterality: Left;   COLONOSCOPY N/A 06/15/2016   Procedure: COLONOSCOPY;  Surgeon: Rogene Houston, MD;  [facility];  Service: Endoscopy;  Laterality: N/A;  845   IBF  [id number]   INCISION AND DRAINAGE OF WOUND Right 01/10/2021   Procedure: INCISION AND DRAINAGE OF RIGHT AXILLA;  Surgeon: Irene Limbo, MD;  Location: [facility];  Service: Plastics;  Laterality: Right;   PORTACATH PLACEMENT Left 07/23/2020   Procedure: INSERTION PORT-A-CATH;  Surgeon: Rolm Bookbinder, MD;  Location: [facility];  Service: General;  Laterality: Left;   THYROIDECTOMY, PARTIAL Bilateral 2012    FAMILY HISTORY Family History  Problem Relation Age of Onset   Hypertension Mother    Breast cancer Mother    Lymphoma Mother    Hypertension Brother    Breast cancer Maternal Aunt    Lymphoma Maternal Grandfather    Breast cancer Maternal Aunt     SOCIAL HISTORY Social History   Tobacco Use   Smoking status: Never   Smokeless tobacco: Never  Vaping Use   Vaping Use: Never used  Substance Use Topics   Alcohol use: No   Drug use: No       OPHTHALMIC EXAM:  Base Eye Exam     Visual Acuity (Snellen - Linear)       Right Left   Dist [place] 20/30 20/25   Dist ph [place] 20/25 20/20 -1         Tonometry (Tonopen, 2:50 PM)       Right Left   Pressure 12 14         Pupils       Dark Light Shape React APD   Right 3 2  Round Brisk None   Left 3 2 Round Brisk None         Visual Fields (Counting fingers)       Left Right    Full Full         Extraocular Movement       Right Left    Full Full         Neuro/Psych     Oriented x3: Yes   Mood/Affect: Normal           Slit Lamp and Fundus Exam     External Exam       Right Left   External Normal Normal         Slit Lamp  Exam       Right Left   Lids/Lashes dermatochalasis, mild telangiectasias dermatochalasis, mld telangiectasia   Conjunctiva/Sclera White and quiet White and quiet   Cornea 1-2 + inferior PEE, well healed LASIK flap trace PEE, mild tear film debris, well healed LASIK flap   Anterior Chamber Deep and clear Deep and quiet   Iris Round and dilated Round and reactive   Lens 1-2 + cortical 1-2+ cortical   Anterior Vitreous syneresis, + pigment/RBC, debris settling inferiorly mild syneresis, condensations         Fundus Exam       Right Left   Disc pink and sharp, compact pink and sharp   C/D Ratio 0.2 0.2.   Macula flat, blunted foveal reflex, no heme or edema flat, good foveal reflex, mild RPE mottling, no heme or edema   Vessels mild attenuation, tortuosity Normal   Periphery large HST at 0130 with cuff of SRF surrounding--focal RD, blood clot on flap, otherwise attached on 360 scleral depressed exam attached, no heme           Refraction     Manifest Refraction       Sphere Cylinder Dist VA   Right -0.50 Sphere 20/20   Left -0.50 Sphere 20/20            IMAGING AND PROCEDURES  Imaging and Procedures for 05/05/2021  OCT, Retina - OU - Both Eyes       Right Eye Quality was good. Central Foveal Thickness: 276. Progression has no prior data. Findings include normal foveal contour, no IRF, subretinal fluid (Retinal tear with SRF, SN periphery, caught on widefield).   Left Eye Quality was good. Central Foveal Thickness: 282. Progression has no prior data. Findings include normal foveal  contour, no IRF, no SRF, vitreomacular adhesion .   Notes *Images captured and stored on drive  Diagnosis / Impression:  NFP, no IRF/SRF centrally OU OD: Retinal tear with SRF SN periphery, caught on widefield  Clinical management:  See below  Abbreviations: NFP - Normal foveal profile. CME - cystoid macular edema. PED - pigment epithelial detachment. IRF - intraretinal fluid. SRF - subretinal fluid. EZ - ellipsoid zone. ERM - epiretinal membrane. ORA - outer retinal atrophy. ORT - outer retinal tubulation. SRHM - subretinal hyper-reflective material. IRHM - intraretinal hyper-reflective material      Repair Retinal Detach, Photocoag - OD - Right Eye       LASER PROCEDURE NOTE  Procedure:  Barrier laser retinopexy using slit lamp laser, RIGHT eye   Diagnosis:   Retinal tear w/ surrounding SRF / focal RD, RIGHT eye                     Flap tear at 0130 o'clock anterior to equator   Surgeon: Bernarda Caffey, MD, PhD  Anesthesia: Topical  Informed consent obtained, operative eye marked, and time out performed prior to initiation of laser.   Laser settings:  Lumenis [id number] laser, slit lamp Lens: Mainster PRP 165 Power: 380 mW Spot size: 200 microns Duration: 30 msec  # spots: 740  Placement of laser: Using a Mainster PRP 165 contact lens at the slit lamp, laser was placed in three confluent rows around flap tear and surrounding SRF at 0130 oclock anterior to equator with additional rows anteriorly. Additional anterior barricade was completed using the indirect laser ophthalmoscope w/ scleral depression: 339 spots, 400 mW power, 70 ms duration.  Complications: None.  Patient tolerated the procedure well  and received written and verbal post-procedure care information/education.              ASSESSMENT/PLAN:    ICD-10-CM   1. Retinal tear of right eye  H33.311 OCT, Retina - OU - Both Eyes    prednisoLONE acetate (PRED FORTE) 1 % ophthalmic suspension    2. Right  retinal detachment  H33.21 OCT, Retina - OU - Both Eyes    Repair Retinal Detach, Photocoag - OD - Right Eye    3. Hx of LASIK  Z98.890     4. 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 Posterior vitreous detachment of right eye  H43.811     6. Vitreous hemorrhage of right eye (HCC)  H43.11       1,2. Retinal tear with +SRF / focal RD, OD  - The incidence, risk factors, and natural history of retinal tear was discussed with patient.            - Potential treatment options including laser retinopexy and cryotherapy discussed with patient.          - tear located at 0130 w/ blood clot on flap and +SRF surrounding -- focal RD          - recommend laser retinopexy OD today          - RBA of procedure discussed, questions answered - informed consent obtained and signed - see procedure note - start Lotemax SM QID OD x7 days -- samples given - f/u in 1 wk  3,4. Hemorrhagic PVD OD  - Onset ~2 wks -- w/ symptomatic flashes and floaters -- persistent  - Discussed findings and prognosis  - RT/ RD as above  - Reviewed s/s of RT/RD  - Strict return precautions for any such RT/RD signs/symptoms  - VH precautions reviewed -- minimize activities, keep head elevated, avoid ASA/NSAIDs/blood thinners as able  - F/U 1 wk  5. Hx of LASIK w/ Dr. Lucita Ferrara  - stable  6. Cortical cataracts OU - The symptoms of cataract, surgical options, and treatments and risks were discussed with patient. - discussed diagnosis and progression - not yet visually significant - monitor for now  Ophthalmic Meds Ordered this visit:  Meds ordered this encounter  Medications   prednisoLONE acetate (PRED FORTE) 1 % ophthalmic suspension    Sig: Place 1 drop into the right eye 4 (four) times daily for 7 days.    Dispense:  10 mL    Refill:  0     Return in about 1 week (around 05/12/2021) for POV, Laser OD.  There are no Patient Instructions on file for this visit.   Explained the diagnoses, plan, and  follow up with the patient and they expressed understanding.  Patient expressed understanding of the importance of proper follow up care.   This document serves as a record of services personally performed by Gardiner Sleeper, MD, PhD. It was created on their behalf by Roselee Nova, COMT. The creation of this record is the provider's dictation and/or activities during the visit.  Electronically signed by: Roselee Nova, COMT 05/05/21 5:09 PM   Gardiner Sleeper, M.D., Ph.D. Diseases & Surgery of the Retina and [place] 05/05/2021  I have reviewed the above documentation for accuracy and completeness, and I agree with the above. Gardiner Sleeper, M.D., Ph.D. 05/05/21 5:09 PM    Abbreviations: M myopia (nearsighted); A astigmatism; H hyperopia (farsighted); P presbyopia;  Mrx spectacle prescription;  CTL contact lenses; OD right eye; OS left eye; OU both eyes  XT exotropia; ET esotropia; PEK punctate epithelial keratitis; PEE punctate epithelial erosions; DES dry eye syndrome; MGD meibomian gland dysfunction; ATs artificial tears; PFAT's preservative free artificial tears; [place] nuclear sclerotic cataract; PSC posterior subcapsular cataract; ERM epi-retinal membrane; PVD posterior vitreous detachment; RD retinal detachment; DM diabetes mellitus; DR diabetic retinopathy; NPDR non-proliferative diabetic retinopathy; PDR proliferative diabetic retinopathy; CSME clinically significant macular edema; DME diabetic macular edema; dbh dot blot hemorrhages; CWS cotton wool spot; POAG primary open angle glaucoma; C/D cup-to-disc ratio; HVF humphrey visual field; GVF goldmann visual field; OCT optical coherence tomography; IOP intraocular pressure; BRVO Branch retinal vein occlusion; CRVO central retinal vein occlusion; CRAO central retinal artery occlusion; BRAO branch retinal artery occlusion; RT retinal tear; SB scleral buckle; PPV pars plana vitrectomy; VH Vitreous hemorrhage; PRP  panretinal laser photocoagulation; IVK intravitreal kenalog; VMT vitreomacular traction; MH Macular hole;  NVD neovascularization of the disc; NVE neovascularization elsewhere; AREDS age related eye disease study; ARMD age related macular degeneration; POAG primary open angle glaucoma; EBMD epithelial/anterior basement membrane dystrophy; ACIOL anterior chamber intraocular lens; IOL intraocular lens; PCIOL posterior chamber intraocular lens; Phaco/IOL phacoemulsification with intraocular lens placement; [place] photorefractive keratectomy; LASIK laser assisted in situ keratomileusis; HTN hypertension; DM diabetes mellitus; COPD chronic obstructive pulmonary disease

## 2021-05-09 NOTE — Progress Notes (Signed)
[facility] Note  05/12/2021     CHIEF COMPLAINT Patient presents for Retina Follow Up  HISTORY OF PRESENT ILLNESS: Sandy Goodwin is a 51 y.o. female who presents to the clinic today for:   HPI     Retina Follow Up   Patient presents with  Other.  In right eye.  This started 1 week ago.  I, the attending physician,  performed the HPI with the patient and updated documentation appropriately.        Comments   Patient here for 1 weeks retina follow up for retinal tear OD. Patient states vision is ok. OD is blurry sometimes. No eye pain.       Last edited by Bernarda Caffey, MD on 05/14/2021  1:51 AM.    Pt has been doing well.  Referring physician: Macarthur Critchley, [address]  [place], [place] [address]  HISTORICAL INFORMATION:   Selected notes from the MEDICAL RECORD NUMBER Referred by Dr. Nicki Reaper LEE: 01.23.23 Ocular Hx- HST OD, PVD OD   CURRENT MEDICATIONS: No current outpatient medications on file. (Ophthalmic Drugs)   No current facility-administered medications for this visit. (Ophthalmic Drugs)   Current Outpatient Medications (Other)  Medication Sig   BYSTOLIC 5 MG tablet Take 1 tablet by mouth daily.   cetirizine (ZYRTEC) 10 MG tablet Take 10 mg by mouth daily.   clobetasol ointment (TEMOVATE) 0.05 % Apply topically 2 (two) times daily.   levothyroxine (SYNTHROID, LEVOTHROID) 50 MCG tablet Take 50 mcg by mouth daily before breakfast.   lidocaine-prilocaine (EMLA) cream APPLY TO THE AFFECTED AREA(S) DAILY   metroNIDAZOLE (METROCREAM) 0.75 % cream Apply 1 application topically 2 (two) times daily.   pantoprazole (PROTONIX) 40 MG tablet Take 40 mg by mouth daily.   triamcinolone (NASACORT) 55 MCG/ACT AERO nasal inhaler Place 2 sprays into the nose daily.   valACYclovir (VALTREX) 1000 MG tablet Take 1 tablet (1,000 mg total) by mouth 2 (two) times daily.   famotidine (PEPCID) 10 MG tablet Take 10 mg by mouth 2 (two) times daily.  (Patient not taking: Reported on 05/05/2021)   LORazepam (ATIVAN) 0.5 MG tablet Take 1 tablet (0.5mg  tablet) at bedtime, as needed for nausea.   meloxicam (MOBIC) 15 MG tablet Take 15 mg by mouth daily. (Patient not taking: Reported on 05/05/2021)   Turmeric 500 MG CAPS Take by mouth. (Patient not taking: Reported on 05/05/2021)   No current facility-administered medications for this visit. (Other)   REVIEW OF SYSTEMS: ROS   Positive for: Endocrine, Eyes Negative for: Constitutional, Gastrointestinal, Neurological, Skin, Genitourinary, Musculoskeletal, HENT, Cardiovascular, Respiratory, Psychiatric, Allergic/Imm, Heme/Lymph Last edited by Theodore Demark, COA on 05/12/2021  9:38 AM.     ALLERGIES Allergies  Allergen Reactions   Codeine Other (See Comments)    Makes pt agittated and stay awake    Hydrocodone Other (See Comments)    Makes pt agittated and stay awake    PAST MEDICAL HISTORY Past Medical History:  Diagnosis Date   Arthritis    Breast cancer ([place])    Complication of anesthesia    Itching   Dysrhythmia    Tachycardia   Hypothyroidism    Past Surgical History:  Procedure Laterality Date   ABDOMINAL HYSTERECTOMY     APPENDECTOMY  2008   Bowel Rupture Repair  2008   Ruptured during appendectomy   Breast Biopsy Right 1999   BREAST LUMPECTOMY WITH RADIOACTIVE SEED AND SENTINEL LYMPH NODE BIOPSY Right 12/11/2020  Procedure: RIGHT BREAST LUMPECTOMY WITH RADIOACTIVE SEED X2 AND RIGHT AXILLARY SENTINEL LYMPH NODE BIOPSY;  Surgeon: Rolm Bookbinder, MD;  Location: [place];  Service: General;  Laterality: Right;   BREAST RECONSTRUCTION Right 12/20/2020   Procedure: RIGHT ONCOPLASTIC RECONSTRUCTION;  Surgeon: Irene Limbo, MD;  Location: [place];  Service: Plastics;  Laterality: Right;   BREAST REDUCTION SURGERY Left 12/20/2020   Procedure: LEFT BREAST REDUCTION;  Surgeon: Irene Limbo, MD;  Location: [place];   Service: Plastics;  Laterality: Left;   COLONOSCOPY N/A 06/15/2016   Procedure: COLONOSCOPY;  Surgeon: Rogene Houston, MD;  [facility];  Service: Endoscopy;  Laterality: N/A;  845   IBF  [id number]   INCISION AND DRAINAGE OF WOUND Right 01/10/2021   Procedure: INCISION AND DRAINAGE OF RIGHT AXILLA;  Surgeon: Irene Limbo, MD;  Location: [place];  Service: Plastics;  Laterality: Right;   PORTACATH PLACEMENT Left 07/23/2020   Procedure: INSERTION PORT-A-CATH;  Surgeon: Rolm Bookbinder, MD;  Location: [place];  Service: General;  Laterality: Left;   THYROIDECTOMY, PARTIAL Bilateral 2012   FAMILY HISTORY Family History  Problem Relation Age of Onset   Hypertension Mother    Breast cancer Mother    Lymphoma Mother    Hypertension Brother    Breast cancer Maternal Aunt    Lymphoma Maternal Grandfather    Breast cancer Maternal Aunt    SOCIAL HISTORY Social History   Tobacco Use   Smoking status: Never   Smokeless tobacco: Never  Vaping Use   Vaping Use: Never used  Substance Use Topics   Alcohol use: No   Drug use: No       OPHTHALMIC EXAM:  Base Eye Exam     Visual Acuity (Snellen - Linear)       Right Left   Dist [place] 20/25 20/20 -2   Dist ph [place] 20/20 -2          Tonometry (Tonopen, 9:36 AM)       Right Left   Pressure 12 11         Pupils       Dark Light Shape React APD   Right 3 2 Round Brisk None   Left 3 2 Round Brisk None         Visual Fields (Counting fingers)       Left Right    Full Full         Extraocular Movement       Right Left    Full, Ortho Full, Ortho         Neuro/Psych     Oriented x3: Yes   Mood/Affect: Normal         Dilation     Both eyes: 1.0% Mydriacyl, 2.5% Phenylephrine @ 9:36 AM           Slit Lamp and Fundus Exam     External Exam       Right Left   External Normal Normal         Slit Lamp Exam       Right Left   Lids/Lashes  dermatochalasis, mild telangiectasias dermatochalasis, mld telangiectasia   Conjunctiva/Sclera White and quiet White and quiet   Cornea 1-2 + inferior PEE, well healed LASIK flap trace PEE, mild tear film debris, well healed LASIK flap   Anterior Chamber Deep and clear Deep and quiet   Iris Round and dilated Round and reactive  Lens 1-2 + cortical 1-2+ cortical   Anterior Vitreous syneresis, + pigment/RBC, debris settling inferiorly mild syneresis, condensations         Fundus Exam       Right Left   Disc pink and sharp, compact pink and sharp   C/D Ratio 0.2 0.2.   Macula flat, blunted foveal reflex, no heme or edema flat, good foveal reflex, mild RPE mottling, no heme or edema   Vessels mild attenuation, tortuosity Normal   Periphery large HST at 0130 with cuff of SRF surrounding--focal RD--now with good early laser changes surrounding--not fully matured, blood clot on flap improved, otherwise attached on 360 scleral depressed exam attached, no heme           IMAGING AND PROCEDURES  Imaging and Procedures for 05/12/2021  OCT, Retina - OU - Both Eyes       Right Eye Quality was good. Central Foveal Thickness: 280. Progression has no prior data. Findings include normal foveal contour, no IRF, subretinal fluid (Retinal tear with SRF, SN periphery, caught on widefield, interval improvement in vitreous opacities).   Left Eye Quality was good. Central Foveal Thickness: 285. Progression has no prior data. Findings include normal foveal contour, no IRF, no SRF, vitreomacular adhesion .   Notes *Images captured and stored on drive  Diagnosis / Impression:  NFP, no IRF/SRF centrally OU OD: Retinal tear with SRF, SN periphery, caught on widefield, interval improvement in vitreous opacities  Clinical management:  See below  Abbreviations: NFP - Normal foveal profile. CME - cystoid macular edema. PED - pigment epithelial detachment. IRF - intraretinal fluid. SRF - subretinal  fluid. EZ - ellipsoid zone. ERM - epiretinal membrane. ORA - outer retinal atrophy. ORT - outer retinal tubulation. SRHM - subretinal hyper-reflective material. IRHM - intraretinal hyper-reflective material               ASSESSMENT/PLAN:    ICD-10-CM   1. Retinal tear of right eye  H33.311 OCT, Retina - OU - Both Eyes    2. Right retinal detachment  H33.21     3. Posterior vitreous detachment of right eye  H43.811     4. Vitreous hemorrhage of right eye ([place])  H43.11     5. Hx of LASIK  Z98.890     6. Cortical age-related cataract of both eyes  H25.013      1,2. Retinal tear with +SRF / focal RD, OD           - tear located at 0130 w/ blood clot on flap and +SRF surrounding -- focal RD          - s/p laser retinopexy 1.23.23  - good early laser changes surrounding focal RD -- no yet fully mature             - F/u in 1-2 wks  3,4. Hemorrhagic PVD OD  - Onset ~2 wks -- w/ symptomatic flashes and floaters -- improving  - Discussed findings and prognosis  - RT/ RD as above  - Reviewed s/s of RT/RD  - Strict return precautions for any such RT/RD signs/symptoms  - VH precautions reviewed -- minimize activities, keep head elevated, avoid ASA/NSAIDs/blood thinners as able  5. Hx of LASIK w/ Dr. Lucita Ferrara  - stable  6. Cortical cataracts OU - The symptoms of cataract, surgical options, and treatments and risks were discussed with patient. - discussed diagnosis and progression - not yet visually significant - monitor for now  Ophthalmic Meds Ordered  this visit:  No orders of the defined types were placed in this encounter.    Return for 1-2 wk f/u for laser retinopexy OD w/DFE&OCT.  There are no Patient Instructions on file for this visit.  Explained the diagnoses, plan, and follow up with the patient and they expressed understanding.  Patient expressed understanding of the importance of proper follow up care.   This document serves as a record of services  personally performed by Gardiner Sleeper, MD, PhD. It was created on their behalf by Roselee Nova, COMT. The creation of this record is the provider's dictation and/or activities during the visit.  Electronically signed by: Roselee Nova, COMT 05/14/21 1:52 AM  This document serves as a record of services personally performed by Gardiner Sleeper, MD, PhD. It was created on their behalf by Estill Bakes, COT an ophthalmic technician. The creation of this record is the provider's dictation and/or activities during the visit.    Electronically signed by: Estill Bakes, COT 1.30.23 @ 1:52 AM    Gardiner Sleeper, M.D., Ph.D. Diseases & Surgery of the Retina and [place] 1.30.23  I have reviewed the above documentation for accuracy and completeness, and I agree with the above. Gardiner Sleeper, M.D., Ph.D. 05/14/21 1:57 AM   Abbreviations: M myopia (nearsighted); A astigmatism; H hyperopia (farsighted); P presbyopia; Mrx spectacle prescription;  CTL contact lenses; OD right eye; OS left eye; OU both eyes  XT exotropia; ET esotropia; PEK punctate epithelial keratitis; PEE punctate epithelial erosions; DES dry eye syndrome; MGD meibomian gland dysfunction; ATs artificial tears; PFAT's preservative free artificial tears; [place] nuclear sclerotic cataract; PSC posterior subcapsular cataract; ERM epi-retinal membrane; PVD posterior vitreous detachment; RD retinal detachment; DM diabetes mellitus; DR diabetic retinopathy; NPDR non-proliferative diabetic retinopathy; PDR proliferative diabetic retinopathy; CSME clinically significant macular edema; DME diabetic macular edema; dbh dot blot hemorrhages; CWS cotton wool spot; POAG primary open angle glaucoma; C/D cup-to-disc ratio; HVF humphrey visual field; GVF goldmann visual field; OCT optical coherence tomography; IOP intraocular pressure; BRVO Branch retinal vein occlusion; CRVO central retinal vein occlusion; CRAO central retinal  artery occlusion; BRAO branch retinal artery occlusion; RT retinal tear; SB scleral buckle; PPV pars plana vitrectomy; VH Vitreous hemorrhage; PRP panretinal laser photocoagulation; IVK intravitreal kenalog; VMT vitreomacular traction; MH Macular hole;  NVD neovascularization of the disc; NVE neovascularization elsewhere; AREDS age related eye disease study; ARMD age related macular degeneration; POAG primary open angle glaucoma; EBMD epithelial/anterior basement membrane dystrophy; ACIOL anterior chamber intraocular lens; IOL intraocular lens; PCIOL posterior chamber intraocular lens; Phaco/IOL phacoemulsification with intraocular lens placement; [place] photorefractive keratectomy; LASIK laser assisted in situ keratomileusis; HTN hypertension; DM diabetes mellitus; COPD chronic obstructive pulmonary disease

## 2021-05-12 ENCOUNTER — Encounter (INDEPENDENT_AMBULATORY_CARE_PROVIDER_SITE_OTHER): Payer: Self-pay | Admitting: Ophthalmology

## 2021-05-12 ENCOUNTER — Other Ambulatory Visit: Payer: Self-pay

## 2021-05-12 ENCOUNTER — Ambulatory Visit (INDEPENDENT_AMBULATORY_CARE_PROVIDER_SITE_OTHER): Payer: BC Managed Care – PPO | Admitting: Ophthalmology

## 2021-05-12 DIAGNOSIS — H25013 Cortical age-related cataract, bilateral: Secondary | ICD-10-CM

## 2021-05-12 DIAGNOSIS — H43811 Vitreous degeneration, right eye: Secondary | ICD-10-CM

## 2021-05-12 DIAGNOSIS — H3321 Serous retinal detachment, right eye: Secondary | ICD-10-CM

## 2021-05-12 DIAGNOSIS — Z9889 Other specified postprocedural states: Secondary | ICD-10-CM

## 2021-05-12 DIAGNOSIS — H33311 Horseshoe tear of retina without detachment, right eye: Secondary | ICD-10-CM

## 2021-05-12 DIAGNOSIS — H4311 Vitreous hemorrhage, right eye: Secondary | ICD-10-CM | POA: Diagnosis not present

## 2021-05-14 ENCOUNTER — Encounter (INDEPENDENT_AMBULATORY_CARE_PROVIDER_SITE_OTHER): Payer: Self-pay | Admitting: Ophthalmology

## 2021-05-16 NOTE — Progress Notes (Incomplete)
[facility] Note  05/19/2021     CHIEF COMPLAINT Patient presents for No chief complaint on file.  HISTORY OF PRESENT ILLNESS: Sandy Goodwin is a 51 y.o. female who presents to the clinic today for:    Pt has been doing well.  Referring physician: Macarthur Critchley, [address].  [place], [place] [address]  HISTORICAL INFORMATION:   Selected notes from the MEDICAL RECORD NUMBER Referred by Dr. Nicki Reaper LEE: 01.23.23 Ocular Hx- HST OD, PVD OD   CURRENT MEDICATIONS: No current outpatient medications on file. (Ophthalmic Drugs)   No current facility-administered medications for this visit. (Ophthalmic Drugs)   Current Outpatient Medications (Other)  Medication Sig   BYSTOLIC 5 MG tablet Take 1 tablet by mouth daily.   cetirizine (ZYRTEC) 10 MG tablet Take 10 mg by mouth daily.   clobetasol ointment (TEMOVATE) 0.05 % Apply topically 2 (two) times daily.   famotidine (PEPCID) 10 MG tablet Take 10 mg by mouth 2 (two) times daily. (Patient not taking: Reported on 05/05/2021)   levothyroxine (SYNTHROID, LEVOTHROID) 50 MCG tablet Take 50 mcg by mouth daily before breakfast.   lidocaine-prilocaine (EMLA) cream APPLY TO THE AFFECTED AREA(S) DAILY   LORazepam (ATIVAN) 0.5 MG tablet Take 1 tablet (0.5mg  tablet) at bedtime, as needed for nausea.   meloxicam (MOBIC) 15 MG tablet Take 15 mg by mouth daily. (Patient not taking: Reported on 05/05/2021)   metroNIDAZOLE (METROCREAM) 0.75 % cream Apply 1 application topically 2 (two) times daily.   pantoprazole (PROTONIX) 40 MG tablet Take 40 mg by mouth daily.   triamcinolone (NASACORT) 55 MCG/ACT AERO nasal inhaler Place 2 sprays into the nose daily.   Turmeric 500 MG CAPS Take by mouth. (Patient not taking: Reported on 05/05/2021)   valACYclovir (VALTREX) 1000 MG tablet Take 1 tablet (1,000 mg total) by mouth 2 (two) times daily.   No current facility-administered medications for this visit. (Other)   REVIEW OF  SYSTEMS:   ALLERGIES Allergies  Allergen Reactions   Codeine Other (See Comments)    Makes pt agittated and stay awake    Hydrocodone Other (See Comments)    Makes pt agittated and stay awake    PAST MEDICAL HISTORY Past Medical History:  Diagnosis Date   Arthritis    Breast cancer ([place])    Complication of anesthesia    Itching   Dysrhythmia    Tachycardia   Hypothyroidism    Past Surgical History:  Procedure Laterality Date   ABDOMINAL HYSTERECTOMY     APPENDECTOMY  2008   Bowel Rupture Repair  2008   Ruptured during appendectomy   Breast Biopsy Right 1999   BREAST LUMPECTOMY WITH RADIOACTIVE SEED AND SENTINEL LYMPH NODE BIOPSY Right 12/11/2020   Procedure: RIGHT BREAST LUMPECTOMY WITH RADIOACTIVE SEED X2 AND RIGHT AXILLARY SENTINEL LYMPH NODE BIOPSY;  Surgeon: Rolm Bookbinder, MD;  Location: [facility];  Service: General;  Laterality: Right;   BREAST RECONSTRUCTION Right 12/20/2020   Procedure: RIGHT ONCOPLASTIC RECONSTRUCTION;  Surgeon: Irene Limbo, MD;  Location: [facility];  Service: Plastics;  Laterality: Right;   BREAST REDUCTION SURGERY Left 12/20/2020   Procedure: LEFT BREAST REDUCTION;  Surgeon: Irene Limbo, MD;  Location: [facility];  Service: Plastics;  Laterality: Left;   COLONOSCOPY N/A 06/15/2016   Procedure: COLONOSCOPY;  Surgeon: Rogene Houston, MD;  [facility];  Service: Endoscopy;  Laterality: N/A;  845   IBF  [id number]  INCISION AND DRAINAGE OF WOUND Right 01/10/2021   Procedure: INCISION AND DRAINAGE OF RIGHT AXILLA;  Surgeon: Irene Limbo, MD;  Location: [place];  Service: Plastics;  Laterality: Right;   PORTACATH PLACEMENT Left 07/23/2020   Procedure: INSERTION PORT-A-CATH;  Surgeon: Rolm Bookbinder, MD;  Location: [place];  Service: General;  Laterality: Left;   THYROIDECTOMY, PARTIAL Bilateral 2012   FAMILY HISTORY Family History  Problem  Relation Age of Onset   Hypertension Mother    Breast cancer Mother    Lymphoma Mother    Hypertension Brother    Breast cancer Maternal Aunt    Lymphoma Maternal Grandfather    Breast cancer Maternal Aunt    SOCIAL HISTORY Social History   Tobacco Use   Smoking status: Never   Smokeless tobacco: Never  Vaping Use   Vaping Use: Never used  Substance Use Topics   Alcohol use: No   Drug use: No       OPHTHALMIC EXAM:  Not recorded    IMAGING AND PROCEDURES  Imaging and Procedures for 05/19/2021             ASSESSMENT/PLAN:  No diagnosis found.  1,2. Retinal tear with +SRF / focal RD, OD           - tear located at 0130 w/ blood clot on flap and +SRF surrounding -- focal RD          - s/p laser retinopexy 1.23.23  - good early laser changes surrounding focal RD -- no yet fully mature             - F/u in 1-2 wks  3,4. Hemorrhagic PVD OD  - Onset ~2 wks -- w/ symptomatic flashes and floaters -- improving  - Discussed findings and prognosis  - RT/ RD as above  - Reviewed s/s of RT/RD  - Strict return precautions for any such RT/RD signs/symptoms  - VH precautions reviewed -- minimize activities, keep head elevated, avoid ASA/NSAIDs/blood thinners as able  5. Hx of LASIK w/ Dr. Lucita Ferrara  - stable  6. Cortical cataracts OU - The symptoms of cataract, surgical options, and treatments and risks were discussed with patient. - discussed diagnosis and progression - not yet visually significant - monitor for now  Ophthalmic Meds Ordered this visit:  No orders of the defined types were placed in this encounter.    No follow-ups on file.  There are no Patient Instructions on file for this visit.  Explained the diagnoses, plan, and follow up with the patient and they expressed understanding.  Patient expressed understanding of the importance of proper follow up care.   This document serves as a record of services personally performed by Gardiner Sleeper,  MD, PhD. It was created on their behalf by Roselee Nova, COMT. The creation of this record is the provider's dictation and/or activities during the visit.  Electronically signed by: Roselee Nova, COMT 05/16/21 12:45 PM     Gardiner Sleeper, M.D., Ph.D. Diseases & Surgery of the Retina and [place] 1.30.23     Abbreviations: M myopia (nearsighted); A astigmatism; H hyperopia (farsighted); P presbyopia; Mrx spectacle prescription;  CTL contact lenses; OD right eye; OS left eye; OU both eyes  XT exotropia; ET esotropia; PEK punctate epithelial keratitis; PEE punctate epithelial erosions; DES dry eye syndrome; MGD meibomian gland dysfunction; ATs artificial tears; PFAT's preservative free artificial tears; [place] nuclear sclerotic cataract; PSC posterior subcapsular cataract;  ERM epi-retinal membrane; PVD posterior vitreous detachment; RD retinal detachment; DM diabetes mellitus; DR diabetic retinopathy; NPDR non-proliferative diabetic retinopathy; PDR proliferative diabetic retinopathy; CSME clinically significant macular edema; DME diabetic macular edema; dbh dot blot hemorrhages; CWS cotton wool spot; POAG primary open angle glaucoma; C/D cup-to-disc ratio; HVF humphrey visual field; GVF goldmann visual field; OCT optical coherence tomography; IOP intraocular pressure; BRVO Branch retinal vein occlusion; CRVO central retinal vein occlusion; CRAO central retinal artery occlusion; BRAO branch retinal artery occlusion; RT retinal tear; SB scleral buckle; PPV pars plana vitrectomy; VH Vitreous hemorrhage; PRP panretinal laser photocoagulation; IVK intravitreal kenalog; VMT vitreomacular traction; MH Macular hole;  NVD neovascularization of the disc; NVE neovascularization elsewhere; AREDS age related eye disease study; ARMD age related macular degeneration; POAG primary open angle glaucoma; EBMD epithelial/anterior basement membrane dystrophy; ACIOL anterior chamber  intraocular lens; IOL intraocular lens; PCIOL posterior chamber intraocular lens; Phaco/IOL phacoemulsification with intraocular lens placement; [place] photorefractive keratectomy; LASIK laser assisted in situ keratomileusis; HTN hypertension; DM diabetes mellitus; COPD chronic obstructive pulmonary disease

## 2021-05-19 ENCOUNTER — Encounter (INDEPENDENT_AMBULATORY_CARE_PROVIDER_SITE_OTHER): Payer: Self-pay | Admitting: Ophthalmology

## 2021-05-19 ENCOUNTER — Ambulatory Visit (INDEPENDENT_AMBULATORY_CARE_PROVIDER_SITE_OTHER): Payer: BC Managed Care – PPO | Admitting: Ophthalmology

## 2021-05-19 ENCOUNTER — Other Ambulatory Visit: Payer: Self-pay

## 2021-05-19 DIAGNOSIS — H3321 Serous retinal detachment, right eye: Secondary | ICD-10-CM

## 2021-05-19 DIAGNOSIS — H4311 Vitreous hemorrhage, right eye: Secondary | ICD-10-CM

## 2021-05-19 DIAGNOSIS — Z9889 Other specified postprocedural states: Secondary | ICD-10-CM

## 2021-05-19 DIAGNOSIS — H25013 Cortical age-related cataract, bilateral: Secondary | ICD-10-CM

## 2021-05-19 DIAGNOSIS — H43811 Vitreous degeneration, right eye: Secondary | ICD-10-CM | POA: Diagnosis not present

## 2021-05-19 DIAGNOSIS — H33311 Horseshoe tear of retina without detachment, right eye: Secondary | ICD-10-CM

## 2021-05-19 NOTE — Progress Notes (Signed)
[facility] Note  05/19/2021     CHIEF COMPLAINT Patient presents for Retina Follow Up  HISTORY OF PRESENT ILLNESS: Sandy Goodwin is a 52 y.o. female who presents to the clinic today for:   HPI     Retina Follow Up   Patient presents with  Retinal Break/Detachment.  In right eye.  Duration of 2 weeks.  Since onset it is stable.  I, the attending physician,  performed the HPI with the patient and updated documentation appropriately.        Comments   2 week follow up s/p Retinopexy OD for retinal tear.  No problems since laser.  Vision appears to be the same.       Last edited by Bernarda Caffey, MD on 05/19/2021  1:39 PM.    Pt states vision is stable, no problems since last appt  Referring physician: Macarthur Critchley, [address].  [place], [place] [address]  HISTORICAL INFORMATION:   Selected notes from the MEDICAL RECORD NUMBER Referred by Dr. Nicki Reaper LEE: 01.23.23 Ocular Hx- HST OD, PVD OD   CURRENT MEDICATIONS: No current outpatient medications on file. (Ophthalmic Drugs)   No current facility-administered medications for this visit. (Ophthalmic Drugs)   Current Outpatient Medications (Other)  Medication Sig   BYSTOLIC 5 MG tablet Take 1 tablet by mouth daily.   cetirizine (ZYRTEC) 10 MG tablet Take 10 mg by mouth daily.   clobetasol ointment (TEMOVATE) 0.05 % Apply topically 2 (two) times daily.   levothyroxine (SYNTHROID, LEVOTHROID) 50 MCG tablet Take 50 mcg by mouth daily before breakfast.   lidocaine-prilocaine (EMLA) cream APPLY TO THE AFFECTED AREA(S) DAILY   LORazepam (ATIVAN) 0.5 MG tablet Take 1 tablet (0.5mg  tablet) at bedtime, as needed for nausea.   metroNIDAZOLE (METROCREAM) 0.75 % cream Apply 1 application topically 2 (two) times daily.   pantoprazole (PROTONIX) 40 MG tablet Take 40 mg by mouth daily.   triamcinolone (NASACORT) 55 MCG/ACT AERO nasal inhaler Place 2 sprays into the nose daily.   valACYclovir (VALTREX)  1000 MG tablet Take 1 tablet (1,000 mg total) by mouth 2 (two) times daily.   famotidine (PEPCID) 10 MG tablet Take 10 mg by mouth 2 (two) times daily. (Patient not taking: Reported on 05/05/2021)   meloxicam (MOBIC) 15 MG tablet Take 15 mg by mouth daily. (Patient not taking: Reported on 05/05/2021)   Turmeric 500 MG CAPS Take by mouth. (Patient not taking: Reported on 05/05/2021)   No current facility-administered medications for this visit. (Other)   REVIEW OF SYSTEMS: ROS   Positive for: Endocrine, Eyes Negative for: Constitutional, Gastrointestinal, Neurological, Skin, Genitourinary, Musculoskeletal, HENT, Cardiovascular, Respiratory, Psychiatric, Allergic/Imm, Heme/Lymph Last edited by Leonie Douglas, COA on 05/19/2021  1:15 PM.      ALLERGIES Allergies  Allergen Reactions   Codeine Other (See Comments)    Makes pt agittated and stay awake    Hydrocodone Other (See Comments)    Makes pt agittated and stay awake    PAST MEDICAL HISTORY Past Medical History:  Diagnosis Date   Arthritis    Breast cancer ([place])    Complication of anesthesia    Itching   Dysrhythmia    Tachycardia   Hypothyroidism    Past Surgical History:  Procedure Laterality Date   ABDOMINAL HYSTERECTOMY     APPENDECTOMY  2008   Bowel Rupture Repair  2008   Ruptured during appendectomy   Breast Biopsy Right 1999   BREAST LUMPECTOMY WITH  RADIOACTIVE SEED AND SENTINEL LYMPH NODE BIOPSY Right 12/11/2020   Procedure: RIGHT BREAST LUMPECTOMY WITH RADIOACTIVE SEED X2 AND RIGHT AXILLARY SENTINEL LYMPH NODE BIOPSY;  Surgeon: Rolm Bookbinder, MD;  Location: [facility];  Service: General;  Laterality: Right;   BREAST RECONSTRUCTION Right 12/20/2020   Procedure: RIGHT ONCOPLASTIC RECONSTRUCTION;  Surgeon: Irene Limbo, MD;  Location: [facility];  Service: Plastics;  Laterality: Right;   BREAST REDUCTION SURGERY Left 12/20/2020   Procedure: LEFT BREAST REDUCTION;  Surgeon: Irene Limbo, MD;  Location: [facility];  Service: Plastics;  Laterality: Left;   COLONOSCOPY N/A 06/15/2016   Procedure: COLONOSCOPY;  Surgeon: Rogene Houston, MD;  [facility];  Service: Endoscopy;  Laterality: N/A;  845   IBF  [id number]   INCISION AND DRAINAGE OF WOUND Right 01/10/2021   Procedure: INCISION AND DRAINAGE OF RIGHT AXILLA;  Surgeon: Irene Limbo, MD;  Location: [facility];  Service: Plastics;  Laterality: Right;   PORTACATH PLACEMENT Left 07/23/2020   Procedure: INSERTION PORT-A-CATH;  Surgeon: Rolm Bookbinder, MD;  Location: [facility];  Service: General;  Laterality: Left;   THYROIDECTOMY, PARTIAL Bilateral 2012   FAMILY HISTORY Family History  Problem Relation Age of Onset   Hypertension Mother    Breast cancer Mother    Lymphoma Mother    Hypertension Brother    Breast cancer Maternal Aunt    Lymphoma Maternal Grandfather    Breast cancer Maternal Aunt    SOCIAL HISTORY Social History   Tobacco Use   Smoking status: Never   Smokeless tobacco: Never  Vaping Use   Vaping Use: Never used  Substance Use Topics   Alcohol use: No   Drug use: No       OPHTHALMIC EXAM:  Base Eye Exam     Visual Acuity (Snellen - Linear)       Right Left   Dist [place] 20/30 20/30   Dist ph [place] 20/20 -1 20/20         Tonometry (Tonopen, 1:20 PM)       Right Left   Pressure 13 14         Pupils       Dark Light Shape React APD   Right 3 2 Round Brisk None   Left 3 2 Round Brisk None         Visual Fields (Counting fingers)       Left Right    Full Full         Extraocular Movement       Right Left    Full Full         Neuro/Psych     Oriented x3: Yes   Mood/Affect: Normal         Dilation     Both eyes: 1.0% Mydriacyl, 2.5% Phenylephrine @ 1:20 PM           Slit Lamp and Fundus Exam     External Exam       Right Left   External Normal Normal         Slit Lamp Exam        Right Left   Lids/Lashes dermatochalasis, mild telangiectasias dermatochalasis, mld telangiectasia   Conjunctiva/Sclera White and quiet White and quiet   Cornea 1-2 + inferior PEE, well healed LASIK flap trace PEE, mild tear film debris, well healed LASIK flap   Anterior Chamber Deep and clear Deep and quiet  Iris Round and dilated Round and reactive   Lens 1-2 + cortical 1-2+ cortical   Anterior Vitreous syneresis, vitreous condensations, debris and old heme settled inferiorly mild syneresis, condensations         Fundus Exam       Right Left   Disc pink and sharp, compact pink and sharp   C/D Ratio 0.2 0.2.   Macula flat, blunted foveal reflex, no heme or edema flat, good foveal reflex, mild RPE mottling, no heme or edema   Vessels mild attenuation, tortuosity Normal   Periphery large HST at 0130 with cuff of SRF surrounding--focal RD--now with good laser changes surrounding, blood clot on flap improved, otherwise attached attached, no heme           IMAGING AND PROCEDURES  Imaging and Procedures for 05/19/2021  OCT, Retina - OU - Both Eyes       Right Eye Quality was good. Central Foveal Thickness: 275. Progression has been stable. Findings include normal foveal contour, no IRF, subretinal fluid (Retinal tear with SRF, SN periphery, caught on widefield, interval improvement in vitreous opacities).   Left Eye Quality was good. Central Foveal Thickness: 284. Progression has been stable. Findings include normal foveal contour, no IRF, no SRF, vitreomacular adhesion .   Notes *Images captured and stored on drive  Diagnosis / Impression:  NFP, no IRF/SRF centrally OU OD: Retinal tear with SRF, SN periphery, caught on widefield, interval improvement in vitreous opacities  Clinical management:  See below  Abbreviations: NFP - Normal foveal profile. CME - cystoid macular edema. PED - pigment epithelial detachment. IRF - intraretinal fluid. SRF - subretinal fluid. EZ -  ellipsoid zone. ERM - epiretinal membrane. ORA - outer retinal atrophy. ORT - outer retinal tubulation. SRHM - subretinal hyper-reflective material. IRHM - intraretinal hyper-reflective material                ASSESSMENT/PLAN:    ICD-10-CM   1. Retinal tear of right eye  H33.311 OCT, Retina - OU - Both Eyes    2. Right retinal detachment  H33.21     3. Posterior vitreous detachment of right eye  H43.811     4. Vitreous hemorrhage of right eye ([place])  H43.11     5. Hx of LASIK  Z98.890     6. Cortical age-related cataract of both eyes  H25.013       1,2. Retinal tear with +SRF / focal RD, OD           - tear located at 0130 w/ blood clot on flap and +SRF surrounding -- focal RD          - s/p laser retinopexy 1.23.23 -- good laser changes surrounding focal RD   - no new RT/RD             - F/u in 3 wks  3,4. Hemorrhagic PVD OD  - Onset ~2 wks -- w/ symptomatic flashes and floaters -- improving  - Discussed findings and prognosis  - RT/ RD as above  - Reviewed s/s of RT/RD  - Strict return precautions for any such RT/RD signs/symptoms  - VH cleared centrally, settling and improving inferiorly  5. Hx of LASIK w/ Dr. Lucita Ferrara  - stable  6. Cortical cataracts OU - The symptoms of cataract, surgical options, and treatments and risks were discussed with patient. - discussed diagnosis and progression - not yet visually significant - monitor for now  Ophthalmic Meds Ordered this visit:  No  orders of the defined types were placed in this encounter.    Return in about 3 weeks (around 06/09/2021) for f/u retinal tear OD, DFE, OCT.  There are no Patient Instructions on file for this visit.  Explained the diagnoses, plan, and follow up with the patient and they expressed understanding.  Patient expressed understanding of the importance of proper follow up care.   This document serves as a record of services personally performed by Gardiner Sleeper, MD, PhD. It was  created on their behalf by Roselee Nova, COMT. The creation of this record is the provider's dictation and/or activities during the visit.  Electronically signed by: Roselee Nova, COMT 05/21/21 11:44 AM   This document serves as a record of services personally performed by Gardiner Sleeper, MD, PhD. It was created on their behalf by San Jetty. Owens Shark, OA an ophthalmic technician. The creation of this record is the provider's dictation and/or activities during the visit.    Electronically signed by: San Jetty. Owens Shark, [place] 02.06.2023 11:44 AM  Gardiner Sleeper, M.D., Ph.D. Diseases & Surgery of the Retina and Vitreous [facility]  I have reviewed the above documentation for accuracy and completeness, and I agree with the above. Gardiner Sleeper, M.D., Ph.D. 05/21/21 11:44 AM  Abbreviations: M myopia (nearsighted); A astigmatism; H hyperopia (farsighted); P presbyopia; Mrx spectacle prescription;  CTL contact lenses; OD right eye; OS left eye; OU both eyes  XT exotropia; ET esotropia; PEK punctate epithelial keratitis; PEE punctate epithelial erosions; DES dry eye syndrome; MGD meibomian gland dysfunction; ATs artificial tears; PFAT's preservative free artificial tears; [place] nuclear sclerotic cataract; PSC posterior subcapsular cataract; ERM epi-retinal membrane; PVD posterior vitreous detachment; RD retinal detachment; DM diabetes mellitus; DR diabetic retinopathy; NPDR non-proliferative diabetic retinopathy; PDR proliferative diabetic retinopathy; CSME clinically significant macular edema; DME diabetic macular edema; dbh dot blot hemorrhages; CWS cotton wool spot; POAG primary open angle glaucoma; C/D cup-to-disc ratio; HVF humphrey visual field; GVF goldmann visual field; OCT optical coherence tomography; IOP intraocular pressure; BRVO Branch retinal vein occlusion; CRVO central retinal vein occlusion; CRAO central retinal artery occlusion; BRAO branch retinal artery occlusion; RT  retinal tear; SB scleral buckle; PPV pars plana vitrectomy; VH Vitreous hemorrhage; PRP panretinal laser photocoagulation; IVK intravitreal kenalog; VMT vitreomacular traction; MH Macular hole;  NVD neovascularization of the disc; NVE neovascularization elsewhere; AREDS age related eye disease study; ARMD age related macular degeneration; POAG primary open angle glaucoma; EBMD epithelial/anterior basement membrane dystrophy; ACIOL anterior chamber intraocular lens; IOL intraocular lens; PCIOL posterior chamber intraocular lens; Phaco/IOL phacoemulsification with intraocular lens placement; [place] photorefractive keratectomy; LASIK laser assisted in situ keratomileusis; HTN hypertension; DM diabetes mellitus; COPD chronic obstructive pulmonary disease

## 2021-05-21 ENCOUNTER — Other Ambulatory Visit: Payer: Self-pay

## 2021-05-21 DIAGNOSIS — C50411 Malignant neoplasm of upper-outer quadrant of right female breast: Secondary | ICD-10-CM

## 2021-05-22 ENCOUNTER — Other Ambulatory Visit: Payer: Self-pay

## 2021-05-22 ENCOUNTER — Encounter: Payer: Self-pay | Admitting: *Deleted

## 2021-05-22 ENCOUNTER — Inpatient Hospital Stay: Payer: BC Managed Care – PPO

## 2021-05-22 ENCOUNTER — Inpatient Hospital Stay: Payer: BC Managed Care – PPO | Attending: Hematology and Oncology

## 2021-05-22 VITALS — BP 119/59 | HR 71 | Temp 97.9°F | Resp 18

## 2021-05-22 DIAGNOSIS — Z95828 Presence of other vascular implants and grafts: Secondary | ICD-10-CM

## 2021-05-22 DIAGNOSIS — Z5112 Encounter for antineoplastic immunotherapy: Secondary | ICD-10-CM | POA: Diagnosis not present

## 2021-05-22 DIAGNOSIS — Z170 Estrogen receptor positive status [ER+]: Secondary | ICD-10-CM

## 2021-05-22 DIAGNOSIS — C50411 Malignant neoplasm of upper-outer quadrant of right female breast: Secondary | ICD-10-CM

## 2021-05-22 LAB — CBC WITH DIFFERENTIAL (CANCER CENTER ONLY)
Abs Immature Granulocytes: 0.01 10*3/uL (ref 0.00–0.07)
Basophils Absolute: 0 10*3/uL (ref 0.0–0.1)
Basophils Relative: 1 %
Eosinophils Absolute: 0.1 10*3/uL (ref 0.0–0.5)
Eosinophils Relative: 2 %
HCT: 33.2 % — ABNORMAL LOW (ref 36.0–46.0)
Hemoglobin: 10.9 g/dL — ABNORMAL LOW (ref 12.0–15.0)
Immature Granulocytes: 0 %
Lymphocytes Relative: 29 %
Lymphs Abs: 1.4 10*3/uL (ref 0.7–4.0)
MCH: 27.3 pg (ref 26.0–34.0)
MCHC: 32.8 g/dL (ref 30.0–36.0)
MCV: 83 fL (ref 80.0–100.0)
Monocytes Absolute: 0.5 10*3/uL (ref 0.1–1.0)
Monocytes Relative: 10 %
Neutro Abs: 2.8 10*3/uL (ref 1.7–7.7)
Neutrophils Relative %: 58 %
Platelet Count: 247 10*3/uL (ref 150–400)
RBC: 4 MIL/uL (ref 3.87–5.11)
RDW: 14.8 % (ref 11.5–15.5)
WBC Count: 4.8 10*3/uL (ref 4.0–10.5)
nRBC: 0 % (ref 0.0–0.2)

## 2021-05-22 LAB — CMP (CANCER CENTER ONLY)
ALT: 17 U/L (ref 0–44)
AST: 17 U/L (ref 15–41)
Albumin: 4.1 g/dL (ref 3.5–5.0)
Alkaline Phosphatase: 118 U/L (ref 38–126)
Anion gap: 6 (ref 5–15)
BUN: 19 mg/dL (ref 6–20)
CO2: 29 mmol/L (ref 22–32)
Calcium: 8.9 mg/dL (ref 8.9–10.3)
Chloride: 105 mmol/L (ref 98–111)
Creatinine: 0.71 mg/dL (ref 0.44–1.00)
GFR, Estimated: >60 mL/min (ref >60)
Glucose, Bld: 106 mg/dL — ABNORMAL HIGH (ref 70–99)
Potassium: 4 mmol/L (ref 3.5–5.1)
Sodium: 140 mmol/L (ref 135–145)
Total Bilirubin: 0.4 mg/dL (ref 0.3–1.2)
Total Protein: 7.2 g/dL (ref 6.5–8.1)

## 2021-05-22 MED ORDER — SODIUM CHLORIDE 0.9% FLUSH
10.0000 mL | INTRAVENOUS | Status: DC | PRN
  Administered 2021-05-22: 10 mL

## 2021-05-22 MED ORDER — DIPHENHYDRAMINE HCL 25 MG PO CAPS
25.0000 mg | ORAL_CAPSULE | Freq: Once | ORAL | Status: AC
  Administered 2021-05-22: 25 mg via ORAL
  Filled 2021-05-22: qty 1

## 2021-05-22 MED ORDER — SODIUM CHLORIDE 0.9% FLUSH
10.0000 mL | Freq: Once | INTRAVENOUS | Status: AC
  Administered 2021-05-22: 10 mL

## 2021-05-22 MED ORDER — TRASTUZUMAB-DKST CHEMO 150 MG IV SOLR
6.0000 mg/kg | Freq: Once | INTRAVENOUS | Status: AC
  Administered 2021-05-22: 546 mg via INTRAVENOUS
  Filled 2021-05-22: qty 26

## 2021-05-22 MED ORDER — HEPARIN SOD (PORK) LOCK FLUSH 100 UNIT/ML IV SOLN
500.0000 [IU] | Freq: Once | INTRAVENOUS | Status: AC | PRN
  Administered 2021-05-22: 500 [IU]

## 2021-05-22 MED ORDER — ACETAMINOPHEN 325 MG PO TABS
650.0000 mg | ORAL_TABLET | Freq: Once | ORAL | Status: AC
  Administered 2021-05-22: 650 mg via ORAL
  Filled 2021-05-22: qty 2

## 2021-05-22 MED ORDER — SODIUM CHLORIDE 0.9 % IV SOLN
420.0000 mg | Freq: Once | INTRAVENOUS | Status: AC
  Administered 2021-05-22: 420 mg via INTRAVENOUS
  Filled 2021-05-22: qty 14

## 2021-05-22 MED ORDER — SODIUM CHLORIDE 0.9 % IV SOLN: Freq: Once | INTRAVENOUS | Status: DC

## 2021-05-22 NOTE — Patient Instructions (Signed)

## 2021-05-22 NOTE — Patient Instructions (Signed)
[facility]  Discharge Instructions: Thank you for choosing [facility] to provide your oncology and hematology care.   If you have a lab appointment with the [facility], please go directly to the [facility] and check in at the registration area.   Wear comfortable clothing and clothing appropriate for easy access to any Portacath or PICC line.   We strive to give you quality time with your provider. You may need to reschedule your appointment if you arrive late (15 or more minutes).  Arriving late affects you and other patients whose appointments are after yours.  Also, if you miss three or more appointments without notifying the office, you may be dismissed from the clinic at the providers discretion.      For prescription refill requests, have your pharmacy contact our office and allow 72 hours for refills to be completed.    Today you received the following chemotherapy and/or immunotherapy agents: Trastuzumab (Ogivri), Pertuzumab (Perjeta).   To help prevent nausea and vomiting after your treatment, we encourage you to take your nausea medication as directed.  BELOW ARE SYMPTOMS THAT SHOULD BE REPORTED IMMEDIATELY: *FEVER GREATER THAN 100.4 F (38 C) OR HIGHER *CHILLS OR SWEATING *NAUSEA AND VOMITING THAT IS NOT CONTROLLED WITH YOUR NAUSEA MEDICATION *UNUSUAL SHORTNESS OF BREATH *UNUSUAL BRUISING OR BLEEDING *URINARY PROBLEMS (pain or burning when urinating, or frequent urination) *BOWEL PROBLEMS (unusual diarrhea, constipation, pain near the anus) TENDERNESS IN MOUTH AND THROAT WITH OR WITHOUT PRESENCE OF ULCERS (sore throat, sores in mouth, or a toothache) UNUSUAL RASH, SWELLING OR PAIN  UNUSUAL VAGINAL DISCHARGE OR ITCHING   Items with * indicate a potential emergency and should be followed up as soon as possible or go to the Emergency Department if any problems should occur.  Please show the CHEMOTHERAPY ALERT CARD or  IMMUNOTHERAPY ALERT CARD at check-in to the Emergency Department and triage nurse.  Should you have questions after your visit or need to cancel or reschedule your appointment, please contact [facility]  Dept: [phone]  and follow the prompts.  Office hours are 8:00 a.m. to 4:30 p.m. Monday - Friday. Please note that voicemails left after 4:00 p.m. may not be returned until the following business day.  We are closed weekends and major holidays. You have access to a nurse at all times for urgent questions. Please call the main number to the clinic Dept: [phone] and follow the prompts.   For any non-urgent questions, you may also contact your provider using MyChart. We now offer e-Visits for anyone 57 and older to request care online for non-urgent symptoms. For details visit [email or web address].   Also download the MyChart app! Go to the app store, search "MyChart", open the app, select [facility], and log in with your MyChart username and password.  Due to Covid, a mask is required upon entering the hospital/clinic. If you do not have a mask, one will be given to you upon arrival. For doctor visits, patients may have 1 support person aged 67 or older with them. For treatment visits, patients cannot have anyone with them due to current Covid guidelines and our immunocompromised population.

## 2021-05-22 NOTE — Progress Notes (Signed)
Per pt, she had a retinal tear and partial retinal detachment since last infusion. She is being managed by  retinal specialist, pt reports healing process going well, Dr.Gudena notified and ok to proceed.

## 2021-05-30 NOTE — Progress Notes (Signed)
[facility] Note  06/06/2021     CHIEF COMPLAINT Patient presents for Retina Follow Up  HISTORY OF PRESENT ILLNESS: Sandy Goodwin is a 51 y.o. female who presents to the clinic today for:   HPI     Retina Follow Up   Patient presents with  Retinal Break/Detachment.  In right eye.  Severity is moderate.  Duration of 2.5 weeks.  I, the attending physician,  performed the HPI with the patient and updated documentation appropriately.        Comments   Patient states vision the same OU. No new floaters or flashes.      Last edited by Bernarda Caffey, MD on 06/09/2021  1:54 PM.     Pt states no new fol or floaters, VA is stable  Referring physician: Macarthur Critchley, [address].  [place], [place] [address]  HISTORICAL INFORMATION:   Selected notes from the MEDICAL RECORD NUMBER Referred by Dr. Nicki Reaper LEE: 01.23.23 Ocular Hx- HST OD, PVD OD   CURRENT MEDICATIONS: No current outpatient medications on file. (Ophthalmic Drugs)   No current facility-administered medications for this visit. (Ophthalmic Drugs)   Current Outpatient Medications (Other)  Medication Sig   BYSTOLIC 5 MG tablet Take 1 tablet by mouth daily.   cetirizine (ZYRTEC) 10 MG tablet Take 10 mg by mouth daily.   clobetasol ointment (TEMOVATE) 0.05 % Apply topically 2 (two) times daily.   levothyroxine (SYNTHROID, LEVOTHROID) 50 MCG tablet Take 50 mcg by mouth daily before breakfast.   lidocaine-prilocaine (EMLA) cream APPLY TO THE AFFECTED AREA(S) DAILY   LORazepam (ATIVAN) 0.5 MG tablet Take 1 tablet (0.5mg  tablet) at bedtime, as needed for nausea.   metroNIDAZOLE (METROCREAM) 0.75 % cream Apply 1 application topically 2 (two) times daily.   pantoprazole (PROTONIX) 40 MG tablet Take 40 mg by mouth daily.   triamcinolone (NASACORT) 55 MCG/ACT AERO nasal inhaler Place 2 sprays into the nose daily.   valACYclovir (VALTREX) 1000 MG tablet Take 1 tablet (1,000 mg total) by mouth 2  (two) times daily.   famotidine (PEPCID) 10 MG tablet Take 10 mg by mouth 2 (two) times daily. (Patient not taking: Reported on 05/05/2021)   meloxicam (MOBIC) 15 MG tablet Take 15 mg by mouth daily. (Patient not taking: Reported on 05/05/2021)   Turmeric 500 MG CAPS Take by mouth. (Patient not taking: Reported on 05/05/2021)   No current facility-administered medications for this visit. (Other)   REVIEW OF SYSTEMS: ROS   Positive for: Endocrine, Eyes Negative for: Constitutional, Gastrointestinal, Neurological, Skin, Genitourinary, Musculoskeletal, HENT, Cardiovascular, Respiratory, Psychiatric, Allergic/Imm, Heme/Lymph Last edited by Roselee Nova D, COT on 06/06/2021  9:07 AM.     ALLERGIES Allergies  Allergen Reactions   Codeine Other (See Comments)    Makes pt agittated and stay awake    Hydrocodone Other (See Comments)    Makes pt agittated and stay awake    PAST MEDICAL HISTORY Past Medical History:  Diagnosis Date   Arthritis    Breast cancer ([place])    Complication of anesthesia    Itching   Dysrhythmia    Tachycardia   Hypothyroidism    Past Surgical History:  Procedure Laterality Date   ABDOMINAL HYSTERECTOMY     APPENDECTOMY  2008   Bowel Rupture Repair  2008   Ruptured during appendectomy   Breast Biopsy Right 1999   BREAST LUMPECTOMY WITH RADIOACTIVE SEED AND SENTINEL LYMPH NODE BIOPSY Right 12/11/2020   Procedure: RIGHT  BREAST LUMPECTOMY WITH RADIOACTIVE SEED X2 AND RIGHT AXILLARY SENTINEL LYMPH NODE BIOPSY;  Surgeon: Rolm Bookbinder, MD;  Location: [facility];  Service: General;  Laterality: Right;   BREAST RECONSTRUCTION Right 12/20/2020   Procedure: RIGHT ONCOPLASTIC RECONSTRUCTION;  Surgeon: Irene Limbo, MD;  Location: [facility];  Service: Plastics;  Laterality: Right;   BREAST REDUCTION SURGERY Left 12/20/2020   Procedure: LEFT BREAST REDUCTION;  Surgeon: Irene Limbo, MD;  Location: [facility];   Service: Plastics;  Laterality: Left;   COLONOSCOPY N/A 06/15/2016   Procedure: COLONOSCOPY;  Surgeon: Rogene Houston, MD;  [facility];  Service: Endoscopy;  Laterality: N/A;  845   IBF  [id number]   INCISION AND DRAINAGE OF WOUND Right 01/10/2021   Procedure: INCISION AND DRAINAGE OF RIGHT AXILLA;  Surgeon: Irene Limbo, MD;  Location: [facility];  Service: Plastics;  Laterality: Right;   PORTACATH PLACEMENT Left 07/23/2020   Procedure: INSERTION PORT-A-CATH;  Surgeon: Rolm Bookbinder, MD;  Location: [facility];  Service: General;  Laterality: Left;   THYROIDECTOMY, PARTIAL Bilateral 2012   FAMILY HISTORY Family History  Problem Relation Age of Onset   Hypertension Mother    Breast cancer Mother    Lymphoma Mother    Hypertension Brother    Breast cancer Maternal Aunt    Lymphoma Maternal Grandfather    Breast cancer Maternal Aunt    SOCIAL HISTORY Social History   Tobacco Use   Smoking status: Never   Smokeless tobacco: Never  Vaping Use   Vaping Use: Never used  Substance Use Topics   Alcohol use: No   Drug use: No       OPHTHALMIC EXAM:  Base Eye Exam     Visual Acuity (Snellen - Linear)       Right Left   Dist [place] 20/25 -2 20/25   Dist ph  20/20 20/20         Tonometry (Tonopen, 9:13 AM)       Right Left   Pressure 10 14         Pupils       Dark Light Shape React APD   Right 3 2 Round Brisk None   Left 3 2 Round Brisk None         Visual Fields (Counting fingers)       Left Right    Full Full         Extraocular Movement       Right Left    Full, Ortho Full, Ortho         Neuro/Psych     Oriented x3: Yes   Mood/Affect: Normal         Dilation     Both eyes: 1.0% Mydriacyl, 2.5% Phenylephrine @ 9:13 AM           Slit Lamp and Fundus Exam     External Exam       Right Left   External Normal Normal         Slit Lamp Exam       Right Left   Lids/Lashes  dermatochalasis, mild telangiectasias dermatochalasis, mld telangiectasia   Conjunctiva/Sclera White and quiet White and quiet   Cornea 1-2 + inferior PEE, well healed LASIK flap trace PEE, mild tear film debris, well healed LASIK flap   Anterior Chamber Deep and clear Deep and quiet   Iris Round and dilated Round and reactive   Lens 1-2 +  cortical 1-2+ cortical   Anterior Vitreous syneresis, vitreous condensations, debris and old heme settled inferiorly - improving mild syneresis, condensations         Fundus Exam       Right Left   Disc pink and sharp, compact pink and sharp   C/D Ratio 0.2 0.2.   Macula flat, blunted foveal reflex, no heme or edema flat, good foveal reflex, mild RPE mottling, no heme or edema   Vessels mild attenuation, tortuosity Normal   Periphery large HST at 0130 with cuff of SRF surrounding--focal RD--now with good laser changes surrounding - tear now operculated, otherwise attached attached, no heme, No RT/RD           IMAGING AND PROCEDURES  Imaging and Procedures for 06/06/2021  OCT, Retina - OU - Both Eyes       Right Eye Quality was good. Central Foveal Thickness: 273. Progression has been stable. Findings include normal foveal contour, no IRF, subretinal fluid (Retinal tear with SRF, SN periphery, caught on widefield - ?slightly improved, trace vitreous opacities).   Left Eye Quality was good. Central Foveal Thickness: 281. Progression has been stable. Findings include normal foveal contour, no IRF, no SRF, vitreomacular adhesion .   Notes *Images captured and stored on drive  Diagnosis / Impression:  NFP, no IRF/SRF centrally OU OD: Retinal tear with SRF, SN periphery, caught on widefield - ?slightly improved, trace vitreous opacities  Clinical management:  See below  Abbreviations: NFP - Normal foveal profile. CME - cystoid macular edema. PED - pigment epithelial detachment. IRF - intraretinal fluid. SRF - subretinal fluid. EZ - ellipsoid  zone. ERM - epiretinal membrane. ORA - outer retinal atrophy. ORT - outer retinal tubulation. SRHM - subretinal hyper-reflective material. IRHM - intraretinal hyper-reflective material            ASSESSMENT/PLAN:    ICD-10-CM   1. Retinal tear of right eye  H33.311     2. Right retinal detachment  H33.21     3. Posterior vitreous detachment of right eye  H43.811 OCT, Retina - OU - Both Eyes    4. Vitreous hemorrhage of right eye ([place])  H43.11     5. Hx of LASIK  Z98.890     6. Cortical age-related cataract of both eyes  H25.013      1,2. Retinal tear with +SRF / focal RD, OD           - tear located at 0130 w/ blood clot on flap and +SRF surrounding -- focal RD          - s/p laser retinopexy 01.23.23 -- good laser changes surrounding focal RD - flap tear now operculated -- first noted on 02.24.27 visit   - no new RT/RD             - F/u in 3 months  3,4. Hemorrhagic PVD OD  - w/ symptomatic flashes and floaters -- improving  - Discussed findings and prognosis  - RT/ RD as above   - Reviewed s/s of RT/RD  - Strict return precautions for any such RT/RD signs/symptoms  - VH cleared centrally, settling and improving inferiorly  5. Hx of LASIK w/ Dr. Lucita Ferrara  - stable   6. Cortical cataracts OU - The symptoms of cataract, surgical options, and treatments and risks were discussed with patient. - discussed diagnosis and progression - not yet visually significant - monitor for now  Ophthalmic Meds Ordered this visit:  No orders of the  defined types were placed in this encounter.    Return in about 3 months (around 09/03/2021) for f/u 3 months, focal RD OD, DFE, OCT.  There are no Patient Instructions on file for this visit.  Explained the diagnoses, plan, and follow up with the patient and they expressed understanding.  Patient expressed understanding of the importance of proper follow up care.   This document serves as a record of services personally performed  by Gardiner Sleeper, MD, PhD. It was created on their behalf by Leonie Douglas, an ophthalmic technician. The creation of this record is the provider's dictation and/or activities during the visit.    Electronically signed by: Leonie Douglas COA, 06/09/21  1:56 PM  This document serves as a record of services personally performed by Gardiner Sleeper, MD, PhD. It was created on their behalf by San Jetty. Owens Shark, OA an ophthalmic technician. The creation of this record is the provider's dictation and/or activities during the visit.    Electronically signed by: San Jetty. Owens Shark, [place] 02.24.2023 1:56 PM  Gardiner Sleeper, M.D., Ph.D. Diseases & Surgery of the Retina and Vitreous [facility]  I have reviewed the above documentation for accuracy and completeness, and I agree with the above. Gardiner Sleeper, M.D., Ph.D. 06/09/21 1:57 PM   Abbreviations: M myopia (nearsighted); A astigmatism; H hyperopia (farsighted); P presbyopia; Mrx spectacle prescription;  CTL contact lenses; OD right eye; OS left eye; OU both eyes  XT exotropia; ET esotropia; PEK punctate epithelial keratitis; PEE punctate epithelial erosions; DES dry eye syndrome; MGD meibomian gland dysfunction; ATs artificial tears; PFAT's preservative free artificial tears; [place] nuclear sclerotic cataract; PSC posterior subcapsular cataract; ERM epi-retinal membrane; PVD posterior vitreous detachment; RD retinal detachment; DM diabetes mellitus; DR diabetic retinopathy; NPDR non-proliferative diabetic retinopathy; PDR proliferative diabetic retinopathy; CSME clinically significant macular edema; DME diabetic macular edema; dbh dot blot hemorrhages; CWS cotton wool spot; POAG primary open angle glaucoma; C/D cup-to-disc ratio; HVF humphrey visual field; GVF goldmann visual field; OCT optical coherence tomography; IOP intraocular pressure; BRVO Branch retinal vein occlusion; CRVO central retinal vein occlusion; CRAO central retinal artery  occlusion; BRAO branch retinal artery occlusion; RT retinal tear; SB scleral buckle; PPV pars plana vitrectomy; VH Vitreous hemorrhage; PRP panretinal laser photocoagulation; IVK intravitreal kenalog; VMT vitreomacular traction; MH Macular hole;  NVD neovascularization of the disc; NVE neovascularization elsewhere; AREDS age related eye disease study; ARMD age related macular degeneration; POAG primary open angle glaucoma; EBMD epithelial/anterior basement membrane dystrophy; ACIOL anterior chamber intraocular lens; IOL intraocular lens; PCIOL posterior chamber intraocular lens; Phaco/IOL phacoemulsification with intraocular lens placement; [place] photorefractive keratectomy; LASIK laser assisted in situ keratomileusis; HTN hypertension; DM diabetes mellitus; COPD chronic obstructive pulmonary disease

## 2021-06-02 ENCOUNTER — Other Ambulatory Visit: Payer: Self-pay | Admitting: *Deleted

## 2021-06-02 DIAGNOSIS — Z170 Estrogen receptor positive status [ER+]: Secondary | ICD-10-CM

## 2021-06-02 DIAGNOSIS — C50411 Malignant neoplasm of upper-outer quadrant of right female breast: Secondary | ICD-10-CM

## 2021-06-06 ENCOUNTER — Other Ambulatory Visit: Payer: Self-pay

## 2021-06-06 ENCOUNTER — Ambulatory Visit (INDEPENDENT_AMBULATORY_CARE_PROVIDER_SITE_OTHER): Payer: BC Managed Care – PPO | Admitting: Ophthalmology

## 2021-06-06 ENCOUNTER — Encounter (INDEPENDENT_AMBULATORY_CARE_PROVIDER_SITE_OTHER): Payer: Self-pay | Admitting: Ophthalmology

## 2021-06-06 DIAGNOSIS — H4311 Vitreous hemorrhage, right eye: Secondary | ICD-10-CM

## 2021-06-06 DIAGNOSIS — H43811 Vitreous degeneration, right eye: Secondary | ICD-10-CM | POA: Diagnosis not present

## 2021-06-06 DIAGNOSIS — H25013 Cortical age-related cataract, bilateral: Secondary | ICD-10-CM

## 2021-06-06 DIAGNOSIS — Z9889 Other specified postprocedural states: Secondary | ICD-10-CM

## 2021-06-06 DIAGNOSIS — H3321 Serous retinal detachment, right eye: Secondary | ICD-10-CM

## 2021-06-06 DIAGNOSIS — H33311 Horseshoe tear of retina without detachment, right eye: Secondary | ICD-10-CM | POA: Diagnosis not present

## 2021-06-09 ENCOUNTER — Encounter (INDEPENDENT_AMBULATORY_CARE_PROVIDER_SITE_OTHER): Payer: Self-pay | Admitting: Ophthalmology

## 2021-06-11 ENCOUNTER — Other Ambulatory Visit: Payer: Self-pay

## 2021-06-11 DIAGNOSIS — Z170 Estrogen receptor positive status [ER+]: Secondary | ICD-10-CM

## 2021-06-11 DIAGNOSIS — C50411 Malignant neoplasm of upper-outer quadrant of right female breast: Secondary | ICD-10-CM

## 2021-06-12 ENCOUNTER — Inpatient Hospital Stay (HOSPITAL_BASED_OUTPATIENT_CLINIC_OR_DEPARTMENT_OTHER): Payer: BC Managed Care – PPO | Admitting: Adult Health

## 2021-06-12 ENCOUNTER — Inpatient Hospital Stay: Payer: BC Managed Care – PPO

## 2021-06-12 ENCOUNTER — Other Ambulatory Visit: Payer: Self-pay

## 2021-06-12 ENCOUNTER — Inpatient Hospital Stay: Payer: BC Managed Care – PPO | Attending: Hematology and Oncology

## 2021-06-12 ENCOUNTER — Encounter: Payer: Self-pay | Admitting: Adult Health

## 2021-06-12 VITALS — BP 138/70 | HR 63 | Temp 97.7°F | Resp 16 | Ht 66.0 in | Wt 203.3 lb

## 2021-06-12 DIAGNOSIS — E039 Hypothyroidism, unspecified: Secondary | ICD-10-CM | POA: Diagnosis not present

## 2021-06-12 DIAGNOSIS — D6481 Anemia due to antineoplastic chemotherapy: Secondary | ICD-10-CM | POA: Insufficient documentation

## 2021-06-12 DIAGNOSIS — C50411 Malignant neoplasm of upper-outer quadrant of right female breast: Secondary | ICD-10-CM | POA: Diagnosis present

## 2021-06-12 DIAGNOSIS — Z95828 Presence of other vascular implants and grafts: Secondary | ICD-10-CM

## 2021-06-12 DIAGNOSIS — Z5112 Encounter for antineoplastic immunotherapy: Secondary | ICD-10-CM | POA: Diagnosis present

## 2021-06-12 DIAGNOSIS — Z170 Estrogen receptor positive status [ER+]: Secondary | ICD-10-CM

## 2021-06-12 LAB — CBC WITH DIFFERENTIAL (CANCER CENTER ONLY)
Abs Immature Granulocytes: 0.01 10*3/uL (ref 0.00–0.07)
Basophils Absolute: 0 10*3/uL (ref 0.0–0.1)
Basophils Relative: 0 %
Eosinophils Absolute: 0.1 10*3/uL (ref 0.0–0.5)
Eosinophils Relative: 2 %
HCT: 33.8 % — ABNORMAL LOW (ref 36.0–46.0)
Hemoglobin: 10.7 g/dL — ABNORMAL LOW (ref 12.0–15.0)
Immature Granulocytes: 0 %
Lymphocytes Relative: 29 %
Lymphs Abs: 1.5 10*3/uL (ref 0.7–4.0)
MCH: 26.6 pg (ref 26.0–34.0)
MCHC: 31.7 g/dL (ref 30.0–36.0)
MCV: 83.9 fL (ref 80.0–100.0)
Monocytes Absolute: 0.4 10*3/uL (ref 0.1–1.0)
Monocytes Relative: 8 %
Neutro Abs: 3 10*3/uL (ref 1.7–7.7)
Neutrophils Relative %: 61 %
Platelet Count: 254 10*3/uL (ref 150–400)
RBC: 4.03 MIL/uL (ref 3.87–5.11)
RDW: 14.7 % (ref 11.5–15.5)
WBC Count: 4.9 10*3/uL (ref 4.0–10.5)
nRBC: 0 % (ref 0.0–0.2)

## 2021-06-12 LAB — CMP (CANCER CENTER ONLY)
ALT: 19 U/L (ref 0–44)
AST: 18 U/L (ref 15–41)
Albumin: 4 g/dL (ref 3.5–5.0)
Alkaline Phosphatase: 115 U/L (ref 38–126)
Anion gap: 5 (ref 5–15)
BUN: 19 mg/dL (ref 6–20)
CO2: 29 mmol/L (ref 22–32)
Calcium: 9 mg/dL (ref 8.9–10.3)
Chloride: 106 mmol/L (ref 98–111)
Creatinine: 0.77 mg/dL (ref 0.44–1.00)
GFR, Estimated: >60 mL/min (ref >60)
Glucose, Bld: 102 mg/dL — ABNORMAL HIGH (ref 70–99)
Potassium: 4 mmol/L (ref 3.5–5.1)
Sodium: 140 mmol/L (ref 135–145)
Total Bilirubin: 0.4 mg/dL (ref 0.3–1.2)
Total Protein: 7.2 g/dL (ref 6.5–8.1)

## 2021-06-12 MED ORDER — SODIUM CHLORIDE 0.9% FLUSH
10.0000 mL | INTRAVENOUS | Status: DC | PRN
  Administered 2021-06-12: 10 mL

## 2021-06-12 MED ORDER — TRASTUZUMAB-DKST CHEMO 150 MG IV SOLR
6.0000 mg/kg | Freq: Once | INTRAVENOUS | Status: AC
  Administered 2021-06-12: 546 mg via INTRAVENOUS
  Filled 2021-06-12: qty 26

## 2021-06-12 MED ORDER — DIPHENHYDRAMINE HCL 25 MG PO CAPS
25.0000 mg | ORAL_CAPSULE | Freq: Once | ORAL | Status: AC
  Administered 2021-06-12: 25 mg via ORAL
  Filled 2021-06-12: qty 1

## 2021-06-12 MED ORDER — SODIUM CHLORIDE 0.9 % IV SOLN: Freq: Once | INTRAVENOUS | Status: AC

## 2021-06-12 MED ORDER — ANASTROZOLE 1 MG PO TABS: 1.0000 mg | ORAL_TABLET | Freq: Every day | ORAL | 3 refills | Status: DC

## 2021-06-12 MED ORDER — SODIUM CHLORIDE 0.9 % IV SOLN
420.0000 mg | Freq: Once | INTRAVENOUS | Status: AC
  Administered 2021-06-12: 420 mg via INTRAVENOUS
  Filled 2021-06-12: qty 14

## 2021-06-12 MED ORDER — ACETAMINOPHEN 325 MG PO TABS
650.0000 mg | ORAL_TABLET | Freq: Once | ORAL | Status: AC
  Administered 2021-06-12: 650 mg via ORAL
  Filled 2021-06-12: qty 2

## 2021-06-12 MED ORDER — SODIUM CHLORIDE 0.9% FLUSH
10.0000 mL | Freq: Once | INTRAVENOUS | Status: AC
  Administered 2021-06-12: 10 mL

## 2021-06-12 MED ORDER — HEPARIN SOD (PORK) LOCK FLUSH 100 UNIT/ML IV SOLN
500.0000 [IU] | Freq: Once | INTRAVENOUS | Status: AC | PRN
  Administered 2021-06-12: 500 [IU]

## 2021-06-12 NOTE — Assessment & Plan Note (Addendum)
07/08/2020:?Screening mammogram showed right breast calcifications. Diagnostic mammogram showed right breast calcifications spanning 4.1cm and no abnormal right axillary lymph nodes. 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 ?Genetics done in 2019: Negative ?? ?Treatment plan: ?1. Neoadjuvant chemotherapy with [place] Perjeta ?6 cycles?completed 11/15/2020?followed by Herceptin Perjeta maintenance for 1 year ?2.?12/11/2020:Right breast lumpectomy Sandy Goodwin): Scattered foci of high-grade DCIS with calcifications, no invasive cancer, pathologic complete response, margins negative, 0/3 lymph nodes negative, previously ER 80%, PR 40%, HER2 positive, Ki-67 25% ?3. Followed by adjuvant radiation therapy? 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 ?[facility]CC?nausea study ?--------------------------------------------------------------------------------------------------------------------------------------- ?Current treatment: Herceptin and Perjeta maintenance. ?Toxicities: Tolerating it well without any problems. ?Echocardiogram scheduled in March 2023. ? ?Sandy Goodwin continues on treatment and she is tolerating it quite well.  She has 2 more cycles of treatment after today.  We will repeat her echo later this month. ? ?I reviewed her labs with her today and they continue to show a mild anemia.  We discussed her retinal tear and I reviewed with her that with Herceptin/Perteja there is not an increased occurrence of this side effect of retinal tear or detachment noted per up-to-date and an article referenced within their side effects summary which is called, "The ophthalmological complications of targeted agents and cancer therapy: What do we need to know is ophthalmologists?"  And very few patients have they noted increased lacrimation. ? 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  I placed a referral to physical therapy. ? ?She has not yet started on antiestrogen therapy.  Her labs from December indicate that she is postmenopausal.  She has continued to not have any menstrual cycles.  We discussed antiestrogen therapy options and I started anastrozole daily. ? ?Sandy Goodwin will be due for her mammogram in June 2022 which is 6 months after her radiation.  I also ordered bone density.  To be scheduled in June to get a baseline since she is taking anastrozole which can impact bone loss.  She will return and see Dr. Lindi Adie in 3 weeks for labs, follow-up, and Herceptin and Perjeta.  I will see her in May for a virtual survivorship care plan visit. ? ?? ?

## 2021-06-12 NOTE — Progress Notes (Signed)
[facility] Follow up:    Sandy Bis, MD [place] [address]   DIAGNOSIS:  Cancer Staging  Malignant neoplasm of upper-outer quadrant of right breast in female, estrogen receptor positive ([place]) Staging form: Breast, AJCC 8th Edition - Clinical stage from 07/17/2020: Stage IB (cT2, cN0, cM0, G3, ER+, PR+, HER2+) - Unsigned Stage prefix: Initial diagnosis Method of lymph node assessment: Clinical Histologic grading system: 3 grade system - Pathologic stage from 12/11/2020: No Stage Recommended (ypT0, pN0, cM0) - Signed by Gardenia Phlegm, NP on 03/19/2021 Stage prefix: Post-therapy   SUMMARY OF ONCOLOGIC HISTORY: Oncology History  Malignant neoplasm of upper-outer quadrant of right breast in female, estrogen receptor positive ([place])  07/08/2020 Initial Diagnosis   Screening mammogram showed right breast calcifications. Diagnostic mammogram showed right breast calcifications spanning 4.1cm and no abnormal right axillary lymph nodes. 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   07/24/2020 - 11/15/2020 Neo-Adjuvant Chemotherapy   Taxotere, Carbo, Herceptin, Perjeta given every three weeks x 6   12/05/2020 -  Chemotherapy   Maintenance Herceptin/Perjeta every three weeks for one year       12/11/2020 Surgery   Right breast lumpectomy [name]): Scattered foci of high-grade DCIS with calcifications, no invasive cancer, pathologic complete response, margins negative, 0/3 lymph nodes negative, previously ER 80%, PR 40%, HER2 positive, Ki-67 25%   12/11/2020 Cancer Staging   Staging form: Breast, AJCC 8th Edition - Pathologic stage from 12/11/2020: No Stage Recommended (ypT0, pN0, cM0) - Signed by Gardenia Phlegm, NP on 03/19/2021 Stage prefix: Post-therapy    12/20/2020 Surgery   Reconstruction with Dr. Iran Planas   02/04/2021 - 03/25/2021 Radiation Therapy    Adjuvant radiation     CURRENT THERAPY: Herceptin/Perjeta  INTERVAL HISTORY: Sandy Goodwin 51 y.o. female returns for   She notes that since her last visit she has experienced a retinal tear and partial detachment.   This required ocular surgery.  She has since recovered.  She notes with her age and the fact that she is nearsighted those were both risk factors for her having a detachment however she is unsure if there were any ocular side effects with the Herceptin.  She is back at work in person full-time.  She is concerned that she is experiencing breast lymphedema.  She called physical therapy and her next appointment is in 1 week.  She says her breast feels swollen and enlarged.  Her most recent echo was completed on 03/24/2022 and showed an EF of 55-60%.  Her next echo is scheduled 06/23/2021.    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  MEDICAL HISTORY: Past Medical History:  Diagnosis Date   Arthritis    Breast cancer ([place])    Complication of anesthesia    Itching   Dysrhythmia    Tachycardia   Hypothyroidism     SURGICAL HISTORY: Past Surgical History:  Procedure Laterality Date   ABDOMINAL HYSTERECTOMY  APPENDECTOMY  2008   Bowel Rupture Repair  2008   Ruptured during appendectomy   Breast Biopsy Right 1999   BREAST LUMPECTOMY WITH RADIOACTIVE SEED AND SENTINEL LYMPH NODE BIOPSY Right 12/11/2020   Procedure: RIGHT BREAST LUMPECTOMY WITH RADIOACTIVE SEED X2 AND RIGHT AXILLARY SENTINEL LYMPH NODE  BIOPSY;  Surgeon: Rolm Bookbinder, MD;  Location: [facility];  Service: General;  Laterality: Right;   BREAST RECONSTRUCTION Right 12/20/2020   Procedure: RIGHT ONCOPLASTIC RECONSTRUCTION;  Surgeon: Irene Limbo, MD;  Location: [facility];  Service: Plastics;  Laterality: Right;   BREAST REDUCTION SURGERY Left 12/20/2020   Procedure: LEFT BREAST REDUCTION;  Surgeon: Irene Limbo, MD;  Location: [facility];  Service: Plastics;  Laterality: Left;   COLONOSCOPY N/A 06/15/2016   Procedure: COLONOSCOPY;  Surgeon: Rogene Houston, MD;  [facility];  Service: Endoscopy;  Laterality: N/A;  845   IBF  [id number]   INCISION AND DRAINAGE OF WOUND Right 01/10/2021   Procedure: INCISION AND DRAINAGE OF RIGHT AXILLA;  Surgeon: Irene Limbo, MD;  Location: [facility];  Service: Plastics;  Laterality: Right;   PORTACATH PLACEMENT Left 07/23/2020   Procedure: INSERTION PORT-A-CATH;  Surgeon: Rolm Bookbinder, MD;  Location: [facility];  Service: General;  Laterality: Left;   THYROIDECTOMY, PARTIAL Bilateral 2012    SOCIAL HISTORY: Social History   Socioeconomic History   Marital status: Married    Spouse name: Not on file   Number of children: Not on file   Years of education: Not on file   Highest education level: Not on file  Occupational History   Not on file  Tobacco Use   Smoking status: Never   Smokeless tobacco: Never  Vaping Use   Vaping Use: Never used  Substance and Sexual Activity   Alcohol use: No   Drug use: No   Sexual activity: Yes  Other Topics Concern   Not on file  Social History Narrative   Not on file   Social Determinants of Health   Financial Resource Strain: Not on file  Food Insecurity: Not on file  Transportation Needs: Not on file  Physical Activity: Not on file  Stress: Not on file  Social Connections: Not on file  Intimate Partner Violence: Not on file     FAMILY HISTORY: Family History  Problem Relation Age of Onset   Hypertension Mother    Breast cancer Mother    Lymphoma Mother    Hypertension Brother    Breast cancer Maternal Aunt    Lymphoma Maternal Grandfather    Breast cancer Maternal Aunt     Review of Systems  Constitutional:  Negative for appetite change, chills, fatigue, fever and unexpected weight change.  HENT:   Negative for hearing loss, lump/mass and trouble swallowing.   Eyes:  Negative for eye problems and icterus.  Respiratory:  Negative for chest tightness, cough and shortness of breath.   Cardiovascular:  Negative for chest pain, leg swelling and palpitations.  Gastrointestinal:  Negative for abdominal distention, abdominal pain, constipation, diarrhea, nausea and vomiting.  Endocrine: Negative for hot flashes.  Genitourinary:  Negative for difficulty urinating.   Musculoskeletal:  Negative for arthralgias.  Skin:  Negative for itching and rash.  Neurological:  Negative for dizziness, extremity weakness, headaches and numbness.  Hematological:  Negative for adenopathy. Does not bruise/bleed easily.  Psychiatric/Behavioral:  Negative for depression. The patient is not nervous/anxious.      PHYSICAL EXAMINATION  ECOG PERFORMANCE STATUS: 1 - Symptomatic but completely ambulatory  Vitals:   06/12/21 0916  BP: 138/70  Pulse: 63  Resp: 16  Temp: 97.7 F (36.5 C)  SpO2: 100%    Physical Exam Constitutional:      General: She is not in acute distress.    Appearance: Normal appearance. She is not toxic-appearing.  HENT:     Head: Normocephalic and atraumatic.  Eyes:     General: No scleral icterus. Cardiovascular:     Rate and Rhythm: Normal rate and regular rhythm.     Pulses: Normal pulses.     Heart sounds: Normal heart sounds.  Pulmonary:     Effort: Pulmonary effort is normal.     Breath sounds: Normal breath sounds.  Chest:     Comments: Right breast is swollen.  There is no sign of  local recurrence and there is also no sign of infection. Abdominal:     General: Abdomen is flat. Bowel sounds are normal. There is no distension.     Palpations: Abdomen is soft.     Tenderness: There is no abdominal tenderness.  Musculoskeletal:        General: No swelling.     Cervical back: Neck supple.  Lymphadenopathy:     Cervical: No cervical adenopathy.  Skin:    General: Skin is warm and dry.     Findings: No rash.  Neurological:     General: No focal deficit present.     Mental Status: She is alert.  Psychiatric:        Mood and Affect: Mood normal.        Behavior: Behavior normal.    LABORATORY DATA:  CBC    Component Value Date/Time   WBC 4.9 06/12/2021 0901   WBC 7.9 07/31/2020 1315   RBC 4.03 06/12/2021 0901   HGB 10.7 (L) 06/12/2021 0901   HCT 33.8 (L) 06/12/2021 0901   PLT 254 06/12/2021 0901   MCV 83.9 06/12/2021 0901   MCH 26.6 06/12/2021 0901   MCHC 31.7 06/12/2021 0901   RDW 14.7 06/12/2021 0901   LYMPHSABS 1.5 06/12/2021 0901   MONOABS 0.4 06/12/2021 0901   EOSABS 0.1 06/12/2021 0901   BASOSABS 0.0 06/12/2021 0901    CMP     Component Value Date/Time   NA 140 06/12/2021 0901   K 4.0 06/12/2021 0901   CL 106 06/12/2021 0901   CO2 29 06/12/2021 0901   GLUCOSE 102 (H) 06/12/2021 0901   BUN 19 06/12/2021 0901   CREATININE 0.77 06/12/2021 0901   CALCIUM 9.0 06/12/2021 0901   PROT 7.2 06/12/2021 0901   ALBUMIN 4.0 06/12/2021 0901   AST 18 06/12/2021 0901   ALT 19 06/12/2021 0901   ALKPHOS 115 06/12/2021 0901   BILITOT 0.4 06/12/2021 0901   GFRNONAA >60 06/12/2021 0901      ASSESSMENT and THERAPY PLAN:   Malignant neoplasm of upper-outer quadrant of right breast in female, estrogen receptor positive ([place]) 07/08/2020: Screening mammogram showed right breast calcifications. Diagnostic mammogram showed right breast calcifications spanning 4.1cm and no abnormal right axillary lymph nodes. Biopsy showed invasive ductal carcinoma, grade 3,  with high grade DCIS, HER-2 positive (3+), ER+ 80%, PR+ 40%, Ki67 25%. Genetics done in 2019: Negative   Treatment plan: 1. Neoadjuvant chemotherapy with TCH Perjeta 6 cycles completed 11/15/2020 followed by Herceptin Perjeta maintenance for 1 year 2. 12/11/2020:Right breast lumpectomy [name]): Scattered foci of high-grade DCIS with calcifications, no invasive cancer, pathologic complete response, margins  negative, 0/3 lymph nodes negative, previously ER 80%, PR 40%, HER2 positive, Ki-67 25% 3. Followed by adjuvant radiation therapy  started 02/04/2021 4.  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 URCC nausea study --------------------------------------------------------------------------------------------------------------------------------------- Current treatment: Herceptin and Perjeta maintenance. Toxicities: Tolerating it well without any problems. Echocardiogram scheduled in March 2023.  Sandy Goodwin continues on treatment and she is tolerating it quite well.  She has 2 more cycles of treatment after today.  We will repeat her echo later this month.  I reviewed her labs with her today and they continue to show a mild anemia.  We discussed her retinal tear and I reviewed with her that with Herceptin/Perteja there is not an increased occurrence of this side effect of retinal tear or detachment noted per up-to-date and an article referenced within their side effects summary which is called, "The ophthalmological complications of targeted agents and cancer therapy: What do we need to know is ophthalmologists?"  And very few patients have they noted increased lacrimation.  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  I placed a referral to physical therapy.  She has not yet started on antiestrogen therapy.  Her labs from December indicate that she is postmenopausal.  She has continued to not have any  menstrual cycles.  We discussed antiestrogen therapy options and I started anastrozole daily.  Lakoda will be due for her mammogram in June 2022 which is 6 months after her radiation.  I also ordered bone density.  To be scheduled in June to get a baseline since she is taking anastrozole which can impact bone loss.  She will return and see Dr. Lindi Adie in 3 weeks for labs, follow-up, and Herceptin and Perjeta.  I will see her in May for a virtual survivorship care plan visit.      All questions were answered. The patient knows to call the clinic with any problems, questions or concerns. We can certainly see the patient much sooner if necessary.  Total encounter time: 40 minutes in face to face visit time, chart review, lab review, care coordination,   Wilber Bihari, NP 06/12/21 10:08 AM Medical Oncology and Hematology [facility] [place], [place] [address] Tel. [phone]    Fax. [phone]  *Total Encounter Time as defined by the Centers for Medicare and Medicaid Services includes, in addition to the face-to-face time of a patient visit (documented in the note above) non-face-to-face time: obtaining and reviewing outside history, ordering and reviewing medications, tests or procedures, care coordination (communications with other health care professionals or caregivers) and documentation in the medical record.

## 2021-06-12 NOTE — Patient Instructions (Signed)
[facility]  Discharge Instructions: ?Thank you for choosing [facility] to provide your oncology and hematology care.  ? ?If you have a lab appointment with the [facility], please go directly to the [facility] and check in at the registration area. ?  ?Wear comfortable clothing and clothing appropriate for easy access to any Portacath or PICC line.  ? ?We strive to give you quality time with your provider. You may need to reschedule your appointment if you arrive late (15 or more minutes).  Arriving late affects you and other patients whose appointments are after yours.  Also, if you miss three or more appointments without notifying the office, you may be dismissed from the clinic at the provider?s discretion.    ?  ?For prescription refill requests, have your pharmacy contact our office and allow 72 hours for refills to be completed.   ? ?Today you received the following chemotherapy and/or immunotherapy agents: Trastuzumab (Ogivri), Pertuzumab (Perjeta). ?  ?To help prevent nausea and vomiting after your treatment, we encourage you to take your nausea medication as directed. ? ?BELOW ARE SYMPTOMS THAT SHOULD BE REPORTED IMMEDIATELY: ?*FEVER GREATER THAN 100.4 F (38 ?C) OR HIGHER ?*CHILLS OR SWEATING ?*NAUSEA AND VOMITING THAT IS NOT CONTROLLED WITH YOUR NAUSEA MEDICATION ?*UNUSUAL SHORTNESS OF BREATH ?*UNUSUAL BRUISING OR BLEEDING ?*URINARY PROBLEMS (pain or burning when urinating, or frequent urination) ?*BOWEL PROBLEMS (unusual diarrhea, constipation, pain near the anus) ?TENDERNESS IN MOUTH AND THROAT WITH OR WITHOUT PRESENCE OF ULCERS (sore throat, sores in mouth, or a toothache) ?UNUSUAL RASH, SWELLING OR PAIN  ?UNUSUAL VAGINAL DISCHARGE OR ITCHING  ? ?Items with * indicate a potential emergency and should be followed up as soon as possible or go to the Emergency Department if any problems should occur. ? ?Please show the CHEMOTHERAPY ALERT CARD or  IMMUNOTHERAPY ALERT CARD at check-in to the Emergency Department and triage nurse. ? ?Should you have questions after your visit or need to cancel or reschedule your appointment, please contact [facility]  Dept: [phone]  and follow the prompts.  Office hours are 8:00 a.m. to 4:30 p.m. Monday - Friday. Please note that voicemails left after 4:00 p.m. may not be returned until the following business day.  We are closed weekends and major holidays. You have access to a nurse at all times for urgent questions. Please call the main number to the clinic Dept: [phone] and follow the prompts. ? ? ?For any non-urgent questions, you may also contact your provider using MyChart. We now offer e-Visits for anyone 75 and older to request care online for non-urgent symptoms. For details visit [email or web address]. ?  ?Also download the MyChart app! Go to the app store, search "MyChart", open the app, select [facility], and log in with your MyChart username and password. ? ?Due to Covid, a mask is required upon entering the hospital/clinic. If you do not have a mask, one will be given to you upon arrival. For doctor visits, patients may have 1 support person aged 45 or older with them. For treatment visits, patients cannot have anyone with them due to current Covid guidelines and our immunocompromised population.  ? ?

## 2021-06-13 ENCOUNTER — Telehealth: Payer: Self-pay | Admitting: Adult Health

## 2021-06-13 NOTE — Telephone Encounter (Signed)
Scheduled appointment per 3/2 los. Patient is aware of upcoming appointment. ?

## 2021-06-19 ENCOUNTER — Other Ambulatory Visit: Payer: Self-pay

## 2021-06-19 ENCOUNTER — Ambulatory Visit: Payer: BC Managed Care – PPO | Attending: Hematology and Oncology | Admitting: Rehabilitation

## 2021-06-19 ENCOUNTER — Encounter: Payer: Self-pay | Admitting: Rehabilitation

## 2021-06-19 DIAGNOSIS — Z170 Estrogen receptor positive status [ER+]: Secondary | ICD-10-CM | POA: Diagnosis present

## 2021-06-19 DIAGNOSIS — C50411 Malignant neoplasm of upper-outer quadrant of right female breast: Secondary | ICD-10-CM | POA: Diagnosis present

## 2021-06-19 DIAGNOSIS — I890 Lymphedema, not elsewhere classified: Secondary | ICD-10-CM

## 2021-06-19 DIAGNOSIS — R293 Abnormal posture: Secondary | ICD-10-CM | POA: Diagnosis present

## 2021-06-19 DIAGNOSIS — Z483 Aftercare following surgery for neoplasm: Secondary | ICD-10-CM | POA: Diagnosis not present

## 2021-06-19 NOTE — Addendum Note (Signed)
Addended by: Shan Levans R on: 06/19/2021 10:56 AM ? ? Modules accepted: Orders ? ?

## 2021-06-19 NOTE — Therapy (Signed)
?OUTPATIENT PHYSICAL THERAPY ONCOLOGY EVALUATION ? ?Patient Name: Sandy Goodwin ?MRN: [id number] ?DOB:09/14/1970, 51 y.o., female ?Today's Date: 06/19/2021 ? ? PT End of Session - 06/19/21 1051   ? ? Visit Number 3   ? Number of Visits 9   ? Date for PT Re-Evaluation 07/31/21   ? PT Start Time 1000   ? PT Stop Time 1045   ? PT Time Calculation (min) 45 min   ? Activity Tolerance Patient tolerated treatment well   ? Behavior During Therapy WFL for tasks assessed/performed   ? ?  ?  ? ?  ? ? ?Past Medical History:  ?Diagnosis Date  ? Arthritis   ? Breast cancer (HCC)   ? Complication of anesthesia   ? Itching  ? Dysrhythmia   ? Tachycardia  ? Hypothyroidism   ? ?Past Surgical History:  ?Procedure Laterality Date  ? ABDOMINAL HYSTERECTOMY    ? APPENDECTOMY  2008  ? Bowel Rupture Repair  2008  ? Ruptured during appendectomy  ? Breast Biopsy Right 1999  ? BREAST LUMPECTOMY WITH RADIOACTIVE SEED AND SENTINEL LYMPH NODE BIOPSY Right 12/11/2020  ? Procedure: RIGHT BREAST LUMPECTOMY WITH RADIOACTIVE SEED X2 AND RIGHT AXILLARY SENTINEL LYMPH NODE BIOPSY;  Surgeon: Wakefield, Matthew, MD;  Location: [facility];  Service: General;  Laterality: Right;  ? BREAST RECONSTRUCTION Right 12/20/2020  ? Procedure: RIGHT ONCOPLASTIC RECONSTRUCTION;  Surgeon: Thimmappa, Brinda, MD;  Location: [facility];  Service: Plastics;  Laterality: Right;  ? BREAST REDUCTION SURGERY Left 12/20/2020  ? Procedure: LEFT BREAST REDUCTION;  Surgeon: Thimmappa, Brinda, MD;  Location: [facility];  Service: Plastics;  Laterality: Left;  ? COLONOSCOPY N/A 06/15/2016  ? Procedure: COLONOSCOPY;  Surgeon: Najeeb U Rehman, MD;  [facility];  Service: Endoscopy;  Laterality: N/A;  845  ? IBF  2010,2012  ? INCISION AND DRAINAGE OF WOUND Right 01/10/2021  ? Procedure: INCISION AND DRAINAGE OF RIGHT AXILLA;  Surgeon: Thimmappa, Brinda, MD;  Location: [facility];  Service: Plastics;  Laterality: Right;  ?  PORTACATH PLACEMENT Left 07/23/2020  ? Procedure: INSERTION PORT-A-CATH;  Surgeon: Wakefield, Matthew, MD;  Location: [facility];  Service: General;  Laterality: Left;  ? THYROIDECTOMY, PARTIAL Bilateral 2012  ? ?Patient Active Problem List  ? Diagnosis Date Noted  ? Uterine prolapse 03/20/2021  ? Polyarthralgia 03/20/2021  ? Port-A-Cath in place 02/06/2021  ? Genetic testing 07/17/2020  ? Malignant neoplasm of upper-outer quadrant of right breast in female, estrogen receptor positive (HCC) 07/12/2020  ? Change in bowel habits 06/12/2016  ? Mixed hyperlipidemia 01/07/2016  ? Hypothyroidism, unspecified 01/07/2016  ? Allergic rhinitis 04/02/2015  ? 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 ? ?PCP: Daniel, Terry G, MD ? ?REFERRING PROVIDER: Gudena, Vinay, MD ? ?REFERRING DIAG: Rt breast lymphedema ? ?THERAPY DIAG:  ?Aftercare following surgery for neoplasm ? ?Abnormal posture ? ?Malignant neoplasm of upper-outer quadrant of right breast in female, estrogen receptor positive (HCC) ? ?Lymphedema, not elsewhere classified ? ?ONSET DATE: 03/25/21 ? ?SUBJECTIVE                                                                                                                                                                                          ? ?  SUBJECTIVE STATEMENT: ?I started to notice some swelling in February.  I ended radiation December.  It is full and heavy and intermittent pain shooting.   ? 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 There are 4.1 cm of calcifications.with a Ki67 of 25%; pt complete chemo in Aug 2022, 12/11/20 - R breast lumpectomy and SLNB (0/3), 12/20/20- oncoplasty on R and breast reduction on the L, 9/30 pt had to have another surgery to drain the R axilla which had an absess, radiation completed ? ?PAIN:  ?Are you having pain? No ?Intermittent shooting pains in the breast and more pain by the  end of the day  ? ?PRECAUTIONS: Other: lymphedema risk Rt UE ? ?WEIGHT BEARING RESTRICTIONS No ? ?FALLS:  ?Has patient fallen in last 6 months? No ? ?LIVING ENVIRONMENT: ?Lives with: lives with their spouse and children 12 and 7 daughters ? 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 ?LEISURE: still anemic - working on the walking  ? ?HAND DOMINANCE : right  ? ?PRIOR LEVEL OF FUNCTION: Independent ? ?PATIENT GOALS improve the breast discomfort  ? ? ?OBJECTIVE ? ?COGNITION: ? Overall cognitive status: Within functional limits for tasks assessed  ? ?PALPATION: No pitting. General soft edema throughout the breast.  No enlarged pores ? ?OBSERVATIONS / OTHER ASSESSMENTS: Rt breast retracted due to radiation but slightly larger due to edema. Well healed T incision bilaterally due to reduction as well .   ? ?Breast edema questionnaire:  28  ?Score of more than 9 equates to breast lymphedema ? ? ?POSTURE: forward shoulders ? ?UPPER EXTREMITY AROM/PROM: WNL ? ?CERVICAL AROM: ?All within normal limits:  ? ?UPPER EXTREMITY STRENGTH: WNL ? ?L-DEX LYMPHEDEMA SCREENING: Pt already participating in screens ? ?TODAY'S TREATMENT  ?06/19/21 ?Education on compression bras given info and prescription from Dr. Gudena ?Education on lymphatic anatomy of the breast and reasons for MLD and compression with education on self MLD per handout below ?Performed seated in front of the mirror: With patient permission: supraclavicular nodes, 5 diaphragmatic breaths, bil axillary nodes and establishment of interaxillary pathway, R inguinal nodes and establishment of axilloinguinal pathway, then R breast moving fluid towards pathways.  ? ?PATIENT EDUCATION:  ?Education details: self MLD, compression bra, POC ?Person educated: Patient ?Education method: Explanation, Demonstration, Tactile cues, Verbal cues, and Handouts ?Education comprehension: verbalized understanding, returned demonstration, verbal cues required, tactile cues  required, and needs further education ? ? ?HOME EXERCISE PROGRAM: ?Self MLD ? ?ASSESSMENT: ? ?CLINICAL IMPRESSION: ?Patient is a 50 y.o. female who was seen today for physical therapy evaluation and treatment for her Rt breast lymphedema. Breast at this time is more soft general edema but pt reports more firmness at the end of the day.  Pt is not yet wearing compression so this will help tremendously and self MLD education was started today.  Pt scored 28/80 on the breast edema questionnaire which has a cut off of edema noted at 9/80.  Pt will attend sessions 1x per week x up to 6 weeks for breast MLD and education on self MLD to decrease breast heaviness and discomfort.   ? ? ?OBJECTIVE IMPAIRMENTS decreased knowledge of use of DME and increased edema.  ? ?ACTIVITY LIMITATIONS  none .  ? ?PERSONAL FACTORS  radiation hx, SLNB  are also affecting patient's functional outcome.  ? ? ?REHAB POTENTIAL: Excellent ? ?CLINICAL DECISION MAKING: Stable/uncomplicated ? ?EVALUATION COMPLEXITY: Low ? ?  GOALS: ?Goals reviewed with patient? Yes ? ?LONG TERM GOALS: ? ?Pt will be independent in self MLD for the Rt breast ?Baseline:  ?Target date: 07/31/2021 ?Goal status: INITIAL ? ?2.  Pt will obtain appropriate compression bra for edema management ?Baseline:  ?Target date: 07/31/2021 ?Goal status: INITIAL ? ?3.  Pt will decrease breast edema questionnaire to 18 or less to demonstrate improvement in edema severity ?Baseline:   ?Target date: 07/31/2021 ?Goal status: INITIAL ? ? ?PLAN: ?PT FREQUENCY: 1-2x/week ? ?PT DURATION: 6 weeks ? ?PLANNED INTERVENTIONS: Therapeutic exercises, Patient/Family education, Manual lymph drainage, Taping, and Manual therapy ? ?PLAN FOR NEXT SESSION: * DO SOZO 3/24 or 3/31 appt and schedule out* get bra? How was self MLD? Perform Rt breast MLD with review of steps as needed ? ? ?Tevis, Kara R, PT ?06/19/2021, 10:52 AM ? ?

## 2021-06-23 ENCOUNTER — Ambulatory Visit (HOSPITAL_COMMUNITY)
Admission: RE | Admit: 2021-06-23 | Discharge: 2021-06-23 | Disposition: A | Discharge status: Home / Self Care | Payer: BC Managed Care – PPO | Source: Ambulatory Visit | Attending: Hematology and Oncology | Admitting: Hematology and Oncology

## 2021-06-23 ENCOUNTER — Other Ambulatory Visit: Payer: Self-pay

## 2021-06-23 DIAGNOSIS — Z170 Estrogen receptor positive status [ER+]: Secondary | ICD-10-CM | POA: Insufficient documentation

## 2021-06-23 DIAGNOSIS — Z5111 Encounter for antineoplastic chemotherapy: Secondary | ICD-10-CM | POA: Insufficient documentation

## 2021-06-23 DIAGNOSIS — C50411 Malignant neoplasm of upper-outer quadrant of right female breast: Secondary | ICD-10-CM | POA: Insufficient documentation

## 2021-06-23 DIAGNOSIS — Z0189 Encounter for other specified special examinations: Secondary | ICD-10-CM

## 2021-06-23 LAB — ECHOCARDIOGRAM COMPLETE
AR max vel: 2.43 cm2
AV Peak grad: 10 mmHg
Ao pk vel: 1.58 m/s
Area-P 1/2: 3.89 cm2
Calc EF: 58.5 %
S' Lateral: 3.6 cm
Single Plane A2C EF: 59.5 %
Single Plane A4C EF: 58.9 %

## 2021-06-27 ENCOUNTER — Ambulatory Visit: Payer: BC Managed Care – PPO | Admitting: Rehabilitation

## 2021-07-02 ENCOUNTER — Other Ambulatory Visit: Payer: Self-pay | Admitting: *Deleted

## 2021-07-02 DIAGNOSIS — Z170 Estrogen receptor positive status [ER+]: Secondary | ICD-10-CM

## 2021-07-02 NOTE — Progress Notes (Signed)
? ?Patient Care Team: ?Caryl Bis, MD as PCP - General (Family Medicine) ?Mauro Kaufmann, RN as Oncology Nurse Navigator ?Rockwell Germany, RN as Oncology Nurse Navigator ?Rolm Bookbinder, MD as Consulting Physician (General Surgery) ?Nicholas Lose, MD as Consulting Physician (Hematology and Oncology) ?Kyung Rudd, MD as Consulting Physician (Radiation Oncology) ? ?DIAGNOSIS:  ?Encounter Diagnosis  ?Name Primary?  ? Malignant neoplasm of upper-outer quadrant of right breast in female, estrogen receptor positive ([place]) Yes  ? ? ?SUMMARY OF ONCOLOGIC HISTORY: ?Oncology History  ?Malignant neoplasm of upper-outer quadrant of right breast in female, estrogen receptor positive ([place])  ?07/08/2020 Initial Diagnosis  ? Screening mammogram showed right breast calcifications. Diagnostic mammogram showed right breast calcifications spanning 4.1cm and no abnormal right axillary lymph nodes. 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 ?  ?07/24/2020 - 11/15/2020 Neo-Adjuvant Chemotherapy  ? Taxotere, Carbo, Herceptin, Perjeta given every three weeks x 6 ?  ?12/05/2020 -  Chemotherapy  ? Maintenance Herceptin/Perjeta every three weeks for one year ? ?  ? ?  ?12/11/2020 Surgery  ? Right breast lumpectomy [name]): Scattered foci of high-grade DCIS with calcifications, no invasive cancer, pathologic complete response, margins negative, 0/3 lymph nodes negative, previously ER 80%, PR 40%, HER2 positive, Ki-67 25% ?  ?12/11/2020 Cancer Staging  ? Staging form: Breast, AJCC 8th Edition ?- Pathologic stage from 12/11/2020: No Stage Recommended (ypT0, pN0, cM0) - Signed by Gardenia Phlegm, NP on 03/19/2021 ?Stage prefix: Post-therapy ? ?  ?12/20/2020 Surgery  ? Reconstruction with Dr. Iran Planas ?  ?02/04/2021 - 03/25/2021 Radiation Therapy  ? Adjuvant radiation ?  ? ? ?CHIEF COMPLIANT:  Herceptin and Perjeta maintenance ? ?INTERVAL HISTORY: Sandy Goodwin  is a 51 y.o. with above-mentioned history of breast cancer currently on chemotherapy with Herceptin and Perjeta. She presents to the clinic today for maintenance Herceptin and Perjeta.  ?She states that she is tolerating the the Herceptin.  ?She gets intermittent diarrhea and complains of fatigue with exertion.  She feels short of breath when climbing up a slope. ?She is tolerating anastrozole reasonably well.  Occasional hot flashes. ? ?ALLERGIES:  is allergic to codeine and hydrocodone. ? ?MEDICATIONS:  ?Current Outpatient Medications  ?Medication Sig Dispense Refill  ? anastrozole (ARIMIDEX) 1 MG tablet Take 1 tablet (1 mg total) by mouth daily. 90 tablet 3  ? BYSTOLIC 5 MG tablet Take 1 tablet by mouth daily.  1  ? cetirizine (ZYRTEC) 10 MG tablet Take 10 mg by mouth daily.    ? clobetasol ointment (TEMOVATE) 0.05 % Apply topically 2 (two) times daily.    ? levothyroxine (SYNTHROID, LEVOTHROID) 50 MCG tablet Take 50 mcg by mouth daily before breakfast.    ? lidocaine-prilocaine (EMLA) cream APPLY TO THE AFFECTED AREA(S) DAILY 30 g 0  ? LORazepam (ATIVAN) 0.5 MG tablet Take 1 tablet (0.75m tablet) at bedtime, as needed for nausea. 30 tablet 0  ? metroNIDAZOLE (METROCREAM) 0.75 % cream Apply 1 application topically 2 (two) times daily.    ? pantoprazole (PROTONIX) 40 MG tablet Take 40 mg by mouth daily.    ? triamcinolone (NASACORT) 55 MCG/ACT AERO nasal inhaler Place 2 sprays into the nose daily.    ? Turmeric 500 MG CAPS Take by mouth. (Patient not taking: Reported on 05/05/2021)    ? valACYclovir (VALTREX) 1000 MG tablet Take 1 tablet (1,000 mg total) by mouth 2 (two) times daily. 14 tablet 2  ? ?  No current facility-administered medications for this visit.  ? ? ?PHYSICAL EXAMINATION: ?ECOG PERFORMANCE STATUS: 1 - Symptomatic but completely ambulatory ? ?Vitals:  ? 07/03/21 0920  ?BP: 136/60  ?Pulse: 68  ?Resp: 18  ?Temp: (!) 97.3 ?F (36.3 ?C)  ?SpO2: 99%  ? ?Filed Weights  ? 07/03/21 0920  ?Weight: 203 lb 9.6 oz  (92.4 kg)  ? ? ?  ? ?LABORATORY DATA:  ?I have reviewed the data as listed ? ?  Latest Ref Rng & Units 06/12/2021  ?  9:01 AM 05/22/2021  ?  8:48 AM 05/01/2021  ?  9:04 AM  ?CMP  ?Glucose 70 - 99 mg/dL 102   106   100    ?BUN 6 - 20 mg/dL _0 ?Creatinine 0.44 - 1.00 mg/dL 0.77   0.71   0.77    ?Sodium 135 - 145 mmol/L 140   140   141    ?Potassium 3.5 - 5.1 mmol/L 4.0   4.0   3.7    ?Chloride 98 - 111 mmol/L 106   105   106    ?CO2 22 - 32 mmol/L _1 ?Calcium 8.9 - 10.3 mg/dL 9.0   8.9   9.0    ?Total Protein 6.5 - 8.1 g/dL 7.2   7.2   7.1    ?Total Bilirubin 0.3 - 1.2 mg/dL 0.4   0.4   0.4    ?Alkaline Phos 38 - 126 U/L 115   118   106    ?AST 15 - 41 U/L _2 ?ALT 0 - 44 U/L _3 ? ? ?Lab Results  ?Component Value Date  ? WBC 4.6 07/03/2021  ? HGB 10.8 (L) 07/03/2021  ? HCT 33.1 (L) 07/03/2021  ? MCV 83.6 07/03/2021  ? PLT 244 07/03/2021  ? NEUTROABS 2.5 07/03/2021  ? ? ?ASSESSMENT & PLAN:  ?Malignant neoplasm of upper-outer quadrant of right breast in female, estrogen receptor positive ([place]) ?07/08/2020: Screening mammogram showed right breast calcifications. Diagnostic mammogram showed right breast calcifications spanning 4.1cm and no abnormal right axillary lymph nodes. 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 ?Genetics done in 2019: Negative ?  ?Treatment plan: ?1. Neoadjuvant chemotherapy with [place] Perjeta ?6 cycles completed 11/15/2020 followed by Herceptin Perjeta maintenance for 1 year ?2. 12/11/2020:Right breast lumpectomy [name]): Scattered foci of high-grade DCIS with calcifications, no invasive cancer, pathologic complete response, margins negative, 0/3 lymph nodes negative, previously ER 80%, PR 40%, HER2 positive, Ki-67 25% ?3. Followed by adjuvant radiation therapy  started 02/04/2021 ?4.  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 ?[facility] nausea study ?--------------------------------------------------------------------------------------------------------------------------------------- ?Current treatment: Herceptin and Perjeta maintenance.  Today is her last treatment of Herceptin and Perjeta. ?Toxicities: Tolerating it well without any problems. ?Echocardiogram  06/23/2021: EF 55 to 60%. ? ?Breast lymphedema: Patient has seen physical therapy ?Chemotherapy-induced anemia: Hemoglobin is stuck at 10.8. ? ?Anastrozole toxicities: ?Breast cancer surveillance: Mammogram scheduled for 07/10/2021 ?Bone density will also need to be scheduled. ?I sent a message to Dr. [name] to remove the port. ? ?Follow-up in 6 months with labs. ? ? ? ?Orders Placed This Encounter  ?Procedures  ? SPEP with reflex to  IFE  ?  Standing Status:   Future  ?  Standing Expiration Date:   07/03/2022  ? ?The patient has a good understanding of the overall plan. she agrees with it. she will call with any problems that may develop before the next visit here. ?Total time spent: 30 mins including face to face time and time spent for planning, charting and co-ordination of care ? ? Harriette Ohara, MD ?07/03/21 ? ? ? I Gardiner Coins am scribing for Dr. Lindi Adie ? ?I have reviewed the above documentation for accuracy and completeness, and I agree with the above. ?  ?

## 2021-07-03 ENCOUNTER — Other Ambulatory Visit: Payer: Self-pay

## 2021-07-03 ENCOUNTER — Inpatient Hospital Stay (HOSPITAL_BASED_OUTPATIENT_CLINIC_OR_DEPARTMENT_OTHER): Payer: BC Managed Care – PPO | Admitting: Hematology and Oncology

## 2021-07-03 ENCOUNTER — Inpatient Hospital Stay: Payer: BC Managed Care – PPO

## 2021-07-03 ENCOUNTER — Other Ambulatory Visit: Payer: Self-pay | Admitting: *Deleted

## 2021-07-03 VITALS — BP 136/60 | HR 68 | Temp 97.3°F | Resp 18 | Ht 66.0 in | Wt 203.6 lb

## 2021-07-03 VITALS — BP 125/76 | HR 69 | Temp 98.1°F | Resp 18

## 2021-07-03 DIAGNOSIS — Z170 Estrogen receptor positive status [ER+]: Secondary | ICD-10-CM | POA: Diagnosis not present

## 2021-07-03 DIAGNOSIS — Z5112 Encounter for antineoplastic immunotherapy: Secondary | ICD-10-CM | POA: Diagnosis not present

## 2021-07-03 DIAGNOSIS — C50411 Malignant neoplasm of upper-outer quadrant of right female breast: Secondary | ICD-10-CM

## 2021-07-03 DIAGNOSIS — Z95828 Presence of other vascular implants and grafts: Secondary | ICD-10-CM

## 2021-07-03 LAB — CBC WITH DIFFERENTIAL (CANCER CENTER ONLY)
Abs Immature Granulocytes: 0.01 10*3/uL (ref 0.00–0.07)
Basophils Absolute: 0 10*3/uL (ref 0.0–0.1)
Basophils Relative: 0 %
Eosinophils Absolute: 0.1 10*3/uL (ref 0.0–0.5)
Eosinophils Relative: 3 %
HCT: 33.1 % — ABNORMAL LOW (ref 36.0–46.0)
Hemoglobin: 10.8 g/dL — ABNORMAL LOW (ref 12.0–15.0)
Immature Granulocytes: 0 %
Lymphocytes Relative: 34 %
Lymphs Abs: 1.6 10*3/uL (ref 0.7–4.0)
MCH: 27.3 pg (ref 26.0–34.0)
MCHC: 32.6 g/dL (ref 30.0–36.0)
MCV: 83.6 fL (ref 80.0–100.0)
Monocytes Absolute: 0.4 10*3/uL (ref 0.1–1.0)
Monocytes Relative: 9 %
Neutro Abs: 2.5 10*3/uL (ref 1.7–7.7)
Neutrophils Relative %: 54 %
Platelet Count: 244 10*3/uL (ref 150–400)
RBC: 3.96 MIL/uL (ref 3.87–5.11)
RDW: 14.4 % (ref 11.5–15.5)
WBC Count: 4.6 10*3/uL (ref 4.0–10.5)
nRBC: 0 % (ref 0.0–0.2)

## 2021-07-03 LAB — CMP (CANCER CENTER ONLY)
ALT: 17 U/L (ref 0–44)
AST: 17 U/L (ref 15–41)
Albumin: 4 g/dL (ref 3.5–5.0)
Alkaline Phosphatase: 111 U/L (ref 38–126)
Anion gap: 5 (ref 5–15)
BUN: 17 mg/dL (ref 6–20)
CO2: 29 mmol/L (ref 22–32)
Calcium: 8.9 mg/dL (ref 8.9–10.3)
Chloride: 106 mmol/L (ref 98–111)
Creatinine: 0.7 mg/dL (ref 0.44–1.00)
GFR, Estimated: >60 mL/min (ref >60)
Glucose, Bld: 106 mg/dL — ABNORMAL HIGH (ref 70–99)
Potassium: 4.1 mmol/L (ref 3.5–5.1)
Sodium: 140 mmol/L (ref 135–145)
Total Bilirubin: 0.3 mg/dL (ref 0.3–1.2)
Total Protein: 7 g/dL (ref 6.5–8.1)

## 2021-07-03 MED ORDER — TRASTUZUMAB-DKST CHEMO 150 MG IV SOLR
6.0000 mg/kg | Freq: Once | INTRAVENOUS | Status: AC
  Administered 2021-07-03: 546 mg via INTRAVENOUS
  Filled 2021-07-03: qty 26

## 2021-07-03 MED ORDER — SODIUM CHLORIDE 0.9 % IV SOLN: Freq: Once | INTRAVENOUS | Status: AC

## 2021-07-03 MED ORDER — SODIUM CHLORIDE 0.9% FLUSH
10.0000 mL | Freq: Once | INTRAVENOUS | Status: AC
  Administered 2021-07-03: 10 mL

## 2021-07-03 MED ORDER — HEPARIN SOD (PORK) LOCK FLUSH 100 UNIT/ML IV SOLN
500.0000 [IU] | Freq: Once | INTRAVENOUS | Status: AC | PRN
  Administered 2021-07-03: 500 [IU]

## 2021-07-03 MED ORDER — ACETAMINOPHEN 325 MG PO TABS
650.0000 mg | ORAL_TABLET | Freq: Once | ORAL | Status: AC
  Administered 2021-07-03: 650 mg via ORAL
  Filled 2021-07-03: qty 2

## 2021-07-03 MED ORDER — SODIUM CHLORIDE 0.9% FLUSH
10.0000 mL | INTRAVENOUS | Status: DC | PRN
  Administered 2021-07-03: 10 mL

## 2021-07-03 MED ORDER — DIPHENHYDRAMINE HCL 25 MG PO CAPS
25.0000 mg | ORAL_CAPSULE | Freq: Once | ORAL | Status: AC
  Administered 2021-07-03: 25 mg via ORAL
  Filled 2021-07-03: qty 1

## 2021-07-03 MED ORDER — SODIUM CHLORIDE 0.9 % IV SOLN
420.0000 mg | Freq: Once | INTRAVENOUS | Status: AC
  Administered 2021-07-03: 420 mg via INTRAVENOUS
  Filled 2021-07-03: qty 14

## 2021-07-03 NOTE — Assessment & Plan Note (Signed)
07/08/2020:?Screening mammogram showed right breast calcifications. Diagnostic mammogram showed right breast calcifications spanning 4.1cm and no abnormal right axillary lymph nodes. 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 ?Genetics done in 2019: Negative ?? ?Treatment plan: ?1. Neoadjuvant chemotherapy with [place] Perjeta ?6 cycles?completed 11/15/2020?followed by Herceptin Perjeta maintenance for 1 year ?2.?12/11/2020:Right breast lumpectomy Sandy Goodwin): Scattered foci of high-grade DCIS with calcifications, no invasive cancer, pathologic complete response, margins negative, 0/3 lymph nodes negative, previously ER 80%, PR 40%, HER2 positive, Ki-67 25% ?3. 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 ?[facility]?nausea study ?--------------------------------------------------------------------------------------------------------------------------------------- ?Current treatment:?Herceptin and Perjeta maintenance.  Today is her last treatment of Herceptin and Perjeta. ?Toxicities: Tolerating it well without any problems. ?Echocardiogram  06/23/2021: EF 55 to 60%. ? ?Breast lymphedema: Patient has seen physical therapy ?Recommended tear ? ?Anastrozole toxicities: ?Breast cancer surveillance: Mammogram scheduled for 07/10/2021 ? ? ?

## 2021-07-03 NOTE — Patient Instructions (Signed)
[facility] MEDICAL ONCOLOGY  Discharge Instructions: Thank you for choosing [facility] to provide your oncology and hematology care.   If you have a lab appointment with the Cancer Center, please go directly to the Cancer Center and check in at the registration area.   Wear comfortable clothing and clothing appropriate for easy access to any Portacath or PICC line.   We strive to give you quality time with your provider. You may need to reschedule your appointment if you arrive late (15 or more minutes).  Arriving late affects you and other patients whose appointments are after yours.  Also, if you miss three or more appointments without notifying the office, you may be dismissed from the clinic at the provider's discretion.      For prescription refill requests, have your pharmacy contact our office and allow 72 hours for refills to be completed.    Today you received the following chemotherapy and/or immunotherapy agents: Trastuzumab, Pertuzumab.      To help prevent nausea and vomiting after your treatment, we encourage you to take your nausea medication as directed.  BELOW ARE SYMPTOMS THAT SHOULD BE REPORTED IMMEDIATELY: *FEVER GREATER THAN 100.4 F (38 C) OR HIGHER *CHILLS OR SWEATING *NAUSEA AND VOMITING THAT IS NOT CONTROLLED WITH YOUR NAUSEA MEDICATION *UNUSUAL SHORTNESS OF BREATH *UNUSUAL BRUISING OR BLEEDING *URINARY PROBLEMS (pain or burning when urinating, or frequent urination) *BOWEL PROBLEMS (unusual diarrhea, constipation, pain near the anus) TENDERNESS IN MOUTH AND THROAT WITH OR WITHOUT PRESENCE OF ULCERS (sore throat, sores in mouth, or a toothache) UNUSUAL RASH, SWELLING OR PAIN  UNUSUAL VAGINAL DISCHARGE OR ITCHING   Items with * indicate a potential emergency and should be followed up as soon as possible or go to the Emergency Department if any problems should occur.  Please show the CHEMOTHERAPY ALERT CARD or IMMUNOTHERAPY ALERT CARD  at check-in to the Emergency Department and triage nurse.  Should you have questions after your visit or need to cancel or reschedule your appointment, please contact [facility] MEDICAL [facility]COL[facility]  Dept: [phone]  and follow the prompts.  Office hours are 8:00 a.m. to 4:30 p.m. Monday - Friday. Please note that voicemails left after 4:00 p.m. may not be returned until the following business day.  We are closed weekends and major holidays. You have access to a nurse at all times for urgent questions. Please call the main number to the clinic Dept: [phone] and follow the prompts.   For any non-urgent questions, you may also contact your provider using MyChart. We now offer e-Visits for anyone 18 and older to request care online for non-urgent symptoms. For details visit [email or web address].   Also download the MyChart app! Go to the app store, search "MyChart", open the app, select [facility], and log in with your MyChart username and password.  Due to Covid, a mask is required upon entering the hospital/clinic. If you do not have a mask, one will be given to you upon arrival. For doctor visits, patients may have 1 support person aged 18 or older with them. For treatment visits, patients cannot have anyone with them due to current Covid guidelines and our immunocompromised population.  

## 2021-07-03 NOTE — Progress Notes (Signed)
Pt observed for 30 minutes post Pertuzumab infusion. VSS. No complaints at time of discharge.  ?

## 2021-07-03 NOTE — Addendum Note (Signed)
Addended by: Nicholas Lose on: 07/03/2021 10:06 AM ? ? Modules accepted: Orders ? ?

## 2021-07-04 ENCOUNTER — Encounter (INDEPENDENT_AMBULATORY_CARE_PROVIDER_SITE_OTHER): Payer: BC Managed Care – PPO | Admitting: Ophthalmology

## 2021-07-04 ENCOUNTER — Ambulatory Visit: Payer: BC Managed Care – PPO | Admitting: Rehabilitation

## 2021-07-04 ENCOUNTER — Encounter: Payer: Self-pay | Admitting: Rehabilitation

## 2021-07-04 ENCOUNTER — Telehealth: Payer: Self-pay | Admitting: Hematology and Oncology

## 2021-07-04 DIAGNOSIS — Z483 Aftercare following surgery for neoplasm: Secondary | ICD-10-CM | POA: Diagnosis not present

## 2021-07-04 DIAGNOSIS — Z170 Estrogen receptor positive status [ER+]: Secondary | ICD-10-CM

## 2021-07-04 DIAGNOSIS — I890 Lymphedema, not elsewhere classified: Secondary | ICD-10-CM

## 2021-07-04 DIAGNOSIS — R293 Abnormal posture: Secondary | ICD-10-CM

## 2021-07-04 NOTE — Telephone Encounter (Signed)
Scheduled appointment per 3/23 los. Patient is aware. ?

## 2021-07-04 NOTE — Therapy (Signed)
?OUTPATIENT PHYSICAL THERAPY ONCOLOGY Treatment ? ?Patient Name: Sandy Goodwin ?MRN: [id number] ?DOB:02/24/1971, 51 y.o., female ?Today's Date: 07/04/2021 ? ? PT End of Session - 07/04/21 0956   ? ? Visit Number 4   ? Number of Visits 9   ? Date for PT Re-Evaluation 07/31/21   ? PT Start Time 1000   ? PT Stop Time 1051   SOZO time unbilled x 6min  ? PT Time Calculation (min) 51 min   ? Activity Tolerance Patient tolerated treatment well   ? Behavior During Therapy [facility] for tasks assessed/performed   ? ?  ?  ? ?  ? ? ?Past Medical History:  ?Diagnosis Date  ? Arthritis   ? Breast cancer (HCC)   ? Complication of anesthesia   ? Itching  ? Dysrhythmia   ? Tachycardia  ? Hypothyroidism   ? ?Past Surgical History:  ?Procedure Laterality Date  ? ABDOMINAL HYSTERECTOMY    ? APPENDECTOMY  2008  ? Bowel Rupture Repair  2008  ? Ruptured during appendectomy  ? Breast Biopsy Right 1999  ? BREAST LUMPECTOMY WITH RADIOACTIVE SEED AND SENTINEL LYMPH NODE BIOPSY Right 12/11/2020  ? Procedure: RIGHT BREAST LUMPECTOMY WITH RADIOACTIVE SEED X2 AND RIGHT AXILLARY SENTINEL LYMPH NODE BIOPSY;  Surgeon: Wakefield, Matthew, MD;  Location: [facility];  Service: General;  Laterality: Right;  ? BREAST RECONSTRUCTION Right 12/20/2020  ? Procedure: RIGHT ONCOPLASTIC RECONSTRUCTION;  Surgeon: Thimmappa, Brinda, MD;  Location: [facility];  Service: Plastics;  Laterality: Right;  ? BREAST REDUCTION SURGERY Left 12/20/2020  ? Procedure: LEFT BREAST REDUCTION;  Surgeon: Thimmappa, Brinda, MD;  Location: [facility];  Service: Plastics;  Laterality: Left;  ? COLONOSCOPY N/A 06/15/2016  ? Procedure: COLONOSCOPY;  Surgeon: Najeeb U Rehman, MD;  [facility];  Service: Endoscopy;  Laterality: N/A;  845  ? IBF  2010,2012  ? INCISION AND DRAINAGE OF WOUND Right 01/10/2021  ? Procedure: INCISION AND DRAINAGE OF RIGHT AXILLA;  Surgeon: Thimmappa, Brinda, MD;  Location: [facility];  Service: Plastics;   Laterality: Right;  ? PORTACATH PLACEMENT Left 07/23/2020  ? Procedure: INSERTION PORT-A-CATH;  Surgeon: Wakefield, Matthew, MD;  [facility];  Service: General;  Laterality: Left;  ? THYROIDECTOMY, PARTIAL Bilateral 2012  ? ?Patient Active Problem List  ? Diagnosis Date Noted  ? Uterine prolapse 03/20/2021  ? Polyarthralgia 03/20/2021  ? Port-A-Cath in place 02/06/2021  ? Genetic testing 07/17/2020  ? Malignant neoplasm of upper-outer quadrant of right breast in female, estrogen receptor positive (HCC) 07/12/2020  ? Change in bowel habits 06/12/2016  ? Mixed hyperlipidemia 01/07/2016  ? Hypothyroidism, unspecified 01/07/2016  ? Allergic rhinitis 04/02/2015  ? 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 ? ?PCP: Daniel, Terry G, MD ? ?REFERRING PROVIDER: Wakefield, Matthew, MD ? ?REFERRING DIAG: Rt breast lymphedema ? ?THERAPY DIAG:  ?Aftercare following surgery for neoplasm ? ?Abnormal posture ? ?Malignant neoplasm of upper-outer quadrant of right breast in female, estrogen receptor positive (HCC) ? ?Lymphedema, not elsewhere classified ? ?ONSET DATE: 03/25/21 ? ?SUBJECTIVE                                                                                                                                                                                          ? ?  SUBJECTIVE STATEMENT: ?Maybe a little bit better.   ? 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 There are 4.1 cm of calcifications.with a Ki67 of 25%; pt complete chemo in Aug 2022, 12/11/20 - R breast lumpectomy and SLNB (0/3), 12/20/20- oncoplasty on R and breast reduction on the L, 9/30 pt had to have another surgery to drain the R axilla which had an absess, radiation completed ? ?PAIN:  ?Are you having pain? No ?Intermittent shooting pains in the breast and more pain by the end of the day  ? ?PRECAUTIONS: Other: lymphedema risk Rt UE ? ?PATIENT  GOALS improve the breast discomfort  ? ? ?OBJECTIVE ?TODAY'S TREATMENT  ?07/04/21 ?With pt permission: In supine: Short neck, 5 diaphragmatic breaths, superficial and deep abdominals,  bil axillary nodes and establishment of interaxillary pathway, Rt inguinal nodes and establishment of axilloinguinal pathway, then Rt breast moving fluid towards pathways spending extra time in any areas of fibrosis then retracing all steps.  Then reviewed all steps with pt performing each.  Pt needing reminders and tcs/vcs for hand placement and lighter pressure ?Scar tissue mobilization along inferior incision using cocoa butter using gentle pressure with cross wise/circles/and pressure away in both directions ?Repeated SOZO today which was WNL - scheduled for 3 more months ? ?06/19/21 ?Education on compression bras given info and prescription from Dr. Gudena ?Education on lymphatic anatomy of the breast and reasons for MLD and compression with education on self MLD per handout below ?Performed seated in front of the mirror: With patient permission: supraclavicular nodes, 5 diaphragmatic breaths, bil axillary nodes and establishment of interaxillary pathway, R inguinal nodes and establishment of axilloinguinal pathway, then R breast moving fluid towards pathways.  ? ?PATIENT EDUCATION:  ?Education details: self MLD, compression bra, POC ?Person educated: Patient ?Education method: Explanation, Demonstration, Tactile cues, Verbal cues, and Handouts ?Education comprehension: verbalized understanding, returned demonstration, verbal cues required, tactile cues required, and needs further education ? ? ?HOME EXERCISE PROGRAM: ?Self MLD ? ?ASSESSMENT: ? ?CLINICAL IMPRESSION: Pt continues with general edema Rt breast, increased scar tissue.  First session of PT breast MLD and review of home MLD.  Pt needs continued visits to become ind with self MLD and decreased scar tissue blockage.   ? ?OBJECTIVE IMPAIRMENTS decreased knowledge of use of  DME and increased edema.  ? ?ACTIVITY LIMITATIONS  none .  ? ?PERSONAL FACTORS  radiation hx, SLNB  are also affecting patient's functional outcome.  ? ? ?REHAB POTENTIAL: Excellent ? ?CLINICAL DECISION MAKING: Stable/uncomplicated ? ?EVALUATION COMPLEXITY: Low ? ?GOALS: ?Goals reviewed with patient? Yes ? ?LONG TERM GOALS: ? ?Pt will be independent in self MLD for the Rt breast ?Baseline:  ?Target date: 08/15/2021 ?Goal status: INITIAL ? ?2.  Pt will obtain appropriate compression bra for edema management ?Baseline:  ?Target date: 08/15/2021 ?Goal status: INITIAL ? ?3.  Pt will decrease breast edema questionnaire to 18 or less to demonstrate improvement in edema severity ?Baseline:   ?Target date: 08/15/2021 ?Goal status: INITIAL ? ? ?PLAN: ?PT FREQUENCY: 1-2x/week ? ?PT DURATION: 6 weeks ? ?PLANNED INTERVENTIONS: Therapeutic exercises, Patient/Family education, Manual lymph drainage, Taping, and Manual therapy ? ?PLAN FOR NEXT SESSION:  Perform Rt breast MLD with review of steps as needed, scar tissue work ? ? ?Tevis, Kara R, PT ?07/04/2021, 11:01 AM ? ?

## 2021-07-07 ENCOUNTER — Ambulatory Visit: Payer: BC Managed Care – PPO

## 2021-07-10 ENCOUNTER — Ambulatory Visit
Admission: RE | Admit: 2021-07-10 | Discharge: 2021-07-10 | Disposition: A | Discharge status: Home / Self Care | Payer: BC Managed Care – PPO | Source: Ambulatory Visit | Attending: Adult Health | Admitting: Adult Health

## 2021-07-10 DIAGNOSIS — C50411 Malignant neoplasm of upper-outer quadrant of right female breast: Secondary | ICD-10-CM

## 2021-07-10 IMAGING — MG DIGITAL DIAGNOSTIC BILAT W/ TOMO W/ CAD
6 of 10 series · 6 of 26 positions shown · non-contrast
Comparison: Previous exam(s).

CLINICAL DATA: Right lumpectomy and bilateral breast reduction.

EXAM:
DIGITAL DIAGNOSTIC BILATERAL MAMMOGRAM WITH TOMOSYNTHESIS AND CAD
TECHNIQUE: Bilateral digital diagnostic mammography and breast tomosynthesis
was performed. The images were evaluated with computer-aided
detection.

[R ML (1 of 2)]
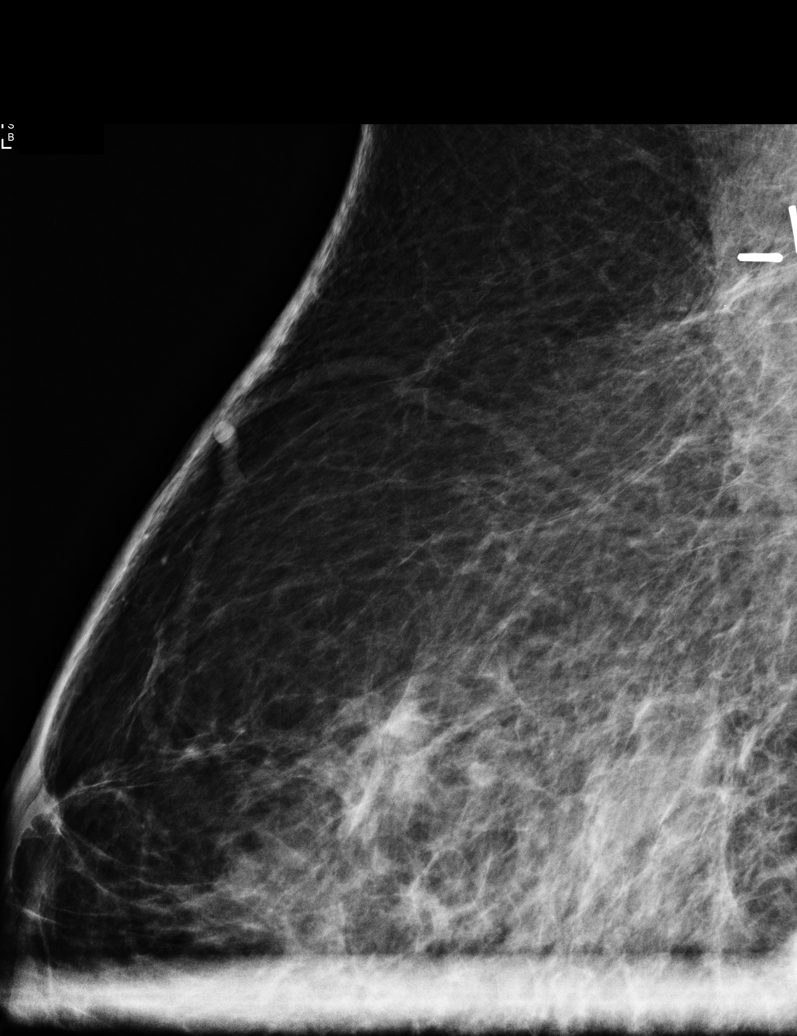

[R ML (2 of 2)]
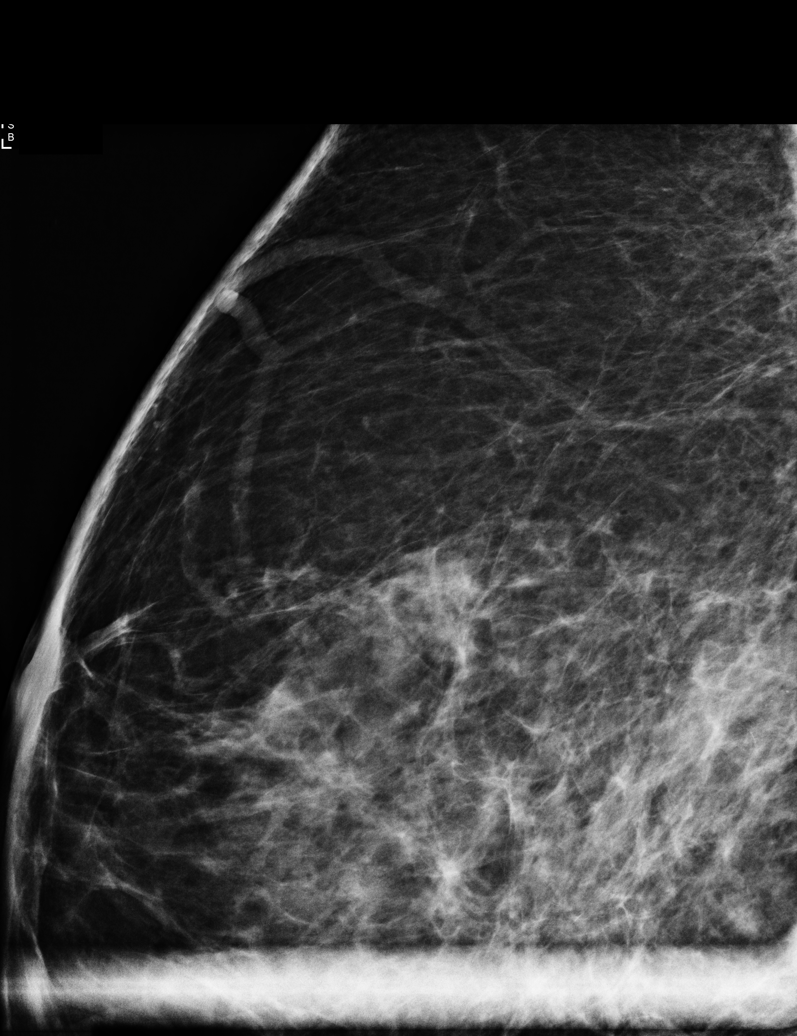

[L MLO synth-2D]
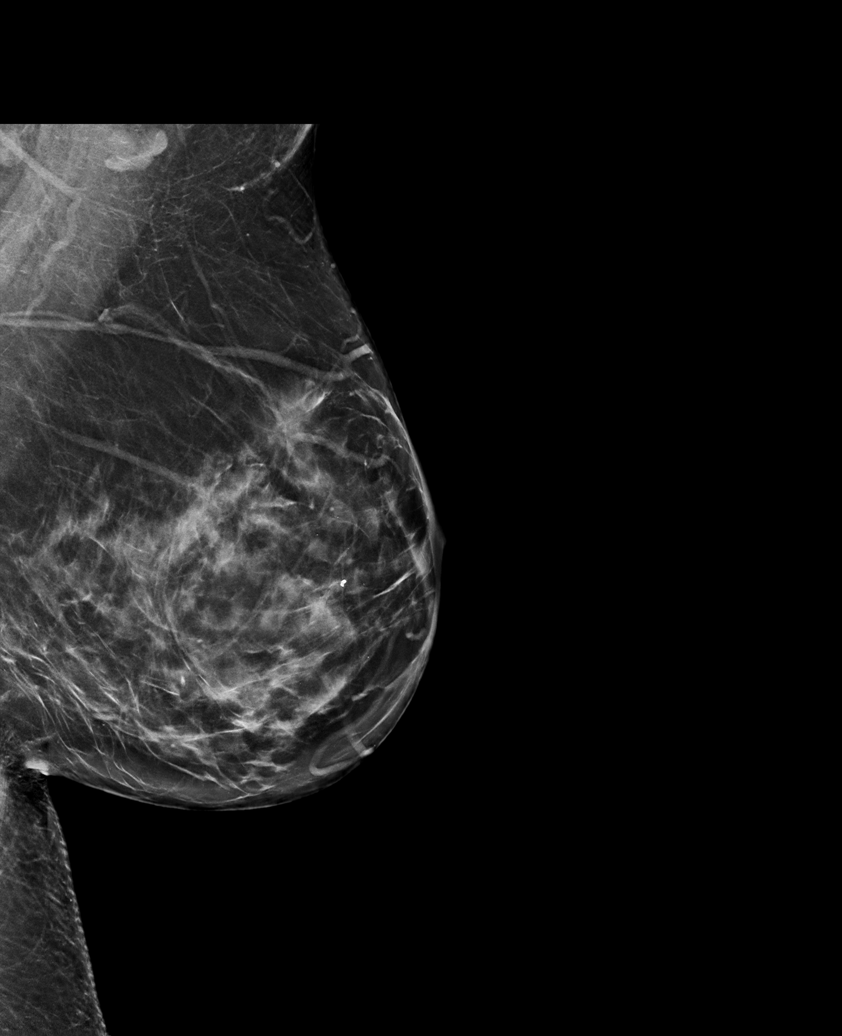

[R CC synth-2D]
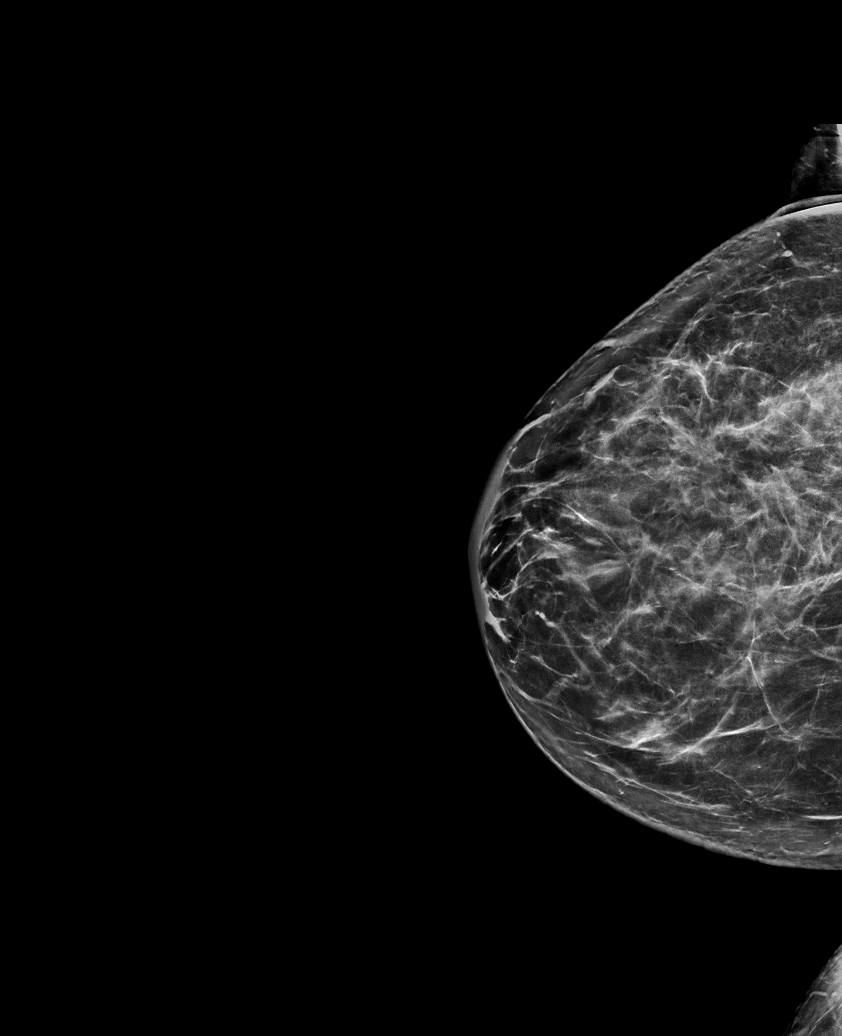

[L CC synth-2D]
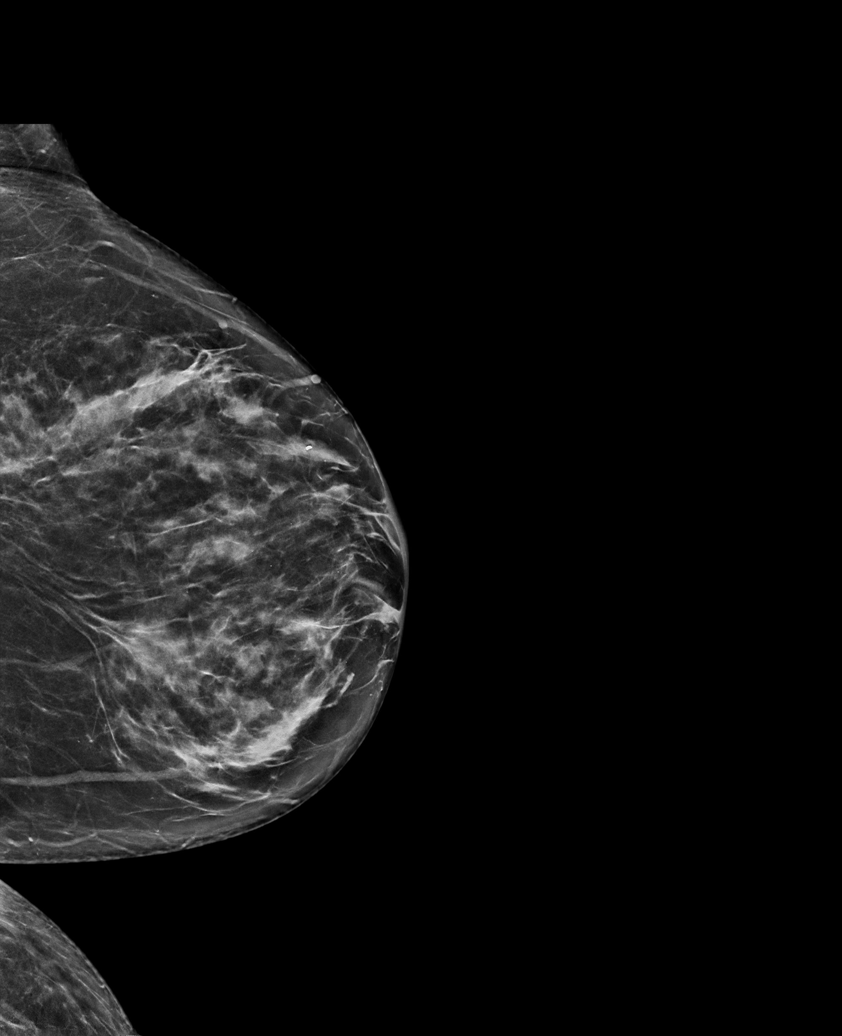

[R MLO synth-2D]
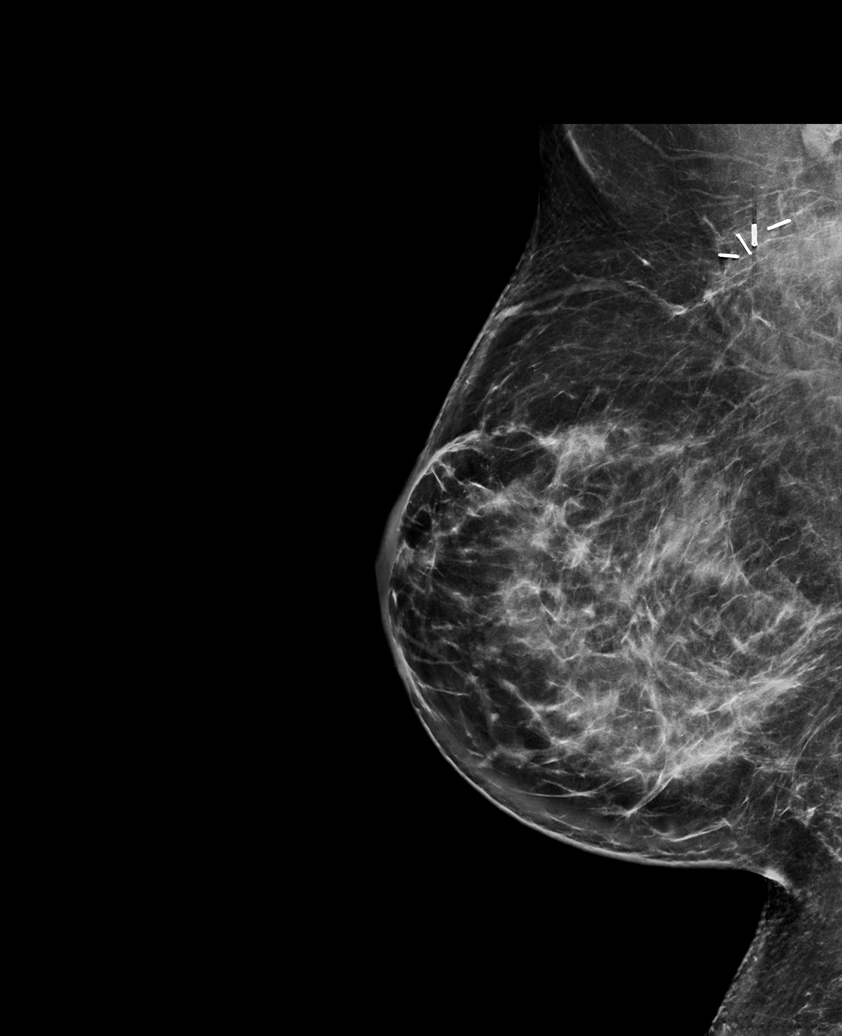

[6 of 26 positions shown; findings below may reference images not displayed]

ACR Breast Density Category c: The breast tissue is heterogeneously
dense, which may obscure small masses.
FINDINGS: No suspicious masses, calcifications, or distortion in either
breast. Post lumpectomy and reduction changes are identified.
IMPRESSION: No mammographic evidence of malignancy.

RECOMMENDATION:
Annual diagnostic mammography.

I have discussed the findings and recommendations with the patient.
If applicable, a reminder letter will be sent to the patient
regarding the next appointment.

BI-RADS CATEGORY  2: Benign.

## 2021-07-11 ENCOUNTER — Ambulatory Visit: Payer: BC Managed Care – PPO | Admitting: Rehabilitation

## 2021-07-11 ENCOUNTER — Encounter: Payer: Self-pay | Admitting: Rehabilitation

## 2021-07-11 DIAGNOSIS — Z170 Estrogen receptor positive status [ER+]: Secondary | ICD-10-CM

## 2021-07-11 DIAGNOSIS — R293 Abnormal posture: Secondary | ICD-10-CM

## 2021-07-11 DIAGNOSIS — Z483 Aftercare following surgery for neoplasm: Secondary | ICD-10-CM

## 2021-07-11 DIAGNOSIS — I890 Lymphedema, not elsewhere classified: Secondary | ICD-10-CM

## 2021-07-11 NOTE — Therapy (Signed)
?OUTPATIENT PHYSICAL THERAPY ONCOLOGY Treatment ? ?Patient Name: Sandy Goodwin ?MRN: [id number] ?DOB:[birth year], 51 y.o., female ?Today's Date: 07/11/2021 ? ? PT End of Session - 07/11/21 1004   ? ? Visit Number 5   ? Number of Visits 9   ? Date for PT Re-Evaluation 07/31/21   ? PT Start Time 1005   ? PT Stop Time 1050   ? PT Time Calculation (min) 45 min   ? Activity Tolerance Patient tolerated treatment well   ? Behavior During Therapy [facility] for tasks assessed/performed   ? ?  ?  ? ?  ? ? ?Past Medical History:  ?Diagnosis Date  ? Arthritis   ? Breast cancer ([place])   ? Complication of anesthesia   ? Itching  ? Dysrhythmia   ? Tachycardia  ? Hypothyroidism   ? ?Past Surgical History:  ?Procedure Laterality Date  ? ABDOMINAL HYSTERECTOMY    ? APPENDECTOMY  2008  ? Bowel Rupture Repair  2008  ? Ruptured during appendectomy  ? Breast Biopsy Right 1999  ? BREAST LUMPECTOMY WITH RADIOACTIVE SEED AND SENTINEL LYMPH NODE BIOPSY Right 12/11/2020  ? Procedure: RIGHT BREAST LUMPECTOMY WITH RADIOACTIVE SEED X2 AND RIGHT AXILLARY SENTINEL LYMPH NODE BIOPSY;  Surgeon: Rolm Bookbinder, MD;  Location: [place];  Service: General;  Laterality: Right;  ? BREAST RECONSTRUCTION Right 12/20/2020  ? Procedure: RIGHT ONCOPLASTIC RECONSTRUCTION;  Surgeon: Irene Limbo, MD;  Location: [place];  Service: Plastics;  Laterality: Right;  ? BREAST REDUCTION SURGERY Left 12/20/2020  ? Procedure: LEFT BREAST REDUCTION;  Surgeon: Irene Limbo, MD;  Location: [place];  Service: Plastics;  Laterality: Left;  ? COLONOSCOPY N/A 06/15/2016  ? Procedure: COLONOSCOPY;  Surgeon: Rogene Houston, MD;  [facility];  Service: Endoscopy;  Laterality: N/A;  845  ? IBF  [id number]  ? INCISION AND DRAINAGE OF WOUND Right 01/10/2021  ? Procedure: INCISION AND DRAINAGE OF RIGHT AXILLA;  Surgeon: Irene Limbo, MD;  Location: [place];  Service: Plastics;  Laterality: Right;  ?  PORTACATH PLACEMENT Left 07/23/2020  ? Procedure: INSERTION PORT-A-CATH;  Surgeon: Rolm Bookbinder, MD;  Location: [place];  Service: General;  Laterality: Left;  ? THYROIDECTOMY, PARTIAL Bilateral 2012  ? ?Patient Active Problem List  ? Diagnosis Date Noted  ? Uterine prolapse 03/20/2021  ? Polyarthralgia 03/20/2021  ? Port-A-Cath in place 02/06/2021  ? Genetic testing 07/17/2020  ? Malignant neoplasm of upper-outer quadrant of right breast in female, estrogen receptor positive ([place]) 07/12/2020  ? Change in bowel habits 06/12/2016  ? Mixed hyperlipidemia 01/07/2016  ? Hypothyroidism, unspecified 01/07/2016  ? Allergic rhinitis 04/02/2015  ? 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 ? ?PCP: Caryl Bis, MD ? ?REFERRING PROVIDER: Caryl Bis, MD ? ?REFERRING DIAG: Rt breast lymphedema ? ?THERAPY DIAG:  ?Aftercare following surgery for neoplasm ? ?Abnormal posture ? ?Malignant neoplasm of upper-outer quadrant of right breast in female, estrogen receptor positive ([place]) ? ?Lymphedema, not elsewhere classified ? ?ONSET DATE: 03/25/21 ? ?SUBJECTIVE                                                                                                                                                                                          ? ?  SUBJECTIVE STATEMENT: ? It really felt great after last time for a few days.  I think the scar massage helped a lot.  My husband scott is here to learn the MLD ? 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 There are 4.1 cm of calcifications.with a Ki67 of 25%; pt complete chemo in Aug 2022, 12/11/20 - R breast lumpectomy and SLNB (0/3), 12/20/20- oncoplasty on R and breast reduction on the L, 9/30 pt had to have another surgery to drain the R axilla which had an absess, radiation completed ? ?PAIN:  ?Are you having pain? No ?Intermittent shooting pains in the breast and more pain  by the end of the day  ? ?PRECAUTIONS: Other: lymphedema risk Rt UE ? ?PATIENT GOALS improve the breast discomfort  ? ? ?OBJECTIVE ?TODAY'S TREATMENT  ?07/11/21 ?Pts husband scott educated on lympathic anatomy and physiology and basics of MLD.  Started with scar tissue education on inferior breast scar mostly lateral with crosswise, circles and pressure away - vcs to decrease pressure but overall did very well.  ?With pt permission: In supine: Short neck, 5 diaphragmatic breaths, superficial and deep abdominals,  bil axillary nodes and establishment of interaxillary pathway, Rt inguinal nodes and establishment of axilloinguinal pathway, then Rt breast moving fluid towards pathways spending extra time in any areas of fibrosis then retracing all steps.  Then reviewed all steps with spouse performing each.  Pt needing reminders and tcs/vcs for hand placement and lighter pressure. Then PT performed more with remaining time.  ?Scar tissue mobilization along inferior incision using cocoa butter using gentle pressure with cross wise/circles/and pressure away in both directions ? ?07/04/21 ?With pt permission: In supine: Short neck, 5 diaphragmatic breaths, superficial and deep abdominals,  bil axillary nodes and establishment of interaxillary pathway, Rt inguinal nodes and establishment of axilloinguinal pathway, then Rt breast moving fluid towards pathways spending extra time in any areas of fibrosis then retracing all steps.  Then reviewed all steps with pt performing each.  Pt needing reminders and tcs/vcs for hand placement and lighter pressure ?Scar tissue mobilization along inferior incision using cocoa butter using gentle pressure with cross wise/circles/and pressure away in both directions ?Repeated SOZO today which was WNL - scheduled for 3 more months ? ?06/19/21 ?Education on compression bras given info and prescription from Dr. Lindi Adie ?Education on lymphatic anatomy of the breast and reasons for MLD and compression  with education on self MLD per handout below ?Performed seated in front of the mirror: With patient permission: supraclavicular nodes, 5 diaphragmatic breaths, bil axillary nodes and establishment of interaxillary pathway, R inguinal nodes and establishment of axilloinguinal pathway, then R breast moving fluid towards pathways.  ? ?PATIENT EDUCATION:  ?Education details: self MLD, compression bra, POC ?Person educated: Patient ?Education method: Explanation, Demonstration, Tactile cues, Verbal cues, and Handouts ?Education comprehension: verbalized understanding, returned demonstration, verbal cues required, tactile cues required, and needs further education ? ? ?HOME EXERCISE PROGRAM: ?Self MLD ? ?ASSESSMENT: ? ?CLINICAL IMPRESSION: Pt continues with general edema Rt breast, increased scar tissue.  Improvements with breast MLD and scar massage already. Spouse did very well with education on assisting with MLD and scar work.  Pt needs continued visits to become ind with self MLD and decreased scar tissue blockage.   ? ?OBJECTIVE IMPAIRMENTS decreased knowledge of use of DME and increased edema.  ? ?ACTIVITY LIMITATIONS  none .  ? ?PERSONAL FACTORS  radiation hx,  SLNB  are also affecting patient's functional outcome.  ? ? ?REHAB POTENTIAL: Excellent ? ?CLINICAL DECISION MAKING: Stable/uncomplicated ? ?EVALUATION COMPLEXITY: Low ? ?GOALS: ?Goals reviewed with patient? Yes ? ?LONG TERM GOALS: ? ?Pt will be independent in self MLD for the Rt breast ?Baseline:  ?Target date: 08/22/2021 ?Goal status: INITIAL ? ?2.  Pt will obtain appropriate compression bra for edema management ?Baseline:  ?Target date: 08/22/2021 ?Goal status: INITIAL ? ?3.  Pt will decrease breast edema questionnaire to 18 or less to demonstrate improvement in edema severity ?Baseline:   ?Target date: 08/22/2021 ?Goal status: INITIAL ? ? ?PLAN: ?PT FREQUENCY: 1-2x/week ? ?PT DURATION: 6 weeks ? ?PLANNED INTERVENTIONS: Therapeutic exercises, Patient/Family  education, Manual lymph drainage, Taping, and Manual therapy ? ?PLAN FOR NEXT SESSION:  Perform Rt breast MLD with review of steps as needed, scar tissue work ? ? ?Stark Bray, PT ?07/11/2021, 10:56 AM

## 2021-07-17 ENCOUNTER — Ambulatory Visit: Payer: BC Managed Care – PPO | Attending: General Surgery | Admitting: Rehabilitation

## 2021-07-17 ENCOUNTER — Encounter: Payer: Self-pay | Admitting: Rehabilitation

## 2021-07-17 DIAGNOSIS — Z483 Aftercare following surgery for neoplasm: Secondary | ICD-10-CM | POA: Diagnosis present

## 2021-07-17 DIAGNOSIS — Z170 Estrogen receptor positive status [ER+]: Secondary | ICD-10-CM | POA: Diagnosis present

## 2021-07-17 DIAGNOSIS — C50411 Malignant neoplasm of upper-outer quadrant of right female breast: Secondary | ICD-10-CM | POA: Insufficient documentation

## 2021-07-17 DIAGNOSIS — R293 Abnormal posture: Secondary | ICD-10-CM | POA: Diagnosis present

## 2021-07-17 DIAGNOSIS — I890 Lymphedema, not elsewhere classified: Secondary | ICD-10-CM | POA: Diagnosis present

## 2021-07-17 NOTE — Therapy (Signed)
?OUTPATIENT PHYSICAL THERAPY ONCOLOGY Treatment ? ?Patient Name: Sandy Goodwin ?MRN: [id number] ?DOB:[birth year], 51 y.o., female ?Today's Date: 07/17/2021 ? ? PT End of Session - 07/17/21 1313   ? ? Visit Number 6   ? Number of Visits 9   ? Date for PT Re-Evaluation 07/31/21   ? PT Start Time 1230   ? PT Stop Time 1313   ? PT Time Calculation (min) 43 min   ? Activity Tolerance Patient tolerated treatment well   ? Behavior During Therapy [facility] for tasks assessed/performed   ? ?  ?  ? ?  ? ? ?Past Medical History:  ?Diagnosis Date  ? Arthritis   ? Breast cancer ([place])   ? Complication of anesthesia   ? Itching  ? Dysrhythmia   ? Tachycardia  ? Hypothyroidism   ? ?Past Surgical History:  ?Procedure Laterality Date  ? ABDOMINAL HYSTERECTOMY    ? APPENDECTOMY  2008  ? Bowel Rupture Repair  2008  ? Ruptured during appendectomy  ? Breast Biopsy Right 1999  ? BREAST LUMPECTOMY WITH RADIOACTIVE SEED AND SENTINEL LYMPH NODE BIOPSY Right 12/11/2020  ? Procedure: RIGHT BREAST LUMPECTOMY WITH RADIOACTIVE SEED X2 AND RIGHT AXILLARY SENTINEL LYMPH NODE BIOPSY;  Surgeon: Rolm Bookbinder, MD;  Location: [place];  Service: General;  Laterality: Right;  ? BREAST RECONSTRUCTION Right 12/20/2020  ? Procedure: RIGHT ONCOPLASTIC RECONSTRUCTION;  Surgeon: Irene Limbo, MD;  Location: [place];  Service: Plastics;  Laterality: Right;  ? BREAST REDUCTION SURGERY Left 12/20/2020  ? Procedure: LEFT BREAST REDUCTION;  Surgeon: Irene Limbo, MD;  Location: [place];  Service: Plastics;  Laterality: Left;  ? COLONOSCOPY N/A 06/15/2016  ? Procedure: COLONOSCOPY;  Surgeon: Rogene Houston, MD;  [facility];  Service: Endoscopy;  Laterality: N/A;  845  ? IBF  [phone]  ? INCISION AND DRAINAGE OF WOUND Right 01/10/2021  ? Procedure: INCISION AND DRAINAGE OF RIGHT AXILLA;  Surgeon: Irene Limbo, MD;  Location: [place];  Service: Plastics;  Laterality: Right;  ?  PORTACATH PLACEMENT Left 07/23/2020  ? Procedure: INSERTION PORT-A-CATH;  Surgeon: Rolm Bookbinder, MD;  Location: [place];  Service: General;  Laterality: Left;  ? THYROIDECTOMY, PARTIAL Bilateral 2012  ? ?Patient Active Problem List  ? Diagnosis Date Noted  ? Uterine prolapse 03/20/2021  ? Polyarthralgia 03/20/2021  ? Port-A-Cath in place 02/06/2021  ? Genetic testing 07/17/2020  ? Malignant neoplasm of upper-outer quadrant of right breast in female, estrogen receptor positive ([place]) 07/12/2020  ? Change in bowel habits 06/12/2016  ? Mixed hyperlipidemia 01/07/2016  ? Hypothyroidism, unspecified 01/07/2016  ? Allergic rhinitis 04/02/2015  ? 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 ? ?PCP: Caryl Bis, MD ? ?REFERRING PROVIDER: Rolm Bookbinder, MD ? ?REFERRING DIAG: Rt breast lymphedema ? ?THERAPY DIAG:  ?Aftercare following surgery for neoplasm ? ?Abnormal posture ? ?Malignant neoplasm of upper-outer quadrant of right breast in female, estrogen receptor positive ([place]) ? ?Lymphedema, not elsewhere classified ? ?ONSET DATE: 03/25/21 ? ?SUBJECTIVE                                                                                                                                                                                          ? ?  SUBJECTIVE STATEMENT: ? My husband is doing the scar massage every night and doing a good job.   ? 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 There are 4.1 cm of calcifications.with a Ki67 of 25%; pt complete chemo in Aug 2022, 12/11/20 - R breast lumpectomy and SLNB (0/3), 12/20/20- oncoplasty on R and breast reduction on the L, 9/30 pt had to have another surgery to drain the R axilla which had an absess, radiation completed ? ?PAIN:  ?Are you having pain? No ?Intermittent shooting pains in the breast and more pain by the end of the day  ? ?PRECAUTIONS: Other: lymphedema  risk Rt UE ? ?PATIENT GOALS improve the breast discomfort  ? ? ?OBJECTIVE ?TODAY'S TREATMENT  ?07/17/21 ?With pt permission: In supine: Short neck, 5 diaphragmatic breaths, superficial and deep abdominals,  bil axillary nodes and establishment of interaxillary pathway, Rt inguinal nodes and establishment of axilloinguinal pathway, then Rt breast moving fluid towards pathways spending extra time in any areas of fibrosis then retracing all steps.  Scar tissue mobilization along inferior incision using cocoa butter using gentle pressure with cross wise/circles/and pressure away in both directions ?Then in sidelying STM / release lateral trunk and MLD towards posterior interaxillary ? ?07/11/21 ?Pts husband scott educated on lympathic anatomy and physiology and basics of MLD.  Started with scar tissue education on inferior breast scar mostly lateral with crosswise, circles and pressure away - vcs to decrease pressure but overall did very well.  ?With pt permission: In supine: Short neck, 5 diaphragmatic breaths, superficial and deep abdominals,  bil axillary nodes and establishment of interaxillary pathway, Rt inguinal nodes and establishment of axilloinguinal pathway, then Rt breast moving fluid towards pathways spending extra time in any areas of fibrosis then retracing all steps.  Then reviewed all steps with spouse performing each.  Pt needing reminders and tcs/vcs for hand placement and lighter pressure. Then PT performed more with remaining time.  ?Scar tissue mobilization along inferior incision using cocoa butter using gentle pressure with cross wise/circles/and pressure away in both directions ? ?07/04/21 ?With pt permission: In supine: Short neck, 5 diaphragmatic breaths, superficial and deep abdominals,  bil axillary nodes and establishment of interaxillary pathway, Rt inguinal nodes and establishment of axilloinguinal pathway, then Rt breast moving fluid towards pathways spending extra time in any areas of  fibrosis then retracing all steps.  Then reviewed all steps with pt performing each.  Pt needing reminders and tcs/vcs for hand placement and lighter pressure ?Scar tissue mobilization along inferior incision using cocoa butter using gentle pressure with cross wise/circles/and pressure away in both directions ?Repeated SOZO today which was WNL - scheduled for 3 more months ? ?PATIENT EDUCATION:  ?Education details: self MLD, compression bra, POC ?Person educated: Patient ?Education method: Explanation, Demonstration, Tactile cues, Verbal cues, and Handouts ?Education comprehension: verbalized understanding, returned demonstration, verbal cues required, tactile cues required, and needs further education ? ? ?HOME EXERCISE PROGRAM: ?Self MLD ? ?ASSESSMENT: ? ?CLINICAL IMPRESSION: Pt is much less swollen today and the scar region is much softer.     ? ?OBJECTIVE IMPAIRMENTS decreased knowledge of use of DME and increased edema.  ? ?ACTIVITY LIMITATIONS  none .  ? ?PERSONAL FACTORS  radiation hx, SLNB  are also affecting patient's functional outcome.  ? ? ?REHAB POTENTIAL: Excellent ? ?CLINICAL DECISION MAKING: Stable/uncomplicated ? ?EVALUATION COMPLEXITY: Low ? ?GOALS: ?Goals reviewed with patient? Yes ? ?LONG TERM GOALS: ? ?  Pt will be independent in self MLD for the Rt breast ?Baseline:  ?Target date: 08/28/2021 ?Goal status: INITIAL ? ?2.  Pt will obtain appropriate compression bra for edema management ?Baseline:  ?Target date: 08/28/2021 ?Goal status: INITIAL ? ?3.  Pt will decrease breast edema questionnaire to 18 or less to demonstrate improvement in edema severity ?Baseline:   ?Target date: 08/28/2021 ?Goal status: INITIAL ? ? ?PLAN: ?PT FREQUENCY: 1-2x/week ? ?PT DURATION: 6 weeks ? ?PLANNED INTERVENTIONS: Therapeutic exercises, Patient/Family education, Manual lymph drainage, Taping, and Manual therapy ? ?PLAN FOR NEXT SESSION:  Perform Rt breast MLD with review of steps as needed, scar tissue work ? ? ?Stark Bray, PT ?07/17/2021, 1:14 PM ? ?

## 2021-07-24 ENCOUNTER — Encounter: Payer: Self-pay | Admitting: *Deleted

## 2021-07-24 ENCOUNTER — Encounter: Payer: BC Managed Care – PPO | Admitting: Physical Therapy

## 2021-07-24 ENCOUNTER — Inpatient Hospital Stay: Payer: BC Managed Care – PPO | Attending: Hematology and Oncology

## 2021-07-24 ENCOUNTER — Other Ambulatory Visit: Payer: Self-pay

## 2021-07-24 ENCOUNTER — Inpatient Hospital Stay: Payer: BC Managed Care – PPO

## 2021-07-24 VITALS — BP 116/67 | HR 62 | Temp 97.9°F | Resp 18 | Ht 66.0 in | Wt 207.5 lb

## 2021-07-24 DIAGNOSIS — Z170 Estrogen receptor positive status [ER+]: Secondary | ICD-10-CM

## 2021-07-24 DIAGNOSIS — Z95828 Presence of other vascular implants and grafts: Secondary | ICD-10-CM

## 2021-07-24 DIAGNOSIS — C50411 Malignant neoplasm of upper-outer quadrant of right female breast: Secondary | ICD-10-CM | POA: Insufficient documentation

## 2021-07-24 DIAGNOSIS — Z5112 Encounter for antineoplastic immunotherapy: Secondary | ICD-10-CM | POA: Diagnosis present

## 2021-07-24 LAB — CMP (CANCER CENTER ONLY)
ALT: 16 U/L (ref 0–44)
AST: 19 U/L (ref 15–41)
Albumin: 3.8 g/dL (ref 3.5–5.0)
Alkaline Phosphatase: 107 U/L (ref 38–126)
Anion gap: 6 (ref 5–15)
BUN: 24 mg/dL — ABNORMAL HIGH (ref 6–20)
CO2: 26 mmol/L (ref 22–32)
Calcium: 8.5 mg/dL — ABNORMAL LOW (ref 8.9–10.3)
Chloride: 109 mmol/L (ref 98–111)
Creatinine: 0.87 mg/dL (ref 0.44–1.00)
GFR, Estimated: >60 mL/min (ref >60)
Glucose, Bld: 107 mg/dL — ABNORMAL HIGH (ref 70–99)
Potassium: 4.4 mmol/L (ref 3.5–5.1)
Sodium: 141 mmol/L (ref 135–145)
Total Bilirubin: 0.2 mg/dL — ABNORMAL LOW (ref 0.3–1.2)
Total Protein: 6.6 g/dL (ref 6.5–8.1)

## 2021-07-24 LAB — CBC WITH DIFFERENTIAL (CANCER CENTER ONLY)
Abs Immature Granulocytes: 0.04 10*3/uL (ref 0.00–0.07)
Basophils Absolute: 0 10*3/uL (ref 0.0–0.1)
Basophils Relative: 0 %
Eosinophils Absolute: 0.1 10*3/uL (ref 0.0–0.5)
Eosinophils Relative: 1 %
HCT: 33.1 % — ABNORMAL LOW (ref 36.0–46.0)
Hemoglobin: 10.5 g/dL — ABNORMAL LOW (ref 12.0–15.0)
Immature Granulocytes: 1 %
Lymphocytes Relative: 23 %
Lymphs Abs: 1.6 10*3/uL (ref 0.7–4.0)
MCH: 27.1 pg (ref 26.0–34.0)
MCHC: 31.7 g/dL (ref 30.0–36.0)
MCV: 85.3 fL (ref 80.0–100.0)
Monocytes Absolute: 0.5 10*3/uL (ref 0.1–1.0)
Monocytes Relative: 7 %
Neutro Abs: 4.7 10*3/uL (ref 1.7–7.7)
Neutrophils Relative %: 68 %
Platelet Count: 226 10*3/uL (ref 150–400)
RBC: 3.88 MIL/uL (ref 3.87–5.11)
RDW: 15.6 % — ABNORMAL HIGH (ref 11.5–15.5)
WBC Count: 6.9 10*3/uL (ref 4.0–10.5)
nRBC: 0 % (ref 0.0–0.2)

## 2021-07-24 MED ORDER — SODIUM CHLORIDE 0.9 % IV SOLN: Freq: Once | INTRAVENOUS | Status: AC

## 2021-07-24 MED ORDER — SODIUM CHLORIDE 0.9% FLUSH
10.0000 mL | Freq: Once | INTRAVENOUS | Status: AC
  Administered 2021-07-24: 10 mL

## 2021-07-24 MED ORDER — SODIUM CHLORIDE 0.9 % IV SOLN
420.0000 mg | Freq: Once | INTRAVENOUS | Status: AC
  Administered 2021-07-24: 420 mg via INTRAVENOUS
  Filled 2021-07-24: qty 14

## 2021-07-24 MED ORDER — TRASTUZUMAB-DKST CHEMO 150 MG IV SOLR
6.0000 mg/kg | Freq: Once | INTRAVENOUS | Status: AC
  Administered 2021-07-24: 546 mg via INTRAVENOUS
  Filled 2021-07-24: qty 26

## 2021-07-24 MED ORDER — DIPHENHYDRAMINE HCL 25 MG PO CAPS
25.0000 mg | ORAL_CAPSULE | Freq: Once | ORAL | Status: AC
  Administered 2021-07-24: 25 mg via ORAL
  Filled 2021-07-24: qty 1

## 2021-07-24 MED ORDER — ACETAMINOPHEN 325 MG PO TABS
650.0000 mg | ORAL_TABLET | Freq: Once | ORAL | Status: AC
  Administered 2021-07-24: 650 mg via ORAL
  Filled 2021-07-24: qty 2

## 2021-07-24 MED ORDER — SODIUM CHLORIDE 0.9% FLUSH
10.0000 mL | INTRAVENOUS | Status: DC | PRN
  Administered 2021-07-24: 10 mL

## 2021-07-24 MED ORDER — HEPARIN SOD (PORK) LOCK FLUSH 100 UNIT/ML IV SOLN
500.0000 [IU] | Freq: Once | INTRAVENOUS | Status: AC | PRN
  Administered 2021-07-24: 500 [IU]

## 2021-07-24 NOTE — Progress Notes (Signed)
Patient observed for 30 minutes following administration of Perjeta infusion. Vital signs retaken and remained stable. Patient showed no signs of distress upon discharge.  ?

## 2021-07-24 NOTE — Patient Instructions (Addendum)
[facility] ?Happy Last Treatment Day!   ?Discharge Instructions: ?Thank you for choosing [facility] to provide your oncology and hematology care.  ? ?If you have a lab appointment with the [facility], please go directly to the [facility] and check in at the registration area. ?  ?Wear comfortable clothing and clothing appropriate for easy access to any Portacath or PICC line.  ? ?We strive to give you quality time with your provider. You may need to reschedule your appointment if you arrive late (15 or more minutes).  Arriving late affects you and other patients whose appointments are after yours.  Also, if you miss three or more appointments without notifying the office, you may be dismissed from the clinic at the provider?s discretion.    ?  ?For prescription refill requests, have your pharmacy contact our office and allow 72 hours for refills to be completed.   ? ?Today you received the following chemotherapy and/or immunotherapy agents: Trastuzumab (Herceptin) and Pertuzumab (Perjeta)    ?  ?To help prevent nausea and vomiting after your treatment, we encourage you to take your nausea medication as directed. ? ?BELOW ARE SYMPTOMS THAT SHOULD BE REPORTED IMMEDIATELY: ?*FEVER GREATER THAN 100.4 F (38 ?C) OR HIGHER ?*CHILLS OR SWEATING ?*NAUSEA AND VOMITING THAT IS NOT CONTROLLED WITH YOUR NAUSEA MEDICATION ?*UNUSUAL SHORTNESS OF BREATH ?*UNUSUAL BRUISING OR BLEEDING ?*URINARY PROBLEMS (pain or burning when urinating, or frequent urination) ?*BOWEL PROBLEMS (unusual diarrhea, constipation, pain near the anus) ?TENDERNESS IN MOUTH AND THROAT WITH OR WITHOUT PRESENCE OF ULCERS (sore throat, sores in mouth, or a toothache) ?UNUSUAL RASH, SWELLING OR PAIN  ?UNUSUAL VAGINAL DISCHARGE OR ITCHING  ? ?Items with * indicate a potential emergency and should be followed up as soon as possible or go to the Emergency Department if any problems should occur. ? ?Please show the  CHEMOTHERAPY ALERT CARD or IMMUNOTHERAPY ALERT CARD at check-in to the Emergency Department and triage nurse. ? ?Should you have questions after your visit or need to cancel or reschedule your appointment, please contact [facility]  Dept: [phone]  and follow the prompts.  Office hours are 8:00 a.m. to 4:30 p.m. Monday - Friday. Please note that voicemails left after 4:00 p.m. may not be returned until the following business day.  We are closed weekends and major holidays. You have access to a nurse at all times for urgent questions. Please call the main number to the clinic Dept: [phone] and follow the prompts. ? ? ?For any non-urgent questions, you may also contact your provider using MyChart. We now offer e-Visits for anyone 94 and older to request care online for non-urgent symptoms. For details visit [email or web address]. ?  ?Also download the MyChart app! Go to the app store, search "MyChart", open the app, select [facility], and log in with your MyChart username and password. ? ?Due to Covid, a mask is required upon entering the hospital/clinic. If you do not have a mask, one will be given to you upon arrival. For doctor visits, patients may have 1 support person aged 73 or older with them. For treatment visits, patients cannot have anyone with them due to current Covid guidelines and our immunocompromised population.  ? ?

## 2021-07-25 LAB — PROTEIN ELECTROPHORESIS, SERUM, WITH REFLEX
A/G Ratio: 1.2 (ref 0.7–1.7)
Albumin ELP: 3.4 g/dL (ref 2.9–4.4)
Alpha-1-Globulin: 0.2 g/dL (ref 0.0–0.4)
Alpha-2-Globulin: 0.6 g/dL (ref 0.4–1.0)
Beta Globulin: 1.1 g/dL (ref 0.7–1.3)
Gamma Globulin: 1 g/dL (ref 0.4–1.8)
Globulin, Total: 2.8 g/dL (ref 2.2–3.9)
Total Protein ELP: 6.2 g/dL (ref 6.0–8.5)

## 2021-07-28 ENCOUNTER — Other Ambulatory Visit: Payer: Self-pay | Admitting: *Deleted

## 2021-07-28 DIAGNOSIS — K1230 Oral mucositis (ulcerative), unspecified: Secondary | ICD-10-CM

## 2021-07-28 MED ORDER — VALACYCLOVIR HCL 1 G PO TABS: 1000.0000 mg | ORAL_TABLET | Freq: Two times a day (BID) | ORAL | 2 refills | Status: DC

## 2021-07-30 ENCOUNTER — Encounter (HOSPITAL_COMMUNITY): Payer: Self-pay

## 2021-08-01 ENCOUNTER — Encounter: Payer: BC Managed Care – PPO | Admitting: Rehabilitation

## 2021-08-01 ENCOUNTER — Other Ambulatory Visit: Payer: Self-pay | Admitting: *Deleted

## 2021-08-01 MED ORDER — METHYLPREDNISOLONE 4 MG PO TBPK: ORAL_TABLET | ORAL | 0 refills | Status: DC

## 2021-08-01 NOTE — Progress Notes (Signed)
Received call from pt with complaint of flare up of facial rash.  Pt states rash is very itchy, warm and red.  Per MD rash r/t recent herceptin/perjeta infusion.  Verbal orders received from MD for pt to receive Medrol dosepak.  Prescription sent to pharmacy on file.  Pt educated and verbalized understanding.  ?

## 2021-08-07 ENCOUNTER — Encounter: Payer: Self-pay | Admitting: Rehabilitation

## 2021-08-07 ENCOUNTER — Ambulatory Visit: Payer: BC Managed Care – PPO | Admitting: Rehabilitation

## 2021-08-07 DIAGNOSIS — Z483 Aftercare following surgery for neoplasm: Secondary | ICD-10-CM

## 2021-08-07 DIAGNOSIS — R293 Abnormal posture: Secondary | ICD-10-CM

## 2021-08-07 DIAGNOSIS — I890 Lymphedema, not elsewhere classified: Secondary | ICD-10-CM

## 2021-08-07 DIAGNOSIS — C50411 Malignant neoplasm of upper-outer quadrant of right female breast: Secondary | ICD-10-CM

## 2021-08-07 NOTE — Therapy (Signed)
?OUTPATIENT PHYSICAL THERAPY ONCOLOGY Treatment ? ?Patient Name: Sandy Goodwin ?MRN: [id number] ?DOB:[birth year], 51 y.o., female ?Today's Date: 08/07/2021 ? ? PT End of Session - 08/07/21 1100   ? ? Visit Number 7   ? Number of Visits 9   ? Date for PT Re-Evaluation 07/31/21   ? PT Start Time 1100   ? PT Stop Time 1130   no sig need for MLD today  ? PT Time Calculation (min) 30 min   ? Activity Tolerance Patient tolerated treatment well   ? Behavior During Therapy [facility] for tasks assessed/performed   ? ?  ?  ? ?  ? ? ?Past Medical History:  ?Diagnosis Date  ? Arthritis   ? Breast cancer ([place])   ? Complication of anesthesia   ? Itching  ? Dysrhythmia   ? Tachycardia  ? Hypothyroidism   ? ?Past Surgical History:  ?Procedure Laterality Date  ? ABDOMINAL HYSTERECTOMY    ? APPENDECTOMY  2008  ? Bowel Rupture Repair  2008  ? Ruptured during appendectomy  ? Breast Biopsy Right 1999  ? BREAST LUMPECTOMY WITH RADIOACTIVE SEED AND SENTINEL LYMPH NODE BIOPSY Right 12/11/2020  ? Procedure: RIGHT BREAST LUMPECTOMY WITH RADIOACTIVE SEED X2 AND RIGHT AXILLARY SENTINEL LYMPH NODE BIOPSY;  Surgeon: Rolm Bookbinder, MD;  Location: [facility];  Service: General;  Laterality: Right;  ? BREAST RECONSTRUCTION Right 12/20/2020  ? Procedure: RIGHT ONCOPLASTIC RECONSTRUCTION;  Surgeon: Irene Limbo, MD;  Location: [facility];  Service: Plastics;  Laterality: Right;  ? BREAST REDUCTION SURGERY Left 12/20/2020  ? Procedure: LEFT BREAST REDUCTION;  Surgeon: Irene Limbo, MD;  Location: [facility];  Service: Plastics;  Laterality: Left;  ? COLONOSCOPY N/A 06/15/2016  ? Procedure: COLONOSCOPY;  Surgeon: Rogene Houston, MD;  [facility];  Service: Endoscopy;  Laterality: N/A;  845  ? IBF  [phone]  ? INCISION AND DRAINAGE OF WOUND Right 01/10/2021  ? Procedure: INCISION AND DRAINAGE OF RIGHT AXILLA;  Surgeon: Irene Limbo, MD;  Location: [facility];  Service: Plastics;   Laterality: Right;  ? PORTACATH PLACEMENT Left 07/23/2020  ? Procedure: INSERTION PORT-A-CATH;  Surgeon: Rolm Bookbinder, MD;  Location: [facility];  Service: General;  Laterality: Left;  ? THYROIDECTOMY, PARTIAL Bilateral 2012  ? ?Patient Active Problem List  ? Diagnosis Date Noted  ? Uterine prolapse 03/20/2021  ? Polyarthralgia 03/20/2021  ? Port-A-Cath in place 02/06/2021  ? Genetic testing 07/17/2020  ? Malignant neoplasm of upper-outer quadrant of right breast in female, estrogen receptor positive ([place]) 07/12/2020  ? Change in bowel habits 06/12/2016  ? Mixed hyperlipidemia 01/07/2016  ? Hypothyroidism, unspecified 01/07/2016  ? Allergic rhinitis 04/02/2015  ? 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 ? ?PCP: Caryl Bis, MD ? ?REFERRING PROVIDER: Rolm Bookbinder, MD ? ?REFERRING DIAG: Rt breast lymphedema ? ?THERAPY DIAG:  ?Aftercare following surgery for neoplasm ? ?Malignant neoplasm of upper-outer quadrant of right breast in female, estrogen receptor positive ([place]) ? ?Abnormal posture ? ?Lymphedema, not elsewhere classified ? ?ONSET DATE: 03/25/21 ? ?SUBJECTIVE                                                                                                                                                                                          ? ?  SUBJECTIVE STATEMENT: Feeling very good lately ?  ? 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 There are 4.1 cm of calcifications.with a Ki67 of 25%; pt complete chemo in Aug 2022, 12/11/20 - R breast lumpectomy and SLNB (0/3), 12/20/20- oncoplasty on R and breast reduction on the L, 9/30 pt had to have another surgery to drain the R axilla which had an absess, radiation completed ? ?PAIN:  ?Are you having pain? No ?Intermittent shooting pains in the breast and more pain by the end of the day  ? ?PRECAUTIONS: Other: lymphedema risk Rt UE ? ?PATIENT GOALS  improve the breast discomfort  ? ? ?OBJECTIVE ?TODAY'S TREATMENT  ?08/07/21 ?With pt permission: In supine: Short neck, 5 diaphragmatic breaths, superficial and deep abdominals,  bil axillary nodes and establishment of interaxillary pathway, Rt inguinal nodes and establishment of axilloinguinal pathway, then Rt breast moving fluid towards pathways. Scar tissue mobilization along inferior incision using cocoa butter using gentle pressure with cross wise/circles/and pressure away in both directions ?Then in sidelying STM / release lateral trunk assessed today but not needed ? ?07/17/21 ?With pt permission: In supine: Short neck, 5 diaphragmatic breaths, superficial and deep abdominals,  bil axillary nodes and establishment of interaxillary pathway, Rt inguinal nodes and establishment of axilloinguinal pathway, then Rt breast moving fluid towards pathways spending extra time in any areas of fibrosis then retracing all steps.  Scar tissue mobilization along inferior incision using cocoa butter using gentle pressure with cross wise/circles/and pressure away in both directions ?Then in sidelying STM / release lateral trunk and MLD towards posterior interaxillary ? ?07/11/21 ?Pts husband scott educated on lympathic anatomy and physiology and basics of MLD.  Started with scar tissue education on inferior breast scar mostly lateral with crosswise, circles and pressure away - vcs to decrease pressure but overall did very well.  ?With pt permission: In supine: Short neck, 5 diaphragmatic breaths, superficial and deep abdominals,  bil axillary nodes and establishment of interaxillary pathway, Rt inguinal nodes and establishment of axilloinguinal pathway, then Rt breast moving fluid towards pathways spending extra time in any areas of fibrosis then retracing all steps.  Then reviewed all steps with spouse performing each.  Pt needing reminders and tcs/vcs for hand placement and lighter pressure. Then PT performed more with remaining  time.  ?Scar tissue mobilization along inferior incision using cocoa butter using gentle pressure with cross wise/circles/and pressure away in both directions ? ?PATIENT EDUCATION:  ?Education details: self MLD, compression bra, POC ?Person educated: Patient ?Education method: Explanation, Demonstration, Tactile cues, Verbal cues, and Handouts ?Education comprehension: verbalized understanding, returned demonstration, verbal cues required, tactile cues required, and needs further education ? ? ?HOME EXERCISE PROGRAM: ?Self MLD ? ?ASSESSMENT: ? ?CLINICAL IMPRESSION: Pt reporting no breast complaints recently and that she and her husband are doing very well with self MLD and scar massage.  Scar and lateral breast feels free and soft today.  Pt has no further questions on self MLD and all goals are met.  Will continue with [facility].  ? ?OBJECTIVE IMPAIRMENTS decreased knowledge of use of DME and increased edema.  ? ?ACTIVITY LIMITATIONS  none .  ? ?PERSONAL FACTORS  radiation hx, SLNB  are also affecting patient's functional outcome.  ? ? ?REHAB POTENTIAL: Excellent ? ?CLINICAL DECISION MAKING: Stable/uncomplicated ? ?EVALUATION COMPLEXITY: Low ? ?GOALS: ?Goals reviewed with patient? Yes ? ?LONG TERM GOALS: ? ?Pt will be independent in self MLD   for the Rt breast ?Baseline:  ?Target date: 09/18/2021 ?Goal status: MET ? ?2.  Pt will obtain appropriate compression bra for edema management ?Baseline:  ?Target date: 09/18/2021 ?Goal status: MET ? ?3.  Pt will decrease breast edema questionnaire to 18 or less to demonstrate improvement in edema severity ?Baseline:   ?Target date: 09/18/2021 ?Goal status: MET ? ? ?PLAN: ?PT FREQUENCY: 1-2x/week ? ?PT DURATION: 6 weeks ? ?PLANNED INTERVENTIONS: Therapeutic exercises, Patient/Family education, Manual lymph drainage, Taping, and Manual therapy ? ?PLAN FOR NEXT SESSION:  Perform Rt breast MLD with review of steps as needed, scar tissue work ? ? ?Tevis, Kara R, PT ?08/07/2021, 11:56 AM ? ?

## 2021-08-22 ENCOUNTER — Encounter: Payer: Self-pay | Admitting: Hematology and Oncology

## 2021-08-22 ENCOUNTER — Encounter: Payer: Self-pay | Admitting: Adult Health

## 2021-08-22 ENCOUNTER — Other Ambulatory Visit (HOSPITAL_COMMUNITY): Payer: Self-pay

## 2021-08-22 ENCOUNTER — Other Ambulatory Visit: Payer: Self-pay

## 2021-08-22 ENCOUNTER — Inpatient Hospital Stay: Payer: BC Managed Care – PPO | Attending: Hematology and Oncology | Admitting: Adult Health

## 2021-08-22 ENCOUNTER — Inpatient Hospital Stay: Payer: BC Managed Care – PPO

## 2021-08-22 VITALS — BP 120/77 | HR 67 | Temp 97.5°F | Resp 15 | Wt 205.6 lb

## 2021-08-22 DIAGNOSIS — Z170 Estrogen receptor positive status [ER+]: Secondary | ICD-10-CM | POA: Diagnosis not present

## 2021-08-22 DIAGNOSIS — Z79899 Other long term (current) drug therapy: Secondary | ICD-10-CM | POA: Diagnosis not present

## 2021-08-22 DIAGNOSIS — Z79811 Long term (current) use of aromatase inhibitors: Secondary | ICD-10-CM | POA: Insufficient documentation

## 2021-08-22 DIAGNOSIS — N898 Other specified noninflammatory disorders of vagina: Secondary | ICD-10-CM | POA: Insufficient documentation

## 2021-08-22 DIAGNOSIS — Z95828 Presence of other vascular implants and grafts: Secondary | ICD-10-CM

## 2021-08-22 DIAGNOSIS — Z923 Personal history of irradiation: Secondary | ICD-10-CM | POA: Insufficient documentation

## 2021-08-22 DIAGNOSIS — Z9221 Personal history of antineoplastic chemotherapy: Secondary | ICD-10-CM | POA: Diagnosis not present

## 2021-08-22 DIAGNOSIS — C50411 Malignant neoplasm of upper-outer quadrant of right female breast: Secondary | ICD-10-CM | POA: Diagnosis present

## 2021-08-22 MED ORDER — SODIUM CHLORIDE 0.9% FLUSH
10.0000 mL | Freq: Once | INTRAVENOUS | Status: AC
  Administered 2021-08-22: 10 mL

## 2021-08-22 MED ORDER — HEPARIN SOD (PORK) LOCK FLUSH 100 UNIT/ML IV SOLN
500.0000 [IU] | Freq: Once | INTRAVENOUS | Status: AC
  Administered 2021-08-22: 500 [IU]

## 2021-08-22 NOTE — Progress Notes (Signed)
SURVIVORSHIP VISIT:  BRIEF ONCOLOGIC HISTORY:  Oncology History  Malignant neoplasm of upper-outer quadrant of right breast in female, estrogen receptor positive ([place])  03/23/2018 Genetic Testing   [facility] Genetic Testing Results: Negative, with a variant of uncertain significance in POLE.  Genes Analyzed: APC, ATM, AXIN2, BARD1, BMPR1A, BRCA1, BRCA2, BRIP1, CDH1, CDK4, CDKN2A, CHEK2, EPCAM (large rearrangement only), HOXB13 (sequencing only), GALNT12, MLH1, MSH2, MSH3, MSH6, MUTYH, NBN, NTHL1, PALB2, PMS2, PTEN, RAD51C, RAD51D, RNF43, RPS20, SMAD4, STK11, TP52. Sequencing was performed for select regions of POLE and POLD1, and large rearrangement analysis was performed for select regions of GERM1.   07/08/2020 Initial Diagnosis   Screening mammogram showed right breast calcifications. Diagnostic mammogram showed right breast calcifications spanning 4.1cm and no abnormal right axillary lymph nodes. 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   07/24/2020 - 11/15/2020 Neo-Adjuvant Chemotherapy   Taxotere, Carbo, Herceptin, Perjeta given every three weeks x 6   12/05/2020 - 07/24/2021 Chemotherapy   Maintenance Herceptin/Perjeta every three weeks for one year       12/11/2020 Surgery   Right breast lumpectomy [name]): Scattered foci of high-grade DCIS with calcifications, no invasive cancer, pathologic complete response, margins negative, 0/3 lymph nodes negative, previously ER 80%, PR 40%, HER2 positive, Ki-67 25%   12/11/2020 Cancer Staging   Staging form: Breast, AJCC 8th Edition - Pathologic stage from 12/11/2020: No Stage Recommended (ypT0, pN0, cM0) - Signed by Gardenia Phlegm, NP on 03/19/2021 Stage prefix: Post-therapy    12/20/2020 Surgery   Reconstruction with Dr. Iran Planas   02/04/2021 - 03/25/2021 Radiation Therapy   Adjuvant radiation     INTERVAL HISTORY:  Sandy Goodwin to review her  survivorship care plan detailing her treatment course for breast cancer, as well as monitoring long-term side effects of that treatment, education regarding health maintenance, screening, and overall wellness and health promotion.     Overall, Sandy Goodwin reports feeling quite well.  She is having her port removed in a few weeks and would like to have it flushed today.  She is taking anastrozole daily with good tolerance with the exception of vaginal dryness she is struggling with.  She is working and is doing well with this transition.  REVIEW OF SYSTEMS:  Review of Systems  Constitutional:  Negative for appetite change, chills, fatigue, fever and unexpected weight change.  HENT:   Negative for hearing loss, lump/mass and trouble swallowing.   Eyes:  Negative for eye problems and icterus.  Respiratory:  Negative for chest tightness, cough and shortness of breath.   Cardiovascular:  Negative for chest pain, leg swelling and palpitations.  Gastrointestinal:  Negative for abdominal distention, abdominal pain, constipation, diarrhea, nausea and vomiting.  Endocrine: Negative for hot flashes.  Genitourinary:  Negative for difficulty urinating.   Musculoskeletal:  Negative for arthralgias.  Skin:  Negative for itching and rash.  Neurological:  Negative for dizziness, extremity weakness, headaches and numbness.  Hematological:  Negative for adenopathy. Does not bruise/bleed easily.  Psychiatric/Behavioral:  Negative for depression. The patient is not nervous/anxious.   Breast: Denies any new nodularity, masses, tenderness, nipple changes, or nipple discharge.    ONCOLOGY TREATMENT TEAM:  1. Surgeon:  Dr. [name] at [facility] 2. Medical Oncologist: Dr. Lindi Adie  3. Radiation Oncologist: Dr. Lisbeth Renshaw    PAST MEDICAL/SURGICAL HISTORY:  Past Medical History:  Diagnosis Date   Arthritis    Breast cancer ([place])    Complication  of anesthesia    Itching   Dysrhythmia    Tachycardia    Hypothyroidism    Past Surgical History:  Procedure Laterality Date   ABDOMINAL HYSTERECTOMY     APPENDECTOMY  2008   Bowel Rupture Repair  2008   Ruptured during appendectomy   Breast Biopsy Right 1999   BREAST LUMPECTOMY WITH RADIOACTIVE SEED AND SENTINEL LYMPH NODE BIOPSY Right 12/11/2020   Procedure: RIGHT BREAST LUMPECTOMY WITH RADIOACTIVE SEED X2 AND RIGHT AXILLARY SENTINEL LYMPH NODE BIOPSY;  Surgeon: Emelia Loron, MD;  Location: [facility];  Service: General;  Laterality: Right;   BREAST RECONSTRUCTION Right 12/20/2020   Procedure: RIGHT ONCOPLASTIC RECONSTRUCTION;  Surgeon: Glenna Fellows, MD;  Location: [facility];  Service: Plastics;  Laterality: Right;   BREAST REDUCTION SURGERY Left 12/20/2020   Procedure: LEFT BREAST REDUCTION;  Surgeon: Glenna Fellows, MD;  Location: [facility];  Service: Plastics;  Laterality: Left;   COLONOSCOPY N/A 06/15/2016   Procedure: COLONOSCOPY;  Surgeon: Malissa Hippo, MD;  [facility];  Service: Endoscopy;  Laterality: N/A;  845   IBF  [id number]   INCISION AND DRAINAGE OF WOUND Right 01/10/2021   Procedure: INCISION AND DRAINAGE OF RIGHT AXILLA;  Surgeon: Glenna Fellows, MD;  Location: [facility];  Service: Plastics;  Laterality: Right;   PORTACATH PLACEMENT Left 07/23/2020   Procedure: INSERTION PORT-A-CATH;  Surgeon: Emelia Loron, MD;  Location: [facility];  Service: General;  Laterality: Left;   THYROIDECTOMY, PARTIAL Bilateral 2012     ALLERGIES:  Allergies  Allergen Reactions   Codeine Other (See Comments)    Makes pt agittated and stay awake    Hydrocodone Other (See Comments)    Makes pt agittated and stay awake      CURRENT MEDICATIONS:  Outpatient Encounter Medications as of 08/22/2021  Medication Sig Note   anastrozole (ARIMIDEX) 1 MG tablet Take 1 tablet (1 mg total) by mouth daily.    BYSTOLIC 5 MG tablet Take 1 tablet by  mouth daily.    cetirizine (ZYRTEC) 10 MG tablet Take 10 mg by mouth daily.    clobetasol ointment (TEMOVATE) 0.05 % Apply topically 2 (two) times daily. 05/05/2021: Uses prn   levothyroxine (SYNTHROID, LEVOTHROID) 50 MCG tablet Take 50 mcg by mouth daily before breakfast.    pantoprazole (PROTONIX) 40 MG tablet Take 40 mg by mouth daily.    triamcinolone (NASACORT) 55 MCG/ACT AERO nasal inhaler Place 2 sprays into the nose daily. Pt uses during allergy season    lidocaine-prilocaine (EMLA) cream APPLY TO THE AFFECTED AREA(S) DAILY (Patient not taking: Reported on 08/22/2021)    LORazepam (ATIVAN) 0.5 MG tablet Take 1 tablet (0.5mg  tablet) at bedtime, as needed for nausea. (Patient not taking: Reported on 08/22/2021)    metroNIDAZOLE (METROCREAM) 0.75 % cream Apply 1 application topically 2 (two) times daily. (Patient not taking: Reported on 08/22/2021) 05/05/2021: Uses prn   Turmeric 500 MG CAPS Take by mouth. (Patient not taking: Reported on 08/22/2021)    valACYclovir (VALTREX) 1000 MG tablet Take 1 tablet (1,000 mg total) by mouth 2 (two) times daily. (Patient not taking: Reported on 08/22/2021)    [DISCONTINUED] methylPREDNISolone (MEDROL DOSEPAK) 4 MG TBPK tablet Take as directed (Patient not taking: Reported on 08/22/2021)    No facility-administered encounter medications on file as of 08/22/2021.     ONCOLOGIC FAMILY HISTORY:  Family History  Problem Relation Age of Onset   Hypertension Mother  Breast cancer Mother    Lymphoma Mother    Hypertension Brother    Breast cancer Maternal Aunt    Lymphoma Maternal Grandfather    Breast cancer Maternal Aunt      GENETIC COUNSELING/TESTING: See above  SOCIAL HISTORY:  Social History   Socioeconomic History   Marital status: Married    Spouse name: Not on file   Number of children: Not on file   Years of education: Not on file   Highest education level: Not on file  Occupational History   Not on file  Tobacco Use   Smoking  status: Never   Smokeless tobacco: Never  Vaping Use   Vaping Use: Never used  Substance and Sexual Activity   Alcohol use: No   Drug use: No   Sexual activity: Yes  Other Topics Concern   Not on file  Social History Narrative   Not on file   Social Determinants of Health   Financial Resource Strain: Not on file  Food Insecurity: Not on file  Transportation Needs: Not on file  Physical Activity: Not on file  Stress: Not on file  Social Connections: Not on file  Intimate Partner Violence: Not on file     OBSERVATIONS/OBJECTIVE:  BP 120/77 (BP Location: Left Arm, Patient Position: Sitting)   Pulse 67   Temp (!) 97.5 F (36.4 C) (Temporal)   Resp 15   Wt 205 lb 9.6 oz (93.3 kg)   SpO2 99%   BMI 33.18 kg/m  GENERAL: Patient is a well appearing female in no acute distress HEENT:  Sclerae anicteric.  Oropharynx clear and moist. No ulcerations or evidence of oropharyngeal candidiasis. Neck is supple.  NODES:  No cervical, supraclavicular, or axillary lymphadenopathy palpated.  BREAST EXAM:  right breast s/p lumpectomy and radiation, no sign of local recurrence, left breast benign LUNGS:  Clear to auscultation bilaterally.  No wheezes or rhonchi. HEART:  Regular rate and rhythm. No murmur appreciated. ABDOMEN:  Soft, nontender.  Positive, normoactive bowel sounds. No organomegaly palpated. MSK:  No focal spinal tenderness to palpation. Full range of motion bilaterally in the upper extremities. EXTREMITIES:  No peripheral edema.   SKIN:  Clear with no obvious rashes or skin changes. No nail dyscrasia. NEURO:  Nonfocal. Well oriented.  Appropriate affect.   LABORATORY DATA:  None for this visit.  DIAGNOSTIC IMAGING:  None for this visit.      ASSESSMENT AND PLAN:  Ms.. Goodwin is a pleasant 51 y.o. female with Stage IB right breast invasive ductal carcinoma, ER+/PR+/HER2+, diagnosed in 07/2020, treated with  neoadjuvant chemotherapy, lumpectomy, maintenance  trastuzumab/pertuzumab, adjuvant radiation therapy, and anti-estrogen therapy with Anastrozole beginning in 06/2021.  She presents to the Survivorship Clinic for our initial meeting and routine follow-up post-completion of treatment for breast cancer.    1. Stage IB right breast cancer:  Sandy Goodwin is continuing to recover from definitive treatment for breast cancer. She will follow-up with her medical oncologist, Dr. Lindi Adie in 12/2021 with history and physical exam per surveillance protocol.  She will continue her anti-estrogen therapy with Anastrozole. Thus far, she is tolerating the anastrozole well, with minimal side effects. She was instructed to make Dr. Lindi Adie or myself aware if she begins to experience any worsening side effects of the medication and I could see her back in clinic to help manage those side effects, as needed. Her mammogram is due 06/2022, breast MRI is recommended and ordered to be completed in 12/2021.  Today, a comprehensive  survivorship care plan and treatment summary was reviewed with the patient today detailing her breast cancer diagnosis, treatment course, potential late/long-term effects of treatment, appropriate follow-up care with recommendations for the future, and patient education resources.  A copy of this summary, along with a letter will be sent to the patient's primary care provider via mail/fax/In Basket message after today's visit.    2. Vaginal Dryness: This is secondary to Anastrozole. I placed a referral to PT, pelvic rehab at [facility] for evaluation and discussion.   3. Bone health:  Given Sandy Goodwin's age/history of breast cancer and her current treatment regimen including anti-estrogen therapy with Anastrozole, she is at risk for bone demineralization.  Baseline DEXA testing is scheduled for 12/2021.  She was given education on specific activities to promote bone health.  4. Cancer screening:  Due to Sandy Goodwin's history and her age, she should receive screening for  skin cancers, colon cancer, and gynecologic cancers.  The information and recommendations are listed on the patient's comprehensive care plan/treatment summary and were reviewed in detail with the patient.    5. Health maintenance and wellness promotion: Sandy Goodwin was encouraged to consume 5-7 servings of fruits and vegetables per day. We reviewed the "Nutrition Rainbow" handout.  She was also encouraged to engage in moderate to vigorous exercise for 30 minutes per day most days of the week. We discussed the [employer] fitness program, which is designed for cancer survivors to help them become more physically fit after cancer treatments.  She was instructed to limit her alcohol consumption and continue to abstain from tobacco use.     6. Support services/counseling: It is not uncommon for this period of the patient's cancer care trajectory to be one of many emotions and stressors. She was given information regarding our available services and encouraged to contact me with any questions or for help enrolling in any of our support group/programs.    Follow up instructions:    -Return to cancer center 12/2021  -Mammogram due in 06/2022 -MRI 12/2021 -Follow up with surgery 06/2022 -She is welcome to return back to the Survivorship Clinic at any time; no additional follow-up needed at this time.  -Consider referral back to survivorship as a long-term survivor for continued surveillance  The patient was provided an opportunity to ask questions and all were answered. The patient agreed with the plan and demonstrated an understanding of the instructions.   Total encounter time:45 minutes*in face-to-face visit time, chart review, lab review, care coordination, order entry, and documentation of the encounter time.  Wilber Bihari, NP 08/31/21 7:13 AM Medical Oncology and Hematology [facility] [place], [place] [address] Tel. [phone]    Fax. [phone]  *Total  Encounter Time as defined by the Centers for Medicare and Medicaid Services includes, in addition to the face-to-face time of a patient visit (documented in the note above) non-face-to-face time: obtaining and reviewing outside history, ordering and reviewing medications, tests or procedures, care coordination (communications with other health care professionals or caregivers) and documentation in the medical record.

## 2021-09-01 ENCOUNTER — Ambulatory Visit: Payer: BC Managed Care – PPO | Attending: Adult Health

## 2021-09-01 DIAGNOSIS — R279 Unspecified lack of coordination: Secondary | ICD-10-CM | POA: Insufficient documentation

## 2021-09-01 DIAGNOSIS — M62838 Other muscle spasm: Secondary | ICD-10-CM | POA: Diagnosis present

## 2021-09-01 DIAGNOSIS — C50411 Malignant neoplasm of upper-outer quadrant of right female breast: Secondary | ICD-10-CM | POA: Insufficient documentation

## 2021-09-01 DIAGNOSIS — Z170 Estrogen receptor positive status [ER+]: Secondary | ICD-10-CM | POA: Diagnosis present

## 2021-09-01 DIAGNOSIS — M6281 Muscle weakness (generalized): Secondary | ICD-10-CM | POA: Diagnosis present

## 2021-09-01 NOTE — Patient Instructions (Addendum)
Squatty potty: When your knees are level or below the level of your hips, pelvic floor muscles are pressed against rectum, preventing ease of bowel movement. By getting knees above the level of the hips, these pelvic floor muscles relax, allowing easier passage of bowel movement. ? Ways to get knees above hips: o Squatty Potty (7inch and 9inch versions) o Small stool o Roll of toilet paper under each foot o Hardback book or stack of magazines under each foot  Relaxed Toileting mechanics: Once in this position, make sure to lean forward with forearms on thighs, wide knees, relaxed stomach, and breathe.  Double-voiding:  This technique is to help with post-void dribbling, or leaking a little bit when you stand up right after urinating.  Use relaxed toileting mechanics to urinate as much as you feel like you have to without straining.  Sit back upright from leaning forward and relax this way for 10-20 seconds.  Lean forward again to finish voiding any amount more.  Vulvar/vaginal Massage: This is a technique to help decrease painful sensitivity in the vaginal area. It can also help to restore normal moisture levels in the vaginal tissues. With V-magic, coconut oil, aloe, jojoba oil, or a specific vaginal moisturizer, gently massage into vaginal tissues. Think of this as part of your post-shower routine and moisturizing just like you would the rest of the body with lotion. This helps to increase good blood flow to the vaginal tissues. In addition, it also teaches the body that touch to the vagina does not have to be painful or threatening, but moisturizing and gentle.

## 2021-09-01 NOTE — Therapy (Signed)
OUTPATIENT PHYSICAL THERAPY FEMALE PELVIC EVALUATION   Patient Name: ANALIZ TVEDT MRN: [id number] DOB:1970/09/03, 51 y.o., female Today's Date: 09/01/2021   PT End of Session - 09/01/21 1229     Visit Number 1    Number of Visits --    Date for PT Re-Evaluation 11/24/21    Authorization Type BCBS    PT Start Time 1145    PT Stop Time 1227    PT Time Calculation (min) 42 min    Activity Tolerance Patient tolerated treatment well    Behavior During Therapy [facility] for tasks assessed/performed             Past Medical History:  Diagnosis Date   Arthritis    Breast cancer ([place])    Complication of anesthesia    Itching   Dysrhythmia    Tachycardia   Hypothyroidism    Past Surgical History:  Procedure Laterality Date   ABDOMINAL HYSTERECTOMY     APPENDECTOMY  2008   Bowel Rupture Repair  2008   Ruptured during appendectomy   Breast Biopsy Right 1999   BREAST LUMPECTOMY WITH RADIOACTIVE SEED AND SENTINEL LYMPH NODE BIOPSY Right 12/11/2020   Procedure: RIGHT BREAST LUMPECTOMY WITH RADIOACTIVE SEED X2 AND RIGHT AXILLARY SENTINEL LYMPH NODE BIOPSY;  Surgeon: Rolm Bookbinder, MD;  Location: [place];  Service: General;  Laterality: Right;   BREAST RECONSTRUCTION Right 12/20/2020   Procedure: RIGHT ONCOPLASTIC RECONSTRUCTION;  Surgeon: Irene Limbo, MD;  Location: [place];  Service: Plastics;  Laterality: Right;   BREAST REDUCTION SURGERY Left 12/20/2020   Procedure: LEFT BREAST REDUCTION;  Surgeon: Irene Limbo, MD;  Location: [place];  Service: Plastics;  Laterality: Left;   COLONOSCOPY N/A 06/15/2016   Procedure: COLONOSCOPY;  Surgeon: Rogene Houston, MD;  [facility];  Service: Endoscopy;  Laterality: N/A;  845   IBF  [id number]   INCISION AND DRAINAGE OF WOUND Right 01/10/2021   Procedure: INCISION AND DRAINAGE OF RIGHT AXILLA;  Surgeon: Irene Limbo, MD;  Location: [place];  Service:  Plastics;  Laterality: Right;   PORTACATH PLACEMENT Left 07/23/2020   Procedure: INSERTION PORT-A-CATH;  Surgeon: Rolm Bookbinder, MD;  Location: [place];  Service: General;  Laterality: Left;   THYROIDECTOMY, PARTIAL Bilateral 2012   Patient Active Problem List   Diagnosis Date Noted   Uterine prolapse 03/20/2021   Polyarthralgia 03/20/2021   Port-A-Cath in place 02/06/2021   Genetic testing 07/17/2020   Malignant neoplasm of upper-outer quadrant of right breast in female, estrogen receptor positive ([place]) 07/12/2020   Change in bowel habits 06/12/2016   Mixed hyperlipidemia 01/07/2016   Hypothyroidism, unspecified 01/07/2016   Allergic rhinitis 04/02/2015   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    PCP: Caryl Bis, MD  REFERRING PROVIDER: Gardenia Phlegm, NP  REFERRING DIAG: [phone] (ICD-10-CM) - Malignant neoplasm of upper-outer quadrant of right breast in female, estrogen receptor positive ([place])  THERAPY DIAG:  Muscle weakness (generalized)  Malignant neoplasm of upper-outer quadrant of right breast in female, estrogen receptor positive ([place])  Unspecified lack of coordination  Other muscle spasm  Rationale for Evaluation and Treatment Rehabilitation  ONSET DATE: 07/08/21  SUBJECTIVE:  SUBJECTIVE STATEMENT: Patient states that she has vaginal dryness. She also had uterine prolapse that she had hysterectomy for in addition to bladder tack that failed. She is done with all cancer treatment except for hormone blockers that she is still taking. She is seeing new urogynecologist in July.  Fluid intake: Yes: a lot of water    Patient confirms identification and approves PT to assess pelvic floor and treatment Yes   PAIN:  Are you having pain?  No   PRECAUTIONS: None  WEIGHT BEARING RESTRICTIONS No  FALLS:  Has patient fallen in last 6 months? 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  PLOF: Independent  PATIENT GOALS improving  PERTINENT HISTORY:  Breast cancer, abdominal hysterectomy, appendectomy, bowel rupture repair, lichens simplex Sexual abuse: No  BOWEL MOVEMENT Pain with bowel movement: No Type of bowel movement:Frequency 1x/day with Miralax and Strain Yes Takes Miralax most days  Fully empty rectum: Yes: - Leakage: Yes: occasional, unsure of what causes it, started after posterior grade 3 year Pads: No Fiber supplement: No  URINATION Pain with urination: No Fully empty bladder: No Stream:  WNL Urgency: Yes: no time to several minutes Frequency: every couple of hours - when at work much less Leakage: Urge to void, Walking to the bathroom, Coughing, Sneezing, and Laughing - also post-void dribble Pads: No - tries not too due to skin irritation  INTERCOURSE Pain with intercourse:  neuroma for a year after first delivery - no pain since until cancer treatment. Not currently attempting Ability to have vaginal penetration:  No Climax: Not attempted since cancer treatment   PREGNANCY Vaginal deliveries 2 Tearing Yes: Grade 3 and an anterior tear with second delivery C-section deliveries 0 Currently pregnant No  PROLAPSE She does have pessary,but is not using due to discomfort with dryness. She does feel vaginal bulge.    OBJECTIVE:   COGNITION:  Overall cognitive status: Within functional limits for tasks assessed     SENSATION:  Light touch: Appears intact  Proprioception: Appears intact  GAIT:  Comments: WNL  POSTURE:  WNL  PELVIC MMT:  Strength 1/5, no endurance        PALPATION:   General  no abdominal tenderness; restriction throughout bil lower abdomen                External Perineal Exam  vulvar/clitoral hood changes consistent with decreased estrogen                             Internal Pelvic Floor perineal scar tissue restriction/tenderness, restricted urethral mobility Rt, tenderness throughout superficial/deep pelvic floor   TONE: Pelvic floor atrophy  PROLAPSE: Grade 2 anterior vaginal wall laxity  TODAY'S TREATMENT  EVAL   Neuromuscular re-education: Pelvic floor contraction training: A/ROM 45Y quick flicks with multimodal cues and breath coordination Self-care: Squatty potty/relaxed toileting mechanics Double-voiding Vaginal moisturizers Vulvovaginal massage (coconut oil/vmagic) Use of vibrator/regular orgasm for increased blood flow    PATIENT EDUCATION:  Education details: See above self-care Person educated: Patient Education method: Explanation, Demonstration, Tactile cues, Verbal cues, and Handouts Education comprehension: verbalized understanding   HOME EXERCISE PROGRAM: [id number]  ASSESSMENT:  CLINICAL IMPRESSION: Patient is a 51 y.o. female who was seen today for physical therapy evaluation and treatment for vaginal dryness and prolapse symptoms. Exam findings notable for pelvic floor weakness 1/5, no pelvic floor endurance, poor coordination, grade 2 anterior  vaginal wall laxity, perineal scar tissue restriction, restricted urethra (Rt>LT), vulvar changes consistent with decreased estrogen, and tenderness throughout pelvic floor reported as pinching. Signs and symptoms most consistent with decreased vaginal estrogen and pelvic floor weakness. Initial treatment consisted of lifestyle modifications to help improve vulvar/vaginal moisture levels and improve toileting positions as well as gentle pelvic floor contractions to help improve circulation and A/ROM. She will benefit from skilled PT intervention in order to improve vulvar/vaginal comfort, decrease symptoms of prolapse, improve core/pelvic floor strength, and create HEP that prevents symptoms  of prolapse from progressing.    OBJECTIVE IMPAIRMENTS decreased activity tolerance, decreased coordination, decreased endurance, decreased mobility, decreased ROM, decreased strength, increased fascial restrictions, increased muscle spasms, and pain.   ACTIVITY LIMITATIONS  hygiene, toileting  PARTICIPATION LIMITATIONS: interpersonal relationship, community activity, and occupation  PERSONAL FACTORS 3+ comorbidities: Breast cancer, vaginal hysterectomy, appendectomy, bowel rupture repair, lichens simplex  are also affecting patient's functional outcome.   REHAB POTENTIAL: Good  CLINICAL DECISION MAKING: Stable/uncomplicated  EVALUATION COMPLEXITY: Low   GOALS: Goals reviewed with patient? Yes  SHORT TERM GOALS: Target date: 09/29/2021  Pt will be independent with HEP.   Baseline: Goal status: INITIAL  2.  Pt will be able to correctly perform diaphragmatic breathing and appropriate pressure management in order to prevent worsening vaginal wall laxity and improve pelvic floor A/ROM.    Baseline:  Goal status: INITIAL    LONG TERM GOALS: Target date: 11/24/2021   Pt will be independent with advanced HEP.   Baseline:  Goal status: INITIAL  2.  Pt will demonstrate normal pelvic floor muscle tone and A/ROM, able to achieve 3/5 strength with contractions and 10 sec endurance, in order to provide appropriate lumbopelvic support in functional activities.   Baseline:  Goal status: INITIAL  3.  Patient will be independent with vulvovaginal massage and report improved vaginal dryness.  Baseline:  Goal status: INITIAL  4.  Pt will report 0/10 pain with vaginal penetration in order to improve intimate relationship with partner.    Baseline:  Goal status: INITIAL  5.  Pt will report no leaks with laughing, coughing, sneezing in order to improve comfort with interpersonal relationships and community activities.   Baseline:  Goal status: INITIAL  6. Pt will be able to go  2-3 hours in between voids without urgency or incontinence in order to improve QOL and perform all functional activities with less difficulty.   Baseline:  Goal status: INITIAL   PLAN: PT FREQUENCY: 1x/week  PT DURATION: 12 weeks  PLANNED INTERVENTIONS: Therapeutic exercises, Therapeutic activity, Neuromuscular re-education, Balance training, Gait training, Patient/Family education, Joint mobilization, Dry Needling, Biofeedback, and Manual therapy  PLAN FOR NEXT SESSION: Check in with lifestyle modifications; begin core training/progressions and mobility exercises.    Heather Roberts, PT, DPT05/22/231:50 PM

## 2021-09-03 NOTE — Progress Notes (Signed)
[facility] Note  09/05/2021     CHIEF COMPLAINT Patient presents for Retina Follow Up  HISTORY OF PRESENT ILLNESS: Sandy Goodwin is a 51 y.o. female who presents to the clinic today for:   HPI     Retina Follow Up   Patient presents with  Other.  In right eye.  This started 3 months ago.  I, the attending physician,  performed the HPI with the patient and updated documentation appropriately.        Comments   Patient here for 3 months retina follow up for retinal tear OD. Patient states vision doing ok. No eye pain.       Last edited by Bernarda Caffey, MD on 09/09/2021  9:10 AM.    Pt has had an eye exam with Dr. Nicki Reaper, he gave her a glasses rx for driving  Referring physician: Macarthur Critchley, [address].  [place], [place] [address]  HISTORICAL INFORMATION:   Selected notes from the MEDICAL RECORD NUMBER Referred by Dr. Nicki Reaper LEE: 01.23.23 Ocular Hx- HST OD, PVD OD   CURRENT MEDICATIONS: No current outpatient medications on file. (Ophthalmic Drugs)   No current facility-administered medications for this visit. (Ophthalmic Drugs)   Current Outpatient Medications (Other)  Medication Sig   anastrozole (ARIMIDEX) 1 MG tablet Take 1 tablet (1 mg total) by mouth daily.   BYSTOLIC 5 MG tablet Take 1 tablet by mouth daily.   cetirizine (ZYRTEC) 10 MG tablet Take 10 mg by mouth daily.   clobetasol ointment (TEMOVATE) 0.05 % Apply topically 2 (two) times daily.   levothyroxine (SYNTHROID, LEVOTHROID) 50 MCG tablet Take 50 mcg by mouth daily before breakfast.   LORazepam (ATIVAN) 0.5 MG tablet Take 1 tablet (0.'5mg'$  tablet) at bedtime, as needed for nausea.   pantoprazole (PROTONIX) 40 MG tablet Take 40 mg by mouth daily.   triamcinolone (NASACORT) 55 MCG/ACT AERO nasal inhaler Place 2 sprays into the nose daily. Pt uses during allergy season   valACYclovir (VALTREX) 1000 MG tablet Take 1 tablet (1,000 mg total) by mouth 2 (two) times daily.    metroNIDAZOLE (METROCREAM) 0.75 % cream Apply 1 application topically 2 (two) times daily. (Patient not taking: Reported on 08/22/2021)   Turmeric 500 MG CAPS Take by mouth. (Patient not taking: Reported on 08/22/2021)   No current facility-administered medications for this visit. (Other)   REVIEW OF SYSTEMS: ROS   Positive for: Endocrine, Eyes Negative for: Constitutional, Gastrointestinal, Neurological, Skin, Genitourinary, Musculoskeletal, HENT, Cardiovascular, Respiratory, Psychiatric, Allergic/Imm, Heme/Lymph Last edited by Theodore Demark, COA on 09/05/2021  9:12 AM.     ALLERGIES Allergies  Allergen Reactions   Codeine Other (See Comments)    Makes pt agittated and stay awake    Hydrocodone Other (See Comments)    Makes pt agittated and stay awake    PAST MEDICAL HISTORY Past Medical History:  Diagnosis Date   Arthritis    Breast cancer ([place])    Complication of anesthesia    Itching   Dysrhythmia    Tachycardia   Hypothyroidism    Past Surgical History:  Procedure Laterality Date   ABDOMINAL HYSTERECTOMY     APPENDECTOMY  2008   Bowel Rupture Repair  2008   Ruptured during appendectomy   Breast Biopsy Right 1999   BREAST LUMPECTOMY WITH RADIOACTIVE SEED AND SENTINEL LYMPH NODE BIOPSY Right 12/11/2020   Procedure: RIGHT BREAST LUMPECTOMY WITH RADIOACTIVE SEED X2 AND RIGHT AXILLARY SENTINEL LYMPH NODE BIOPSY;  Surgeon: Rolm Bookbinder, MD;  Location: [place];  Service: General;  Laterality: Right;   BREAST RECONSTRUCTION Right 12/20/2020   Procedure: RIGHT ONCOPLASTIC RECONSTRUCTION;  Surgeon: Irene Limbo, MD;  Location: [place];  Service: Plastics;  Laterality: Right;   BREAST REDUCTION SURGERY Left 12/20/2020   Procedure: LEFT BREAST REDUCTION;  Surgeon: Irene Limbo, MD;  Location: [place];  Service: Plastics;  Laterality: Left;   COLONOSCOPY N/A 06/15/2016   Procedure: COLONOSCOPY;  Surgeon: Rogene Houston, MD;  [facility];  Service: Endoscopy;  Laterality: N/A;  845   IBF  [id number]   INCISION AND DRAINAGE OF WOUND Right 01/10/2021   Procedure: INCISION AND DRAINAGE OF RIGHT AXILLA;  Surgeon: Irene Limbo, MD;  Location: [place];  Service: Plastics;  Laterality: Right;   PORTACATH PLACEMENT Left 07/23/2020   Procedure: INSERTION PORT-A-CATH;  Surgeon: Rolm Bookbinder, MD;  Location: [place];  Service: General;  Laterality: Left;   THYROIDECTOMY, PARTIAL Bilateral 2012   FAMILY HISTORY Family History  Problem Relation Age of Onset   Hypertension Mother    Breast cancer Mother    Lymphoma Mother    Hypertension Brother    Breast cancer Maternal Aunt    Lymphoma Maternal Grandfather    Breast cancer Maternal Aunt    SOCIAL HISTORY Social History   Tobacco Use   Smoking status: Never   Smokeless tobacco: Never  Vaping Use   Vaping Use: Never used  Substance Use Topics   Alcohol use: No   Drug use: No       OPHTHALMIC EXAM:  Base Eye Exam     Visual Acuity (Snellen - Linear)       Right Left   Dist [place] 20/20 20/20         Tonometry (Tonopen, 9:11 AM)       Right Left   Pressure 10 10         Pupils       Dark Light Shape React APD   Right 3 2 Round Brisk None   Left 3 2 Round Brisk None         Visual Fields (Counting fingers)       Left Right    Full Full         Extraocular Movement       Right Left    Full, Ortho Full, Ortho         Neuro/Psych     Oriented x3: Yes   Mood/Affect: Normal         Dilation     Both eyes: 1.0% Mydriacyl, 2.5% Phenylephrine @ 9:11 AM           Slit Lamp and Fundus Exam     External Exam       Right Left   External Normal Normal         Slit Lamp Exam       Right Left   Lids/Lashes dermatochalasis, mild telangiectasias dermatochalasis, mld telangiectasia   Conjunctiva/Sclera White and quiet White and quiet   Cornea 2+ PEE,  well healed LASIK flap 1-2+ PEE, mild tear film debris, well healed LASIK flap   Anterior Chamber Deep and clear Deep and quiet   Iris Round and dilated Round and dilated   Lens 1-2 + cortical 1-2+ cortical   Anterior Vitreous syneresis, vitreous condensations, debris and old heme settled inferiorly - improving mild syneresis, condensations  Fundus Exam       Right Left   Disc pink and sharp, compact pink and sharp   C/D Ratio 0.2 0.2.   Macula flat, good foveal reflex, no heme or edema flat, good foveal reflex, mild RPE mottling, no heme or edema   Vessels mild attenuation, tortuosity mild attenuation   Periphery Large operculated hole at 0130 with cuff of SRF surrounding -- focal RD -- now with good laser changes surrounding, otherwise attached attached, no heme, No RT/RD           IMAGING AND PROCEDURES  Imaging and Procedures for 09/05/2021  OCT, Retina - OU - Both Eyes       Right Eye Quality was good. Central Foveal Thickness: 274. Progression has been stable. Findings include normal foveal contour, no IRF, subretinal fluid (Retinal tear with SRF, SN periphery, caught on widefield, trace vitreous opacities).   Left Eye Quality was good. Central Foveal Thickness: 283. Progression has been stable. Findings include normal foveal contour, no IRF, no SRF, vitreomacular adhesion .   Notes *Images captured and stored on drive  Diagnosis / Impression:  NFP, no IRF/SRF centrally OU OD: Retinal tear with SRF, SN periphery, caught on widefield; trace vitreous opacities  Clinical management:  See below  Abbreviations: NFP - Normal foveal profile. CME - cystoid macular edema. PED - pigment epithelial detachment. IRF - intraretinal fluid. SRF - subretinal fluid. EZ - ellipsoid zone. ERM - epiretinal membrane. ORA - outer retinal atrophy. ORT - outer retinal tubulation. SRHM - subretinal hyper-reflective material. IRHM - intraretinal hyper-reflective material      Color  Fundus Photography Optos - OU - Both Eyes       Right Eye Progression has no prior data. Disc findings include normal observations. Macula : normal observations. Vessels : normal observations. Periphery : RPE abnormality, hole (Large operculated hole at 0130 with good laser surrounding).   Left Eye Progression has no prior data. Disc findings include normal observations. Macula : normal observations. Vessels : normal observations. Periphery : normal observations.   Notes **Images stored on drive**  Impression: OD: Large operculated hole at 0130 with good laser surrounding OS: normal study            ASSESSMENT/PLAN:    ICD-10-CM   1. Retinal tear of right eye  H33.311 OCT, Retina - OU - Both Eyes    Color Fundus Photography Optos - OU - Both Eyes    2. Right retinal detachment  H33.21 OCT, Retina - OU - Both Eyes    3. Posterior vitreous detachment of right eye  H43.811 OCT, Retina - OU - Both Eyes    4. Vitreous hemorrhage of right eye ([place])  H43.11     5. Hx of LASIK  Z98.890     6. Cortical age-related cataract of both eyes  H25.013      1,2. Retinal tear with +SRF / focal RD, OD           - tear located at 0130 w/ blood clot on flap and +SRF surrounding -- focal RD          - s/p laser retinopexy 01.23.23 -- good laser changes surrounding focal RD - flap tear now operculated -- first noted on 02.24.27 visit   - no new RT/RD             - pt is cleared from a retina standpoint for release to Dr. Nicki Reaper and resumption of primary eye care  3,4.  Hemorrhagic PVD OD  - w/ symptomatic flashes and floaters -- improving   - Discussed findings and prognosis  - RT/ RD as above   - Reviewed s/s of RT/RD  - Strict return precautions for any such RT/RD signs/symptoms  - VH cleared centrally, settling and improving inferiorly  5. Hx of LASIK w/ Dr. Lucita Ferrara  - stable  6. Cortical cataracts OU - The symptoms of cataract, surgical options, and treatments and risks were  discussed with patient. - discussed diagnosis and progression - not yet visually significant - monitor for now  Ophthalmic Meds Ordered this visit:  No orders of the defined types were placed in this encounter.    Return if symptoms worsen or fail to improve.  There are no Patient Instructions on file for this visit.  Explained the diagnoses, plan, and follow up with the patient and they expressed understanding.  Patient expressed understanding of the importance of proper follow up care.   This document serves as a record of services personally performed by Gardiner Sleeper, MD, PhD. It was created on their behalf by Leonie Douglas, an ophthalmic technician. The creation of this record is the provider's dictation and/or activities during the visit.    Electronically signed by: Leonie Douglas COA, 09/09/21  9:12 AM  This document serves as a record of services personally performed by Gardiner Sleeper, MD, PhD. It was created on their behalf by San Jetty. Owens Shark, OA an ophthalmic technician. The creation of this record is the provider's dictation and/or activities during the visit.    Electronically signed by: San Jetty. [place], [place] 05.26.2023 9:12 AM  Gardiner Sleeper, M.D., Ph.D. Diseases & Surgery of the Retina and Vitreous [facility]  I have reviewed the above documentation for accuracy and completeness, and I agree with the above. Gardiner Sleeper, M.D., Ph.D. 09/09/21 9:13 AM  Abbreviations: M myopia (nearsighted); A astigmatism; H hyperopia (farsighted); P presbyopia; Mrx spectacle prescription;  CTL contact lenses; OD right eye; OS left eye; OU both eyes  XT exotropia; ET esotropia; PEK punctate epithelial keratitis; PEE punctate epithelial erosions; DES dry eye syndrome; MGD meibomian gland dysfunction; ATs artificial tears; PFAT's preservative free artificial tears; [place] nuclear sclerotic cataract; PSC posterior subcapsular cataract; ERM epi-retinal membrane; PVD  posterior vitreous detachment; RD retinal detachment; DM diabetes mellitus; DR diabetic retinopathy; NPDR non-proliferative diabetic retinopathy; PDR proliferative diabetic retinopathy; CSME clinically significant macular edema; DME diabetic macular edema; dbh dot blot hemorrhages; CWS cotton wool spot; POAG primary open angle glaucoma; C/D cup-to-disc ratio; HVF humphrey visual field; GVF goldmann visual field; OCT optical coherence tomography; IOP intraocular pressure; BRVO Branch retinal vein occlusion; CRVO central retinal vein occlusion; CRAO central retinal artery occlusion; BRAO branch retinal artery occlusion; RT retinal tear; SB scleral buckle; PPV pars plana vitrectomy; VH Vitreous hemorrhage; PRP panretinal laser photocoagulation; IVK intravitreal kenalog; VMT vitreomacular traction; MH Macular hole;  NVD neovascularization of the disc; NVE neovascularization elsewhere; AREDS age related eye disease study; ARMD age related macular degeneration; POAG primary open angle glaucoma; EBMD epithelial/anterior basement membrane dystrophy; ACIOL anterior chamber intraocular lens; IOL intraocular lens; PCIOL posterior chamber intraocular lens; Phaco/IOL phacoemulsification with intraocular lens placement; [place] photorefractive keratectomy; LASIK laser assisted in situ keratomileusis; HTN hypertension; DM diabetes mellitus; COPD chronic obstructive pulmonary disease

## 2021-09-05 ENCOUNTER — Ambulatory Visit (INDEPENDENT_AMBULATORY_CARE_PROVIDER_SITE_OTHER): Payer: BC Managed Care – PPO | Admitting: Ophthalmology

## 2021-09-05 ENCOUNTER — Encounter (INDEPENDENT_AMBULATORY_CARE_PROVIDER_SITE_OTHER): Payer: Self-pay | Admitting: Ophthalmology

## 2021-09-05 DIAGNOSIS — H43811 Vitreous degeneration, right eye: Secondary | ICD-10-CM | POA: Diagnosis not present

## 2021-09-05 DIAGNOSIS — H4311 Vitreous hemorrhage, right eye: Secondary | ICD-10-CM | POA: Diagnosis not present

## 2021-09-05 DIAGNOSIS — H3321 Serous retinal detachment, right eye: Secondary | ICD-10-CM

## 2021-09-05 DIAGNOSIS — Z9889 Other specified postprocedural states: Secondary | ICD-10-CM

## 2021-09-05 DIAGNOSIS — H25013 Cortical age-related cataract, bilateral: Secondary | ICD-10-CM

## 2021-09-05 DIAGNOSIS — H33311 Horseshoe tear of retina without detachment, right eye: Secondary | ICD-10-CM | POA: Diagnosis not present

## 2021-09-09 ENCOUNTER — Encounter (INDEPENDENT_AMBULATORY_CARE_PROVIDER_SITE_OTHER): Payer: Self-pay | Admitting: Ophthalmology

## 2021-09-25 ENCOUNTER — Ambulatory Visit: Payer: BC Managed Care – PPO | Attending: General Surgery

## 2021-09-25 DIAGNOSIS — M6281 Muscle weakness (generalized): Secondary | ICD-10-CM | POA: Insufficient documentation

## 2021-09-25 DIAGNOSIS — R279 Unspecified lack of coordination: Secondary | ICD-10-CM | POA: Diagnosis present

## 2021-09-25 DIAGNOSIS — Z483 Aftercare following surgery for neoplasm: Secondary | ICD-10-CM | POA: Insufficient documentation

## 2021-09-25 DIAGNOSIS — M62838 Other muscle spasm: Secondary | ICD-10-CM | POA: Insufficient documentation

## 2021-09-25 NOTE — Therapy (Signed)
OUTPATIENT PHYSICAL THERAPY TREATMENT NOTE   Patient Name: Sandy Goodwin MRN: [id number] DOB:02/24/1971, 51 y.o., female Today's Date: 09/25/2021  PCP: Caryl Bis, MD REFERRING PROVIDER: Gardenia Phlegm, NP  END OF SESSION:   PT End of Session - 09/25/21 0934     Visit Number 2    Number of Visits 10    Date for PT Re-Evaluation 11/24/21    Authorization Type BCBS    PT Start Time [id number]    PT Stop Time 1011    PT Time Calculation (min) 40 min    Activity Tolerance Patient tolerated treatment well    Behavior During Therapy [facility] for tasks assessed/performed             Past Medical History:  Diagnosis Date   Arthritis    Breast cancer ([place])    Complication of anesthesia    Itching   Dysrhythmia    Tachycardia   Hypothyroidism    Past Surgical History:  Procedure Laterality Date   ABDOMINAL HYSTERECTOMY     APPENDECTOMY  2008   Bowel Rupture Repair  2008   Ruptured during appendectomy   Breast Biopsy Right 1999   BREAST LUMPECTOMY WITH RADIOACTIVE SEED AND SENTINEL LYMPH NODE BIOPSY Right 12/11/2020   Procedure: RIGHT BREAST LUMPECTOMY WITH RADIOACTIVE SEED X2 AND RIGHT AXILLARY SENTINEL LYMPH NODE BIOPSY;  Surgeon: Rolm Bookbinder, MD;  Location: [place];  Service: General;  Laterality: Right;   BREAST RECONSTRUCTION Right 12/20/2020   Procedure: RIGHT ONCOPLASTIC RECONSTRUCTION;  Surgeon: Irene Limbo, MD;  Location: [place];  Service: Plastics;  Laterality: Right;   BREAST REDUCTION SURGERY Left 12/20/2020   Procedure: LEFT BREAST REDUCTION;  Surgeon: Irene Limbo, MD;  Location: [place];  Service: Plastics;  Laterality: Left;   COLONOSCOPY N/A 06/15/2016   Procedure: COLONOSCOPY;  Surgeon: Rogene Houston, MD;  [facility];  Service: Endoscopy;  Laterality: N/A;  845   IBF  [id number]   INCISION AND DRAINAGE OF WOUND Right 01/10/2021   Procedure: INCISION AND DRAINAGE OF RIGHT  AXILLA;  Surgeon: Irene Limbo, MD;  Location: [place];  Service: Plastics;  Laterality: Right;   PORTACATH PLACEMENT Left 07/23/2020   Procedure: INSERTION PORT-A-CATH;  Surgeon: Rolm Bookbinder, MD;  Location: [place];  Service: General;  Laterality: Left;   THYROIDECTOMY, PARTIAL Bilateral 2012   Patient Active Problem List   Diagnosis Date Noted   Uterine prolapse 03/20/2021   Polyarthralgia 03/20/2021   Port-A-Cath in place 02/06/2021   Genetic testing 07/17/2020   Malignant neoplasm of upper-outer quadrant of right breast in female, estrogen receptor positive ([place]) 07/12/2020   Change in bowel habits 06/12/2016   Mixed hyperlipidemia 01/07/2016   Hypothyroidism, unspecified 01/07/2016   Allergic rhinitis 04/02/2015   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 She has been using v-magic for about a week now and feels like it is helping with vaginal moisture. She has not started using squatty potty - just got this week. She feels like double-voiding is helpful.   PAIN:  Are  you having pain? No   SUBJECTIVE 09/01/21:                                                                                                                                                                                            SUBJECTIVE STATEMENT: Patient states that she has vaginal dryness. She also had uterine prolapse that she had hysterectomy for in addition to  bladder tack that failed. She is done with all cancer treatment except for hormone blockers that she is still taking. She is seeing new urogynecologist in July.  Fluid intake: Yes: a lot of water     Patient confirms identification and approves PT to assess pelvic floor and treatment Yes     PAIN:  Are you having pain? No     PRECAUTIONS: None   WEIGHT BEARING RESTRICTIONS No   FALLS:  Has patient fallen in last 6 months? 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   PLOF: Independent   PATIENT GOALS improving vaginal dryness/incontinence  PERTINENT HISTORY:  Breast cancer, abdominal hysterectomy, appendectomy, bowel rupture repair, lichens simplex Sexual abuse: No   BOWEL MOVEMENT Pain with bowel movement: No Type of bowel movement:Frequency 1x/day with Miralax and Strain Yes Takes Miralax most days  Fully empty rectum: Yes: - Leakage: Yes: occasional, unsure of what causes it, started after posterior grade 3 year Pads: No Fiber supplement: No   URINATION Pain with urination: No Fully empty bladder: No Stream:  WNL Urgency: Yes: no time to several minutes Frequency: every couple of hours - when at work much less Leakage: Urge to void, Walking to the bathroom, Coughing, Sneezing, and Laughing - also post-void dribble Pads: No - tries not too due to skin irritation   INTERCOURSE Pain with intercourse:  neuroma for a year after first delivery - no pain since until cancer treatment. Not currently attempting Ability to have vaginal penetration:  No Climax: Not attempted since cancer treatment     PREGNANCY Vaginal deliveries 2 Tearing Yes: Grade 3 and an anterior tear with second delivery C-section deliveries 0 Currently pregnant No   PROLAPSE She does have pessary,but is not using due to discomfort with dryness. She does feel vaginal bulge.       OBJECTIVE:    09/01/21  COGNITION:            Overall cognitive status: Within functional limits for tasks assessed  SENSATION:            Light touch: Appears intact            Proprioception: Appears intact   GAIT:   Comments: WNL   POSTURE:  WNL   PELVIC MMT:  Strength 1/5, no endurance         PALPATION:   General  no abdominal tenderness; restriction throughout bil lower abdomen                 External Perineal Exam vulvar/clitoral hood changes consistent with decreased estrogen                             Internal Pelvic Floor perineal scar tissue restriction/tenderness, restricted urethral mobility Rt, tenderness throughout superficial/deep pelvic floor    TONE: Pelvic floor atrophy   PROLAPSE: Grade 2 anterior vaginal wall laxity   TODAY'S TREATMENT 09/25/21 Neuromuscular re-education: Core retraining:  Transversus abdominus training with multimodal cues for improved motor control and breath coordination Core facilitation: Resisted march 10x bil Supine march 2 x 10 Leg extensions 10x bil Exercises: Stretches/mobility: Open books 10x bil Cat/cow 2 x 10 Strengthening: Clam shells 2 x 10 bil Bridge with hip adduction 2 x 10 Self-care: Strict bladder retraining   TREATMENT 09/01/21 EVAL    Neuromuscular re-education: Pelvic floor contraction training: A/ROM 67Y quick flicks with multimodal cues and breath coordination Self-care: Squatty potty/relaxed toileting mechanics Double-voiding Vaginal moisturizers Vulvovaginal massage (coconut oil/vmagic) Use of vibrator/regular orgasm for increased blood flow       PATIENT EDUCATION:  Education details: See above self-care Person educated: Patient Education method: Explanation, Demonstration, Tactile cues, Verbal cues, and Handouts Education comprehension: verbalized understanding     HOME EXERCISE PROGRAM: [id number]   ASSESSMENT:   CLINICAL IMPRESSION: Patient has done well incorporating some of  the lifetysle modifications that we discussed at her first appointment. She feels like post-void dribbling and vaginal moisture levels are both a little better. Mobility and strengthening exercises started today to help improve circulation throughout core/pelvis. She tolerated all exercises very well with no increase in symptoms. We discussed deep core activation and relationship with pelvic floor. She had some difficulty with initiating contraction, but with multimodal cues she did very well and was able to progress into more challenging core exercises. She will benefit from skilled PT intervention in order to improve vulvar/vaginal comfort, decrease symptoms of prolapse, improve core/pelvic floor strength, and create HEP that prevents symptoms of prolapse from progressing.      OBJECTIVE IMPAIRMENTS decreased activity tolerance, decreased coordination, decreased endurance, decreased mobility, decreased ROM, decreased strength, increased fascial restrictions, increased muscle spasms, and pain.    ACTIVITY LIMITATIONS  hygiene, toileting   PARTICIPATION LIMITATIONS: interpersonal relationship, community activity, and occupation   PERSONAL FACTORS 3+ comorbidities: Breast cancer, vaginal hysterectomy, appendectomy, bowel rupture repair, lichens simplex  are also affecting patient's functional outcome.    REHAB POTENTIAL: Good   CLINICAL DECISION MAKING: Stable/uncomplicated   EVALUATION COMPLEXITY: Low     GOALS: Goals reviewed with patient? Yes   SHORT TERM GOALS: Target date: 09/29/2021   Pt will be independent with HEP.    Baseline: Goal status: INITIAL   2.  Pt will be able to correctly perform diaphragmatic breathing and appropriate pressure management in order to prevent worsening vaginal wall laxity and improve pelvic floor A/ROM.      Baseline:  Goal status: INITIAL  LONG TERM GOALS: Target date: 11/24/2021    Pt will be independent with advanced HEP.    Baseline:   Goal status: INITIAL   2.  Pt will demonstrate normal pelvic floor muscle tone and A/ROM, able to achieve 3/5 strength with contractions and 10 sec endurance, in order to provide appropriate lumbopelvic support in functional activities.    Baseline:  Goal status: INITIAL   3.  Patient will be independent with vulvovaginal massage and report improved vaginal dryness.  Baseline:  Goal status: INITIAL   4.  Pt will report 0/10 pain with vaginal penetration in order to improve intimate relationship with partner.     Baseline:  Goal status: INITIAL   5.  Pt will report no leaks with laughing, coughing, sneezing in order to improve comfort with interpersonal relationships and community activities.    Baseline:  Goal status: INITIAL   6. Pt will be able to go 2-3 hours in between voids without urgency or incontinence in order to improve QOL and perform all functional activities with less difficulty.    Baseline:  Goal status: INITIAL     PLAN: PT FREQUENCY: 1x/week   PT DURATION: 12 weeks   PLANNED INTERVENTIONS: Therapeutic exercises, Therapeutic activity, Neuromuscular re-education, Balance training, Gait training, Patient/Family education, Joint mobilization, Dry Needling, Biofeedback, and Manual therapy   PLAN FOR NEXT SESSION: Update goals; progress strengthening/mobility exercises.    Heather Roberts, PT, DPT06/15/2310:12 AM

## 2021-09-25 NOTE — Patient Instructions (Signed)
Bladder/bowel retraining: ? ?Drink 4-8oz an hour ?ONLY WATER ?Stop water intake 3 hours before bed ?Try to go at least 2-3 hours between trips to the bathroom - go whether you need to or not. ? ?For two weeks. ? ?Urge suppression technique: ?A technique to help you hold urine until it?s an appropriate time to go, ?whether this is making it home or trying to reach a specific voiding time ?frame according to your schedule. ?It helps to send signals from the bladder to the brain that say you don?t ?actually have to void urine right now. ?This most likely only give you several minutes of relief at first, but repeat as ?needed; the benefit will last longer as you use this technique more and get ?into better bladder habits. ??  ?The technique: ?o Perform 5 quick flicks (Kegels) rapidly, not worrying about fully ?relaxing in between each (only in this technique). ?o Then perform several deep belly breaths while focusing on relaxing ?the pelvic floor. ?o Go do something else to help distract yourself from the urge to ?urinate. ?o Repeat as needed. ? ? ?

## 2021-10-06 ENCOUNTER — Ambulatory Visit: Payer: BC Managed Care – PPO

## 2021-10-06 VITALS — Wt 199.5 lb

## 2021-10-06 DIAGNOSIS — R279 Unspecified lack of coordination: Secondary | ICD-10-CM

## 2021-10-06 DIAGNOSIS — M62838 Other muscle spasm: Secondary | ICD-10-CM

## 2021-10-06 DIAGNOSIS — M6281 Muscle weakness (generalized): Secondary | ICD-10-CM

## 2021-10-06 DIAGNOSIS — Z483 Aftercare following surgery for neoplasm: Secondary | ICD-10-CM

## 2021-10-13 ENCOUNTER — Ambulatory Visit: Payer: BC Managed Care – PPO | Attending: Adult Health

## 2021-10-13 DIAGNOSIS — M6281 Muscle weakness (generalized): Secondary | ICD-10-CM | POA: Diagnosis present

## 2021-10-13 DIAGNOSIS — R279 Unspecified lack of coordination: Secondary | ICD-10-CM | POA: Diagnosis present

## 2021-10-13 DIAGNOSIS — M62838 Other muscle spasm: Secondary | ICD-10-CM | POA: Insufficient documentation

## 2021-10-13 NOTE — Therapy (Signed)
OUTPATIENT PHYSICAL THERAPY TREATMENT NOTE   Patient Name: Sandy Goodwin MRN: [id number] DOB:10/23/1970, 51 y.o., female Today's Date: 10/13/2021  PCP: Daniel, Terry G, MD REFERRING PROVIDER: Causey, Lindsey Cornetto, NP  END OF SESSION:   PT End of Session - 10/13/21 1402     Visit Number 4    Date for PT Re-Evaluation 11/24/21    Authorization Type BC[employer]    PT Start Time 0200    PT Stop Time 0240    PT Time Calculation (min) 40 min    Activity Tolerance Patient tolerated treatment well    Behavior During Therapy WFL for tasks assessed/performed               Past Medical History:  Diagnosis Date   Arthritis    Breast cancer (HCC)    Complication of anesthesia    Itching   Dysrhythmia    Tachycardia   Hypothyroidism    Past Surgical History:  Procedure Laterality Date   ABDOMINAL HYSTERECTOMY     APPENDECTOMY  2008   Bowel Rupture Repair  2008   Ruptured during appendectomy   Breast Biopsy Right 1999   BREAST LUMPECTOMY WITH RADIOACTIVE SEED AND SENTINEL LYMPH NODE BIOPSY Right 12/11/2020   Procedure: RIGHT BREAST LUMPECTOMY WITH RADIOACTIVE SEED X2 AND RIGHT AXILLARY SENTINEL LYMPH NODE BIOPSY;  Surgeon: Wakefield, Matthew, MD;  Location: [facility];  Service: General;  Laterality: Right;   BREAST RECONSTRUCTION Right 12/20/2020   Procedure: RIGHT ONCOPLASTIC RECONSTRUCTION;  Surgeon: Thimmappa, Brinda, MD;  Location: [facility];  Service: Plastics;  Laterality: Right;   BREAST REDUCTION SURGERY Left 12/20/2020   Procedure: LEFT BREAST REDUCTION;  Surgeon: Thimmappa, Brinda, MD;  Location: [facility];  Service: Plastics;  Laterality: Left;   COLONOSCOPY N/A 06/15/2016   Procedure: COLONOSCOPY;  Surgeon: Najeeb U Rehman, MD;  Location: [facility];  Service: Endoscopy;  Laterality: N/A;  845   IBF  2010,2012   INCISION AND DRAINAGE OF WOUND Right 01/10/2021   Procedure: INCISION AND DRAINAGE OF RIGHT AXILLA;  Surgeon:  Thimmappa, Brinda, MD;  Location: [facility];  Service: Plastics;  Laterality: Right;   PORTACATH PLACEMENT Left 07/23/2020   Procedure: INSERTION PORT-A-CATH;  Surgeon: Wakefield, Matthew, MD;  Location: [facility];  Service: General;  Laterality: Left;   THYROIDECTOMY, PARTIAL Bilateral 2012   Patient Active Problem List   Diagnosis Date Noted   Uterine prolapse 03/20/2021   Polyarthralgia 03/20/2021   Port-A-Cath in place 02/06/2021   Genetic testing 07/17/2020   Malignant neoplasm of upper-outer quadrant of right breast in female, estrogen receptor positive (HCC) 07/12/2020   Change in bowel habits 06/12/2016   Mixed hyperlipidemia 01/07/2016   Hypothyroidism, unspecified 01/07/2016   Allergic rhinitis 04/02/2015   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    REFERRING DIAG: C50.411,Z17.0 (ICD-10-CM) - Malignant neoplasm of upper-outer quadrant of right breast in female, estrogen receptor positive (HCC)  THERAPY DIAG:  Muscle weakness (generalized)  Unspecified lack of coordination  Other muscle spasm  Rationale for Evaluation and Treatment Rehabilitation  PERTINENT HISTORY: Breast cancer, abdominal hysterectomy, appendectomy, bowel rupture repair, lichens simplex  PRECAUTIONS: NA  SUBJECTIVE: Pt reports no changes in condition. She has been trying to work more on bladder retraining with voiding more often. She feels like she has not been in any situations in which she would have had stress incontinence. She is still having double voiding.   PAIN:  Are you having pain? No     SUBJECTIVE 09/01/21:                                                                                                                                                                                            SUBJECTIVE STATEMENT: Patient states that she has vaginal dryness. She also had uterine prolapse that she had hysterectomy for in addition to bladder tack that failed.  She is done with all cancer treatment except for hormone blockers that she is still taking. She is seeing new urogynecologist in July.  Fluid intake: Yes: a lot of water     Patient confirms identification and approves PT to assess pelvic floor and treatment Yes     PAIN:  Are you having pain? No     PRECAUTIONS: None   WEIGHT BEARING RESTRICTIONS No   FALLS:  Has patient fallen in last 6 months? 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   PLOF: Independent   PATIENT GOALS improving vaginal dryness/incontinence  PERTINENT HISTORY:  Breast cancer, abdominal hysterectomy, appendectomy, bowel rupture repair, lichens simplex Sexual abuse: No   BOWEL MOVEMENT Pain with bowel movement: No Type of bowel movement:Frequency 1x/day with Miralax and Strain Yes Takes Miralax most days  Fully empty rectum: Yes: - Leakage: Yes: occasional, unsure of what causes it, started after posterior grade 3 year Pads: No Fiber supplement: No   URINATION Pain with urination: No Fully empty bladder: No Stream:  WNL Urgency: Yes: no time to several minutes Frequency: every couple of hours - when at work much less Leakage: Urge to void, Walking to the bathroom, Coughing, Sneezing, and Laughing - also post-void dribble Pads: No - tries not too due to skin irritation   INTERCOURSE Pain with intercourse:  neuroma for a year after first delivery - no pain since until cancer treatment. Not currently attempting Ability to have vaginal penetration:  No Climax: Not attempted since cancer treatment     PREGNANCY Vaginal deliveries 2 Tearing Yes: Grade 3 and an anterior tear with second delivery C-section deliveries 0 Currently pregnant No   PROLAPSE She does have pessary,but is not using due to discomfort with dryness. She does feel vaginal bulge.       OBJECTIVE:    09/01/21 COGNITION:            Overall  cognitive status: Within functional limits for tasks assessed                          SENSATION:  Light touch: Appears intact            Proprioception: Appears intact   GAIT:   Comments: WNL   POSTURE:  WNL   PELVIC MMT:  Strength 1/5, no endurance         PALPATION:   General  no abdominal tenderness; restriction throughout bil lower abdomen                 External Perineal Exam vulvar/clitoral hood changes consistent with decreased estrogen                             Internal Pelvic Floor perineal scar tissue restriction/tenderness, restricted urethral mobility Rt, tenderness throughout superficial/deep pelvic floor    TONE: Pelvic floor atrophy   PROLAPSE: Grade 2 anterior vaginal wall laxity   TODAY'S TREATMENT 10/13/21 Manual: Myofascial release: Bladder/lower abdominal release Neuromuscular re-education: Core facilitation: Bear crawl holds 10x 2 seconds Bird dog 10x Exercises: Stretches/mobility:  Cat cow 10x Strengthening: Fire hydrants 2 x 10 bil Donkey kick 2 x 10 bil Deadlifts 2 x 10 15lbs Unilateral row 2 x 10 bil, green band  TREATMENT 10/06/21 Manual: Myofascial release: Urethral mobilization vaginally, bil Bladder mobilization externally Internal pelvic floor techniques: No emotional/communication barriers or cognitive limitation. Patient is motivated to learn. Patient understands and agrees with treatment goals and plan. PT explains patient will be examined in standing, sitting, and lying down to see how their muscles and joints work. When they are ready, they will be asked to remove their underwear so PT can examine their perineum. The patient is also given the option of providing their own chaperone as one is not provided in our facility. The patient also has the right and is explained the right to defer or refuse any part of the evaluation or treatment including the internal exam. With the patient's consent, PT will use one gloved  finger to gently assess the muscles of the pelvic floor, seeing how well it contracts and relaxes and if there is muscle symmetry. After, the patient will get dressed and PT and patient will discuss exam findings and plan of care. PT and patient discuss plan of care, schedule, attendance policy and HEP activities.  Neuromuscular re-education: Core retraining:  Core facilitation: cues for core/pelvic floor contraction with all activities Rows 2 x 10 green band Bil UE extensions 2 x 10 green band Pallof press green band 10x bil Pelvic floor contraction training: Quick flicks with breath coordination 2 x 5   TREATMENT 09/25/21 Neuromuscular re-education: Core retraining:  Transversus abdominus training with multimodal cues for improved motor control and breath coordination Core facilitation: Resisted march 10x bil Supine march 2 x 10 Leg extensions 10x bil Exercises: Stretches/mobility: Open books 10x bil Cat/cow 2 x 10 Strengthening: Clam shells 2 x 10 bil Bridge with hip adduction 2 x 10 Self-care: Strict bladder retraining        PATIENT EDUCATION:  Education details: Exercise progressions.  Person educated: Patient Education method: Explanation, Demonstration, Tactile cues, Verbal cues, and Handouts Education comprehension: verbalized understanding     HOME EXERCISE PROGRAM: [id number]   ASSESSMENT:   CLINICAL IMPRESSION: Pt reports little progress in overall condition, but also states that she has not had situations where stress incontinence would happen. She notices little improvement in post-void dribbling at this point. External myofascial release performed to bladder and lower abdomen without report of discomfort; good improvements in restriction. She did very well with   all core/pelvic floor progressions in functional strengthening. She did initially have balance/core engagement difficulty with quadruped activities, but with cuing and repetitions, was able to improve  well. Believe she is seeing progress and when she is in situations of increased abdominal pressure we will start to see symptom reduction. She will benefit from skilled PT intervention in order to improve vulvar/vaginal comfort, decrease symptoms of prolapse, improve core/pelvic floor strength, and create HEP that prevents symptoms of prolapse from progressing.      OBJECTIVE IMPAIRMENTS decreased activity tolerance, decreased coordination, decreased endurance, decreased mobility, decreased ROM, decreased strength, increased fascial restrictions, increased muscle spasms, and pain.    ACTIVITY LIMITATIONS  hygiene, toileting   PARTICIPATION LIMITATIONS: interpersonal relationship, community activity, and occupation   PERSONAL FACTORS 3+ comorbidities: Breast cancer, vaginal hysterectomy, appendectomy, bowel rupture repair, lichens simplex  are also affecting patient's functional outcome.    REHAB POTENTIAL: Good   CLINICAL DECISION MAKING: Stable/uncomplicated   EVALUATION COMPLEXITY: Low     GOALS: Goals reviewed with patient? Yes   SHORT TERM GOALS: Target date: 09/29/2021 - updated 10/13/21   Pt will be independent with HEP.    Baseline: Goal status: MET 10/06/21   2.  Pt will be able to correctly perform diaphragmatic breathing and appropriate pressure management in order to prevent worsening vaginal wall laxity and improve pelvic floor A/ROM.      Baseline:  Goal status: MET 10/13/21       LONG TERM GOALS: Target date: 11/24/2021 - updated 10/13/21   Pt will be independent with advanced HEP.    Baseline:  Goal status: IN PROGRESS   2.  Pt will demonstrate normal pelvic floor muscle tone and A/ROM, able to achieve 3/5 strength with contractions and 10 sec endurance, in order to provide appropriate lumbopelvic support in functional activities.    Baseline: patient has contractions where strength has improved to 2/5, but cuing required Goal status: IN PROGRESS   3.  Patient  will be independent with vulvovaginal massage and report improved vaginal dryness.  Baseline:  Goal status: MET 10/06/21   4.  Pt will report 0/10 pain with vaginal penetration in order to improve intimate relationship with partner.     Baseline: Pt had intercourse for the first time since treatment, reports some pain - did not use lubricant Goal status: IN PROGRESS   5.  Pt will report no leaks with laughing, coughing, sneezing in order to improve comfort with interpersonal relationships and community activities.    Baseline:  Goal status: IN PROGRESS   6. Pt will be able to go 2-3 hours in between voids without urgency or incontinence in order to improve QOL and perform all functional activities with less difficulty.    Baseline:  Goal status: IN PROGRESS     PLAN: PT FREQUENCY: 1x/week   PT DURATION: 12 weeks   PLANNED INTERVENTIONS: Therapeutic exercises, Therapeutic activity, Neuromuscular re-education, Balance training, Gait training, Patient/Family education, Joint mobilization, Dry Needling, Biofeedback, and Manual therapy   PLAN FOR NEXT SESSION: Progress strengthening/mobility exercises.    Heather Roberts, PT, DPT07/03/232:42 PM

## 2021-10-22 ENCOUNTER — Inpatient Hospital Stay: Payer: BC Managed Care – PPO | Attending: Hematology and Oncology | Admitting: Hematology and Oncology

## 2021-10-22 DIAGNOSIS — Z79811 Long term (current) use of aromatase inhibitors: Secondary | ICD-10-CM | POA: Diagnosis not present

## 2021-10-22 DIAGNOSIS — C50411 Malignant neoplasm of upper-outer quadrant of right female breast: Secondary | ICD-10-CM | POA: Insufficient documentation

## 2021-10-22 DIAGNOSIS — Z170 Estrogen receptor positive status [ER+]: Secondary | ICD-10-CM | POA: Diagnosis not present

## 2021-10-22 MED ORDER — METHYLPREDNISOLONE 4 MG PO TBPK
ORAL_TABLET | ORAL | 0 refills | Status: DC
Start: 2021-10-22 — End: 2022-01-09

## 2021-10-22 NOTE — Progress Notes (Signed)
MyChart virtual visit  Patient Care Team: Caryl Bis, MD as PCP - General (Family Medicine) Rolm Bookbinder, MD as Consulting Physician (General Surgery) Nicholas Lose, MD as Consulting Physician (Hematology and Oncology) Kyung Rudd, MD as Consulting Physician (Radiation Oncology)  DIAGNOSIS:  Encounter Diagnosis  Name Primary?   Malignant neoplasm of upper-outer quadrant of right breast in female, estrogen receptor positive ([place])     SUMMARY OF ONCOLOGIC HISTORY: Oncology History  Malignant neoplasm of upper-outer quadrant of right breast in female, estrogen receptor positive ([place])  03/23/2018 Genetic Testing   MyRisk Genetic Testing Results: Negative, with a variant of uncertain significance in POLE.  Genes Analyzed: APC, ATM, AXIN2, BARD1, BMPR1A, BRCA1, BRCA2, BRIP1, CDH1, CDK4, CDKN2A, CHEK2, EPCAM (large rearrangement only), HOXB13 (sequencing only), GALNT12, MLH1, MSH2, MSH3, MSH6, MUTYH, NBN, NTHL1, PALB2, PMS2, PTEN, RAD51C, RAD51D, RNF43, RPS20, SMAD4, STK11, TP52. Sequencing was performed for select regions of POLE and POLD1, and large rearrangement analysis was performed for select regions of GERM1.   07/08/2020 Initial Diagnosis   Screening mammogram showed right breast calcifications. Diagnostic mammogram showed right breast calcifications spanning 4.1cm and no abnormal right axillary lymph nodes. 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   07/24/2020 - 11/15/2020 Neo-Adjuvant Chemotherapy   Taxotere, Carbo, Herceptin, Perjeta given every three weeks x 6   12/05/2020 - 07/24/2021 Chemotherapy   Maintenance Herceptin/Perjeta every three weeks for one year       12/11/2020 Surgery   Right breast lumpectomy [name]): Scattered foci of high-grade DCIS with calcifications, no invasive cancer, pathologic complete response, margins negative, 0/3 lymph nodes negative, previously ER 80%, PR  40%, HER2 positive, Ki-67 25%   12/11/2020 Cancer Staging   Staging form: Breast, AJCC 8th Edition - Pathologic stage from 12/11/2020: No Stage Recommended (ypT0, pN0, cM0) - Signed by Gardenia Phlegm, NP on 03/19/2021 Stage prefix: Post-therapy   12/20/2020 Surgery   Reconstruction with Dr. Iran Planas   02/04/2021 - 03/25/2021 Radiation Therapy   Adjuvant radiation   06/2021 -  Anti-estrogen oral therapy   Anastrozole daily     CHIEF COMPLIANT: Rash on the face with swelling  INTERVAL HISTORY: Sandy Goodwin is a 51 year old with above-mentioned history of HER2 positive breast cancer who completed adjuvant treatment with Herceptin Perjeta in April 2023.  She has had several episodes of flushing and what appears to be erythema of the face accompanied by itching and exfoliation.  It responds very well to steroids but then it relapses once again.  She is very frustrated with this and we talked to her by MyChart virtual visit to discuss management of this flushing sensation.   ALLERGIES:  is allergic to codeine and hydrocodone.  MEDICATIONS:  Current Outpatient Medications  Medication Sig Dispense Refill   methylPREDNISolone (MEDROL DOSEPAK) 4 MG TBPK tablet Take as directed 21 tablet 0   BYSTOLIC 5 MG tablet Take 1 tablet by mouth daily.  1   cetirizine (ZYRTEC) 10 MG tablet Take 10 mg by mouth daily.     clobetasol ointment (TEMOVATE) 0.05 % Apply topically 2 (two) times daily.     levothyroxine (SYNTHROID, LEVOTHROID) 50 MCG tablet Take 50 mcg by mouth daily before breakfast.     LORazepam (ATIVAN) 0.5 MG tablet Take 1 tablet (0.75m tablet) at bedtime, as needed for nausea. 30 tablet 0   metroNIDAZOLE (METROCREAM) 0.75 % cream Apply 1 application topically 2 (two) times daily. (Patient not taking:  Reported on 08/22/2021)     pantoprazole (PROTONIX) 40 MG tablet Take 40 mg by mouth daily.     triamcinolone (NASACORT) 55 MCG/ACT AERO nasal inhaler Place 2 sprays into the nose daily. Pt  uses during allergy season     Turmeric 500 MG CAPS Take by mouth. (Patient not taking: Reported on 08/22/2021)     valACYclovir (VALTREX) 1000 MG tablet Take 1 tablet (1,000 mg total) by mouth 2 (two) times daily. 14 tablet 2   No current facility-administered medications for this visit.    PHYSICAL EXAMINATION: ECOG PERFORMANCE STATUS: 1 - Symptomatic but completely ambulatory     LABORATORY DATA:  I have reviewed the data as listed    Latest Ref Rng & Units 07/24/2021    9:09 AM 07/03/2021    9:01 AM 06/12/2021    9:01 AM  CMP  Glucose 70 - 99 mg/dL 107  106  102   BUN 6 - 20 mg/dL _0 Creatinine 0.44 - 1.00 mg/dL 0.87  0.70  0.77   Sodium 135 - 145 mmol/L 141  140  140   Potassium 3.5 - 5.1 mmol/L 4.4  4.1  4.0   Chloride 98 - 111 mmol/L 109  106  106   CO2 22 - 32 mmol/L _1 Calcium 8.9 - 10.3 mg/dL 8.5  8.9  9.0   Total Protein 6.5 - 8.1 g/dL 6.6  7.0  7.2   Total Bilirubin 0.3 - 1.2 mg/dL 0.2  0.3  0.4   Alkaline Phos 38 - 126 U/L 107  111  115   AST 15 - 41 U/L _2 ALT 0 - 44 U/L _3 Lab Results  Component Value Date   WBC 6.9 07/24/2021   HGB 10.5 (L) 07/24/2021   HCT 33.1 (L) 07/24/2021   MCV 85.3 07/24/2021   PLT 226 07/24/2021   NEUTROABS 4.7 07/24/2021    ASSESSMENT & PLAN:  Malignant neoplasm of upper-outer quadrant of right breast in female, estrogen receptor positive ([place]) 07/08/2020: Screening mammogram showed right breast calcifications. Diagnostic mammogram showed right breast calcifications spanning 4.1cm and no abnormal right axillary lymph nodes. 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 Genetics done in 2019: Negative   Treatment plan: 1. Neoadjuvant chemotherapy with [facility] Perjeta 6 cycles completed 11/15/2020 followed by Herceptin Perjeta maintenance for 1 year 2. 12/11/2020:Right breast lumpectomy [name]): Scattered foci of  high-grade DCIS with calcifications, no invasive cancer, pathologic complete response, margins negative, 0/3 lymph nodes negative, previously ER 80%, PR 40%, HER2 positive, Ki-67 25% 3. Followed by adjuvant radiation therapy  started 02/04/2021 4.  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 [facility] nausea study --------------------------------------------------------------------------------------------------------------------------------------- Profound intermittent rash with skin exfoliation on the face and puffiness of the face: This response to steroids but seems to be relapsing. This apparently started around March or April.  She has had several flareups of this.  Recommendation: I suspect that this is a reaction to anastrozole therapy.  I encouraged her to stop the treatment for a month. We will treat her with Medrol Dosepak once again. I will call her in 1 month to assess her symptoms and determine what to do next.    No orders of the  defined types were placed in this encounter.  The patient has a good understanding of the overall plan. she agrees with it. she will call with any problems that may develop before the next visit here. Total time spent: 30 mins including face to face time and time spent for planning, charting and co-ordination of care   Harriette Ohara, MD 10/22/21

## 2021-10-22 NOTE — Assessment & Plan Note (Signed)
07/08/2020:Screening mammogram showed right breast calcifications. Diagnostic mammogram showed right breast calcifications spanning 4.1cm and no abnormal right axillary lymph nodes. 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 Genetics done in 2019: Negative  Treatment plan: 1. Neoadjuvant chemotherapy with [place] Perjeta 6 cyclescompleted 8/5/2022followed by Herceptin Perjeta maintenance for 1 year 2.12/11/2020:Right breast lumpectomy Sandy Goodwin): Scattered foci of high-grade DCIS with calcifications, no invasive cancer, pathologic complete response, margins negative, 0/3 lymph nodes negative, previously ER 80%, PR 40%, HER2 positive, Ki-67 25% 3. Followed by adjuvant radiation therapystarted 02/04/2021 4.Followed by adjuvant antiestrogen therapy [place] [facility]nausea study --------------------------------------------------------------------------------------------------------------------------------------- Profound intermittent rash with skin exfoliation on the face and puffiness of the face: This response to steroids but seems to be relapsing. This apparently started around March or April.  She has had several flareups of this.  Recommendation: I suspect that this is a reaction to anastrozole therapy.  I encouraged her to stop the treatment for a month. We will treat her with Medrol Dosepak once again. I will call her in 1 month to assess her symptoms and determine what to do next.

## 2021-10-24 ENCOUNTER — Telehealth: Payer: Self-pay | Admitting: Hematology and Oncology

## 2021-10-24 NOTE — Telephone Encounter (Signed)
Scheduled appointment per 7/12 los. Patient is aware.

## 2021-11-06 ENCOUNTER — Ambulatory Visit: Payer: BC Managed Care – PPO

## 2021-11-06 DIAGNOSIS — M62838 Other muscle spasm: Secondary | ICD-10-CM

## 2021-11-06 DIAGNOSIS — R279 Unspecified lack of coordination: Secondary | ICD-10-CM

## 2021-11-06 DIAGNOSIS — M6281 Muscle weakness (generalized): Secondary | ICD-10-CM

## 2021-11-06 NOTE — Therapy (Signed)
OUTPATIENT PHYSICAL THERAPY TREATMENT NOTE   Patient Name: Sandy Goodwin MRN: [id number] DOB:[birth year], 51 y.o., female Today's Date: 11/06/2021  PCP: Caryl Bis, MD REFERRING PROVIDER: Gardenia Phlegm, NP  END OF SESSION:   PT End of Session - 11/06/21 1104     Visit Number 5    Date for PT Re-Evaluation 11/24/21    Authorization Type BCBS    PT Start Time 1100    PT Stop Time 1140    PT Time Calculation (min) 40 min    Activity Tolerance Patient tolerated treatment well    Behavior During Therapy [facility] for tasks assessed/performed                Past Medical History:  Diagnosis Date   Arthritis    Breast cancer ([place])    Complication of anesthesia    Itching   Dysrhythmia    Tachycardia   Hypothyroidism    Past Surgical History:  Procedure Laterality Date   ABDOMINAL HYSTERECTOMY     APPENDECTOMY  2008   Bowel Rupture Repair  2008   Ruptured during appendectomy   Breast Biopsy Right 1999   BREAST LUMPECTOMY WITH RADIOACTIVE SEED AND SENTINEL LYMPH NODE BIOPSY Right 12/11/2020   Procedure: RIGHT BREAST LUMPECTOMY WITH RADIOACTIVE SEED X2 AND RIGHT AXILLARY SENTINEL LYMPH NODE BIOPSY;  Surgeon: Rolm Bookbinder, MD;  Location: [place];  Service: General;  Laterality: Right;   BREAST RECONSTRUCTION Right 12/20/2020   Procedure: RIGHT ONCOPLASTIC RECONSTRUCTION;  Surgeon: Irene Limbo, MD;  Location: [place];  Service: Plastics;  Laterality: Right;   BREAST REDUCTION SURGERY Left 12/20/2020   Procedure: LEFT BREAST REDUCTION;  Surgeon: Irene Limbo, MD;  Location: [place];  Service: Plastics;  Laterality: Left;   COLONOSCOPY N/A 06/15/2016   Procedure: COLONOSCOPY;  Surgeon: Rogene Houston, MD;  [facility];  Service: Endoscopy;  Laterality: N/A;  845   IBF  [id number]   INCISION AND DRAINAGE OF WOUND Right 01/10/2021   Procedure: INCISION AND DRAINAGE OF RIGHT AXILLA;  Surgeon:  Irene Limbo, MD;  Location: [place];  Service: Plastics;  Laterality: Right;   PORTACATH PLACEMENT Left 07/23/2020   Procedure: INSERTION PORT-A-CATH;  Surgeon: Rolm Bookbinder, MD;  Location: [place];  Service: General;  Laterality: Left;   THYROIDECTOMY, PARTIAL Bilateral 2012   Patient Active Problem List   Diagnosis Date Noted   Uterine prolapse 03/20/2021   Polyarthralgia 03/20/2021   Port-A-Cath in place 02/06/2021   Genetic testing 07/17/2020   Malignant neoplasm of upper-outer quadrant of right breast in female, estrogen receptor positive ([place]) 07/12/2020   Change in bowel habits 06/12/2016   Mixed hyperlipidemia 01/07/2016   Hypothyroidism, unspecified 01/07/2016   Allergic rhinitis 04/02/2015   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 She has been working on exercises regularly and making sure she is drinking water/going to the bathroom more regularly. She sees urologist tomorrow morning.   PAIN:  Are you having pain?  No   SUBJECTIVE 09/01/21:                                                                                                                                                                                            SUBJECTIVE STATEMENT: Patient states that she has vaginal dryness. She also had uterine prolapse that she had hysterectomy for in  addition to bladder tack that failed. She is done with all cancer treatment except for hormone blockers that she is still taking. She is seeing new urogynecologist in July.  Fluid intake: Yes: a lot of water     Patient confirms identification and approves PT to assess pelvic floor and treatment Yes     PAIN:  Are you having pain? No     PRECAUTIONS: None   WEIGHT BEARING RESTRICTIONS No   FALLS:  Has patient fallen in last 6 months? 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   PLOF: Independent   PATIENT GOALS improving vaginal dryness/incontinence  PERTINENT HISTORY:  Breast cancer, abdominal hysterectomy, appendectomy, bowel rupture repair, lichens simplex Sexual abuse: No   BOWEL MOVEMENT Pain with bowel movement: No Type of bowel movement:Frequency 1x/day with Miralax and Strain Yes Takes Miralax most days  Fully empty rectum: Yes: - Leakage: Yes: occasional, unsure of what causes it, started after posterior grade 3 year Pads: No Fiber supplement: No   URINATION Pain with urination: No Fully empty bladder: No Stream:  WNL Urgency: Yes: no time to several minutes Frequency: every couple of hours - when at work much less Leakage: Urge to void, Walking to the bathroom, Coughing, Sneezing, and Laughing - also post-void dribble Pads: No - tries not too due to skin irritation   INTERCOURSE Pain with intercourse:  neuroma for a year after first delivery - no pain since until cancer treatment. Not currently attempting Ability to have vaginal penetration:  No Climax: Not attempted since cancer treatment     PREGNANCY Vaginal deliveries 2 Tearing Yes: Grade 3 and an anterior tear with second delivery C-section deliveries 0 Currently pregnant No   PROLAPSE She does have pessary,but is not using due to discomfort with dryness. She does feel vaginal bulge.       OBJECTIVE:     09/01/21 COGNITION:            Overall cognitive status: Within functional limits for tasks assessed                          SENSATION:  Light touch: Appears intact            Proprioception: Appears intact   GAIT:   Comments: WNL   POSTURE:  WNL   PELVIC MMT:  Strength 1/5, no endurance         PALPATION:   General  no abdominal tenderness; restriction throughout bil lower abdomen                 External Perineal Exam vulvar/clitoral hood changes consistent with decreased estrogen                             Internal Pelvic Floor perineal scar tissue restriction/tenderness, restricted urethral mobility Rt, tenderness throughout superficial/deep pelvic floor    TONE: Pelvic floor atrophy   PROLAPSE: Grade 2 anterior vaginal wall laxity   TODAY'S TREATMENT 11/06/21 Manual: Myofascial release: Bladder/lower abdominal release Exercises: Stretches/mobility: Strengthening: Seated hip IR with adduction, red band/ball, 3 x 10 Seated clam shells 3 x 10 blue band Sidestepping red band 2 laps Squats with green band 2 x 10   TREATMENT 10/13/21 Manual: Myofascial release: Bladder/lower abdominal release Neuromuscular re-education: Core facilitation: Bear crawl holds 10x 2 seconds Bird dog 10x Exercises: Stretches/mobility:  Cat cow 10x Strengthening: Fire hydrants 2 x 10 bil Donkey kick 2 x 10 bil Deadlifts 2 x 10 15lbs Unilateral row 2 x 10 bil, green band  TREATMENT 10/06/21 Manual: Myofascial release: Urethral mobilization vaginally, bil Bladder mobilization externally Internal pelvic floor techniques: No emotional/communication barriers or cognitive limitation. Patient is motivated to learn. Patient understands and agrees with treatment goals and plan. PT explains patient will be examined in standing, sitting, and lying down to see how their muscles and joints work. When they are ready, they will be asked to remove their underwear so PT can examine  their perineum. The patient is also given the option of providing their own chaperone as one is not provided in our facility. The patient also has the right and is explained the right to defer or refuse any part of the evaluation or treatment including the internal exam. With the patient's consent, PT will use one gloved finger to gently assess the muscles of the pelvic floor, seeing how well it contracts and relaxes and if there is muscle symmetry. After, the patient will get dressed and PT and patient will discuss exam findings and plan of care. PT and patient discuss plan of care, schedule, attendance policy and HEP activities.  Neuromuscular re-education: Core retraining:  Core facilitation: cues for core/pelvic floor contraction with all activities Rows 2 x 10 green band Bil UE extensions 2 x 10 green band Pallof press green band 10x bil Pelvic floor contraction training: Quick flicks with breath coordination 2 x 5         PATIENT EDUCATION:  Education details: Exercise progressions.  Person educated: Patient Education method: Explanation, Demonstration, Tactile cues, Verbal cues, and Handouts Education comprehension: verbalized understanding     HOME EXERCISE PROGRAM: [id number]   ASSESSMENT:   CLINICAL IMPRESSION: Pt beginning to see more progress with urinary urgency and leaking. Believe that working on going more frequently with voiding schedule and hydrating well and making a notable difference in condition. Bladder mobilization and myofascial release to lower abdomen performed today with good tolerance and improved mobility. She did well with all exercise progressions demonstrating good pelvic floor/core facilitation. Squats initially painful in knees, but once form corrected and more posterior weight shift performed  she reported they were comfortable - better balance when band used for core facilitation. She will benefit from skilled PT intervention in order to improve  vulvar/vaginal comfort, decrease symptoms of prolapse, improve core/pelvic floor strength, and create HEP that prevents symptoms of prolapse from progressing.      OBJECTIVE IMPAIRMENTS decreased activity tolerance, decreased coordination, decreased endurance, decreased mobility, decreased ROM, decreased strength, increased fascial restrictions, increased muscle spasms, and pain.    ACTIVITY LIMITATIONS  hygiene, toileting   PARTICIPATION LIMITATIONS: interpersonal relationship, community activity, and occupation   PERSONAL FACTORS 3+ comorbidities: Breast cancer, vaginal hysterectomy, appendectomy, bowel rupture repair, lichens simplex  are also affecting patient's functional outcome.    REHAB POTENTIAL: Good   CLINICAL DECISION MAKING: Stable/uncomplicated   EVALUATION COMPLEXITY: Low     GOALS: Goals reviewed with patient? Yes   SHORT TERM GOALS: Target date: 09/29/2021 - updated 10/13/21   Pt will be independent with HEP.    Baseline: Goal status: MET 10/06/21   2.  Pt will be able to correctly perform diaphragmatic breathing and appropriate pressure management in order to prevent worsening vaginal wall laxity and improve pelvic floor A/ROM.      Baseline:  Goal status: MET 10/13/21       LONG TERM GOALS: Target date: 11/24/2021 - updated 10/13/21   Pt will be independent with advanced HEP.    Baseline:  Goal status: IN PROGRESS   2.  Pt will demonstrate normal pelvic floor muscle tone and A/ROM, able to achieve 3/5 strength with contractions and 10 sec endurance, in order to provide appropriate lumbopelvic support in functional activities.    Baseline: patient has contractions where strength has improved to 2/5, but cuing required Goal status: IN PROGRESS   3.  Patient will be independent with vulvovaginal massage and report improved vaginal dryness.  Baseline:  Goal status: MET 10/06/21   4.  Pt will report 0/10 pain with vaginal penetration in order to improve  intimate relationship with partner.     Baseline: Pt had intercourse for the first time since treatment, reports some pain - did not use lubricant Goal status: IN PROGRESS   5.  Pt will report no leaks with laughing, coughing, sneezing in order to improve comfort with interpersonal relationships and community activities.    Baseline:  Goal status: IN PROGRESS   6. Pt will be able to go 2-3 hours in between voids without urgency or incontinence in order to improve QOL and perform all functional activities with less difficulty.    Baseline:  Goal status: IN PROGRESS     PLAN: PT FREQUENCY: 1x/week   PT DURATION: 12 weeks   PLANNED INTERVENTIONS: Therapeutic exercises, Therapeutic activity, Neuromuscular re-education, Balance training, Gait training, Patient/Family education, Joint mobilization, Dry Needling, Biofeedback, and Manual therapy   PLAN FOR NEXT SESSION: Progress strengthening/mobility exercises.    Heather Roberts, PT, DPT07/27/2311:42 AM

## 2021-11-07 ENCOUNTER — Encounter: Payer: Self-pay | Admitting: Obstetrics and Gynecology

## 2021-11-07 ENCOUNTER — Ambulatory Visit (INDEPENDENT_AMBULATORY_CARE_PROVIDER_SITE_OTHER): Payer: BC Managed Care – PPO | Admitting: Obstetrics and Gynecology

## 2021-11-07 VITALS — BP 122/84 | HR 74 | Ht 65.25 in | Wt 196.0 lb

## 2021-11-07 DIAGNOSIS — N952 Postmenopausal atrophic vaginitis: Secondary | ICD-10-CM | POA: Diagnosis not present

## 2021-11-07 DIAGNOSIS — N393 Stress incontinence (female) (male): Secondary | ICD-10-CM

## 2021-11-07 DIAGNOSIS — N816 Rectocele: Secondary | ICD-10-CM | POA: Diagnosis not present

## 2021-11-07 DIAGNOSIS — R159 Full incontinence of feces: Secondary | ICD-10-CM | POA: Diagnosis not present

## 2021-11-07 DIAGNOSIS — N8110 Cystocele, unspecified: Secondary | ICD-10-CM

## 2021-11-07 DIAGNOSIS — N993 Prolapse of vaginal vault after hysterectomy: Secondary | ICD-10-CM

## 2021-11-07 DIAGNOSIS — R350 Frequency of micturition: Secondary | ICD-10-CM | POA: Diagnosis not present

## 2021-11-07 LAB — POCT URINALYSIS DIPSTICK
Appearance: NEGATIVE
Bilirubin, UA: NEGATIVE
Blood, UA: NEGATIVE
Glucose, UA: NEGATIVE
Ketones, UA: NEGATIVE
Leukocytes, UA: NEGATIVE
Nitrite, UA: NEGATIVE
Protein, UA: NEGATIVE
Spec Grav, UA: 1.02 (ref 1.010–1.025)
Urobilinogen, UA: 0.2 E.U./dL
pH, UA: 6.5 (ref 5.0–8.0)

## 2021-11-07 NOTE — Progress Notes (Signed)
[facility] New Patient Evaluation and Consultation  Referring Provider: Caryl Bis, MD PCP: Caryl Bis, MD Date of Service: 11/07/2021  SUBJECTIVE Chief Complaint: New Patient (Initial Visit) (Sandy Goodwin is a 51 y.o. female here for a consul ton prolapse and incontinence.)  History of Present Illness: Sandy Goodwin is a 51 y.o. White or Caucasian female presenting for evaluation of prolapse.     Urinary Symptoms: Leaks urine with cough/ sneeze, exercise, lifting, with a full bladder, and with urgency Leaks is variable, SUI >> UUI Pad use: none She is bothered by her UI symptoms. Has tried pelvic floor PT- has seen some improvement.   Day time voids 8-10.  Nocturia: 1-2 times per night to void. Voiding dysfunction: she does not empty her bladder well.  does not use a catheter to empty bladder.  When urinating, she feels dribbling after finishing, the need to urinate multiple times in a row, and to push on her belly or vagina to empty bladder  UTIs: 1-2 UTI's in the last year.   Reports history of kidney or bladder stones  Pelvic Organ Prolapse Symptoms:                  She Admits to a feeling of a bulge the vaginal area. It has been present for 4-5 years.  She Admits to seeing a bulge.  This bulge is bothersome. Per notes in Epic 03/21/19: preop dx uterine prolapse with enterocele. Performed TVH with MCCall culdoplasty, cystoscopy Has tried several pessaries- last was a #4 ring with support at [facility]  Bowel Symptom: Bowel movements: 1 time(s) per day Stool consistency: soft  Straining: yes.  Splinting: yes.  Incomplete evacuation: yes.  She Admits to accidental bowel leakage / fecal incontinence  Occurs: occasionally  Consistency with leakage: soft  Bowel regimen: diet and miralax Last colonoscopy: Date 2018, Results- normal  Sexual Function Sexually active: yes.  Sexual orientation:  heterosexual Pain with sex: Yes, at the vaginal opening, has  discomfort due to dryness Has been using v-magic on the vulva and has been using replens and good love moisturizer internally when needed.  Breast cancer is estrogen receptor positive.  She is allergic to hyaluronic acid.  Tried vitamin E suppositories and were very messy and did not help.   Pelvic Pain Denies pelvic pain  Was on anastrazole therapy and then stopped recently due to skin issues. Planning to go back on soon.   Past Medical History:  Past Medical History:  Diagnosis Date   Arthritis    Breast cancer ([place])    Complication of anesthesia    Itching   Dysrhythmia    Tachycardia   Hypothyroidism      Past Surgical History:   Past Surgical History:  Procedure Laterality Date   ABDOMINAL HYSTERECTOMY     APPENDECTOMY  2008   Bowel Rupture Repair  2008   Ruptured during appendectomy   Breast Biopsy Right 1999   BREAST LUMPECTOMY WITH RADIOACTIVE SEED AND SENTINEL LYMPH NODE BIOPSY Right 12/11/2020   Procedure: RIGHT BREAST LUMPECTOMY WITH RADIOACTIVE SEED X2 AND RIGHT AXILLARY SENTINEL LYMPH NODE BIOPSY;  Surgeon: Rolm Bookbinder, MD;  Location: [place];  Service: General;  Laterality: Right;   BREAST RECONSTRUCTION Right 12/20/2020   Procedure: RIGHT ONCOPLASTIC RECONSTRUCTION;  Surgeon: Irene Limbo, MD;  Location: [place];  Service: Plastics;  Laterality: Right;   BREAST REDUCTION SURGERY Left 12/20/2020   Procedure: LEFT BREAST REDUCTION;  Surgeon: Irene Limbo, MD;  Location: [place];  Service: Plastics;  Laterality: Left;   COLONOSCOPY N/A 06/15/2016   Procedure: COLONOSCOPY;  Surgeon: Rogene Houston, MD;  [facility];  Service: Endoscopy;  Laterality: N/A;  845   IBF  [id number]   INCISION AND DRAINAGE OF WOUND Right 01/10/2021   Procedure: INCISION AND DRAINAGE OF RIGHT AXILLA;  Surgeon: Irene Limbo, MD;  Location: [place];  Service: Plastics;  Laterality: Right;    PORTACATH PLACEMENT Left 07/23/2020   Procedure: INSERTION PORT-A-CATH;  Surgeon: Rolm Bookbinder, MD;  Location: [place];  Service: General;  Laterality: Left;   THYROIDECTOMY, PARTIAL Bilateral 2012     Past OB/GYN History: OB History  Gravida Para Term Preterm AB Living  '4       2 2  '$ SAB IAB Ectopic Multiple Live Births  2       2    # Outcome Date GA Lbr Len/2nd Weight Sex Delivery Anes PTL Lv  4 Gravida           3 Gravida           2 SAB           1 SAB            Has a history of 3rd degree laceration with last delivery.  Vaginal deliveries: 2 S/p hysterectomy   Medications: She has a current medication list which includes the following prescription(s): bystolic, cetirizine, clobetasol ointment, levothyroxine, lorazepam, methylprednisolone, metronidazole, pantoprazole, triamcinolone, turmeric, and valacyclovir.   Allergies: Patient is allergic to codeine, hyaluronic acid [sodium hyaluronate], and hydrocodone.   Social History:  Social History   Tobacco Use   Smoking status: Never   Smokeless tobacco: Never  Vaping Use   Vaping Use: Never used  Substance Use Topics   Alcohol use: No   Drug use: No    Relationship status: married She lives with husband and children.   She is employed as a Librarian, academic. Regular exercise: Yes: walking, pelvic floor PT exercises History of abuse: No  Family History:   Family History  Problem Relation Age of Onset   Hypertension Mother    Breast cancer Mother    Lymphoma Mother    Hypertension Brother    Breast cancer Maternal Aunt    Lymphoma Maternal Grandfather    Breast cancer Maternal Aunt      Review of Systems: Review of Systems  Constitutional:  Positive for weight loss. Negative for fever and malaise/fatigue.  Respiratory:  Negative for cough, shortness of breath and wheezing.   Cardiovascular:  Negative for chest pain, palpitations and leg swelling.  Gastrointestinal:  Negative for  abdominal pain and blood in stool.  Genitourinary:  Negative for dysuria.  Musculoskeletal:  Negative for myalgias.  Skin:  Negative for rash.  Neurological:  Positive for headaches. Negative for dizziness.  Endo/Heme/Allergies:  Bruises/bleeds easily.       + hot flashes  Psychiatric/Behavioral:  Negative for depression. The patient is not nervous/anxious.      OBJECTIVE Physical Exam: Vitals:   11/07/21 0839  BP: 122/84  Pulse: 74  Weight: 196 lb (88.9 kg)  Height: 5' 5.25" (1.657 m)    Physical Exam Constitutional:      General: She is not in acute distress. Pulmonary:     Effort: Pulmonary effort is normal.  Abdominal:     General: There is no distension.     Palpations: Abdomen is  soft.     Tenderness: There is no abdominal tenderness. There is no rebound.  Musculoskeletal:        General: No swelling. Normal range of motion.  Skin:    General: Skin is warm and dry.     Findings: No rash.  Neurological:     Mental Status: She is alert and oriented to person, place, and time.  Psychiatric:        Mood and Affect: Mood normal.        Behavior: Behavior normal.      GU / Detailed Urogynecologic Evaluation:  Pelvic Exam: Normal external female genitalia; Bartholin's and Skene's glands normal in appearance; urethral meatus normal in appearance, no urethral masses or discharge.   CST: negative  s/p hysterectomy: Speculum exam reveals normal vaginal mucosa with  atrophy and normal vaginal cuff.  Adnexa no mass, fullness, tenderness.     Pelvic floor strength II/V, puborectalis III/V external anal sphincter III/V  Pelvic floor musculature: Right levator non-tender, Right obturator non-tender, Left levator tender, Left obturator tender  POP-Q:   POP-Q  -2                                            Aa   -2                                           Ba  -6                                              C   4.5                                            Gh   4                                            Pb  8.5                                            tvl   0                                            Ap  0                                            Bp  D     Rectal Exam:  Normal sphincter tone, thin perineal body, moderate distal rectocele, enterocoele not present, no rectal masses, no sign of dyssynergia when asking the patient to bear down.  Post-Void Residual (PVR) by Bladder Scan: In order to evaluate bladder emptying, we discussed obtaining a postvoid residual and she agreed to this procedure.  Procedure: The ultrasound unit was placed on the patient's abdomen in the suprapubic region after the patient had voided. A PVR of 5 ml was obtained by bladder scan.  Laboratory Results: POC urine: negative   ASSESSMENT AND PLAN Ms. Gras is a 51 y.o. with:  1. Prolapse of posterior vaginal wall   2. Vaginal vault prolapse after hysterectomy   3. Incontinence of feces, unspecified fecal incontinence type   4. Urinary frequency   5. SUI (stress urinary incontinence, female)   6. Vaginal atrophy    Stage I anterior, Stage II posterior, Stage I apical prolapse - For treatment of pelvic organ prolapse, we discussed options for management including expectant management, conservative management, and surgical management, such as Kegels, a pessary, pelvic floor physical therapy, and specific surgical procedures. - She is interested in surgery. We discussed two options for prolapse repair:  1) vaginal repair without mesh - Pros - safer, no mesh complications - Cons - not as strong as mesh repair, higher risk of recurrence  2) laparoscopic repair with mesh - Pros - stronger, better long-term success - Cons - risks of mesh implant (erosion into vagina or bladder, adhering to the rectum, pain) - these risks are lower than with a vaginal mesh but still exist - Will plan for Posterior repair,  SSLF  2. Fecal incontinence - little improvement with PT - Has thin perineal body and history of 3rd degree laceration. Will plan for concurrent anal sphincteroplasty.   3. Urinary urgency - not as bothersome as SUI symptoms  4. SUI - For treatment of stress urinary incontinence,  non-surgical options include expectant management, weight loss, physical therapy, as well as a pessary.  Surgical options include a midurethral sling, Burch urethropexy, and transurethral injection of a bulking agent. - Will have her return for simple CMG testing to finalize surgical plan.   5. Vaginal atrophy - For symptomatic vaginal atrophy options include lubrication with a water-based lubricant, personal hygiene measures and barrier protection against wetness, and estrogen replacement in the form of vaginal cream, vaginal tablets, or a time-released vaginal ring.   - Discussed that vaginal estrogen has very low systemic absorption rate of ~ 0.01% with a twice-week regimen and studies have shown its does not affect breast cancer recurrence. However, she should discuss this with her oncologist if this is an option for her.  - Recommended coconut oil as needed  Return for simple CMG  [name], MD

## 2021-11-10 ENCOUNTER — Ambulatory Visit: Payer: BC Managed Care – PPO

## 2021-11-10 NOTE — Progress Notes (Signed)
HEMATOLOGY-ONCOLOGY TELEPHONE VISIT PROGRESS NOTE  I connected with our patient on 11/19/21 at  8:15 AM EDT by telephone and verified that I am speaking with the correct person using two identifiers.  I discussed the limitations, risks, security and privacy concerns of performing an evaluation and management service by telephone and the availability of in person appointments.  I also discussed with the patient that there may be a patient responsible charge related to this service. The patient expressed understanding and agreed to proceed.   History of Present Illness: The facial rash has improved significantly with steroids and stoppage of anastrozole.  Oncology History  Malignant neoplasm of upper-outer quadrant of right breast in female, estrogen receptor positive ([place])  03/23/2018 Genetic Testing   [facility] Genetic Testing Results: Negative, with a variant of uncertain significance in POLE.  Genes Analyzed: APC, ATM, AXIN2, BARD1, BMPR1A, BRCA1, BRCA2, BRIP1, CDH1, CDK4, CDKN2A, CHEK2, EPCAM (large rearrangement only), HOXB13 (sequencing only), GALNT12, MLH1, MSH2, MSH3, MSH6, MUTYH, NBN, NTHL1, PALB2, PMS2, PTEN, RAD51C, RAD51D, RNF43, RPS20, SMAD4, STK11, TP52. Sequencing was performed for select regions of POLE and POLD1, and large rearrangement analysis was performed for select regions of GERM1.   07/08/2020 Initial Diagnosis   Screening mammogram showed right breast calcifications. Diagnostic mammogram showed right breast calcifications spanning 4.1cm and no abnormal right axillary lymph nodes. 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   07/24/2020 - 11/15/2020 Neo-Adjuvant Chemotherapy   Taxotere, Carbo, Herceptin, Perjeta given every three weeks x 6   12/05/2020 - 07/24/2021 Chemotherapy   Maintenance Herceptin/Perjeta every three weeks for one year       12/11/2020 Surgery   Right breast lumpectomy  [name]): Scattered foci of high-grade DCIS with calcifications, no invasive cancer, pathologic complete response, margins negative, 0/3 lymph nodes negative, previously ER 80%, PR 40%, HER2 positive, Ki-67 25%   12/11/2020 Cancer Staging   Staging form: Breast, AJCC 8th Edition - Pathologic stage from 12/11/2020: No Stage Recommended (ypT0, pN0, cM0) - Signed by Gardenia Phlegm, NP on 03/19/2021 Stage prefix: Post-therapy   12/20/2020 Surgery   Reconstruction with Dr. Iran Planas   02/04/2021 - 03/25/2021 Radiation Therapy   Adjuvant radiation   06/2021 -  Anti-estrogen oral therapy   Anastrozole daily     REVIEW OF SYSTEMS:   Constitutional: Denies fevers, chills or abnormal weight loss All other systems were reviewed with the patient and are negative.  Observations/Objective:    Assessment Plan:  Malignant neoplasm of upper-outer quadrant of right breast in female, estrogen receptor positive ([place]) 07/08/2020: Screening mammogram showed right breast calcifications. Diagnostic mammogram showed right breast calcifications spanning 4.1cm and no abnormal right axillary lymph nodes. 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 Genetics done in 2019: Negative   Treatment plan: 1. Neoadjuvant chemotherapy with TCH Perjeta 6 cycles completed 11/15/2020 followed by Herceptin Perjeta maintenance for 1 year 2. 12/11/2020:Right breast lumpectomy [name]): Scattered foci of high-grade DCIS with calcifications, no invasive cancer, pathologic complete response, margins negative, 0/3 lymph nodes negative, previously ER 80%, PR 40%, HER2 positive, Ki-67 25% 3. Followed by adjuvant radiation therapy  started 02/04/2021 4.  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 URCC nausea  study --------------------------------------------------------------------------------------------------------------------------------------- Profound intermittent rash with skin exfoliation on the face and puffiness of the face started March  2023  Recommendation: Stopped anastrozole 10/22/2020 and treated with Medrol Dosepak  Finally the rash is improving.  She has an appointment to see her rheumatologist.  Plan: Send a prescription for exemestane.  She might start it 11/25/2021. If she has any problems she will call us. Otherwise we have an appointment to see her in September.  Prior to that she gets an MRI and a DEXA scan.  I discussed the assessment and treatment plan with the patient. The patient was provided an opportunity to ask questions and all were answered. The patient agreed with the plan and demonstrated an understanding of the instructions. The patient was advised to call back or seek an in-person evaluation if the symptoms worsen or if the condition fails to improve as anticipated.   I provided 12 minutes of non-face-to-face time during this encounter.  This includes time for charting and coordination of care   Harriette Ohara, MD   I Gardiner Coins am scribing for Dr. Lindi Adie  I have reviewed the above documentation for accuracy and completeness, and I agree with the above.

## 2021-11-18 NOTE — Assessment & Plan Note (Signed)
07/08/2020:Screening mammogram showed right breast calcifications. Diagnostic mammogram showed right breast calcifications spanning 4.1cm and no abnormal right axillary lymph nodes. 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 Genetics done in 2019: Negative  Treatment plan: 1. Neoadjuvant chemotherapy with Gardiner Perjeta 6 cyclescompleted 8/5/2022followed by Herceptin Perjeta maintenance for 1 year 2.12/11/2020:Right breast lumpectomy [name]): Scattered foci of high-grade DCIS with calcifications, no invasive cancer, pathologic complete response, margins negative, 0/3 lymph nodes negative, previously ER 80%, PR 40%, HER2 positive, Ki-67 25% 3. Followed by adjuvant radiation therapystarted 02/04/2021 4.Followed by adjuvant antiestrogen therapy Patient [place]. URCCnausea study --------------------------------------------------------------------------------------------------------------------------------------- Profound intermittent rash with skin exfoliation on the face and puffiness of the face started March 2023 Recommendation: Stopped anastrozole 10/22/2020 and treated with Medrol Dosepak

## 2021-11-19 ENCOUNTER — Inpatient Hospital Stay: Payer: BC Managed Care – PPO | Attending: Hematology and Oncology | Admitting: Hematology and Oncology

## 2021-11-19 DIAGNOSIS — Z170 Estrogen receptor positive status [ER+]: Secondary | ICD-10-CM | POA: Diagnosis not present

## 2021-11-19 DIAGNOSIS — C50411 Malignant neoplasm of upper-outer quadrant of right female breast: Secondary | ICD-10-CM | POA: Diagnosis not present

## 2021-11-19 MED ORDER — EXEMESTANE 25 MG PO TABS: 25.0000 mg | ORAL_TABLET | Freq: Every day | ORAL | 3 refills | Status: DC

## 2021-11-20 ENCOUNTER — Ambulatory Visit: Payer: BC Managed Care – PPO | Attending: Adult Health | Admitting: Physical Therapy

## 2021-11-20 DIAGNOSIS — R279 Unspecified lack of coordination: Secondary | ICD-10-CM | POA: Diagnosis present

## 2021-11-20 DIAGNOSIS — R293 Abnormal posture: Secondary | ICD-10-CM | POA: Insufficient documentation

## 2021-11-20 DIAGNOSIS — M6281 Muscle weakness (generalized): Secondary | ICD-10-CM | POA: Insufficient documentation

## 2021-11-20 NOTE — Therapy (Signed)
OUTPATIENT PHYSICAL THERAPY TREATMENT NOTE   Patient Name: Sandy Goodwin MRN: [id number] DOB:[birth year], 51 y.o., female Today's Date: 11/20/2021  PCP: Caryl Bis, MD REFERRING PROVIDER: Gardenia Phlegm, NP  END OF SESSION:   PT End of Session - 11/20/21 1226     Visit Number 6    Number of Visits 10    Date for PT Re-Evaluation 02/20/22    Authorization Type BCBS    PT Start Time 1146    PT Stop Time 1228    PT Time Calculation (min) 42 min    Activity Tolerance Patient tolerated treatment well    Behavior During Therapy [facility] for tasks assessed/performed                 Past Medical History:  Diagnosis Date   Arthritis    Breast cancer ([place])    Complication of anesthesia    Itching   Dysrhythmia    Tachycardia   Hypothyroidism    Past Surgical History:  Procedure Laterality Date   ABDOMINAL HYSTERECTOMY     APPENDECTOMY  2008   Bowel Rupture Repair  2008   Ruptured during appendectomy   Breast Biopsy Right 1999   BREAST LUMPECTOMY WITH RADIOACTIVE SEED AND SENTINEL LYMPH NODE BIOPSY Right 12/11/2020   Procedure: RIGHT BREAST LUMPECTOMY WITH RADIOACTIVE SEED X2 AND RIGHT AXILLARY SENTINEL LYMPH NODE BIOPSY;  Surgeon: Rolm Bookbinder, MD;  Location: [place];  Service: General;  Laterality: Right;   BREAST RECONSTRUCTION Right 12/20/2020   Procedure: RIGHT ONCOPLASTIC RECONSTRUCTION;  Surgeon: Irene Limbo, MD;  Location: [place];  Service: Plastics;  Laterality: Right;   BREAST REDUCTION SURGERY Left 12/20/2020   Procedure: LEFT BREAST REDUCTION;  Surgeon: Irene Limbo, MD;  Location: [place];  Service: Plastics;  Laterality: Left;   COLONOSCOPY N/A 06/15/2016   Procedure: COLONOSCOPY;  Surgeon: Rogene Houston, MD;  [facility];  Service: Endoscopy;  Laterality: N/A;  845   IBF  [id number]   INCISION AND DRAINAGE OF WOUND Right 01/10/2021   Procedure: INCISION AND DRAINAGE OF  RIGHT AXILLA;  Surgeon: Irene Limbo, MD;  Location: [place];  Service: Plastics;  Laterality: Right;   PORTACATH PLACEMENT Left 07/23/2020   Procedure: INSERTION PORT-A-CATH;  Surgeon: Rolm Bookbinder, MD;  Location: [place];  Service: General;  Laterality: Left;   THYROIDECTOMY, PARTIAL Bilateral 2012   Patient Active Problem List   Diagnosis Date Noted   Uterine prolapse 03/20/2021   Polyarthralgia 03/20/2021   Port-A-Cath in place 02/06/2021   Genetic testing 07/17/2020   Malignant neoplasm of upper-outer quadrant of right breast in female, estrogen receptor positive ([place]) 07/12/2020   Change in bowel habits 06/12/2016   Mixed hyperlipidemia 01/07/2016   Hypothyroidism, unspecified 01/07/2016   Allergic rhinitis 04/02/2015   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 Pt does still have leakage with sneezing, coughing and jumping.   PAIN:  Are you having pain? No   SUBJECTIVE 09/01/21:  Fluid intake: Yes: a lot of water     Patient confirms identification and approves PT to assess pelvic floor and treatment Yes     PAIN:  Are you having pain? No     PRECAUTIONS: None   WEIGHT BEARING RESTRICTIONS No   FALLS:   Has patient fallen in last 6 months? 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   PLOF: Independent   PATIENT GOALS improving vaginal dryness/incontinence  PERTINENT HISTORY:  Breast cancer, abdominal hysterectomy, appendectomy, bowel rupture repair, lichens simplex Sexual abuse: No   BOWEL MOVEMENT Pain with bowel movement: No Type of bowel movement:Frequency 1x/day with Miralax and Strain Yes Takes Miralax most days  Fully empty rectum: Yes: - Leakage: Yes: occasional, unsure of what causes it, started after posterior grade 3 year Pads: No Fiber supplement: No   URINATION Pain with urination: No Fully empty bladder: No Stream:  WNL Urgency: Yes: no time to several minutes Frequency: every couple of hours - when at work much less Leakage: Urge to void, Walking to the bathroom, Coughing, Sneezing, and Laughing - also post-void dribble Pads: No - tries not too due to skin irritation   INTERCOURSE Pain with intercourse:  neuroma for a year after first delivery - no pain since until cancer treatment. Not currently attempting Ability to have vaginal penetration:  No Climax: Not attempted since cancer treatment     PREGNANCY Vaginal deliveries 2 Tearing Yes: Grade 3 and an anterior tear with second delivery C-section deliveries 0 Currently pregnant No   PROLAPSE She does have pessary,but is not using due to discomfort with dryness. She does feel vaginal bulge.       OBJECTIVE:    09/01/21 COGNITION:            Overall cognitive status: Within functional limits for tasks assessed                          SENSATION:            Light touch: Appears intact            Proprioception: Appears intact   GAIT:   Comments: WNL   POSTURE:  WNL   PELVIC MMT:  Strength 1/5, no endurance         PALPATION:   General  no abdominal tenderness; restriction throughout bil lower abdomen                  External Perineal Exam vulvar/clitoral hood changes consistent with decreased estrogen                             Internal Pelvic Floor perineal scar tissue restriction/tenderness, restricted urethral mobility Rt, tenderness throughout superficial/deep pelvic floor    TONE: Pelvic floor atrophy   PROLAPSE: Grade 2 anterior vaginal wall laxity   TODAY'S TREATMENT   11/20/2021:  Constance Haw with ball squeezes x10 Hamstring stretching bil as pt reports cramping felt in Lt hamstring and then needing extra time to release this Same side knee/hand ball press 2x10 Seated hip abduction blue loop 2x10 Seated hip flexion blue loop 2x10 Squats 2x10 10# Deadlifts 2x10 10# Farmers carry 10# and 5# 500' (3rd lap reports slight pressure at prolapse but able to stand and do a few quick flicks and had relief) Pt given and reviewed prolapse  relief positions   11/06/21 Manual: Myofascial release: Bladder/lower abdominal release Exercises: Stretches/mobility: Strengthening: Seated hip IR with adduction, red band/ball, 3 x 10 Seated clam shells 3 x 10 blue band Sidestepping red band 2 laps Squats with green band 2 x 10     PATIENT EDUCATION:  Education details: Exercise progressions.  Person educated: Patient Education method: Explanation, Demonstration, Tactile cues, Verbal cues, and Handouts Education comprehension: verbalized understanding     HOME EXERCISE PROGRAM: [id number]   ASSESSMENT:   CLINICAL IMPRESSION: Pt reports she is now able to feel pelvic floor contractions in standing much more consistently, has had less prolapse symptoms but still there often, and pain with intercourse still present. Pt session focused on NMRE with coordination of pelvic floor and breathing with all exercises and pt only had one instance of prolapse sensation in standing with 3rd of 4 laps of farmer's carry. better balance when band used for core facilitation. Pt given prolapse relief positions  as well to attempt. She will benefit from skilled PT intervention in order to improve vulvar/vaginal comfort, decrease symptoms of prolapse, improve core/pelvic floor strength, and create HEP that prevents symptoms of prolapse from progressing.      OBJECTIVE IMPAIRMENTS decreased activity tolerance, decreased coordination, decreased endurance, decreased mobility, decreased ROM, decreased strength, increased fascial restrictions, increased muscle spasms, and pain.    ACTIVITY LIMITATIONS  hygiene, toileting   PARTICIPATION LIMITATIONS: interpersonal relationship, community activity, and occupation   PERSONAL FACTORS 3+ comorbidities: Breast cancer, vaginal hysterectomy, appendectomy, bowel rupture repair, lichens simplex  are also affecting patient's functional outcome.    REHAB POTENTIAL: Good   CLINICAL DECISION MAKING: Stable/uncomplicated   EVALUATION COMPLEXITY: Low     GOALS: Goals reviewed with patient? Yes   SHORT TERM GOALS: Target date: 09/29/2021 - updated 10/13/21   Pt will be independent with HEP.    Baseline: Goal status: MET 10/06/21   2.  Pt will be able to correctly perform diaphragmatic breathing and appropriate pressure management in order to prevent worsening vaginal wall laxity and improve pelvic floor A/ROM.      Baseline:  Goal status: MET 10/13/21       LONG TERM GOALS: Target date: 11/24/2021 - updated 10/13/21   Pt will be independent with advanced HEP.    Baseline:  Goal status: IN PROGRESS   2.  Pt will demonstrate normal pelvic floor muscle tone and A/ROM, able to achieve 3/5 strength with contractions and 10 sec endurance, in order to provide appropriate lumbopelvic support in functional activities.    Baseline: patient has contractions where strength has improved to 2/5, but cuing required Goal status: IN PROGRESS   3.  Patient will be independent with vulvovaginal massage and report improved vaginal dryness.  Baseline:  Goal status: MET  10/06/21   4.  Pt will report 0/10 pain with vaginal penetration in order to improve intimate relationship with partner.     Baseline: Pt had intercourse for the first time since treatment, reports some pain - did not use lubricant Goal status: IN PROGRESS   5.  Pt will report no leaks with laughing, coughing, sneezing in order to improve comfort with interpersonal relationships and community activities.    Baseline:  Goal status: IN PROGRESS   6. Pt will be able to go 2-3 hours in between voids without urgency or incontinence in order to improve QOL and perform all functional activities with less difficulty.    Baseline:  Goal status: IN PROGRESS  PLAN: PT FREQUENCY: 1x/week   PT DURATION: 12 weeks   PLANNED INTERVENTIONS: Therapeutic exercises, Therapeutic activity, Neuromuscular re-education, Balance training, Gait training, Patient/Family education, Joint mobilization, Dry Needling, Biofeedback, and Manual therapy   PLAN FOR NEXT SESSION: Progress strengthening/mobility exercises.   Stacy Gardner, PT, DPT 11/21/2310:33 PM

## 2021-11-21 ENCOUNTER — Encounter: Payer: Self-pay | Admitting: Obstetrics and Gynecology

## 2021-11-21 ENCOUNTER — Ambulatory Visit (INDEPENDENT_AMBULATORY_CARE_PROVIDER_SITE_OTHER): Payer: BC Managed Care – PPO | Admitting: Obstetrics and Gynecology

## 2021-11-21 VITALS — BP 92/69 | HR 99

## 2021-11-21 DIAGNOSIS — N393 Stress incontinence (female) (male): Secondary | ICD-10-CM

## 2021-11-21 DIAGNOSIS — N952 Postmenopausal atrophic vaginitis: Secondary | ICD-10-CM | POA: Diagnosis not present

## 2021-11-21 DIAGNOSIS — N816 Rectocele: Secondary | ICD-10-CM | POA: Diagnosis not present

## 2021-11-21 MED ORDER — ESTRADIOL 0.1 MG/GM VA CREA: TOPICAL_CREAM | VAGINAL | 11 refills | Status: DC

## 2021-11-21 NOTE — Progress Notes (Signed)
[facility] Return Visit  SUBJECTIVE  History of Present Illness: Cianna R Grogan is a 51 y.o. female seen in follow-up for prolapse and incontinence.  Will plan for simple CMG testing today and to discuss plan for surgery.   Past Medical History: Patient  has a past medical history of Arthritis, Breast cancer ([place]), Complication of anesthesia, Dysrhythmia, and Hypothyroidism.   Past Surgical History: She  has a past surgical history that includes Thyroidectomy, partial (Bilateral, 2012); Appendectomy (2008); Bowel Rupture Repair (2008); Breast Biopsy (Right, 1999); IBF ([id number]); Colonoscopy (N/A, 06/15/2016); Abdominal hysterectomy; Portacath placement (Left, 07/23/2020); Breast lumpectomy with radioactive seed and sentinel lymph node biopsy (Right, 12/11/2020); Breast reconstruction (Right, 12/20/2020); Breast reduction surgery (Left, 12/20/2020); and Incision and drainage of wound (Right, 01/10/2021).   Medications: She has a current medication list which includes the following prescription(s): [START ON 11/24/2021] estradiol, bystolic, cetirizine, clobetasol ointment, exemestane, levothyroxine, lorazepam, methylprednisolone, metronidazole, pantoprazole, triamcinolone, turmeric, and valacyclovir.   Allergies: Patient is allergic to codeine, hyaluronic acid [sodium hyaluronate], and hydrocodone.   Social History: Patient  reports that she has never smoked. She has never used smokeless tobacco. She reports that she does not drink alcohol and does not use drugs.      OBJECTIVE     Physical Exam: Vitals:   11/21/21 0803  BP: 92/69  Pulse: 99   Gen: No apparent distress, A&O x 3.  Detailed Urogynecologic Evaluation:  Deferred. Prior exam showed:  POP-Q:    POP-Q   -2                                            Aa   -2                                           Ba   -6                                              C    4.5                                            Gh    4                                            Pb   8.5                                            tvl    0                                            Ap   0  Bp                                                  D     Verbal consent was obtained to perform simple CMG procedure:   Prolapse was reduced using 2 large cotton swabs. Urethra was prepped with betadine and a 56F catheter was placed and bladder was drained completely. The bladder was then backfilled with sterile water by gravity.  First sensation: 177m First Desire: 2893mStrong Desire: 40077mapacity: finished filling with 500m12mttle Cough stress test was negative. Valsalva stress test was negative. This was performed in both the sitting and standing positions. She was was allowed to void on her own.   Interpretation: CMG showed normal sensation, and normal cystometric capacity. Findings negative for stress incontinence, negative for detrusor overactivity.     ASSESSMENT AND PLAN    Ms. BoydKrammea 50 y77. with:  1. Vaginal atrophy   2. Prolapse of posterior vaginal wall     Plan for surgery: Exam under anesthesia, posterior repair, perineorrhaphy, sacrospinous ligament fixation, anal sphincteroplasty, possible midurethral sling, cystoscopy  -We discussed today that the simple CMG testing did not show stress incontinence with several attempts in the sitting and standing positions with the prolapse reduced.  - Reviewed option of proceeding with surgery for prolapse without correction of incontinence or proceed with urodynamic testing to further evaluate her leakage. She would like to proceed with urodynamic testing as the incontinence is bothersome. Will have her fill out a 3 day bladder diary prior.  - She has spoke with her oncologist regarding vaginal estrogen and they have made the decision that it would be safe to proceed with this. Prescribed estrace cream 0.5g nightly for  two weeks then twice a week after.   Return for urodynamics.    MichJaquita Folds

## 2021-11-21 NOTE — Patient Instructions (Signed)
Start vaginal estrogen therapy nightly for two weeks then 2 times weekly at night for treatment of vaginal atrophy (dryness of the vaginal tissues).  Please let us know if the prescription is too expensive and we can look for alternative options.    URODYNAMICS (UDS) TEST INFORMATION  IMPORTANT: Please try to arrive with a comfortably full bladder!  Complete a 3 day bladder diary prior to your appointment.  What is UDS? Urodynamics is a bladder test used to evaluate how your bladder and urethra (tube you urinate out of) work to help find out the cause of your bladder symptoms and evaluate your bladder function in order to make the best treatment plan for you.   What to expect? A nurse will perform the test and will be with you during the entire exam. First we will have to empty your bladder on a special toilet.  After you have emptied your bladder, very small catheters (plastic tubing) will be placed into your bladder and into your vagina (or rectum). These special small catheters measure pressure to help measure your bladder function.  Your bladder will be gently filled with water and you will be asked to cough and strain at several different points during the test.   You will then be asked to empty your bladder in the special toilet with the catheters in place. Most patients can urinate (pee) easily with the catheters in place since the catheters are so small. In total this procedure lasts about 45 minutes to 1 hour.  After your test is completed, you will return (or possibly be seen the same day) to review the results, talk about treatment options and make a plan moving forward.

## 2021-12-08 ENCOUNTER — Encounter: Payer: Self-pay | Admitting: Hematology and Oncology

## 2021-12-09 ENCOUNTER — Ambulatory Visit (INDEPENDENT_AMBULATORY_CARE_PROVIDER_SITE_OTHER): Payer: BC Managed Care – PPO | Admitting: Obstetrics and Gynecology

## 2021-12-09 VITALS — BP 115/77 | HR 74 | Ht 66.0 in | Wt 189.0 lb

## 2021-12-09 DIAGNOSIS — R350 Frequency of micturition: Secondary | ICD-10-CM | POA: Diagnosis not present

## 2021-12-09 DIAGNOSIS — N3946 Mixed incontinence: Secondary | ICD-10-CM

## 2021-12-09 LAB — POCT URINALYSIS DIPSTICK
Appearance: NORMAL
Bilirubin, UA: NEGATIVE
Blood, UA: NEGATIVE
Glucose, UA: NEGATIVE
Ketones, UA: NEGATIVE
Leukocytes, UA: NEGATIVE
Nitrite, UA: NEGATIVE
Protein, UA: NEGATIVE
Spec Grav, UA: 1.01 (ref 1.010–1.025)
Urobilinogen, UA: 0.2 E.U./dL
pH, UA: 6 (ref 5.0–8.0)

## 2021-12-09 NOTE — Patient Instructions (Signed)

## 2021-12-12 ENCOUNTER — Ambulatory Visit: Payer: BC Managed Care – PPO | Attending: Adult Health | Admitting: Physical Therapy

## 2021-12-12 DIAGNOSIS — M62838 Other muscle spasm: Secondary | ICD-10-CM | POA: Insufficient documentation

## 2021-12-12 DIAGNOSIS — R279 Unspecified lack of coordination: Secondary | ICD-10-CM | POA: Insufficient documentation

## 2021-12-12 DIAGNOSIS — M6281 Muscle weakness (generalized): Secondary | ICD-10-CM | POA: Insufficient documentation

## 2021-12-12 DIAGNOSIS — R293 Abnormal posture: Secondary | ICD-10-CM | POA: Insufficient documentation

## 2021-12-12 DIAGNOSIS — Z483 Aftercare following surgery for neoplasm: Secondary | ICD-10-CM | POA: Diagnosis present

## 2021-12-12 NOTE — Therapy (Signed)
OUTPATIENT PHYSICAL THERAPY TREATMENT NOTE   Patient Name: Sandy Goodwin MRN: [id number] DOB:01/24/1971, 51 y.o., female Today's Date: 12/12/2021  PCP: Caryl Bis, MD REFERRING PROVIDER: Gardenia Phlegm, NP  END OF SESSION:   PT End of Session - 12/12/21 0759     Visit Number 14   total including CA visits and PF - PF6   Number of Visits --    Date for PT Re-Evaluation 02/20/22    Authorization Type BCBS    PT Start Time 0800    PT Stop Time 0843    PT Time Calculation (min) 43 min    Activity Tolerance Patient tolerated treatment well    Behavior During Therapy [facility] for tasks assessed/performed                 Past Medical History:  Diagnosis Date   Arthritis    Breast cancer ([place])    Complication of anesthesia    Itching   Dysrhythmia    Tachycardia   Hypothyroidism    Past Surgical History:  Procedure Laterality Date   ABDOMINAL HYSTERECTOMY     APPENDECTOMY  2008   Bowel Rupture Repair  2008   Ruptured during appendectomy   Breast Biopsy Right 1999   BREAST LUMPECTOMY WITH RADIOACTIVE SEED AND SENTINEL LYMPH NODE BIOPSY Right 12/11/2020   Procedure: RIGHT BREAST LUMPECTOMY WITH RADIOACTIVE SEED X2 AND RIGHT AXILLARY SENTINEL LYMPH NODE BIOPSY;  Surgeon: Rolm Bookbinder, MD;  Location: [place];  Service: General;  Laterality: Right;   BREAST RECONSTRUCTION Right 12/20/2020   Procedure: RIGHT ONCOPLASTIC RECONSTRUCTION;  Surgeon: Irene Limbo, MD;  Location: [place];  Service: Plastics;  Laterality: Right;   BREAST REDUCTION SURGERY Left 12/20/2020   Procedure: LEFT BREAST REDUCTION;  Surgeon: Irene Limbo, MD;  Location: [place];  Service: Plastics;  Laterality: Left;   COLONOSCOPY N/A 06/15/2016   Procedure: COLONOSCOPY;  Surgeon: Rogene Houston, MD;  [facility];  Service: Endoscopy;  Laterality: N/A;  845   IBF  [id number]   INCISION AND DRAINAGE OF WOUND Right 01/10/2021    Procedure: INCISION AND DRAINAGE OF RIGHT AXILLA;  Surgeon: Irene Limbo, MD;  Location: [place];  Service: Plastics;  Laterality: Right;   PORTACATH PLACEMENT Left 07/23/2020   Procedure: INSERTION PORT-A-CATH;  Surgeon: Rolm Bookbinder, MD;  Location: [place];  Service: General;  Laterality: Left;   THYROIDECTOMY, PARTIAL Bilateral 2012   Patient Active Problem List   Diagnosis Date Noted   Uterine prolapse 03/20/2021   Polyarthralgia 03/20/2021   Port-A-Cath in place 02/06/2021   Genetic testing 07/17/2020   Malignant neoplasm of upper-outer quadrant of right breast in female, estrogen receptor positive ([place]) 07/12/2020   Change in bowel habits 06/12/2016   Mixed hyperlipidemia 01/07/2016   Hypothyroidism, unspecified 01/07/2016   Allergic rhinitis 04/02/2015   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    REFERRING DIAG: C50.411,Z17.0 (ICD-10-CM) - Malignant neoplasm of upper-outer quadrant of right breast in female, estrogen receptor positive ([place])  THERAPY DIAG:  Muscle weakness (generalized)  Unspecified lack of coordination  Abnormal posture  Rationale for Evaluation and Treatment Rehabilitation  PERTINENT HISTORY: Breast cancer, abdominal hysterectomy, appendectomy, bowel rupture repair, lichens simplex  PRECAUTIONS: NA  SUBJECTIVE: Pt reports she has started something new for dryness and can't tell if this is helping yet or not, V-magic has helped a lot but was still having some pain with intercourse. Leakage mostly with jumping now.  PAIN:  Are you having pain? No   SUBJECTIVE 09/01/21:                                                                                                                                                                                            Fluid intake: Yes: a lot of water     Patient confirms identification and approves PT to assess pelvic floor and treatment Yes     PAIN:  Are you  having pain? No     PRECAUTIONS: None   WEIGHT BEARING RESTRICTIONS No   FALLS:  Has patient fallen in last 6 months? 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   PLOF: Independent   PATIENT GOALS improving vaginal dryness/incontinence  PERTINENT HISTORY:  Breast cancer, abdominal hysterectomy, appendectomy, bowel rupture repair, lichens simplex Sexual abuse: No   BOWEL MOVEMENT Pain with bowel movement: No Type of bowel movement:Frequency 1x/day with Miralax and Strain Yes Takes Miralax most days  Fully empty rectum: Yes: - Leakage: Yes: occasional, unsure of what causes it, started after posterior grade 3 year Pads: No Fiber supplement: No   URINATION Pain with urination: No Fully empty bladder: No Stream:  WNL Urgency: Yes: no time to several minutes Frequency: every couple of hours - when at work much less Leakage: Urge to void, Walking to the bathroom, Coughing, Sneezing, and Laughing - also post-void dribble Pads: No - tries not too due to skin irritation   INTERCOURSE Pain with intercourse:  neuroma for a year after first delivery - no pain since until cancer treatment. Not currently attempting Ability to have vaginal penetration:  No Climax: Not attempted since cancer treatment     PREGNANCY Vaginal deliveries 2 Tearing Yes: Grade 3 and an anterior tear with second delivery C-section deliveries 0 Currently pregnant No   PROLAPSE No longer wearing pessary - reports they do not stay in place and not helpful.        OBJECTIVE:    09/01/21 COGNITION:            Overall cognitive status: Within functional limits for tasks assessed                          SENSATION:            Light touch: Appears intact            Proprioception: Appears intact   GAIT:   Comments: WNL   POSTURE:  WNL   PELVIC MMT:  Strength 1/5,  no endurance         PALPATION:   General  no  abdominal tenderness; restriction throughout bil lower abdomen                 External Perineal Exam vulvar/clitoral hood changes consistent with decreased estrogen                             Internal Pelvic Floor perineal scar tissue restriction/tenderness, restricted urethral mobility Rt, tenderness throughout superficial/deep pelvic floor    TONE: Pelvic floor atrophy   PROLAPSE: Grade 2 anterior vaginal wall laxity   TODAY'S TREATMENT   12/12/2021: NMRE: Pt consented to internal vaginal re-assessment and treatment this date and found to have improvement with strength to 3/5, 3s isometrics, and 3 reps. Pt denied pain with palpation internally. Pt tolerated well and reports she is hopeful internal moisturizer MD started has helped.  2x10 pelvic floor contractions, x5 isometrics 3s (pt's max), 4Q68 quick flicks NMRE: all exercises cued for breathing and pelvic floor contraction coordination for improved pressure management for prolapse and decreased leakage 2x10 squats 10# Lead leg on bosu, back on ground x10 mini lunges x10 each Wide leg squats x10 body weight Red band palloffs 2x10 Bridges with ball squeeze 2x10 Opp hand/knee ball press 2x10  11/20/2021:  Bridges with ball squeezes x10 Hamstring stretching bil as pt reports cramping felt in Lt hamstring and then needing extra time to release this Same side knee/hand ball press 2x10 Seated hip abduction blue loop 2x10 Seated hip flexion blue loop 2x10 Squats 2x10 10# Deadlifts 2x10 10# Farmers carry 10# and 5# 500' (3rd lap reports slight pressure at prolapse but able to stand and do a few quick flicks and had relief) Pt given and reviewed prolapse relief positions       PATIENT EDUCATION:  Education details: Exercise progressions.  Person educated: Patient Education method: Explanation, Demonstration, Tactile cues, Verbal cues, and Handouts Education comprehension: verbalized understanding     HOME EXERCISE  PROGRAM: [id number]   ASSESSMENT:   CLINICAL IMPRESSION: Pt reports overall pain with intercourse has improved but still present slightly, started new moisturizer from MD and hopeful this helps. Pt does report leakage has been a little better, now only noting it more with jumping and prolapse symptoms vary on the day sometimes more bothersome than others. Session focused on coordination of pelvic floor and breathing with all strengthening tasks for improved pressure management and decreased strain at pelvic floor. Pt also consented to internal treatment today and improving with strength overall since eval pt pleased with this, having better strength, coordination, endurance, and did have tenderness during palpation. She will benefit from skilled PT intervention in order to improve vulvar/vaginal comfort, decrease symptoms of prolapse, improve core/pelvic floor strength, and create HEP that prevents symptoms of prolapse from progressing.      OBJECTIVE IMPAIRMENTS decreased activity tolerance, decreased coordination, decreased endurance, decreased mobility, decreased ROM, decreased strength, increased fascial restrictions, increased muscle spasms, and pain.    ACTIVITY LIMITATIONS  hygiene, toileting   PARTICIPATION LIMITATIONS: interpersonal relationship, community activity, and occupation   PERSONAL FACTORS 3+ comorbidities: Breast cancer, vaginal hysterectomy, appendectomy, bowel rupture repair, lichens simplex  are also affecting patient's functional outcome.    REHAB POTENTIAL: Good   CLINICAL DECISION MAKING: Stable/uncomplicated   EVALUATION COMPLEXITY: Low     GOALS: Goals reviewed with patient? Yes   SHORT TERM GOALS: Target date: 09/29/2021 - updated 10/13/21  Pt will be independent with HEP.    Baseline: Goal status: MET 10/06/21   2.  Pt will be able to correctly perform diaphragmatic breathing and appropriate pressure management in order to prevent worsening vaginal wall  laxity and improve pelvic floor A/ROM.      Baseline:  Goal status: MET 10/13/21       LONG TERM GOALS: Target date: 11/24/2021 - updated 10/13/21   Pt will be independent with advanced HEP.    Baseline:  Goal status: IN PROGRESS   2.  Pt will demonstrate normal pelvic floor muscle tone and A/ROM, able to achieve 3/5 strength with contractions and 10 sec endurance, in order to provide appropriate lumbopelvic support in functional activities.    Baseline: patient has contractions where strength has improved to 2/5, but cuing required Goal status: IN PROGRESS   3.  Patient will be independent with vulvovaginal massage and report improved vaginal dryness.  Baseline:  Goal status: MET 10/06/21   4.  Pt will report 0/10 pain with vaginal penetration in order to improve intimate relationship with partner.     Baseline: Pt had intercourse for the first time since treatment, reports some pain - did not use lubricant Goal status: IN PROGRESS   5.  Pt will report no leaks with laughing, coughing, sneezing in order to improve comfort with interpersonal relationships and community activities.    Baseline:  Goal status: IN PROGRESS   6. Pt will be able to go 2-3 hours in between voids without urgency or incontinence in order to improve QOL and perform all functional activities with less difficulty.    Baseline:  Goal status: IN PROGRESS     PLAN: PT FREQUENCY: 1x/week   PT DURATION: 12 weeks   PLANNED INTERVENTIONS: Therapeutic exercises, Therapeutic activity, Neuromuscular re-education, Balance training, Gait training, Patient/Family education, Joint mobilization, Dry Needling, Biofeedback, and Manual therapy   PLAN FOR NEXT SESSION: Progress strengthening/mobility exercises.   Stacy Gardner, PT, DPT 09/01/238:43 AM

## 2021-12-18 ENCOUNTER — Ambulatory Visit
Admission: RE | Admit: 2021-12-18 | Discharge: 2021-12-18 | Disposition: A | Discharge status: Home / Self Care | Payer: BC Managed Care – PPO | Source: Ambulatory Visit | Attending: Adult Health | Admitting: Adult Health

## 2021-12-18 ENCOUNTER — Ambulatory Visit: Payer: BC Managed Care – PPO | Admitting: Physical Therapy

## 2021-12-18 DIAGNOSIS — R293 Abnormal posture: Secondary | ICD-10-CM

## 2021-12-18 DIAGNOSIS — M6281 Muscle weakness (generalized): Secondary | ICD-10-CM | POA: Diagnosis not present

## 2021-12-18 DIAGNOSIS — R279 Unspecified lack of coordination: Secondary | ICD-10-CM

## 2021-12-18 DIAGNOSIS — Z170 Estrogen receptor positive status [ER+]: Secondary | ICD-10-CM

## 2021-12-18 NOTE — Therapy (Signed)
OUTPATIENT PHYSICAL THERAPY TREATMENT NOTE   Patient Name: Sandy Goodwin MRN: [id number] DOB:10-Dec-1970, 51 y.o., female Today's Date: 12/18/2021  PCP: Caryl Bis, MD REFERRING PROVIDER: Gardenia Phlegm, NP  END OF SESSION:   PT End of Session - 12/18/21 0931     Visit Number 15   total including CA visits and PF - PF7   Date for PT Re-Evaluation 02/20/22    Authorization Type BCBS    PT Start Time 0930    PT Stop Time 1010    PT Time Calculation (min) 40 min    Activity Tolerance Patient tolerated treatment well    Behavior During Therapy [facility] for tasks assessed/performed                 Past Medical History:  Diagnosis Date   Arthritis    Breast cancer ([place])    Complication of anesthesia    Itching   Dysrhythmia    Tachycardia   Hypothyroidism    Past Surgical History:  Procedure Laterality Date   ABDOMINAL HYSTERECTOMY     APPENDECTOMY  2008   Bowel Rupture Repair  2008   Ruptured during appendectomy   Breast Biopsy Right 1999   BREAST LUMPECTOMY WITH RADIOACTIVE SEED AND SENTINEL LYMPH NODE BIOPSY Right 12/11/2020   Procedure: RIGHT BREAST LUMPECTOMY WITH RADIOACTIVE SEED X2 AND RIGHT AXILLARY SENTINEL LYMPH NODE BIOPSY;  Surgeon: Rolm Bookbinder, MD;  Location: [place];  Service: General;  Laterality: Right;   BREAST RECONSTRUCTION Right 12/20/2020   Procedure: RIGHT ONCOPLASTIC RECONSTRUCTION;  Surgeon: Irene Limbo, MD;  Location: [place];  Service: Plastics;  Laterality: Right;   BREAST REDUCTION SURGERY Left 12/20/2020   Procedure: LEFT BREAST REDUCTION;  Surgeon: Irene Limbo, MD;  Location: [place];  Service: Plastics;  Laterality: Left;   COLONOSCOPY N/A 06/15/2016   Procedure: COLONOSCOPY;  Surgeon: Rogene Houston, MD;  [facility];  Service: Endoscopy;  Laterality: N/A;  845   IBF  [id number]   INCISION AND DRAINAGE OF WOUND Right 01/10/2021   Procedure: INCISION  AND DRAINAGE OF RIGHT AXILLA;  Surgeon: Irene Limbo, MD;  Location: [place];  Service: Plastics;  Laterality: Right;   PORTACATH PLACEMENT Left 07/23/2020   Procedure: INSERTION PORT-A-CATH;  Surgeon: Rolm Bookbinder, MD;  Location: [place];  Service: General;  Laterality: Left;   THYROIDECTOMY, PARTIAL Bilateral 2012   Patient Active Problem List   Diagnosis Date Noted   Uterine prolapse 03/20/2021   Polyarthralgia 03/20/2021   Port-A-Cath in place 02/06/2021   Genetic testing 07/17/2020   Malignant neoplasm of upper-outer quadrant of right breast in female, estrogen receptor positive ([place]) 07/12/2020   Change in bowel habits 06/12/2016   Mixed hyperlipidemia 01/07/2016   Hypothyroidism, unspecified 01/07/2016   Allergic rhinitis 04/02/2015   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 Prolapse symptoms most noticeable with standing and prolonged walking and standing and with bowel movements.   PAIN:  Are you having pain?  No   SUBJECTIVE 09/01/21:                                                                                                                                                                                            Fluid intake: Yes: a lot of water     Patient confirms identification and approves PT to assess pelvic floor and treatment Yes     PAIN:  Are  you having pain? No     PRECAUTIONS: None   WEIGHT BEARING RESTRICTIONS No   FALLS:  Has patient fallen in last 6 months? 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   PLOF: Independent   PATIENT GOALS improving vaginal dryness/incontinence  PERTINENT HISTORY:  Breast cancer, abdominal hysterectomy, appendectomy, bowel rupture repair, lichens simplex Sexual abuse: No   BOWEL MOVEMENT Pain with bowel movement: No Type of bowel movement:Frequency 1x/day with Miralax and Strain Yes Takes Miralax most days  Fully empty rectum: Yes: - Leakage: Yes: occasional, unsure of what causes it, started after posterior grade 3 year Pads: No Fiber supplement: No   URINATION Pain with urination: No Fully empty bladder: No Stream:  WNL Urgency: Yes: no time to several minutes Frequency: every couple of hours - when at work much less Leakage: Urge to void, Walking to the bathroom, Coughing, Sneezing, and Laughing - also post-void dribble Pads: No - tries not too due to skin irritation   INTERCOURSE Pain with intercourse:  neuroma for a year after first delivery - no pain since until cancer treatment. Not currently attempting Ability to have vaginal penetration:  No Climax: Not attempted since cancer treatment     PREGNANCY Vaginal deliveries 2 Tearing Yes: Grade 3 and an anterior tear with second delivery C-section deliveries 0 Currently pregnant No   PROLAPSE No longer wearing pessary - reports they do not stay in place and not helpful.        OBJECTIVE:    09/01/21 COGNITION:            Overall cognitive status: Within functional limits for tasks assessed                          SENSATION:            Light touch: Appears intact            Proprioception: Appears intact   GAIT:   Comments: WNL   POSTURE:  WNL   PELVIC MMT:  Strength 1/5, no endurance  PALPATION:   General   no abdominal tenderness; restriction throughout bil lower abdomen                 External Perineal Exam vulvar/clitoral hood changes consistent with decreased estrogen                             Internal Pelvic Floor perineal scar tissue restriction/tenderness, restricted urethral mobility Rt, tenderness throughout superficial/deep pelvic floor    TONE: Pelvic floor atrophy   PROLAPSE: Grade 2 anterior vaginal wall laxity   TODAY'S TREATMENT  12/18/2021: Therapeutic exercise: Hamstring stretching with strap 2x30s, piriformis stretch 2x30s NMRE: all exercises cued for breathing and pelvic floor contraction coordination for improved pressure management for prolapse and decreased leakage 2x10 bridges and ball squeezes Opp hand/knee ball press 2x10 Bear plank x10 Bird dog 2x10 Seated diagonals green band 2x10 each 2x10 squats 8# with 10 rt OHP, 10 lt OHP Yellow loop 2x20 lateral stepping     PATIENT EDUCATION:  Education details: Exercise progressions.  Person educated: Patient Education method: Explanation, Demonstration, Tactile cues, Verbal cues, and Handouts Education comprehension: verbalized understanding     HOME EXERCISE PROGRAM: [id number]   ASSESSMENT:   CLINICAL IMPRESSION: Pt reports symptoms are about the same, has noticed less leakage. Session focused on coordination of pelvic floor and breathing with all strengthening tasks for improved pressure management and decreased strain at pelvic floor. All exercises cued for coordination and emphasis on pressure management and pelvic floor activation during core strengthening today as pt reports she was a little sore after LE exercises last week. Pt tolerated well and needed cues intermittently for technique. She will benefit from skilled PT intervention in order to improve vulvar/vaginal comfort, decrease symptoms of prolapse, improve core/pelvic floor strength, and create HEP that prevents symptoms of prolapse from  progressing.      OBJECTIVE IMPAIRMENTS decreased activity tolerance, decreased coordination, decreased endurance, decreased mobility, decreased ROM, decreased strength, increased fascial restrictions, increased muscle spasms, and pain.    ACTIVITY LIMITATIONS  hygiene, toileting   PARTICIPATION LIMITATIONS: interpersonal relationship, community activity, and occupation   PERSONAL FACTORS 3+ comorbidities: Breast cancer, vaginal hysterectomy, appendectomy, bowel rupture repair, lichens simplex  are also affecting patient's functional outcome.    REHAB POTENTIAL: Good   CLINICAL DECISION MAKING: Stable/uncomplicated   EVALUATION COMPLEXITY: Low     GOALS: Goals reviewed with patient? Yes   SHORT TERM GOALS: Target date: 09/29/2021 - updated 10/13/21   Pt will be independent with HEP.    Baseline: Goal status: MET 10/06/21   2.  Pt will be able to correctly perform diaphragmatic breathing and appropriate pressure management in order to prevent worsening vaginal wall laxity and improve pelvic floor A/ROM.      Baseline:  Goal status: MET 10/13/21       LONG TERM GOALS: Target date: 11/24/2021 - updated 10/13/21   Pt will be independent with advanced HEP.    Baseline:  Goal status: IN PROGRESS   2.  Pt will demonstrate normal pelvic floor muscle tone and A/ROM, able to achieve 3/5 strength with contractions and 10 sec endurance, in order to provide appropriate lumbopelvic support in functional activities.    Baseline: patient has contractions where strength has improved to 2/5, but cuing required Goal status: IN PROGRESS   3.  Patient will be independent with vulvovaginal massage and report improved vaginal dryness.  Baseline:  Goal status: MET 10/06/21  4.  Pt will report 0/10 pain with vaginal penetration in order to improve intimate relationship with partner.     Baseline: Pt had intercourse for the first time since treatment, reports some pain - did not use  lubricant Goal status: IN PROGRESS   5.  Pt will report no leaks with laughing, coughing, sneezing in order to improve comfort with interpersonal relationships and community activities.    Baseline:  Goal status: IN PROGRESS   6. Pt will be able to go 2-3 hours in between voids without urgency or incontinence in order to improve QOL and perform all functional activities with less difficulty.    Baseline:  Goal status: IN PROGRESS     PLAN: PT FREQUENCY: 1x/week   PT DURATION: 12 weeks   PLANNED INTERVENTIONS: Therapeutic exercises, Therapeutic activity, Neuromuscular re-education, Balance training, Gait training, Patient/Family education, Joint mobilization, Dry Needling, Biofeedback, and Manual therapy   PLAN FOR NEXT SESSION: Progress strengthening/mobility exercises.   Stacy Gardner, PT, DPT 12/18/2308:10 AM

## 2021-12-22 ENCOUNTER — Telehealth: Payer: Self-pay

## 2021-12-22 NOTE — Telephone Encounter (Signed)
S/W pt per NP to make her aware of results. Educated pt on osteopenia and recommendation for calcium 1200 mg and vit d 2,000 units daily, as well as weight bearing exercises. Pt verbalized thanks and understanding and knows to call back with any further concerns.

## 2021-12-22 NOTE — Telephone Encounter (Signed)
-----   Message from Gardenia Phlegm, NP sent at 12/19/2021 12:16 PM EDT ----- Density is consistent with osteopenia.  I recommend calcium vitamin D weightbearing exercises. ----- Message ----- From: Interface, Rad Results In Sent: 12/18/2021   4:29 PM EDT To: Gardenia Phlegm, NP

## 2021-12-25 ENCOUNTER — Ambulatory Visit: Payer: BC Managed Care – PPO | Admitting: Physical Therapy

## 2021-12-25 DIAGNOSIS — M6281 Muscle weakness (generalized): Secondary | ICD-10-CM | POA: Diagnosis not present

## 2021-12-25 DIAGNOSIS — M62838 Other muscle spasm: Secondary | ICD-10-CM

## 2021-12-25 DIAGNOSIS — R279 Unspecified lack of coordination: Secondary | ICD-10-CM

## 2021-12-25 NOTE — Therapy (Signed)
OUTPATIENT PHYSICAL THERAPY TREATMENT NOTE   Patient Name: Sandy Goodwin MRN: [id number] DOB:04/17/1970, 51 y.o., female Today's Date: 12/25/2021  PCP: Caryl Bis, MD REFERRING PROVIDER: Gardenia Phlegm, NP  END OF SESSION:   PT End of Session - 12/25/21 0931     Visit Number 16   total including CA visits and PF - PF8   Date for PT Re-Evaluation 02/20/22    Authorization Type BCBS    PT Start Time 0930    PT Stop Time 1008    PT Time Calculation (min) 38 min    Activity Tolerance Patient tolerated treatment well    Behavior During Therapy WFL for tasks assessed/performed                 Past Medical History:  Diagnosis Date   Arthritis    Breast cancer ([place])    Complication of anesthesia    Itching   Dysrhythmia    Tachycardia   Hypothyroidism    Past Surgical History:  Procedure Laterality Date   ABDOMINAL HYSTERECTOMY     APPENDECTOMY  2008   Bowel Rupture Repair  2008   Ruptured during appendectomy   Breast Biopsy Right 1999   BREAST LUMPECTOMY WITH RADIOACTIVE SEED AND SENTINEL LYMPH NODE BIOPSY Right 12/11/2020   Procedure: RIGHT BREAST LUMPECTOMY WITH RADIOACTIVE SEED X2 AND RIGHT AXILLARY SENTINEL LYMPH NODE BIOPSY;  Surgeon: Rolm Bookbinder, MD;  Location: [facility];  Service: General;  Laterality: Right;   BREAST RECONSTRUCTION Right 12/20/2020   Procedure: RIGHT ONCOPLASTIC RECONSTRUCTION;  Surgeon: Irene Limbo, MD;  Location: [facility];  Service: Plastics;  Laterality: Right;   BREAST REDUCTION SURGERY Left 12/20/2020   Procedure: LEFT BREAST REDUCTION;  Surgeon: Irene Limbo, MD;  Location: [facility];  Service: Plastics;  Laterality: Left;   COLONOSCOPY N/A 06/15/2016   Procedure: COLONOSCOPY;  Surgeon: Rogene Houston, MD;  [facility];  Service: Endoscopy;  Laterality: N/A;  845   IBF  [id number]   INCISION AND DRAINAGE OF WOUND Right 01/10/2021   Procedure: INCISION  AND DRAINAGE OF RIGHT AXILLA;  Surgeon: Irene Limbo, MD;  Location: [facility];  Service: Plastics;  Laterality: Right;   PORTACATH PLACEMENT Left 07/23/2020   Procedure: INSERTION PORT-A-CATH;  Surgeon: Rolm Bookbinder, MD;  Location: [facility];  Service: General;  Laterality: Left;   THYROIDECTOMY, PARTIAL Bilateral 2012   Patient Active Problem List   Diagnosis Date Noted   Uterine prolapse 03/20/2021   Polyarthralgia 03/20/2021   Port-A-Cath in place 02/06/2021   Genetic testing 07/17/2020   Malignant neoplasm of upper-outer quadrant of right breast in female, estrogen receptor positive ([place]) 07/12/2020   Change in bowel habits 06/12/2016   Mixed hyperlipidemia 01/07/2016   Hypothyroidism, unspecified 01/07/2016   Allergic rhinitis 04/02/2015   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   PAIN:  Are you having pain? No   SUBJECTIVE 09/01/21:  Fluid intake: Yes: a lot of water     Patient confirms identification and approves PT to assess pelvic floor and treatment Yes     PAIN:  Are you having pain? No     PRECAUTIONS: None   WEIGHT BEARING RESTRICTIONS No   FALLS:  Has patient fallen in last 6  months? 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   PLOF: Independent   PATIENT GOALS improving vaginal dryness/incontinence  PERTINENT HISTORY:  Breast cancer, abdominal hysterectomy, appendectomy, bowel rupture repair, lichens simplex Sexual abuse: No   BOWEL MOVEMENT Pain with bowel movement: No Type of bowel movement:Frequency 1x/day with Miralax and Strain Yes Takes Miralax most days  Fully empty rectum: Yes: - Leakage: Yes: occasional, unsure of what causes it, started after posterior grade 3 year Pads: No Fiber supplement: No   URINATION Pain with urination: No Fully empty bladder: No Stream:  WNL Urgency: Yes: no time to several minutes Frequency: every couple of hours - when at work much less Leakage: Urge to void, Walking to the bathroom, Coughing, Sneezing, and Laughing - also post-void dribble Pads: No - tries not too due to skin irritation   INTERCOURSE Pain with intercourse:  neuroma for a year after first delivery - no pain since until cancer treatment. Not currently attempting Ability to have vaginal penetration:  No Climax: Not attempted since cancer treatment     PREGNANCY Vaginal deliveries 2 Tearing Yes: Grade 3 and an anterior tear with second delivery C-section deliveries 0 Currently pregnant No   PROLAPSE No longer wearing pessary - reports they do not stay in place and not helpful.        OBJECTIVE:    09/01/21 COGNITION:            Overall cognitive status: Within functional limits for tasks assessed                          SENSATION:            Light touch: Appears intact            Proprioception: Appears intact   GAIT:   Comments: WNL   POSTURE:  WNL   PELVIC MMT:  Strength 1/5, no endurance         PALPATION:   General  no abdominal tenderness; restriction throughout bil lower abdomen                 External Perineal Exam  vulvar/clitoral hood changes consistent with decreased estrogen                             Internal Pelvic Floor perineal scar tissue restriction/tenderness, restricted urethral mobility Rt, tenderness throughout superficial/deep pelvic floor    TONE: Pelvic floor atrophy   PROLAPSE: Grade 2 anterior vaginal wall laxity   TODAY'S TREATMENT   12/25/2021:  NMRE: all exercises cued for breathing and pelvic floor contraction coordination for improved pressure management for prolapse and decreased leakage 2x10 bridges and ball squeezes Opp hand/knee ball press 2x10 Bear plank x10 Bird dog 2x10 2x10 squats 5# and 7# Lunges x10 each 5# bicep curl into OHP x10 each NuStep L8 for 8 mins for improved reciprocal mobility in seated position for decrease strain at prolapse but improved pelvic strength    PATIENT EDUCATION:  Education details:  Exercise progressions.  Person educated: Patient Education method: Explanation, Demonstration, Tactile cues, Verbal cues, and Handouts Education comprehension: verbalized understanding     HOME EXERCISE PROGRAM: [id number]   ASSESSMENT:   CLINICAL IMPRESSION: Pt reports she does continue to see less leakage but reports prolapse symptoms and dryness about the same. Session focused on coordination of pelvic floor and breathing with all strengthening tasks for improved pressure management and decreased strain at pelvic floor. All exercises cued for coordination and emphasis on pressure management and pelvic floor activation during all exercises. Pt tolerated well and needed cues intermittently for technique. She will benefit from skilled PT intervention in order to improve vulvar/vaginal comfort, decrease symptoms of prolapse, improve core/pelvic floor strength, and create HEP that prevents symptoms of prolapse from progressing.      OBJECTIVE IMPAIRMENTS decreased activity tolerance, decreased coordination, decreased endurance, decreased mobility,  decreased ROM, decreased strength, increased fascial restrictions, increased muscle spasms, and pain.    ACTIVITY LIMITATIONS  hygiene, toileting   PARTICIPATION LIMITATIONS: interpersonal relationship, community activity, and occupation   PERSONAL FACTORS 3+ comorbidities: Breast cancer, vaginal hysterectomy, appendectomy, bowel rupture repair, lichens simplex  are also affecting patient's functional outcome.    REHAB POTENTIAL: Good   CLINICAL DECISION MAKING: Stable/uncomplicated   EVALUATION COMPLEXITY: Low     GOALS: Goals reviewed with patient? Yes   SHORT TERM GOALS: Target date: 09/29/2021 - updated 10/13/21   Pt will be independent with HEP.    Baseline: Goal status: MET 10/06/21   2.  Pt will be able to correctly perform diaphragmatic breathing and appropriate pressure management in order to prevent worsening vaginal wall laxity and improve pelvic floor A/ROM.      Baseline:  Goal status: MET 10/13/21       LONG TERM GOALS: Target date: 11/24/2021 - updated 10/13/21   Pt will be independent with advanced HEP.    Baseline:  Goal status: IN PROGRESS   2.  Pt will demonstrate normal pelvic floor muscle tone and A/ROM, able to achieve 3/5 strength with contractions and 10 sec endurance, in order to provide appropriate lumbopelvic support in functional activities.    Baseline: patient has contractions where strength has improved to 2/5, but cuing required Goal status: IN PROGRESS   3.  Patient will be independent with vulvovaginal massage and report improved vaginal dryness.  Baseline:  Goal status: MET 10/06/21   4.  Pt will report 0/10 pain with vaginal penetration in order to improve intimate relationship with partner.     Baseline: Pt had intercourse for the first time since treatment, reports some pain - did not use lubricant Goal status: IN PROGRESS   5.  Pt will report no leaks with laughing, coughing, sneezing in order to improve comfort with interpersonal  relationships and community activities.    Baseline:  Goal status: IN PROGRESS   6. Pt will be able to go 2-3 hours in between voids without urgency or incontinence in order to improve QOL and perform all functional activities with less difficulty.    Baseline:  Goal status: IN PROGRESS     PLAN: PT FREQUENCY: 1x/week   PT DURATION: 12 weeks   PLANNED INTERVENTIONS: Therapeutic exercises, Therapeutic activity, Neuromuscular re-education, Balance training, Gait training, Patient/Family education, Joint mobilization, Dry Needling, Biofeedback, and Manual therapy   PLAN FOR NEXT SESSION: Progress strengthening/mobility exercises.   Stacy Gardner, PT, DPT 12/25/2308:11 AM

## 2021-12-25 NOTE — Progress Notes (Signed)
[facility] Urodynamics Procedure  Referring Physician: Caryl Bis, MD Date of Procedure: 12/09/2021  Sandy Goodwin is a 51 y.o. female who presents for urodynamic evaluation. Indication(s) for study: mixed incontinence  Vital Signs: BP 115/77   Pulse 74   Ht '5\' 6"'$  (1.676 m)   Wt 189 lb (85.7 kg)   BMI 30.51 kg/m   Laboratory Results: A catheterized urine specimen revealed:  POC urine: negative  Voiding Diary: Not performed  Procedure Timeout:  The correct patient was verified and the correct procedure was verified. The patient was in the correct position and safety precautions were reviewed based on at the patient's history.  Urodynamic Procedure A 50F dual lumen urodynamics catheter was placed under sterile conditions into the patient's bladder. A 50F catheter was placed into the rectum in order to measure abdominal pressure. EMG patches were placed in the appropriate position.  All connections were confirmed and calibrations/adjusted made. Saline was instilled into the bladder through the dual lumen catheters.  Cough/valsalva pressures were measured periodically during filling.  Patient was allowed to void.  The bladder was then emptied of its residual.  UROFLOW: Revealed a Qmax of 25 mL/sec.  She voided 167 mL and had a residual of 15 mL.  It was a normal pattern and represented normal habits.  CMG: This was performed with sterile water in the sitting position at a fill rate of 20- 30 mL/min.    First sensation of fullness was 64 mLs,  First urge was 121 mLs,  Strong urge was 156 mLs and  Capacity was 321 mLs  Stress incontinence was not demonstrated: Testing was performed in both sitting and standing positions Highest negative Barrier CLPP was 83 cmH20 at 278 ml. Highest negative Barrier VLPP was 44 cmH20 at 156 ml.  Detrusor function was overactive, with one phasic contraction seen at capacity.  It occurred at 277 mL to 5.4 cm of water and was not  associated with leakage.  Compliance:  normal. End fill detrusor pressure was 5.4cmH20.  Calculated compliance was 91m/cmH20  UPP: MUCP with barrier reduction was 90.2 cm of water.    MICTURITION STUDY: Voiding was performed with reduction using scopettes in the sitting position.  Pdet at Qmax was 30.5 cm of water.  Qmax was 13.7 mL/sec.  It was a normal pattern.  She voided 319.6 mL and had a residual of 2 mL.  It was a volitional void, sustained detrusor contraction was present and abdominal straining was not present  EMG: This was performed with patches.  She had voluntary contractions, recruitment with fill was not present and urethral sphincter was relaxed with void.  The details of the procedure with the study tracings have been scanned into EPIC.   Urodynamic Impression:  1. Sensation was increased; capacity was normal 2. Stress Incontinence was not demonstrated. 3. Detrusor Overactivity was demonstrated without leakage. 4. Emptying was normal with a normal PVR, a sustained detrusor contraction present,  abdominal straining not present, normal urethral sphincter activity on EMG.  Plan: - The patient will follow up  to discuss the findings and treatment options.

## 2021-12-26 ENCOUNTER — Ambulatory Visit
Admission: RE | Admit: 2021-12-26 | Discharge: 2021-12-26 | Disposition: A | Discharge status: Home / Self Care | Payer: BC Managed Care – PPO | Source: Ambulatory Visit | Attending: Hematology and Oncology | Admitting: Hematology and Oncology

## 2021-12-26 DIAGNOSIS — Z170 Estrogen receptor positive status [ER+]: Secondary | ICD-10-CM

## 2021-12-26 MED ORDER — GADOBUTROL 1 MMOL/ML IV SOLN
9.0000 mL | Freq: Once | INTRAVENOUS | Status: AC | PRN
Start: 2021-12-26 — End: 2021-12-26
  Administered 2021-12-26: 9 mL via INTRAVENOUS

## 2022-01-08 ENCOUNTER — Ambulatory Visit: Payer: BC Managed Care – PPO | Admitting: Physical Therapy

## 2022-01-08 ENCOUNTER — Ambulatory Visit: Payer: BC Managed Care – PPO | Admitting: Rehabilitation

## 2022-01-08 DIAGNOSIS — M6281 Muscle weakness (generalized): Secondary | ICD-10-CM | POA: Diagnosis not present

## 2022-01-08 DIAGNOSIS — R279 Unspecified lack of coordination: Secondary | ICD-10-CM

## 2022-01-08 DIAGNOSIS — Z483 Aftercare following surgery for neoplasm: Secondary | ICD-10-CM

## 2022-01-08 NOTE — Progress Notes (Signed)
Patient Care Team: Caryl Bis, MD as PCP - General (Family Medicine) Rolm Bookbinder, MD as Consulting Physician (General Surgery) Nicholas Lose, MD as Consulting Physician (Hematology and Oncology) Kyung Rudd, MD as Consulting Physician (Radiation Oncology)  DIAGNOSIS:  Encounter Diagnoses  Name Primary?   Malignant neoplasm of upper-outer quadrant of right breast in female, estrogen receptor positive ([place])    Post-menopausal Yes    SUMMARY OF ONCOLOGIC HISTORY: Oncology History  Malignant neoplasm of upper-outer quadrant of right breast in female, estrogen receptor positive ([place])  03/23/2018 Genetic Testing   [facility] Genetic Testing Results: Negative, with a variant of uncertain significance in POLE.  Genes Analyzed: APC, ATM, AXIN2, BARD1, BMPR1A, BRCA1, BRCA2, BRIP1, CDH1, CDK4, CDKN2A, CHEK2, EPCAM (large rearrangement only), HOXB13 (sequencing only), GALNT12, MLH1, MSH2, MSH3, MSH6, MUTYH, NBN, NTHL1, PALB2, PMS2, PTEN, RAD51C, RAD51D, RNF43, RPS20, SMAD4, STK11, TP52. Sequencing was performed for select regions of POLE and POLD1, and large rearrangement analysis was performed for select regions of GERM1.   07/08/2020 Initial Diagnosis   Screening mammogram showed right breast calcifications. Diagnostic mammogram showed right breast calcifications spanning 4.1cm and no abnormal right axillary lymph nodes. 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   07/24/2020 - 11/15/2020 Neo-Adjuvant Chemotherapy   Taxotere, Carbo, Herceptin, Perjeta given every three weeks x 6   12/05/2020 - 07/24/2021 Chemotherapy   Maintenance Herceptin/Perjeta every three weeks for one year       12/11/2020 Surgery   Right breast lumpectomy [name]): Scattered foci of high-grade DCIS with calcifications, no invasive cancer, pathologic complete response, margins negative, 0/3 lymph nodes negative, previously ER 80%, PR  40%, HER2 positive, Ki-67 25%   12/11/2020 Cancer Staging   Staging form: Breast, AJCC 8th Edition - Pathologic stage from 12/11/2020: No Stage Recommended (ypT0, pN0, cM0) - Signed by Gardenia Phlegm, NP on 03/19/2021 Stage prefix: Post-therapy   12/20/2020 Surgery   Reconstruction with Dr. Iran Planas   02/04/2021 - 03/25/2021 Radiation Therapy   Adjuvant radiation   06/2021 -  Anti-estrogen oral therapy   Anastrozole daily     CHIEF COMPLIANT: Follow-up breast cancer   INTERVAL HISTORY: Sandy Goodwin is a 51 y.o. with above-mentioned. She presents to the clinic for a follow-up. She reports that the rash is better.     ALLERGIES:  is allergic to codeine, hyaluronic acid [sodium hyaluronate], and hydrocodone.  MEDICATIONS:  Current Outpatient Medications  Medication Sig Dispense Refill   tamoxifen (NOLVADEX) 20 MG tablet Take 1 tablet (20 mg total) by mouth daily. 90 tablet 3   BYSTOLIC 5 MG tablet Take 1 tablet by mouth daily.  1   cetirizine (ZYRTEC) 10 MG tablet Take 10 mg by mouth daily.     clobetasol ointment (TEMOVATE) 0.05 % Apply topically 2 (two) times daily.     estradiol (ESTRACE) 0.1 MG/GM vaginal cream Place 0.5g nightly for two weeks then twice a week after 30 g 11   levothyroxine (SYNTHROID, LEVOTHROID) 50 MCG tablet Take 50 mcg by mouth daily before breakfast.     metroNIDAZOLE (METROCREAM) 0.75 % cream Apply 1 application  topically 2 (two) times daily.     pantoprazole (PROTONIX) 40 MG tablet Take 40 mg by mouth daily.     triamcinolone (NASACORT) 55 MCG/ACT AERO nasal inhaler Place 2 sprays into the nose daily. Pt uses during allergy season     WEGOVY 2.4 MG/0.75ML SOAJ Inject into the skin.  No current facility-administered medications for this visit.    PHYSICAL EXAMINATION: ECOG PERFORMANCE STATUS: 1 - Symptomatic but completely ambulatory  Vitals:   01/09/22 1050  BP: 137/87  Pulse: 66  Resp: 14  Temp: (!) 97.5 F (36.4 C)  SpO2: 99%    Filed Weights   01/09/22 1050  Weight: 189 lb 1.6 oz (85.8 kg)     LABORATORY DATA:  I have reviewed the data as listed    Latest Ref Rng & Units 07/24/2021    9:09 AM 07/03/2021    9:01 AM 06/12/2021    9:01 AM  CMP  Glucose 70 - 99 mg/dL 107  106  102   BUN 6 - 20 mg/dL $Remove'24  17  19   'GXoMmco$ Creatinine 0.44 - 1.00 mg/dL 0.87  0.70  0.77   Sodium 135 - 145 mmol/L 141  140  140   Potassium 3.5 - 5.1 mmol/L 4.4  4.1  4.0   Chloride 98 - 111 mmol/L 109  106  106   CO2 22 - 32 mmol/L $RemoveB'26  29  29   'WCUWYRio$ Calcium 8.9 - 10.3 mg/dL 8.5  8.9  9.0   Total Protein 6.5 - 8.1 g/dL 6.6  7.0  7.2   Total Bilirubin 0.3 - 1.2 mg/dL 0.2  0.3  0.4   Alkaline Phos 38 - 126 U/L 107  111  115   AST 15 - 41 U/L $Remo'19  17  18   'NgWTk$ ALT 0 - 44 U/L $Remo'16  17  19     'yYanl$ Lab Results  Component Value Date   WBC 6.9 07/24/2021   HGB 10.5 (L) 07/24/2021   HCT 33.1 (L) 07/24/2021   MCV 85.3 07/24/2021   PLT 226 07/24/2021   NEUTROABS 4.7 07/24/2021    ASSESSMENT & PLAN:  Malignant neoplasm of upper-outer quadrant of right breast in female, estrogen receptor positive ([place]) 07/08/2020: Screening mammogram showed right breast calcifications. Diagnostic mammogram showed right breast calcifications spanning 4.1cm and no abnormal right axillary lymph nodes. 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 Genetics done in 2019: Negative   Treatment plan: 1. Neoadjuvant chemotherapy with TCH Perjeta 6 cycles completed 11/15/2020 followed by Herceptin Perjeta maintenance for 1 year 2. 12/11/2020:Right breast lumpectomy [name]): Scattered foci of high-grade DCIS with calcifications, no invasive cancer, pathologic complete response, margins negative, 0/3 lymph nodes negative, previously ER 80%, PR 40%, HER2 positive, Ki-67 25% 3. Followed by adjuvant radiation therapy  started 02/04/2021 4.  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 URCC nausea study --------------------------------------------------------------------------------------------------------------------------------------- Profound intermittent rash with skin exfoliation on the face and puffiness of the face started March 2023 switched to exemestane and the rash came back.  Discontinued exemestane August 2023.  Recommendation:  Tamoxifen to start after her rectocele surgery on 01/22/2022.   Breast Cancer Surveillance: 1. Breast MRI: 12/26/21: Benign 2. DEXA scan.12/18/21: T score -2.2  3. Mammograms:07/10/21: Benign  Telephone visit in 2 months to discuss tolerance to tamoxifen.  She will start 10 mg a day and then increase it to 20 mg if she tolerates it well.    Orders Placed This Encounter  Procedures   DG Bone Density    Standing Status:   Future    Standing Expiration Date:   01/09/2023    Order Specific Question:   Reason for Exam (SYMPTOM  OR DIAGNOSIS REQUIRED)    Answer:   post menopausal    Order Specific Question:   Is the patient pregnant?    Answer:   No    Order Specific Question:   Preferred imaging location?    Answer:   [facility]   MR BREAST BILATERAL [facility] [place] [facility]    Standing Status:   Future    Standing Expiration Date:   01/10/2023    Order Specific Question:   If indicated for the ordered procedure, I authorize the administration of contrast media per Radiology protocol    Answer:   Yes    Order Specific Question:   What is the patient's sedation requirement?    Answer:   No Sedation    Order Specific Question:   Does the patient have a pacemaker or implanted devices?    Answer:   No    Order Specific Question:   Preferred imaging location?    Answer:   [facility] (table limit-550lbs)   The patient has a good understanding of the overall plan. she agrees with it. she will call with any problems that may develop before the next visit here. Total time spent:  30 mins including face to face time and time spent for planning, charting and co-ordination of care   Harriette Ohara, MD 01/09/22    I Gardiner Coins am scribing for Dr. Lindi Adie  I have reviewed the above documentation for accuracy and completeness, and I agree with the above.

## 2022-01-08 NOTE — Therapy (Signed)
OUTPATIENT PHYSICAL THERAPY TREATMENT NOTE   Patient Name: Sandy Goodwin MRN: [id number] DOB:October 20, 1970, 51 y.o., female Today's Date: 01/08/2022  PCP: Caryl Bis, MD REFERRING PROVIDER: Gardenia Phlegm, NP  END OF SESSION:   PT End of Session - 01/08/22 1145     Visit Number 17   total including CA visits and PF 9   Date for PT Re-Evaluation 02/20/22    Authorization Type [employer]    PT Start Time 1100    PT Stop Time 1138    PT Time Calculation (min) 38 min    Activity Tolerance Patient tolerated treatment well    Behavior During Therapy WFL for tasks assessed/performed                  Past Medical History:  Diagnosis Date   Arthritis    Breast cancer ([place])    Complication of anesthesia    Itching   Dysrhythmia    Tachycardia   Hypothyroidism    Past Surgical History:  Procedure Laterality Date   ABDOMINAL HYSTERECTOMY     APPENDECTOMY  2008   Bowel Rupture Repair  2008   Ruptured during appendectomy   Breast Biopsy Right 1999   BREAST LUMPECTOMY WITH RADIOACTIVE SEED AND SENTINEL LYMPH NODE BIOPSY Right 12/11/2020   Procedure: RIGHT BREAST LUMPECTOMY WITH RADIOACTIVE SEED X2 AND RIGHT AXILLARY SENTINEL LYMPH NODE BIOPSY;  Surgeon: Rolm Bookbinder, MD;  Location: [place];  Service: General;  Laterality: Right;   BREAST RECONSTRUCTION Right 12/20/2020   Procedure: RIGHT ONCOPLASTIC RECONSTRUCTION;  Surgeon: Irene Limbo, MD;  Location: [place];  Service: Plastics;  Laterality: Right;   BREAST REDUCTION SURGERY Left 12/20/2020   Procedure: LEFT BREAST REDUCTION;  Surgeon: Irene Limbo, MD;  Location: [place];  Service: Plastics;  Laterality: Left;   COLONOSCOPY N/A 06/15/2016   Procedure: COLONOSCOPY;  Surgeon: Rogene Houston, MD;  [facility];  Service: Endoscopy;  Laterality: N/A;  845   IBF  [id number]   INCISION AND DRAINAGE OF WOUND Right 01/10/2021   Procedure: INCISION AND  DRAINAGE OF RIGHT AXILLA;  Surgeon: Irene Limbo, MD;  Location: [place];  Service: Plastics;  Laterality: Right;   PORTACATH PLACEMENT Left 07/23/2020   Procedure: INSERTION PORT-A-CATH;  Surgeon: Rolm Bookbinder, MD;  Location: [place];  Service: General;  Laterality: Left;   THYROIDECTOMY, PARTIAL Bilateral 2012   Patient Active Problem List   Diagnosis Date Noted   Uterine prolapse 03/20/2021   Polyarthralgia 03/20/2021   Port-A-Cath in place 02/06/2021   Genetic testing 07/17/2020   Malignant neoplasm of upper-outer quadrant of right breast in female, estrogen receptor positive ([place]) 07/12/2020   Change in bowel habits 06/12/2016   Mixed hyperlipidemia 01/07/2016   Hypothyroidism, unspecified 01/07/2016   Allergic rhinitis 04/02/2015   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    REFERRING DIAG: C50.411,Z17.0 (ICD-10-CM) - Malignant neoplasm of upper-outer quadrant of right breast in female, estrogen receptor positive ([place])  THERAPY DIAG:  Muscle weakness (generalized)  Unspecified lack of coordination  Rationale for Evaluation and Treatment Rehabilitation  PERTINENT HISTORY: Breast cancer, abdominal hysterectomy, appendectomy, bowel rupture repair, lichens simplex  PRECAUTIONS: NA  SUBJECTIVE: "I feel 95% better since starting PT".   PAIN:  Are you having pain? No   SUBJECTIVE 09/01/21:  Fluid intake: Yes: a lot of water     Patient confirms identification and approves PT to assess pelvic floor and treatment Yes     PAIN:  Are you having pain? No     PRECAUTIONS: None   WEIGHT BEARING RESTRICTIONS No   FALLS:  Has patient fallen in last 6 months? 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   PLOF: Independent   PATIENT GOALS improving vaginal dryness/incontinence  PERTINENT HISTORY:  Breast cancer,  abdominal hysterectomy, appendectomy, bowel rupture repair, lichens simplex Sexual abuse: No   BOWEL MOVEMENT Pain with bowel movement: No Type of bowel movement:Frequency 1x/day with Miralax and Strain Yes Takes Miralax most days  Fully empty rectum: Yes: - Leakage: Yes: occasional, unsure of what causes it, started after posterior grade 3 year Pads: No Fiber supplement: No   URINATION Pain with urination: No Fully empty bladder: No Stream:  WNL Urgency: Yes: no time to several minutes Frequency: every couple of hours - when at work much less Leakage: Urge to void, Walking to the bathroom, Coughing, Sneezing, and Laughing - also post-void dribble Pads: No - tries not too due to skin irritation   INTERCOURSE Pain with intercourse:  neuroma for a year after first delivery - no pain since until cancer treatment. Not currently attempting Ability to have vaginal penetration:  No Climax: Not attempted since cancer treatment     PREGNANCY Vaginal deliveries 2 Tearing Yes: Grade 3 and an anterior tear with second delivery C-section deliveries 0 Currently pregnant No   PROLAPSE No longer wearing pessary - reports they do not stay in place and not helpful.        OBJECTIVE:    09/01/21 COGNITION:            Overall cognitive status: Within functional limits for tasks assessed                          SENSATION:            Light touch: Appears intact            Proprioception: Appears intact   GAIT:   Comments: WNL   POSTURE:  WNL   PELVIC MMT:  Strength 1/5, no endurance 01/08/2022: 3/5, 6s, and 10 reps          PALPATION:   General  no abdominal tenderness; restriction throughout bil lower abdomen                 External Perineal Exam vulvar/clitoral hood changes consistent with decreased estrogen  01/08/2022:  improved, less redness, and not painful                             Internal Pelvic Floor perineal scar tissue restriction/tenderness, restricted urethral  mobility Rt, tenderness throughout superficial/deep pelvic floor     TONE: Pelvic floor atrophy   PROLAPSE: Grade 2 anterior vaginal wall laxity   TODAY'S TREATMENT   01/08/2022:  Therapeutic exercise:  pt consented to internal vaginal reassessment today: 3L45 quick flicks 6Y56 pelvic floor contractions 10 5-7s isometrics NMRE: Sit to stand from mat table 10# with pelvic floor contraction into standing 2x10 Seated alt marching with 5# dumbbell held at shoulder height with bil elbow ext bil 2x10 each leg     PATIENT EDUCATION:  Education details: Exercise  progressions.  Person educated: Patient Education method: Explanation, Demonstration, Tactile cues, Verbal cues, and Handouts Education comprehension: verbalized understanding     HOME EXERCISE PROGRAM: [id number]   ASSESSMENT:   CLINICAL IMPRESSION: Pt report she feels 95% better since starting PT and very pleased with progress, does still have discomfort due to dryness with intercourse, and small/minimal leakage intermittently. Pt reports this is much better overall but still present. Pt session focused on internal vaginal reassessment with pt demonstrating improvement with strength, endurance, and coordination since evaluation now 3/5 strength consistently and good coordination of pelvic floor with breathing mechanics. Pt has met 4/6 LTG and 2/2 STG. This will serve as pt's DC from therapy, she rpeorts good understanding of HEP and how to continue pelvic floor activation and coordination with breathing at home and with functional tasks. Does has surgical repair scheduled 10/16 for POSTERIOR REPAIR WITH SACROSPINOUS FIXATION, ANAL SPHINCTEROPLASTY, PERINEORRHAPY per chart review with Dr. Wannetta Sender. Pt reports she would like to stop therapy until after this and potentially return if needed after clearance for therapy post surgery.    OBJECTIVE IMPAIRMENTS decreased activity tolerance, decreased coordination, decreased endurance,  decreased mobility, decreased ROM, decreased strength, increased fascial restrictions, increased muscle spasms, and pain.    ACTIVITY LIMITATIONS  hygiene, toileting   PARTICIPATION LIMITATIONS: interpersonal relationship, community activity, and occupation   PERSONAL FACTORS 3+ comorbidities: Breast cancer, vaginal hysterectomy, appendectomy, bowel rupture repair, lichens simplex  are also affecting patient's functional outcome.    REHAB POTENTIAL: Good   CLINICAL DECISION MAKING: Stable/uncomplicated   EVALUATION COMPLEXITY: Low     GOALS: Goals reviewed with patient? Yes   SHORT TERM GOALS: Target date: 09/29/2021 - updated 10/13/21   Pt will be independent with HEP.    Baseline: Goal status: MET 10/06/21   2.  Pt will be able to correctly perform diaphragmatic breathing and appropriate pressure management in order to prevent worsening vaginal wall laxity and improve pelvic floor A/ROM.      Baseline:  Goal status: MET 10/13/21       LONG TERM GOALS: Target date: 11/24/2021 - updated 10/13/21   Pt will be independent with advanced HEP.    Baseline:  Goal status: MET 01/08/2022    2.  Pt will demonstrate normal pelvic floor muscle tone and A/ROM, able to achieve 3/5 strength with contractions and 10 sec endurance, in order to provide appropriate lumbopelvic support in functional activities.    Baseline: patient has contractions where strength has improved to 2/5, but cuing required Goal status: 01/08/2022 MET 3/5   3.  Patient will be independent with vulvovaginal massage and report improved vaginal dryness.  Baseline:  Goal status: MET 10/06/21   4.  Pt will report 0/10 pain with vaginal penetration in order to improve intimate relationship with partner.     Baseline: Pt had intercourse for the first time since treatment, reports some pain - did not use lubricant Goal status: "bothersome" - 9/28 not met   5.  Pt will report no leaks with laughing, coughing, sneezing in  order to improve comfort with interpersonal relationships and community activities.    Baseline:  Goal status: not met - still has small leakage instances intermittently "but much better overall" per pt   6. Pt will be able to go 2-3 hours in between voids without urgency or incontinence in order to improve QOL and perform all functional activities with less difficulty.    Baseline:  Goal status: MET 01/08/2022  PLAN: PT FREQUENCY: 1x/week   PT DURATION: 12 weeks   PLANNED INTERVENTIONS: Therapeutic exercises, Therapeutic activity, Neuromuscular re-education, Balance training, Gait training, Patient/Family education, Joint mobilization, Dry Needling, Biofeedback, and Manual therapy   PLAN FOR NEXT SESSION:  PHYSICAL THERAPY DISCHARGE SUMMARY  Visits from Start of Care: 9 for pelvic floor    Current functional level related to goals / functional outcomes: 2/2 STG met, 4/6 LTG met   Remaining deficits: Mild leakage intermittently and discomfort with intercourse due to dryness    Education / Equipment: HEP   Patient agrees to discharge. Patient goals were partially met. Patient is being discharged due to being pleased with the current functional level.  Stacy Gardner, PT, DPT 01/08/2310:46 AM

## 2022-01-08 NOTE — Therapy (Signed)
OUTPATIENT PHYSICAL THERAPY SOZO SCREENING NOTE   Patient Name: Sandy Goodwin MRN: [id number] DOB:[birth year], 51 y.o., female Today's Date: 01/08/2022  PCP: Caryl Bis, MD REFERRING PROVIDER: Wilber Bihari [place] of Session - 01/08/22 1204     Visit Number 17    Number of Visits 10    PT Start Time 6295    PT Stop Time 1203    PT Time Calculation (min) 8 min    Activity Tolerance Patient tolerated treatment well    Behavior During Therapy [facility] for tasks assessed/performed             Past Medical History:  Diagnosis Date   Arthritis    Breast cancer ([place])    Complication of anesthesia    Itching   Dysrhythmia    Tachycardia   Hypothyroidism    Past Surgical History:  Procedure Laterality Date   ABDOMINAL HYSTERECTOMY     APPENDECTOMY  2008   Bowel Rupture Repair  2008   Ruptured during appendectomy   Breast Biopsy Right 1999   BREAST LUMPECTOMY WITH RADIOACTIVE SEED AND SENTINEL LYMPH NODE BIOPSY Right 12/11/2020   Procedure: RIGHT BREAST LUMPECTOMY WITH RADIOACTIVE SEED X2 AND RIGHT AXILLARY SENTINEL LYMPH NODE BIOPSY;  Surgeon: Rolm Bookbinder, MD;  Location: [place];  Service: General;  Laterality: Right;   BREAST RECONSTRUCTION Right 12/20/2020   Procedure: RIGHT ONCOPLASTIC RECONSTRUCTION;  Surgeon: Irene Limbo, MD;  Location: [place];  Service: Plastics;  Laterality: Right;   BREAST REDUCTION SURGERY Left 12/20/2020   Procedure: LEFT BREAST REDUCTION;  Surgeon: Irene Limbo, MD;  Location: [place];  Service: Plastics;  Laterality: Left;   COLONOSCOPY N/A 06/15/2016   Procedure: COLONOSCOPY;  Surgeon: Rogene Houston, MD;  [facility];  Service: Endoscopy;  Laterality: N/A;  845   IBF  [id number]   INCISION AND DRAINAGE OF WOUND Right 01/10/2021   Procedure: INCISION AND DRAINAGE OF RIGHT AXILLA;  Surgeon: Irene Limbo, MD;  Location: [place];  Service:  Plastics;  Laterality: Right;   PORTACATH PLACEMENT Left 07/23/2020   Procedure: INSERTION PORT-A-CATH;  Surgeon: Rolm Bookbinder, MD;  Location: [place];  Service: General;  Laterality: Left;   THYROIDECTOMY, PARTIAL Bilateral 2012   Patient Active Problem List   Diagnosis Date Noted   Uterine prolapse 03/20/2021   Polyarthralgia 03/20/2021   Port-A-Cath in place 02/06/2021   Genetic testing 07/17/2020   Malignant neoplasm of upper-outer quadrant of right breast in female, estrogen receptor positive ([place]) 07/12/2020   Change in bowel habits 06/12/2016   Mixed hyperlipidemia 01/07/2016   Hypothyroidism, unspecified 01/07/2016   Allergic rhinitis 04/02/2015   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 No  SOZO SCREENING: Patient was assessed today using the SOZO machine to determine the lymphedema index score. This was compared to her baseline score. It was determined that she is within the recommended range when compared to her baseline and no further action is needed at this time. She will continue SOZO screenings. These are done every 3 months for 2 years post operatively followed by every 6 months for 2 years, and then annually.  Mourad Cwikla, Student-PT 01/08/2022, 12:05 PM

## 2022-01-09 ENCOUNTER — Inpatient Hospital Stay: Payer: BC Managed Care – PPO

## 2022-01-09 ENCOUNTER — Inpatient Hospital Stay: Payer: BC Managed Care – PPO | Attending: Hematology and Oncology | Admitting: Hematology and Oncology

## 2022-01-09 ENCOUNTER — Other Ambulatory Visit: Payer: Self-pay

## 2022-01-09 VITALS — BP 137/87 | HR 66 | Temp 97.5°F | Resp 14 | Ht 66.0 in | Wt 189.1 lb

## 2022-01-09 DIAGNOSIS — Z780 Asymptomatic menopausal state: Secondary | ICD-10-CM | POA: Diagnosis not present

## 2022-01-09 DIAGNOSIS — Z79811 Long term (current) use of aromatase inhibitors: Secondary | ICD-10-CM | POA: Insufficient documentation

## 2022-01-09 DIAGNOSIS — C50411 Malignant neoplasm of upper-outer quadrant of right female breast: Secondary | ICD-10-CM | POA: Diagnosis present

## 2022-01-09 DIAGNOSIS — Z170 Estrogen receptor positive status [ER+]: Secondary | ICD-10-CM

## 2022-01-09 MED ORDER — TAMOXIFEN CITRATE 20 MG PO TABS: 20.0000 mg | ORAL_TABLET | Freq: Every day | ORAL | 3 refills | Status: DC

## 2022-01-09 NOTE — Assessment & Plan Note (Addendum)
07/08/2020:Screening mammogram showed right breast calcifications. Diagnostic mammogram showed right breast calcifications spanning 4.1cm and no abnormal right axillary lymph nodes. 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 Genetics done in 2019: Negative  Treatment plan: 1. Neoadjuvant chemotherapy with [place] Perjeta 6 cyclescompleted 8/5/2022followed by Herceptin Perjeta maintenance for 1 year 2.12/11/2020:Right breast lumpectomy [name]): Scattered foci of high-grade DCIS with calcifications, no invasive cancer, pathologic complete response, margins negative, 0/3 lymph nodes negative, previously ER 80%, PR 40%, HER2 positive, Ki-67 25% 3. Followed by adjuvant radiation therapystarted 02/04/2021 4.Followed by adjuvant antiestrogen therapy [place]. URCCnausea study --------------------------------------------------------------------------------------------------------------------------------------- Profound intermittent rash with skin exfoliation on the face and puffiness of the face started March 2023switched to exemestane and the rash came back.  Discontinued exemestane August 2023.  Recommendation:  Tamoxifen to start after her rectocele surgery on 01/22/2022.  Breast Cancer Surveillance: 1. Breast MRI: 12/26/21: Benign 2. DEXA scan.12/18/21: T score -2.2  3. Mammograms:07/10/21: Benign  Telephone visit in 2 months to discuss tolerance to tamoxifen.  She will start 10 mg a day and then increase it to 20 mg if she tolerates it well.

## 2022-01-13 ENCOUNTER — Encounter: Payer: Self-pay | Admitting: Hematology and Oncology

## 2022-01-15 ENCOUNTER — Encounter (HOSPITAL_BASED_OUTPATIENT_CLINIC_OR_DEPARTMENT_OTHER): Payer: Self-pay | Admitting: Obstetrics and Gynecology

## 2022-01-15 NOTE — Progress Notes (Signed)
Spoke w/ via phone for pre-op interview--- pt Lab needs dos----  no (per anes)/  pre-op orders pending             Lab results------ no COVID test -----patient states asymptomatic no test needed Arrive at ------- 4081 on 01-26-2022 NPO after MN NO Solid Food.  Clear liquids from MN until--  0915 Med rec completed Medications to take morning of surgery ----- none Diabetic medication ----- n/a Patient instructed no nail polish to be worn day of surgery Patient instructed to bring photo id and insurance card day of surgery Patient aware to have Driver (ride ) / caregiver  for 24 hours after surgery -- husband, scott Patient Special Instructions ----- n/a Pre-Op special Istructions ----- sent inbox message to dr schroeder in epic, requested orders Patient verbalized understanding of instructions that were given at this phone interview. Patient denies shortness of breath, chest pain, fever, cough at this phone interview.

## 2022-01-19 ENCOUNTER — Encounter: Payer: Self-pay | Admitting: Obstetrics and Gynecology

## 2022-01-19 ENCOUNTER — Ambulatory Visit (INDEPENDENT_AMBULATORY_CARE_PROVIDER_SITE_OTHER): Payer: BC Managed Care – PPO | Admitting: Obstetrics and Gynecology

## 2022-01-19 VITALS — BP 129/89 | HR 85

## 2022-01-19 DIAGNOSIS — Z01818 Encounter for other preprocedural examination: Secondary | ICD-10-CM

## 2022-01-19 MED ORDER — POLYETHYLENE GLYCOL 3350 17 GM/SCOOP PO POWD: 17.0000 g | Freq: Every day | ORAL | 0 refills | Status: DC

## 2022-01-19 MED ORDER — OXYCODONE HCL 5 MG PO TABS: 5.0000 mg | ORAL_TABLET | ORAL | 0 refills | Status: DC | PRN

## 2022-01-19 MED ORDER — IBUPROFEN 600 MG PO TABS: 600.0000 mg | ORAL_TABLET | Freq: Four times a day (QID) | ORAL | 0 refills | Status: DC | PRN

## 2022-01-19 MED ORDER — GABAPENTIN 100 MG PO CAPS: 100.0000 mg | ORAL_CAPSULE | Freq: Three times a day (TID) | ORAL | 0 refills | Status: DC

## 2022-01-19 MED ORDER — ACETAMINOPHEN 500 MG PO TABS: 500.0000 mg | ORAL_TABLET | Freq: Four times a day (QID) | ORAL | 0 refills | Status: AC | PRN

## 2022-01-19 NOTE — Progress Notes (Signed)
[facility] Pre-Operative visit  Subjective Chief Complaint: Sandy Goodwin presents for a preoperative encounter.   History of Present Illness: Sandy Goodwin is a 51 y.o. female who presents for preoperative visit.  She is scheduled to undergo Exam under anesthesia, posterior repair, perineorrhaphy, sacrospinous ligament fixation, anal sphincteroplasty on 01/26/22.  Her symptoms include vaginal bulge, and she was was found to have Stage I anterior, Stage II posterior, Stage I apical prolapse.  Urodynamic Impression:  1. Sensation was increased; capacity was normal 2. Stress Incontinence was not demonstrated. 3. Detrusor Overactivity was demonstrated without leakage. 4. Emptying was normal with a normal PVR, a sustained detrusor contraction present,  abdominal straining not present, normal urethral sphincter activity on EMG.  Past Medical History:  Diagnosis Date   Full incontinence-feces    History of anemia    History of cancer chemotherapy    right breast;   neo-adjuvent 07-24-2020  to 11-15-2020  and 12-05-2020  to 07-24-2021   History of external beam radiation therapy    right reast 02-04-2021  to 03-25-2021   History of gastric ulcer    remote hx -- resolved   Hypothyroidism, postsurgical    followed by pcp;     02/ 2012  s/p  right hemithyroidectomy  (follicular adenoma)   Malignant neoplasm of upper-outer quadrant of right breast in female, estrogen receptor positive ([place]) 06/2020   oncologist--- dr Lindi Adie;  DCIS high grade right breast , neo-adjuvant chemo completed 11-15-2020,  s/p right breast lumpectomy w/ node dissection on 12-11-2020, then chemo completed 07-24-2021 and radiation completed 03-25-2021   Polyarthralgia    PONV (postoperative nausea and vomiting)    Prolapse of vaginal vault after hysterectomy    and posterior prolapse   RA (rheumatoid arthritis) ([place])    rheumatologsit--- dr Amil Amen;  no treatment   Tachycardia    Tachycardia     Past  Surgical History:  Procedure Laterality Date   APPENDECTOMY  08/2006   exploratory laparatomy w/ appendectomy (ruptured) and repair bowel   BREAST LUMPECTOMY WITH RADIOACTIVE SEED AND SENTINEL LYMPH NODE BIOPSY Right 12/11/2020   Procedure: RIGHT BREAST LUMPECTOMY WITH RADIOACTIVE SEED X2 AND RIGHT AXILLARY SENTINEL LYMPH NODE BIOPSY;  Surgeon: Rolm Bookbinder, MD;  Location: [place];  Service: General;  Laterality: Right;   BREAST RECONSTRUCTION Right 12/20/2020   Procedure: RIGHT ONCOPLASTIC RECONSTRUCTION;  Surgeon: Irene Limbo, MD;  Location: [place];  Service: Plastics;  Laterality: Right;   BREAST REDUCTION SURGERY Left 12/20/2020   Procedure: LEFT BREAST REDUCTION;  Surgeon: Irene Limbo, MD;  Location: [place];  Service: Plastics;  Laterality: Left;   COLONOSCOPY N/A 06/15/2016   Procedure: COLONOSCOPY;  Surgeon: Rogene Houston, MD;  [facility];  Service: Endoscopy;  Laterality: N/A;  [place]  06/03/2011   w/ suction for miscarriage   EXCISION OF BREAST BIOPSY Right 1999   INCISION AND DRAINAGE OF WOUND Right 01/10/2021   Procedure: INCISION AND DRAINAGE OF RIGHT AXILLA;  Surgeon: Irene Limbo, MD;  Location: [place];  Service: Plastics;  Laterality: Right;   OVUM / OOCYTE RETRIEVAL     2010  and 2012   PORTACATH PLACEMENT Left 07/23/2020   Procedure: INSERTION PORT-A-CATH;  Surgeon: Rolm Bookbinder, MD;  Location: [place];  Service: General;  Laterality: Left;   THYROID LOBECTOMY Right 05/2010   [facility]   VAGINAL HYSTERECTOMY  03/21/2019   @  [facility] by dr Barrie Dunker;   w/ Gaspar Garbe culdoplasty    is allergic to codeine, hyaluronic acid [sodium hyaluronate], hydrocodone, and latex.   Family History  Problem Relation Age of Onset   Hypertension Mother    Breast cancer Mother    Lymphoma Mother    Hypertension Brother     Breast cancer Maternal Aunt    Lymphoma Maternal Grandfather    Breast cancer Maternal Aunt     Social History   Tobacco Use   Smoking status: Never   Smokeless tobacco: Never  Vaping Use   Vaping Use: Never used  Substance Use Topics   Alcohol use: Not Currently    Comment: rare   Drug use: Never     Review of Systems was negative for a full 10 system review except as noted in the History of Present Illness.   Current Outpatient Medications:    acetaminophen (TYLENOL) 500 MG tablet, Take 1 tablet (500 mg total) by mouth every 6 (six) hours as needed (pain)., Disp: 30 tablet, Rfl: 0   gabapentin (NEURONTIN) 100 MG capsule, Take 1 capsule (100 mg total) by mouth 3 (three) times daily., Disp: 90 capsule, Rfl: 0   ibuprofen (ADVIL) 600 MG tablet, Take 1 tablet (600 mg total) by mouth every 6 (six) hours as needed., Disp: 30 tablet, Rfl: 0   oxyCODONE (OXY IR/ROXICODONE) 5 MG immediate release tablet, Take 1 tablet (5 mg total) by mouth every 4 (four) hours as needed for severe pain., Disp: 5 tablet, Rfl: 0   polyethylene glycol powder (GLYCOLAX/MIRALAX) 17 GM/SCOOP powder, Take 17 g by mouth daily. Drink 17g (1 scoop) dissolved in water per day., Disp: 255 g, Rfl: 0   BYSTOLIC 5 MG tablet, Take 1 tablet by mouth at bedtime., Disp: , Rfl: 1   cetirizine (ZYRTEC) 10 MG tablet, Take 10 mg by mouth at bedtime., Disp: , Rfl:    clobetasol ointment (TEMOVATE) 7.32 %, Apply 1 Application topically 2 (two) times daily as needed., Disp: , Rfl:    estradiol (ESTRACE) 0.1 MG/GM vaginal cream, Place 0.5g nightly for two weeks then twice a week after (Patient taking differently: 1 Applicatorful 2 (two) times a week. Place 0.5g nightly for two weeks then twice a week after), Disp: 30 g, Rfl: 11   levothyroxine (SYNTHROID, LEVOTHROID) 50 MCG tablet, Take 50 mcg by mouth at bedtime., Disp: , Rfl:    metroNIDAZOLE (METROCREAM) 0.75 % cream, Apply 1 application  topically 2 (two) times daily as needed.,  Disp: , Rfl:    pantoprazole (PROTONIX) 40 MG tablet, Take 40 mg by mouth at bedtime., Disp: , Rfl:    Polyethylene Glycol 3350 (MIRALAX PO), Take by mouth daily as needed., Disp: , Rfl:    tamoxifen (NOLVADEX) 20 MG tablet, Take 1 tablet (20 mg total) by mouth daily. (Patient not taking: Reported on 01/15/2022), Disp: 90 tablet, Rfl: 3   triamcinolone (NASACORT) 55 MCG/ACT AERO nasal inhaler, Place 2 sprays into the nose daily as needed. Pt uses during allergy season, Disp: , Rfl:    WEGOVY 2.4 MG/0.75ML SOAJ, Inject 2.4 mg into the skin once a week. Friday's, Disp: , Rfl:    Objective Vitals:   01/19/22 1312  BP: 129/89  Pulse: 85    Gen: NAD CV: S1 S2 RRR Lungs: Clear to auscultation bilaterally Abd: soft, nontender   Previous Pelvic Exam showed: POP-Q:    POP-Q   -2  Aa   -2                                           Ba   -6                                              C    4.5                                            Gh   4                                            Pb   8.5                                            tvl    0                                            Ap   0                                            Bp                                                  D       Assessment/ Plan  Assessment: The patient is a 51 y.o. year old scheduled to undergo Exam under anesthesia, posterior repair, perineorrhaphy, sacrospinous ligament fixation, anal sphincteroplasty. Verbal consent was obtained for these procedures.  Plan: General Surgical Consent: The patient has previously been counseled on alternative treatments, and the decision by the patient and provider was to proceed with the procedure listed above.  For all procedures, there are risks of bleeding, infection, damage to surrounding organs including but not limited to bowel, bladder, blood vessels, ureters and nerves, and need for further surgery if an  injury were to occur. These risks are all low with minimally invasive surgery.   There are risks of numbness and weakness at any body site or buttock/rectal pain.  It is possible that baseline pain can be worsened by surgery, either with or without mesh. If surgery is vaginal, there is also a low risk of possible conversion to laparoscopy or open abdominal incision where indicated. Very rare risks include blood transfusion, blood clot, heart attack, pneumonia, or death.   There is also a risk of short-term postoperative urinary retention with need to use a catheter. About half of patients need to go home from surgery with a catheter, which is then  later removed in the office. The risk of long-term need for a catheter is very low. There is also a risk of worsening of overactive bladder.    Prolapse (with or without mesh): Risk factors for surgical failure  include things that put pressure on your pelvis and the surgical repair, including obesity, chronic cough, and heavy lifting or straining (including lifting children or adults, straining on the toilet, or lifting heavy objects such as furniture or anything weighing >25 lbs. Risks of recurrence is 20-30% with vaginal native tissue repair and a less than 10% with sacrocolpopexy with mesh.   .    We discussed consent for blood products. Risks for blood transfusion include allergic reactions, other reactions that can affect different body organs and managed accordingly, transmission of infectious diseases such as HIV or Hepatitis. However, the blood is screened. Patient consents for blood products.  Pre-operative instructions:  She was instructed to not take Aspirin/NSAIDs x 7days prior to surgery.  Antibiotic prophylaxis was ordered as indicated.  Catheter use: Patient will go home with foley if needed after post-operative voiding trial.  Post-operative instructions:  She was provided with specific post-operative instructions, including precautions and  signs/symptoms for which we would recommend contacting us, in addition to daytime and after-hours contact phone numbers. This was provided on a handout.   Post-operative medications: Prescriptions for motrin, tylenol, miralax, gabapentin and oxycodone were sent to her pharmacy. Discussed using ibuprofen and tylenol on a schedule to limit use of narcotics.  Advised her to use sitz bath once daily for perineal care and to use a peri bottle to clean instead of wiping.   Laboratory testing:  No labs needed  Preoperative clearance:  She does not require surgical clearance.    Post-operative follow-up:  A post-operative appointment will be made for 6 weeks from the date of surgery. If she needs a post-operative nurse visit for a voiding trial, that will be set up after she leaves the hospital.    Patient will call the clinic or use MyChart should anything change or any new issues arise.   [name], MD  Time spent: I spent 25 minutes dedicated to the care of this patient on the date of this encounter to include pre-visit review of records, face-to-face time with the patient and post visit documentation and ordering medication/ testing.

## 2022-01-21 NOTE — H&P (Signed)
[facility] Pre-Operative H&P  Subjective Chief Complaint: Sandy Goodwin presents for a preoperative encounter.   History of Present Illness: Sandy Goodwin is a 51 y.o. female who presents for preoperative visit.  She is scheduled to undergo Exam under anesthesia, posterior repair, perineorrhaphy, sacrospinous ligament fixation, anal sphincteroplasty on 01/26/22.  Her symptoms include vaginal bulge, and she was was found to have Stage I anterior, Stage II posterior, Stage I apical prolapse.  Urodynamic Impression:  1. Sensation was increased; capacity was normal 2. Stress Incontinence was not demonstrated. 3. Detrusor Overactivity was demonstrated without leakage. 4. Emptying was normal with a normal PVR, a sustained detrusor contraction present,  abdominal straining not present, normal urethral sphincter activity on EMG.  Past Medical History:  Diagnosis Date   Full incontinence-feces    History of anemia    History of cancer chemotherapy    right breast;   neo-adjuvent 07-24-2020  to 11-15-2020  and 12-05-2020  to 07-24-2021   History of external beam radiation therapy    right reast 02-04-2021  to 03-25-2021   History of gastric ulcer    remote hx -- resolved   Hypothyroidism, postsurgical    followed by pcp;     02/ 2012  s/p  right hemithyroidectomy  (follicular adenoma)   Malignant neoplasm of upper-outer quadrant of right breast in female, estrogen receptor positive ([place]) 06/2020   oncologist--- dr Lindi Adie;  DCIS high grade right breast , neo-adjuvant chemo completed 11-15-2020,  s/p right breast lumpectomy w/ node dissection on 12-11-2020, then chemo completed 07-24-2021 and radiation completed 03-25-2021   Polyarthralgia    PONV (postoperative nausea and vomiting)    Prolapse of vaginal vault after hysterectomy    and posterior prolapse   RA (rheumatoid arthritis) ([place])    rheumatologsit--- dr Amil Amen;  no treatment   Tachycardia    Tachycardia     Past Surgical  History:  Procedure Laterality Date   APPENDECTOMY  08/2006   exploratory laparatomy w/ appendectomy (ruptured) and repair bowel   BREAST LUMPECTOMY WITH RADIOACTIVE SEED AND SENTINEL LYMPH NODE BIOPSY Right 12/11/2020   Procedure: RIGHT BREAST LUMPECTOMY WITH RADIOACTIVE SEED X2 AND RIGHT AXILLARY SENTINEL LYMPH NODE BIOPSY;  Surgeon: Rolm Bookbinder, MD;  Location: [place];  Service: General;  Laterality: Right;   BREAST RECONSTRUCTION Right 12/20/2020   Procedure: RIGHT ONCOPLASTIC RECONSTRUCTION;  Surgeon: Irene Limbo, MD;  Location: [place];  Service: Plastics;  Laterality: Right;   BREAST REDUCTION SURGERY Left 12/20/2020   Procedure: LEFT BREAST REDUCTION;  Surgeon: Irene Limbo, MD;  Location: [place];  Service: Plastics;  Laterality: Left;   COLONOSCOPY N/A 06/15/2016   Procedure: COLONOSCOPY;  Surgeon: Rogene Houston, MD;  [facility];  Service: Endoscopy;  Laterality: N/A;  [place]  06/03/2011   w/ suction for miscarriage   EXCISION OF BREAST BIOPSY Right 1999   INCISION AND DRAINAGE OF WOUND Right 01/10/2021   Procedure: INCISION AND DRAINAGE OF RIGHT AXILLA;  Surgeon: Irene Limbo, MD;  Location: [place];  Service: Plastics;  Laterality: Right;   OVUM / OOCYTE RETRIEVAL     2010  and 2012   PORTACATH PLACEMENT Left 07/23/2020   Procedure: INSERTION PORT-A-CATH;  Surgeon: Rolm Bookbinder, MD;  Location: [place];  Service: General;  Laterality: Left;   THYROID LOBECTOMY Right 05/2010   [facility]   VAGINAL HYSTERECTOMY  03/21/2019   @  [facility] by dr Barrie Dunker;   w/ Gaspar Garbe culdoplasty    is allergic to codeine, hyaluronic acid [sodium hyaluronate], hydrocodone, and latex.   Family History  Problem Relation Age of Onset   Hypertension Mother    Breast cancer Mother    Lymphoma Mother    Hypertension Brother    Breast cancer  Maternal Aunt    Lymphoma Maternal Grandfather    Breast cancer Maternal Aunt     Social History   Tobacco Use   Smoking status: Never   Smokeless tobacco: Never  Vaping Use   Vaping Use: Never used  Substance Use Topics   Alcohol use: Not Currently    Comment: rare   Drug use: Never     Review of Systems was negative for a full 10 system review except as noted in the History of Present Illness.  No current facility-administered medications for this encounter.  Current Outpatient Medications:    BYSTOLIC 5 MG tablet, Take 1 tablet by mouth at bedtime., Disp: , Rfl: 1   cetirizine (ZYRTEC) 10 MG tablet, Take 10 mg by mouth at bedtime., Disp: , Rfl:    clobetasol ointment (TEMOVATE) 6.29 %, Apply 1 Application topically 2 (two) times daily as needed., Disp: , Rfl:    estradiol (ESTRACE) 0.1 MG/GM vaginal cream, Place 0.5g nightly for two weeks then twice a week after (Patient taking differently: 1 Applicatorful 2 (two) times a week. Place 0.5g nightly for two weeks then twice a week after), Disp: 30 g, Rfl: 11   levothyroxine (SYNTHROID, LEVOTHROID) 50 MCG tablet, Take 50 mcg by mouth at bedtime., Disp: , Rfl:    metroNIDAZOLE (METROCREAM) 0.75 % cream, Apply 1 application  topically 2 (two) times daily as needed., Disp: , Rfl:    pantoprazole (PROTONIX) 40 MG tablet, Take 40 mg by mouth at bedtime., Disp: , Rfl:    Polyethylene Glycol 3350 (MIRALAX PO), Take by mouth daily as needed., Disp: , Rfl:    triamcinolone (NASACORT) 55 MCG/ACT AERO nasal inhaler, Place 2 sprays into the nose daily as needed. Pt uses during allergy season, Disp: , Rfl:    WEGOVY 2.4 MG/0.75ML SOAJ, Inject 2.4 mg into the skin once a week. Friday's, Disp: , Rfl:    acetaminophen (TYLENOL) 500 MG tablet, Take 1 tablet (500 mg total) by mouth every 6 (six) hours as needed (pain)., Disp: 30 tablet, Rfl: 0   gabapentin (NEURONTIN) 100 MG capsule, Take 1 capsule (100 mg total) by mouth 3 (three) times daily.,  Disp: 90 capsule, Rfl: 0   ibuprofen (ADVIL) 600 MG tablet, Take 1 tablet (600 mg total) by mouth every 6 (six) hours as needed., Disp: 30 tablet, Rfl: 0   oxyCODONE (OXY IR/ROXICODONE) 5 MG immediate release tablet, Take 1 tablet (5 mg total) by mouth every 4 (four) hours as needed for severe pain., Disp: 5 tablet, Rfl: 0   polyethylene glycol powder (GLYCOLAX/MIRALAX) 17 GM/SCOOP powder, Take 17 g by mouth daily. Drink 17g (1 scoop) dissolved in water per day., Disp: 255 g, Rfl: 0   tamoxifen (NOLVADEX) 20 MG tablet, Take 1 tablet (20 mg total) by mouth daily. (Patient not taking: Reported on 01/15/2022), Disp: 90 tablet, Rfl: 3   Objective There were no vitals filed for this visit.   Gen: NAD CV: S1 S2 RRR Lungs: Clear to auscultation bilaterally Abd: soft, nontender   Previous Pelvic Exam showed: POP-Q:    POP-Q   -2  Aa   -2                                           Ba   -6                                              C    4.5                                            Gh   4                                            Pb   8.5                                            tvl    0                                            Ap   0                                            Bp                                                  D       Assessment/ Plan  The patient is a 51 y.o. year old with stage II POP scheduled to undergo Exam under anesthesia, posterior repair, perineorrhaphy, sacrospinous ligament fixation, anal sphincteroplasty.  [name], MD

## 2022-01-26 ENCOUNTER — Encounter (HOSPITAL_BASED_OUTPATIENT_CLINIC_OR_DEPARTMENT_OTHER)
Admission: RE | Disposition: A | Discharge status: Home / Self Care | Payer: Self-pay | Source: Home / Self Care | Attending: Obstetrics and Gynecology

## 2022-01-26 ENCOUNTER — Ambulatory Visit (HOSPITAL_BASED_OUTPATIENT_CLINIC_OR_DEPARTMENT_OTHER): Payer: BC Managed Care – PPO | Admitting: Anesthesiology

## 2022-01-26 ENCOUNTER — Encounter (HOSPITAL_BASED_OUTPATIENT_CLINIC_OR_DEPARTMENT_OTHER): Payer: Self-pay | Admitting: Obstetrics and Gynecology

## 2022-01-26 ENCOUNTER — Other Ambulatory Visit: Payer: Self-pay

## 2022-01-26 ENCOUNTER — Ambulatory Visit (HOSPITAL_BASED_OUTPATIENT_CLINIC_OR_DEPARTMENT_OTHER)
Admission: RE | Admit: 2022-01-26 | Discharge: 2022-01-27 | Disposition: A | Discharge status: Home / Self Care | Payer: BC Managed Care – PPO | Attending: Obstetrics and Gynecology | Admitting: Obstetrics and Gynecology

## 2022-01-26 DIAGNOSIS — R159 Full incontinence of feces: Secondary | ICD-10-CM | POA: Insufficient documentation

## 2022-01-26 DIAGNOSIS — N816 Rectocele: Secondary | ICD-10-CM | POA: Diagnosis not present

## 2022-01-26 DIAGNOSIS — I499 Cardiac arrhythmia, unspecified: Secondary | ICD-10-CM | POA: Insufficient documentation

## 2022-01-26 DIAGNOSIS — Z01818 Encounter for other preprocedural examination: Secondary | ICD-10-CM

## 2022-01-26 DIAGNOSIS — E039 Hypothyroidism, unspecified: Secondary | ICD-10-CM | POA: Diagnosis not present

## 2022-01-26 DIAGNOSIS — N993 Prolapse of vaginal vault after hysterectomy: Secondary | ICD-10-CM | POA: Diagnosis present

## 2022-01-26 HISTORY — DX: Personal history of irradiation: Z92.3

## 2022-01-26 HISTORY — DX: Full incontinence of feces: R15.9

## 2022-01-26 HISTORY — DX: Other specified postprocedural states: Z98.890

## 2022-01-26 HISTORY — DX: Tachycardia, unspecified: R00.0

## 2022-01-26 HISTORY — DX: Postprocedural hypothyroidism: E89.0

## 2022-01-26 HISTORY — DX: Pain in unspecified joint: M25.50

## 2022-01-26 HISTORY — DX: Personal history of diseases of the blood and blood-forming organs and certain disorders involving the immune mechanism: Z86.2

## 2022-01-26 HISTORY — DX: Personal history of antineoplastic chemotherapy: Z92.21

## 2022-01-26 HISTORY — PX: ANTERIOR AND POSTERIOR REPAIR WITH SACROSPINOUS FIXATION: SHX6536

## 2022-01-26 HISTORY — DX: Rheumatoid arthritis, unspecified: M06.9

## 2022-01-26 HISTORY — DX: Personal history of peptic ulcer disease: Z87.11

## 2022-01-26 HISTORY — DX: Prolapse of vaginal vault after hysterectomy: N99.3

## 2022-01-26 HISTORY — PX: PERINEOPLASTY: SHX2218

## 2022-01-26 LAB — CBC
HCT: 32.9 % — ABNORMAL LOW (ref 36.0–46.0)
Hemoglobin: 10.7 g/dL — ABNORMAL LOW (ref 12.0–15.0)
MCH: 29.9 pg (ref 26.0–34.0)
MCHC: 32.5 g/dL (ref 30.0–36.0)
MCV: 91.9 fL (ref 80.0–100.0)
Platelets: 241 10*3/uL (ref 150–400)
RBC: 3.58 MIL/uL — ABNORMAL LOW (ref 3.87–5.11)
RDW: 13.5 % (ref 11.5–15.5)
WBC: 11.8 10*3/uL — ABNORMAL HIGH (ref 4.0–10.5)
nRBC: 0 % (ref 0.0–0.2)

## 2022-01-26 SURGERY — ANTERIOR AND POSTERIOR REPAIR WITH SACROSPINOUS FIXATION
Anesthesia: General | Site: Vagina

## 2022-01-26 MED ORDER — ONDANSETRON HCL 4 MG PO TABS: 4.0000 mg | ORAL_TABLET | Freq: Four times a day (QID) | ORAL | Status: DC | PRN

## 2022-01-26 MED ORDER — OXYCODONE HCL 5 MG PO TABS
ORAL_TABLET | ORAL | Status: AC
  Filled 2022-01-26: qty 2

## 2022-01-26 MED ORDER — HYDROMORPHONE HCL 1 MG/ML IJ SOLN
INTRAMUSCULAR | Status: DC | PRN
  Administered 2022-01-26 (×2): .5 mg via INTRAVENOUS

## 2022-01-26 MED ORDER — DOCUSATE SODIUM 100 MG PO CAPS
100.0000 mg | ORAL_CAPSULE | Freq: Two times a day (BID) | ORAL | Status: DC
  Administered 2022-01-26: 100 mg via ORAL

## 2022-01-26 MED ORDER — DEXAMETHASONE SODIUM PHOSPHATE 10 MG/ML IJ SOLN
INTRAMUSCULAR | Status: DC | PRN
  Administered 2022-01-26: 10 mg via INTRAVENOUS

## 2022-01-26 MED ORDER — MEPERIDINE HCL 25 MG/ML IJ SOLN: 6.2500 mg | INTRAMUSCULAR | Status: DC | PRN

## 2022-01-26 MED ORDER — LACTATED RINGERS IV SOLN: INTRAVENOUS | Status: DC

## 2022-01-26 MED ORDER — EPHEDRINE SULFATE-NACL 50-0.9 MG/10ML-% IV SOSY
PREFILLED_SYRINGE | INTRAVENOUS | Status: DC | PRN
  Administered 2022-01-26: 15 mg via INTRAVENOUS
  Administered 2022-01-26: 10 mg via INTRAVENOUS

## 2022-01-26 MED ORDER — OXYCODONE HCL 5 MG PO TABS: 5.0000 mg | ORAL_TABLET | Freq: Once | ORAL | Status: DC | PRN

## 2022-01-26 MED ORDER — ONDANSETRON HCL 4 MG/2ML IJ SOLN: 4.0000 mg | Freq: Four times a day (QID) | INTRAMUSCULAR | Status: DC | PRN

## 2022-01-26 MED ORDER — CYCLOBENZAPRINE HCL 10 MG PO TABS
10.0000 mg | ORAL_TABLET | Freq: Once | ORAL | Status: AC
  Administered 2022-01-26: 10 mg via ORAL
  Filled 2022-01-26: qty 1

## 2022-01-26 MED ORDER — CEFAZOLIN SODIUM-DEXTROSE 2-4 GM/100ML-% IV SOLN
2.0000 g | INTRAVENOUS | Status: AC
  Administered 2022-01-26: 2 g via INTRAVENOUS

## 2022-01-26 MED ORDER — DIPHENHYDRAMINE HCL 50 MG/ML IJ SOLN
INTRAMUSCULAR | Status: DC | PRN
  Administered 2022-01-26: 12.5 mg via INTRAVENOUS

## 2022-01-26 MED ORDER — POLYETHYLENE GLYCOL 3350 17 G PO PACK: 17.0000 g | PACK | Freq: Every day | ORAL | Status: DC

## 2022-01-26 MED ORDER — ONDANSETRON HCL 4 MG/2ML IJ SOLN
INTRAMUSCULAR | Status: DC | PRN
  Administered 2022-01-26: 4 mg via INTRAVENOUS

## 2022-01-26 MED ORDER — IBUPROFEN 200 MG PO TABS: 600.0000 mg | ORAL_TABLET | Freq: Four times a day (QID) | ORAL | Status: DC

## 2022-01-26 MED ORDER — PROPOFOL 10 MG/ML IV BOLUS
INTRAVENOUS | Status: DC | PRN
  Administered 2022-01-26: 180 mg via INTRAVENOUS
  Administered 2022-01-26: 20 mg via INTRAVENOUS

## 2022-01-26 MED ORDER — SIMETHICONE 80 MG PO CHEW: 80.0000 mg | CHEWABLE_TABLET | Freq: Four times a day (QID) | ORAL | Status: DC | PRN

## 2022-01-26 MED ORDER — ACETAMINOPHEN 500 MG PO TABS
1000.0000 mg | ORAL_TABLET | ORAL | Status: AC
  Administered 2022-01-26: 1000 mg via ORAL

## 2022-01-26 MED ORDER — HYDROMORPHONE HCL 1 MG/ML IJ SOLN
INTRAMUSCULAR | Status: AC
  Filled 2022-01-26: qty 1

## 2022-01-26 MED ORDER — DOCUSATE SODIUM 100 MG PO CAPS
ORAL_CAPSULE | ORAL | Status: AC
  Filled 2022-01-26: qty 1

## 2022-01-26 MED ORDER — KETOROLAC TROMETHAMINE 30 MG/ML IJ SOLN
30.0000 mg | Freq: Four times a day (QID) | INTRAMUSCULAR | Status: DC
  Administered 2022-01-26 – 2022-01-27 (×2): 30 mg via INTRAVENOUS

## 2022-01-26 MED ORDER — GABAPENTIN 100 MG PO CAPS
100.0000 mg | ORAL_CAPSULE | Freq: Three times a day (TID) | ORAL | Status: DC
  Administered 2022-01-26 – 2022-01-27 (×2): 100 mg via ORAL

## 2022-01-26 MED ORDER — SUGAMMADEX SODIUM 200 MG/2ML IV SOLN
INTRAVENOUS | Status: DC | PRN
  Administered 2022-01-26: 350 mg via INTRAVENOUS

## 2022-01-26 MED ORDER — PHENYLEPHRINE 80 MCG/ML (10ML) SYRINGE FOR IV PUSH (FOR BLOOD PRESSURE SUPPORT)
PREFILLED_SYRINGE | INTRAVENOUS | Status: DC | PRN
  Administered 2022-01-26 (×2): 80 ug via INTRAVENOUS

## 2022-01-26 MED ORDER — PROMETHAZINE HCL 25 MG/ML IJ SOLN: 6.2500 mg | INTRAMUSCULAR | Status: DC | PRN

## 2022-01-26 MED ORDER — CEFAZOLIN SODIUM-DEXTROSE 2-4 GM/100ML-% IV SOLN
INTRAVENOUS | Status: AC
  Filled 2022-01-26: qty 100

## 2022-01-26 MED ORDER — GABAPENTIN 300 MG PO CAPS
ORAL_CAPSULE | ORAL | Status: AC
  Filled 2022-01-26: qty 1

## 2022-01-26 MED ORDER — LIDOCAINE 2% (20 MG/ML) 5 ML SYRINGE
INTRAMUSCULAR | Status: DC | PRN
  Administered 2022-01-26: 80 mg via INTRAVENOUS

## 2022-01-26 MED ORDER — OXYCODONE HCL 5 MG PO TABS
5.0000 mg | ORAL_TABLET | ORAL | Status: DC | PRN
  Administered 2022-01-26 (×2): 5 mg via ORAL
  Administered 2022-01-26: 10 mg via ORAL
  Administered 2022-01-27 (×2): 5 mg via ORAL

## 2022-01-26 MED ORDER — GABAPENTIN 100 MG PO CAPS
ORAL_CAPSULE | ORAL | Status: AC
  Filled 2022-01-26: qty 1

## 2022-01-26 MED ORDER — MORPHINE SULFATE (PF) 4 MG/ML IV SOLN: 1.0000 mg | INTRAVENOUS | Status: DC | PRN

## 2022-01-26 MED ORDER — FENTANYL CITRATE (PF) 250 MCG/5ML IJ SOLN
INTRAMUSCULAR | Status: AC
  Filled 2022-01-26: qty 5

## 2022-01-26 MED ORDER — HYDROMORPHONE HCL 1 MG/ML IJ SOLN
0.2500 mg | INTRAMUSCULAR | Status: DC | PRN
  Administered 2022-01-26 (×4): 0.5 mg via INTRAVENOUS

## 2022-01-26 MED ORDER — MIDAZOLAM HCL 2 MG/2ML IJ SOLN
INTRAMUSCULAR | Status: AC
  Filled 2022-01-26: qty 2

## 2022-01-26 MED ORDER — ACETAMINOPHEN 325 MG PO TABS: 650.0000 mg | ORAL_TABLET | ORAL | Status: DC | PRN

## 2022-01-26 MED ORDER — PROPOFOL 10 MG/ML IV BOLUS
INTRAVENOUS | Status: AC
  Filled 2022-01-26: qty 20

## 2022-01-26 MED ORDER — ROCURONIUM BROMIDE 10 MG/ML (PF) SYRINGE
PREFILLED_SYRINGE | INTRAVENOUS | Status: DC | PRN
  Administered 2022-01-26: 90 mg via INTRAVENOUS

## 2022-01-26 MED ORDER — KETOROLAC TROMETHAMINE 30 MG/ML IJ SOLN
INTRAMUSCULAR | Status: AC
  Filled 2022-01-26: qty 1

## 2022-01-26 MED ORDER — 0.9 % SODIUM CHLORIDE (POUR BTL) OPTIME
TOPICAL | Status: DC | PRN
  Administered 2022-01-26: 500 mL

## 2022-01-26 MED ORDER — CYCLOBENZAPRINE HCL 10 MG PO TABS: 10.0000 mg | ORAL_TABLET | Freq: Three times a day (TID) | ORAL | 1 refills | Status: DC | PRN

## 2022-01-26 MED ORDER — OXYCODONE HCL 5 MG/5ML PO SOLN: 5.0000 mg | Freq: Once | ORAL | Status: DC | PRN

## 2022-01-26 MED ORDER — MIDAZOLAM HCL 2 MG/2ML IJ SOLN
INTRAMUSCULAR | Status: DC | PRN
  Administered 2022-01-26: 2 mg via INTRAVENOUS

## 2022-01-26 MED ORDER — LIDOCAINE HCL (PF) 2 % IJ SOLN
INTRAMUSCULAR | Status: AC
  Filled 2022-01-26: qty 5

## 2022-01-26 MED ORDER — FENTANYL CITRATE (PF) 100 MCG/2ML IJ SOLN
INTRAMUSCULAR | Status: DC | PRN
  Administered 2022-01-26 (×3): 50 ug via INTRAVENOUS

## 2022-01-26 MED ORDER — ACETAMINOPHEN 500 MG PO TABS
ORAL_TABLET | ORAL | Status: AC
  Filled 2022-01-26: qty 2

## 2022-01-26 MED ORDER — GABAPENTIN 300 MG PO CAPS
300.0000 mg | ORAL_CAPSULE | ORAL | Status: AC
  Administered 2022-01-26: 300 mg via ORAL

## 2022-01-26 MED ORDER — CYCLOBENZAPRINE HCL 10 MG PO TABS: 10.0000 mg | ORAL_TABLET | Freq: Three times a day (TID) | ORAL | Status: DC | PRN

## 2022-01-26 MED ORDER — LIDOCAINE-EPINEPHRINE 1 %-1:100000 IJ SOLN
INTRAMUSCULAR | Status: DC | PRN
  Administered 2022-01-26: 20 mL

## 2022-01-26 SURGICAL SUPPLY — 39 items
AGENT HMST KT MTR STRL THRMB (HEMOSTASIS)
BLADE CLIPPER SENSICLIP SURGIC (BLADE) ×2 IMPLANT
BLADE SURG 15 STRL LF DISP TIS (BLADE) ×2 IMPLANT
BLADE SURG 15 STRL SS (BLADE) ×2
DEVICE CAPIO SLIM SINGLE (INSTRUMENTS) IMPLANT
GAUZE 4X4 16PLY ~~LOC~~+RFID DBL (SPONGE) ×2 IMPLANT
GLOVE BIO SURGEON STRL SZ 6 (GLOVE) ×2 IMPLANT
GLOVE BIOGEL PI IND STRL 6.5 (GLOVE) ×2 IMPLANT
GLOVE BIOGEL PI IND STRL 7.0 (GLOVE) ×2 IMPLANT
GOWN STRL REUS W/TWL LRG LVL3 (GOWN DISPOSABLE) ×2 IMPLANT
HIBICLENS CHG 4% 4OZ BTL (MISCELLANEOUS) ×2 IMPLANT
HOLDER FOLEY CATH W/STRAP (MISCELLANEOUS) ×2 IMPLANT
KIT TURNOVER CYSTO (KITS) ×2 IMPLANT
NDL MAYO 6 CRC TAPER PT (NEEDLE) IMPLANT
NEEDLE HYPO 22GX1.5 SAFETY (NEEDLE) ×2 IMPLANT
NEEDLE MAYO 6 CRC TAPER PT (NEEDLE) ×2 IMPLANT
NS IRRIG 1000ML POUR BTL (IV SOLUTION) ×2 IMPLANT
NS IRRIG 500ML POUR BTL (IV SOLUTION) IMPLANT
PACK VAGINAL WOMENS (CUSTOM PROCEDURE TRAY) ×2 IMPLANT
RETRACTOR LONE STAR DISPOSABLE (INSTRUMENTS) ×2 IMPLANT
RETRACTOR STAY HOOK 5MM (MISCELLANEOUS) ×2 IMPLANT
SET IRRIG Y TYPE TUR BLADDER L (SET/KITS/TRAYS/PACK) IMPLANT
SPIKE FLUID TRANSFER (MISCELLANEOUS) IMPLANT
SUCTION FRAZIER HANDLE 10FR (MISCELLANEOUS) ×2
SUCTION TUBE FRAZIER 10FR DISP (MISCELLANEOUS) ×2 IMPLANT
SURGIFLO W/THROMBIN 8M KIT (HEMOSTASIS) IMPLANT
SUT ABS MONO DBL WITH NDL 48IN (SUTURE) IMPLANT
SUT MON AB 2-0 SH 27 (SUTURE) IMPLANT
SUT PDS PLUS 0 (SUTURE) ×4
SUT PDS PLUS AB 0 CT-2 (SUTURE) IMPLANT
SUT VIC AB 0 CT1 27 (SUTURE) ×4
SUT VIC AB 0 CT1 27XBRD ANTBC (SUTURE) IMPLANT
SUT VIC AB 2-0 SH 27 (SUTURE)
SUT VIC AB 2-0 SH 27XBRD (SUTURE) IMPLANT
SUT VIC AB 3-0 SH 18 (SUTURE) IMPLANT
SUT VICRYL 2-0 SH 8X27 (SUTURE) ×2 IMPLANT
SYR BULB EAR ULCER 3OZ GRN STR (SYRINGE) ×2 IMPLANT
TOWEL OR 17X26 10 PK STRL BLUE (TOWEL DISPOSABLE) ×2 IMPLANT
TRAY FOLEY W/BAG SLVR 14FR LF (SET/KITS/TRAYS/PACK) ×2 IMPLANT

## 2022-01-26 NOTE — Discharge Instructions (Addendum)

## 2022-01-26 NOTE — Anesthesia Preprocedure Evaluation (Signed)
Anesthesia Evaluation  Patient identified by MRN, date of birth, ID band Patient awake    Reviewed: Allergy & Precautions, NPO status , Patient's Chart, lab work & pertinent test results  History of Anesthesia Complications (+) PONV and history of anesthetic complications  Airway Mallampati: II  TM Distance: >3 FB Neck ROM: Full    Dental no notable dental hx. (+) Teeth Intact, Dental Advisory Given   Pulmonary neg pulmonary ROS,    Pulmonary exam normal breath sounds clear to auscultation       Cardiovascular Normal cardiovascular exam+ dysrhythmias  Rhythm:Regular Rate:Normal  12/27/20 TTE 1. GLS stable was -19.9 on 10/18/20. Left ventricular ejection fraction, by  estimation, is 60 to 65%. The left ventricle has normal function. The left  ventricle has no regional wall motion abnormalities. Left ventricular  diastolic parameters were normal.  The average left ventricular global longitudinal strain is -20.6 %. The  global longitudinal strain is normal.  2. Right ventricular systolic function is normal. The right ventricular  size is normal.  3. The mitral valve is normal in structure. No evidence of mitral valve  regurgitation. No evidence of mitral stenosis.  4. The aortic valve is tricuspid. Aortic valve regurgitation is not  visualized. No aortic stenosis is present.  5. The inferior vena cava is normal in size with greater than 50%  respiratory variability, suggesting right atrial pressure of 3 mmHg.    Neuro/Psych Anxiety negative neurological ROS  negative psych ROS   GI/Hepatic Neg liver ROS,   Endo/Other  Hypothyroidism   Renal/GU negative Renal ROS     Musculoskeletal  (+) Arthritis , Rheumatoid disorders,    Abdominal (+) - obese (BMi 32.47),   Peds  Hematology   Anesthesia Other Findings S/P R Lumpectomy  Reproductive/Obstetrics                             Anesthesia  Physical  Anesthesia Plan  ASA: 2  Anesthesia Plan: General   Post-op Pain Management: Tylenol PO (pre-op)*, Gabapentin PO (pre-op)* and Dilaudid IV   Induction: Intravenous  PONV Risk Score and Plan: 4 or greater and Treatment may vary due to age or medical condition, Midazolam, Dexamethasone, Ondansetron and Droperidol  Airway Management Planned: Oral ETT  Additional Equipment: None  Intra-op Plan:   Post-operative Plan: Extubation in OR  Informed Consent: I have reviewed the patients History and Physical, chart, labs and discussed the procedure including the risks, benefits and alternatives for the proposed anesthesia with the patient or authorized representative who has indicated his/her understanding and acceptance.     Dental advisory given  Plan Discussed with:   Anesthesia Plan Comments:         Anesthesia Quick Evaluation

## 2022-01-26 NOTE — Transfer of Care (Signed)
Immediate Anesthesia Transfer of Care Note  Patient: Sandy Goodwin  Procedure(s) Performed: Procedure(s) (LRB): POSTERIOR REPAIR WITH SACROSPINOUS FIXATION (N/A) PERINEORRHAPY (N/A) ANAL SPHINCTEROPLASTY (N/A)  Patient Location: PACU  Anesthesia Type: General  Level of Consciousness: awake, alert  and oriented  Airway & Oxygen Therapy: Patient Spontanous Breathing   Post-op Assessment: Report given to PACU RN and Post -op Vital signs reviewed and stable  Post vital signs: Reviewed and stable  Complications: No apparent anesthesia complications  Last Vitals:  Vitals Value Taken Time  BP 115/70 01/26/22 1415  Temp    Pulse 75 01/26/22 1420  Resp 13 01/26/22 1420  SpO2 93 % 01/26/22 1420  Vitals shown include unvalidated device data.  Last Pain:  Vitals:   01/26/22 1407  TempSrc:   PainSc: 0-No pain      Patients Stated Pain Goal: 3 (93/81/01 7510)  Complications: No notable events documented.

## 2022-01-26 NOTE — Anesthesia Procedure Notes (Signed)
Procedure Name: Intubation Date/Time: 01/26/2022 12:17 PM  Performed by: Mechele Claude, CRNAPre-anesthesia Checklist: Patient identified, Emergency Drugs available, Suction available and Patient being monitored Patient Re-evaluated:Patient Re-evaluated prior to induction Oxygen Delivery Method: Circle system utilized Preoxygenation: Pre-oxygenation with 100% oxygen Induction Type: IV induction Ventilation: Mask ventilation without difficulty Laryngoscope Size: Mac and 3 Tube type: Oral Number of attempts: 1 Airway Equipment and Method: Stylet and Oral airway Placement Confirmation: ETT inserted through vocal cords under direct vision, positive ETCO2 and breath sounds checked- equal and bilateral Secured at: 22 cm Tube secured with: Tape Dental Injury: Teeth and Oropharynx as per pre-operative assessment

## 2022-01-26 NOTE — Interval H&P Note (Signed)
History and Physical Interval Note:  01/26/2022 11:36 AM  Sandy Goodwin  has presented today for surgery, with the diagnosis of prolapse of posterior vagina; prolapse of vaginal vault after hysterectomy; full incontinence of feces.  The various methods of treatment have been discussed with the patient and family. After consideration of risks, benefits and other options for treatment, the patient has consented to  Procedure(s) with comments: POSTERIOR REPAIR WITH SACROSPINOUS FIXATION (N/A) PERINEORRHAPY (N/A) ANAL SPHINCTEROPLASTY (N/A) as a surgical intervention.  The patient's history has been reviewed, patient examined, no change in status, stable for surgery.  I have reviewed the patient's chart and labs.  Questions were answered to the patient's satisfaction.     [name]

## 2022-01-26 NOTE — Progress Notes (Signed)
Was the fall witnessed: yes  Patient condition before and after the fall: alert, oriented, unchanged  Patient's reaction to the fall: none  Name of the doctor that was notified including date and time: Dr. Christin Fudge 01/26/2022 1639  Any interventions and vital signs: see vital signs sheet, fluids increased.

## 2022-01-26 NOTE — Op Note (Signed)
Operative Note  Preoperative Diagnosis: posterior vaginal prolapse, vaginal vault prolapse after hysterectomy, and fecal incontinence  Postoperative Diagnosis: same  Procedures performed:  Posterior repair, sacrospinous ligament fixation, perineorrhaphy, anal sphincteroplasty  Implants: none  Attending Surgeon: Sherlene Shams, MD  Anesthesia: General endotracheal  Findings: 1. On vaginal exam, stage 2 posterior vaginal prolapse noted.   2.  Thin perineal body with disrupted anal sphincter complex  Specimens: none  Estimated blood loss: 100 mL  IV fluids: 700 mL  Urine output: 989 mL  Complications: none  Procedure in Detail:  After informed consent was obtained, the patient was taken to the operating room where anesthesia was induced and found to be adequate. She was placed in dorsal lithotomy position, taking care to avoid any traction on the extremities, and then prepped and draped in the usual sterile fashion. A self-retaining lonestar retractor was placed using four elastic blue stays.  After a foley catheter was inserted into the urethra.  Vaginal exam concluded that an apical procedure was also needed. Two Allis clamps were along the posterior vaginal wall defect. 1% lidocaine with epinephrine was injected into the vaginal mucosa.  A vertical incision was made between these two Allis clamps with a 15 blade scalpel.  Allis clamps were placed along this incision and Metzenbaum scissors were used to undermine the vaginal mucosa along the incision.  The vaginal mucosa was then sharply dissected off to the septum bilaterally.    For the sacrospinous ligament fixation (SSLF), the ischial spine was accessed on the right side via dissection with scissors and blunt dissection.  The sacrospinous ligament was palpated. Two 0 PDS suture was then placed at the sacrospinous ligament two fingerbreadths medial to the ischial spine, in order to avoid the pudendal neurovascular bundle, using  a Capio needle driver.  The PDS suture was attached to the vaginal epithelium on the ipsilateral side of the vaginal apex and held. Posterior plication of the rectovaginal septum was then performed using plicating sutures of 2-0 Vicryl. The last distal stitch incorporated the perineal body in a U stitch fashion. The vaginal mucosal edges were trimmed and the incision reapproximated with 2-0 Vicryl in a running fashion. The SSLF suture was then tied down with excellent support of the posterior and apical vagina.  The perineorrhaphy and anal sphincteroplasty was performed next. The perineum was injected with 1% lidocaine with epinephrine. Two allis clamps were placed a the introitus and a narrow diamond shaped incision was made over the perineum. The excess skin was removed. Dissection was performed with metzenbaum scissors to remove the epithelium from the underlying tissue. The edges of the anal sphincter were identified bilaterally and held with allis clamps. The ischorectal fossa was dissected until the anal sphincter had good mobility. Hemostasis was obtained with cautery and 2-0 vicryl suture. An overlapping sphincteroplasty (left over right) was then performed by approximating the distal end of the left anal sphincter with the proximal end of the right with an 0-PDS horizontal mattress. This was then repeated twice more to bring the proximal end of the left together with the distal end of the right sphincter. All sutures were tied down.  A 0-vicryl suture was then used to reapproximate the perineal body in a stack of 3 interrupted stiches. The subcutaneous perineal skin was then closed the a 2-0 vicryl running suture. The epithelial edges were reapproximated with interrupted horizontal mattress sutures of 2-0 vicryl. Good hemostasis was noted.  A rectal examination was normal and confirmed no sutures within  the rectum. The patient tolerated the procedure well.  She was awakened from anesthesia and transferred  to the recovery room in stable condition. All counts were correct x 2.    Sandy Folds, MD

## 2022-01-27 ENCOUNTER — Encounter (HOSPITAL_BASED_OUTPATIENT_CLINIC_OR_DEPARTMENT_OTHER): Payer: Self-pay | Admitting: Obstetrics and Gynecology

## 2022-01-27 DIAGNOSIS — N993 Prolapse of vaginal vault after hysterectomy: Secondary | ICD-10-CM | POA: Diagnosis not present

## 2022-01-27 MED ORDER — OXYCODONE HCL 5 MG PO TABS
ORAL_TABLET | ORAL | Status: AC
  Filled 2022-01-27: qty 1

## 2022-01-27 MED ORDER — KETOROLAC TROMETHAMINE 30 MG/ML IJ SOLN
INTRAMUSCULAR | Status: AC
  Filled 2022-01-27: qty 1

## 2022-01-27 MED ORDER — GABAPENTIN 100 MG PO CAPS
ORAL_CAPSULE | ORAL | Status: AC
  Filled 2022-01-27: qty 1

## 2022-01-27 NOTE — Anesthesia Postprocedure Evaluation (Signed)
Anesthesia Post Note  Patient: Sandy Goodwin  Procedure(s) Performed: POSTERIOR REPAIR WITH SACROSPINOUS FIXATION (Vagina ) PERINEORRHAPY (Vagina ) ANAL SPHINCTEROPLASTY (Anus)     Patient location during evaluation: PACU Anesthesia Type: General Level of consciousness: awake and alert Pain management: pain level controlled Vital Signs Assessment: post-procedure vital signs reviewed and stable Respiratory status: spontaneous breathing, nonlabored ventilation and respiratory function stable Cardiovascular status: blood pressure returned to baseline and stable Postop Assessment: no apparent nausea or vomiting Anesthetic complications: no   No notable events documented.  Last Vitals:  Vitals:   01/27/22 0300 01/27/22 0529  BP: (!) 92/55 101/60  Pulse: 75 70  Resp: 12 12  Temp: 36.8 C 36.7 C  SpO2: 98% 94%    Last Pain:  Vitals:   01/27/22 0529  TempSrc:   PainSc: [place]

## 2022-01-27 NOTE — Progress Notes (Signed)
Back filled bladder with 300 cc of NS. Patient was able to void 400 cc. Bladder scan post void was 7 cc.

## 2022-01-27 NOTE — Discharge Summary (Signed)
Physician Discharge Summary  Patient ID: Sandy Goodwin MRN: [id number] DOB/AGE: September 12, 1970 51 y.o.  Admit date: 01/26/2022 Discharge date: 01/27/2022  Admission Diagnoses:  Discharge Diagnoses:  Principal Problem:   Prolapse of posterior vaginal wall   Discharged Condition: good  Hospital Course: 51yo F underwent Posterior repair, sacrospinous ligament fixation, perineorrhaphy and anal sphincteroplasty on 01/26/22. Her pain was not well controlled post-operatively and she had a syncopal episode with standing. She was observed overnight. Pain was controlled with oral medications- oxycodone, ibuprofen, tylenol, gabapentin and flexeril. She was able to tolerate her diet. She failed her voiding trial on POD#0 and it was repeated on POD #1 and she passed. Voided 482m with a PVR of 727m She had minimal vaginal bleeding. She was ambulating well on discharge.   Consults: None  Significant Diagnostic Studies:  Lab Results  Component Value Date   WBC 11.8 (H) 01/26/2022   HGB 10.7 (L) 01/26/2022   HCT 32.9 (L) 01/26/2022   MCV 91.9 01/26/2022   PLT 241 01/26/2022     Treatments: surgery: see above  Discharge Exam: Blood pressure 101/60, pulse 70, temperature 98.1 F (36.7 C), resp. rate 12, height 5' 6.25" (1.683 m), weight 84.5 kg, SpO2 94 %. General appearance: alert and cooperative Resp: normal respirations GI: soft, non-tender; bowel sounds normal; no masses,  no organomegaly Extremities: extremities normal, atraumatic, no cyanosis or edema  Disposition: Discharge disposition: 01-Home or Self Care       Discharge Instructions     Call MD for:  persistant nausea and vomiting   Complete by: As directed    Call MD for:  persistant nausea and vomiting   Complete by: As directed    Call MD for:  redness, tenderness, or signs of infection (pain, swelling, redness, odor or green/yellow discharge around incision site)   Complete by: As directed    Call MD for:  redness,  tenderness, or signs of infection (pain, swelling, redness, odor or green/yellow discharge around incision site)   Complete by: As directed    Call MD for:  severe uncontrolled pain   Complete by: As directed    Call MD for:  severe uncontrolled pain   Complete by: As directed    Call MD for:  temperature >100.4   Complete by: As directed    Call MD for:  temperature >100.4   Complete by: As directed    Diet general   Complete by: As directed    Diet general   Complete by: As directed    Increase activity slowly   Complete by: As directed    Increase activity slowly   Complete by: As directed    May walk up steps   Complete by: As directed    May walk up steps   Complete by: As directed    No wound care   Complete by: As directed       Allergies as of 01/27/2022       Reactions   Codeine Other (See Comments)   Makes pt agitated and stay awake    Hyaluronic Acid [sodium Hyaluronate] Hives   Hydrocodone Other (See Comments)   Makes pt agitated and stay awake    Other    Adhesive and dermabond/skin glue - reaction about a week after procedure where skin became irritated and had rash   Latex Rash        Medication List     TAKE these medications    acetaminophen 500 MG tablet Commonly  known as: TYLENOL Take 1 tablet (500 mg total) by mouth every 6 (six) hours as needed (pain). What changed: additional instructions Notes to patient: Dose given at 11:16 AM before surgery.    Bystolic 5 MG tablet Generic drug: nebivolol Take 1 tablet by mouth at bedtime.   cetirizine 10 MG tablet Commonly known as: ZYRTEC Take 10 mg by mouth at bedtime.   clobetasol ointment 0.05 % Commonly known as: TEMOVATE Apply 1 Application topically 2 (two) times daily as needed.   cyclobenzaprine 10 MG tablet Commonly known as: FLEXERIL Take 1 tablet (10 mg total) by mouth 3 (three) times daily as needed for muscle spasms. Notes to patient: Dose given at 3:14 PM.   estradiol 0.1  MG/GM vaginal cream Commonly known as: ESTRACE Place 0.5g nightly for two weeks then twice a week after What changed:  how much to take when to take this   gabapentin 100 MG capsule Commonly known as: NEURONTIN Take 1 capsule (100 mg total) by mouth 3 (three) times daily. What changed: additional instructions Notes to patient: Next dose is due at 11 am today   ibuprofen 600 MG tablet Commonly known as: ADVIL Take 1 tablet (600 mg total) by mouth every 6 (six) hours as needed. What changed: additional instructions Notes to patient: Next dose is due at 1130 am today.   levothyroxine 50 MCG tablet Commonly known as: SYNTHROID Take 50 mcg by mouth at bedtime.   metroNIDAZOLE 0.75 % cream Commonly known as: METROCREAM Apply 1 application  topically 2 (two) times daily as needed.   MIRALAX PO Take by mouth daily as needed.   polyethylene glycol powder 17 GM/SCOOP powder Commonly known as: GLYCOLAX/MIRALAX Take 17 g by mouth daily. Drink 17g (1 scoop) dissolved in water per day.   oxyCODONE 5 MG immediate release tablet Commonly known as: Oxy IR/ROXICODONE Take 1 tablet (5 mg total) by mouth every 4 (four) hours as needed for severe pain. What changed: additional instructions Notes to patient: Next dose is due at 10 am    pantoprazole 40 MG tablet Commonly known as: PROTONIX Take 40 mg by mouth at bedtime.   tamoxifen 20 MG tablet Commonly known as: NOLVADEX Take 1 tablet (20 mg total) by mouth daily.   triamcinolone 55 MCG/ACT Aero nasal inhaler Commonly known as: NASACORT Place 2 sprays into the nose daily as needed. Pt uses during allergy season   Wegovy 2.4 MG/0.75ML Soaj Generic drug: Semaglutide-Weight Management Inject 2.4 mg into the skin once a week. Friday's         Signed: [name] 01/27/2022, 8:37 AM

## 2022-01-30 ENCOUNTER — Encounter (HOSPITAL_BASED_OUTPATIENT_CLINIC_OR_DEPARTMENT_OTHER): Payer: Self-pay | Admitting: *Deleted

## 2022-02-03 ENCOUNTER — Encounter: Payer: Self-pay | Admitting: Obstetrics and Gynecology

## 2022-02-03 ENCOUNTER — Ambulatory Visit (INDEPENDENT_AMBULATORY_CARE_PROVIDER_SITE_OTHER): Payer: BC Managed Care – PPO | Admitting: Obstetrics and Gynecology

## 2022-02-03 VITALS — BP 135/91 | HR 83

## 2022-02-03 DIAGNOSIS — T8149XA Infection following a procedure, other surgical site, initial encounter: Secondary | ICD-10-CM

## 2022-02-03 MED ORDER — CEPHALEXIN 250 MG PO CAPS: 250.0000 mg | ORAL_CAPSULE | Freq: Four times a day (QID) | ORAL | 0 refills | Status: AC

## 2022-02-03 MED ORDER — METRONIDAZOLE 500 MG PO TABS: 500.0000 mg | ORAL_TABLET | Freq: Two times a day (BID) | ORAL | 0 refills | Status: AC

## 2022-02-04 ENCOUNTER — Telehealth: Payer: Self-pay | Admitting: Obstetrics and Gynecology

## 2022-02-04 ENCOUNTER — Encounter: Payer: Self-pay | Admitting: Obstetrics and Gynecology

## 2022-02-04 NOTE — Progress Notes (Signed)
[facility]  Date of Visit: 02/03/2022  History of Present Illness: Sandy Goodwin is a 51 y.o. female scheduled today for a post-operative visit.   Surgery: s/p Posterior repair, sacrospinous ligament fixation, perineorrhaphy, anal sphincteroplasty on 01/26/22  She passed her postoperative void trial.   Over the weekend began experiencing increased pain in the perineal area, difficult to sit. Her husband looked and said it looks like stiches have come undone. Has noticed increased discharge and some spots of blood.    Medications: She has a current medication list which includes the following prescription(s): cephalexin, metronidazole, acetaminophen, bystolic, cetirizine, clobetasol ointment, cyclobenzaprine, estradiol, gabapentin, ibuprofen, levothyroxine, metronidazole, oxycodone, pantoprazole, polyethylene glycol 3350, polyethylene glycol powder, tamoxifen, triamcinolone, and wegovy.   Allergies: Patient is allergic to codeine, hyaluronic acid [sodium hyaluronate], hydrocodone, other, and latex.   Physical Exam: BP (!) 135/91   Pulse 83    Perineal Incision: inferior half of perineal incision disrupted with copious yellow discharge- approximately 2cm, with 1 cm depth. Culture obtained. Area gently cleaned. Anal sphincter sutures visible and intact. Mild erythema on surrounding skin.  Pelvic Examination: Vagina: Incisions healing well. Sutures are present at incision line and apex and intact with a small amount of blood present.   POP-Q: deferred ---------------------------------------------------------  Assessment and Plan:  1. Wound infection after surgery     - Will treat for wound infection with keflex '250mg'$  QID x 14 days and flagyl '500mg'$  BID x 14 days. Will narrow antibiotic regimen based on culture results.  - Will have her return in a week to reexamine. Discussed may need to pack area depending on depth of wound. Will let close with secondary intention for now but  may consider reappoximation of skin once infection is treated.   [name], MD

## 2022-02-04 NOTE — Telephone Encounter (Signed)
Pt called answering service. Started the keflex and flagyl yesterday. Had increased pain and lethargy today, could not really get out of bed. Hurts to move around. Has been monitoring temperature and it has increased to 100.1. Has been taking tylenol, ibuprofen and tramadol. Also has oxycodone left but has not taken any more. Has noticed more bleeding with wiping, quarter sized amount, brighter red than before. But has less discharge from the incision today. Results of wound culture have not returned yet but will call lab for preliminary in the morning. She feels she has enough pain medication available for tonight/ tomorrow. Will call her again in AM to check in.  If symptoms worsen, may need to go to hospital for imaging.   [name], MD

## 2022-02-05 NOTE — Telephone Encounter (Signed)
Followed up with patient this morning. She states she was able to have a bowel movement after two doses of Miralax and stool softener and the pain has gotten a lot better. Still having some bleeding with wiping but has not been more. Temperature has not increased. Culture is showing gram positive cocci and gram negative rods so will continue with current antibiotics, and will call her with final culture results.   [name], MD

## 2022-02-06 LAB — WOUND CULTURE

## 2022-02-10 ENCOUNTER — Ambulatory Visit (INDEPENDENT_AMBULATORY_CARE_PROVIDER_SITE_OTHER): Payer: BC Managed Care – PPO | Admitting: Obstetrics and Gynecology

## 2022-02-10 ENCOUNTER — Encounter: Payer: Self-pay | Admitting: Obstetrics and Gynecology

## 2022-02-10 ENCOUNTER — Other Ambulatory Visit (HOSPITAL_COMMUNITY)
Admission: RE | Admit: 2022-02-10 | Discharge: 2022-02-10 | Disposition: A | Discharge status: Home / Self Care | Payer: BC Managed Care – PPO | Attending: Obstetrics and Gynecology | Admitting: Obstetrics and Gynecology

## 2022-02-10 VITALS — BP 118/75 | HR 72

## 2022-02-10 DIAGNOSIS — T8149XA Infection following a procedure, other surgical site, initial encounter: Secondary | ICD-10-CM | POA: Diagnosis present

## 2022-02-10 DIAGNOSIS — Y848 Other medical procedures as the cause of abnormal reaction of the patient, or of later complication, without mention of misadventure at the time of the procedure: Secondary | ICD-10-CM | POA: Insufficient documentation

## 2022-02-10 NOTE — Progress Notes (Signed)
[facility]  Date of Visit: 02/10/2022  History of Present Illness: Ms. Gendron is a 51 y.o. female scheduled today for a post-operative visit.   Surgery: s/p Posterior repair, sacrospinous ligament fixation, perineorrhaphy, anal sphincteroplasty on 01/26/22  She passed her postoperative void trial.   Still having some bleeding, fills part of a pad in about half a day. Blood is dark red, no clots. Still having a moderate amount of discharge. Pain has been better controlled. She is taking ibuprofen, tylenol and gabapentin. Has not needed anything stronger. Has been trying to walk/ move around more. She is taking the keflex and flagyl as prescribed.   Wound culture:  Organism ID, Bacteria Comment   Comment: Many white blood cells.  Organism ID, Bacteria Comment   Comment: Many gram positive cocci.  Organism ID, Bacteria Comment   Comment: Many gram negative rods.  Aerobic Bacterial Culture Final report   Organism ID, Bacteria Comment   Comment: Mixed skin flora including multiple gram negative rods.  Heavy growth      Medications: She has a current medication list which includes the following prescription(s): acetaminophen, bystolic, cephalexin, cetirizine, clobetasol ointment, cyclobenzaprine, gabapentin, ibuprofen, levothyroxine, metronidazole, pantoprazole, polyethylene glycol 3350, polyethylene glycol powder, tamoxifen, triamcinolone, wegovy, estradiol, and metronidazole.   Allergies: Patient is allergic to codeine, hyaluronic acid [sodium hyaluronate], hydrocodone, other, and latex.   Physical Exam: BP 118/75   Pulse 72    Perineal Incision: Appears similar to a week ago- perineal incision disrupted with copious yellow discharge- approximately 2cm, with 1 cm depth. Culture obtained. No erythema on skin Pelvic Examination: Vagina: Incisions healing well. Sutures are present at incision line and apex and intact with a small amount of dark blood present. ? Hematoma-  fluctuant area near apex.   POP-Q: deferred ---------------------------------------------------------  Assessment and Plan:  1. Wound infection after surgery     - Culture was non- specific. Repeated today to ensure there is not an organism not being treated by her current antibiotic regimen. Continue with keflex '250mg'$  QID x 14 days and flagyl '500mg'$  BID for another week.  - Will have her return in a week to reexamine. If bleeding is stable can continue to watch.   Return 1 week or sooner if needed  [name], MD

## 2022-02-13 ENCOUNTER — Ambulatory Visit (INDEPENDENT_AMBULATORY_CARE_PROVIDER_SITE_OTHER): Payer: BC Managed Care – PPO | Admitting: Obstetrics and Gynecology

## 2022-02-13 ENCOUNTER — Encounter: Payer: Self-pay | Admitting: Obstetrics and Gynecology

## 2022-02-13 VITALS — BP 141/92 | HR 80

## 2022-02-13 DIAGNOSIS — N939 Abnormal uterine and vaginal bleeding, unspecified: Secondary | ICD-10-CM

## 2022-02-13 LAB — AEROBIC CULTURE W GRAM STAIN (SUPERFICIAL SPECIMEN)

## 2022-02-13 NOTE — Progress Notes (Signed)
[facility]  Date of Visit: 02/13/2022  History of Present Illness: [name] is a 51 y.o. female scheduled today for a post-operative visit.   Surgery: s/p Posterior repair, sacrospinous ligament fixation, perineorrhaphy, anal sphincteroplasty on 01/26/22  She passed her postoperative void trial.   Has had a few gushes of blood today, coming from higher up in the vagina. Filled most of the pad. Had another gush on the way into the office. Blood is mostly dark but has seen some brighter red.   Wound culture 02/10/22: few E.coli and few klebsiella oxytoca   Medications: She has a current medication list which includes the following prescription(s): acetaminophen, bystolic, cephalexin, cetirizine, clobetasol ointment, cyclobenzaprine, estradiol, gabapentin, ibuprofen, levothyroxine, metronidazole, metronidazole, pantoprazole, polyethylene glycol 3350, polyethylene glycol powder, tamoxifen, triamcinolone, and wegovy.   Allergies: Patient is allergic to codeine, hyaluronic acid [sodium hyaluronate], hydrocodone, other, and latex.   Physical Exam: BP (!) 141/92   Pulse 80    Perineal Incision: Appears similar to a week ago- perineal incision disrupted with small amount of yellow discharge- no blood present. No erythema on skin Pelvic Examination: Vagina:  Sutures are present at incision line and apex and intact with dark blood present. possible Hematoma- fluctuant area near apex. Silver nitrate applied to apex of incision. No active bleeding noted.   POP-Q: deferred ---------------------------------------------------------  Assessment and Plan:  1. Vaginal bleeding     - Few bacteria noted on perineal culture, susceptible to current regimen. Continue with keflex '250mg'$  QID x 14 days and flagyl '500mg'$  BID until complete.  - Bleeding treated with silver nitrate. If small hematoma is present, then likely will have some more bleeding. If it is consistently filling > 1 pad an hour  then she will need to call and notify person on call.  - Has appt Tuesday and will plan to repeat exam at that time.     [name], MD

## 2022-02-16 ENCOUNTER — Telehealth: Payer: Self-pay | Admitting: Obstetrics and Gynecology

## 2022-02-16 NOTE — Telephone Encounter (Signed)
Pt called to update that she is continuing to have vaginal bleeding. Amount depends on how active she is. Has dark blood and fills about 3 pads over the day. Pain overall has been controlled. Will continue to monitor and has appt tomorrow. We discussed possibly needing to go to the OR to find source of bleeding- will look at schedule this week. She will update if bleeding worsens.   [name], MD

## 2022-02-17 ENCOUNTER — Ambulatory Visit (INDEPENDENT_AMBULATORY_CARE_PROVIDER_SITE_OTHER): Payer: BC Managed Care – PPO | Admitting: Obstetrics and Gynecology

## 2022-02-17 ENCOUNTER — Encounter: Payer: Self-pay | Admitting: Obstetrics and Gynecology

## 2022-02-17 VITALS — BP 135/88 | HR 80

## 2022-02-17 DIAGNOSIS — N939 Abnormal uterine and vaginal bleeding, unspecified: Secondary | ICD-10-CM

## 2022-02-17 NOTE — Progress Notes (Signed)
[facility]  Date of Visit: 02/17/2022  History of Present Illness: Ms. Dicarlo is a 51 y.o. female scheduled today for a post-operative visit.   Surgery: s/p Posterior repair, sacrospinous ligament fixation, perineorrhaphy, anal sphincteroplasty on 01/26/22  She passed her postoperative void trial.   Went through about 5 pads yesterday, saturated. Pain in perineal area has improved.  Completed her antibiotics yesterday for the perineal wound infection.    Medications: She has a current medication list which includes the following prescription(s): acetaminophen, bystolic, cephalexin, cetirizine, clobetasol ointment, cyclobenzaprine, estradiol, gabapentin, ibuprofen, levothyroxine, metronidazole, metronidazole, pantoprazole, polyethylene glycol 3350, polyethylene glycol powder, tamoxifen, triamcinolone, and wegovy.   Allergies: Patient is allergic to codeine, hyaluronic acid [sodium hyaluronate], hydrocodone, other, and latex.   Physical Exam: BP 135/88   Pulse 80    Perineal Incision: perineal incision disrupted with small amount of yellow discharge- no blood present. No erythema on skin. Depth of incision is less- approximately 1cm.  Pelvic Examination: Vagina:  Sutures are present at incision line and apex - clot of blood present at the apex with fluctuant area near apex. No visible active bleeding when clot is cleared.   POP-Q: deferred ---------------------------------------------------------  Assessment and Plan:  1. Vaginal bleeding      - Completed keflex '250mg'$  QID x 14 days and flagyl '500mg'$  BID for perineal wound infection - Attempted to place vaginal packing today but patient unable to tolerate.  - Will plan to go to OR for exam under anesthesia and revision perineoplasty due to continued bleeding.     [name], MD

## 2022-02-18 ENCOUNTER — Other Ambulatory Visit: Payer: Self-pay

## 2022-02-18 ENCOUNTER — Encounter (HOSPITAL_BASED_OUTPATIENT_CLINIC_OR_DEPARTMENT_OTHER): Payer: Self-pay | Admitting: Obstetrics and Gynecology

## 2022-02-18 NOTE — Progress Notes (Signed)
Spoke w/ via phone for pre-op interview---Sandy Goodwin needs dos---- cbc             Goodwin results------01/26/22 cbc in [place] test -----patient states asymptomatic no test needed Arrive at -------0530 on 02/19/22 NPO after MN NO Solid Food.  Clear liquids from MN until---0430 Med rec completed Medications to take morning of surgery -----Gabapentin Diabetic medication -----Per pt, she has been holding Wegovy since before her last surgery on 01/26/22. Patient instructed no nail polish to be worn day of surgery Patient instructed to bring photo id and insurance card day of surgery Patient aware to have Driver (ride ) / caregiver    for 24 hours after surgery - husband, Event organiser Patient Special Instructions -----none Pre-Op special Istructions -----none Patient verbalized understanding of instructions that were given at this phone interview. Patient denies shortness of breath, chest pain, fever, cough at this phone interview.  Patient's history was updated by Jeral Fruit, RN on 01/15/22. Patient states no changes in medical history or medications since that time.

## 2022-02-19 ENCOUNTER — Encounter (HOSPITAL_BASED_OUTPATIENT_CLINIC_OR_DEPARTMENT_OTHER): Payer: Self-pay | Admitting: Obstetrics and Gynecology

## 2022-02-19 ENCOUNTER — Ambulatory Visit (HOSPITAL_BASED_OUTPATIENT_CLINIC_OR_DEPARTMENT_OTHER): Payer: BC Managed Care – PPO | Admitting: Anesthesiology

## 2022-02-19 ENCOUNTER — Other Ambulatory Visit: Payer: Self-pay

## 2022-02-19 ENCOUNTER — Encounter (HOSPITAL_BASED_OUTPATIENT_CLINIC_OR_DEPARTMENT_OTHER)
Admission: RE | Disposition: A | Discharge status: Home / Self Care | Payer: Self-pay | Source: Home / Self Care | Attending: Obstetrics and Gynecology

## 2022-02-19 ENCOUNTER — Ambulatory Visit (HOSPITAL_BASED_OUTPATIENT_CLINIC_OR_DEPARTMENT_OTHER)
Admission: RE | Admit: 2022-02-19 | Discharge: 2022-02-19 | Disposition: A | Discharge status: Home / Self Care | Payer: BC Managed Care – PPO | Attending: Obstetrics and Gynecology | Admitting: Obstetrics and Gynecology

## 2022-02-19 DIAGNOSIS — Y838 Other surgical procedures as the cause of abnormal reaction of the patient, or of later complication, without mention of misadventure at the time of the procedure: Secondary | ICD-10-CM | POA: Diagnosis not present

## 2022-02-19 DIAGNOSIS — T8131XA Disruption of external operation (surgical) wound, not elsewhere classified, initial encounter: Secondary | ICD-10-CM | POA: Insufficient documentation

## 2022-02-19 DIAGNOSIS — Z01818 Encounter for other preprocedural examination: Secondary | ICD-10-CM

## 2022-02-19 DIAGNOSIS — Z9889 Other specified postprocedural states: Secondary | ICD-10-CM | POA: Insufficient documentation

## 2022-02-19 DIAGNOSIS — N939 Abnormal uterine and vaginal bleeding, unspecified: Secondary | ICD-10-CM | POA: Insufficient documentation

## 2022-02-19 HISTORY — PX: PERINEOPLASTY: SHX2218

## 2022-02-19 LAB — CBC
HCT: 33.4 % — ABNORMAL LOW (ref 36.0–46.0)
Hemoglobin: 10.2 g/dL — ABNORMAL LOW (ref 12.0–15.0)
MCH: 27.8 pg (ref 26.0–34.0)
MCHC: 30.5 g/dL (ref 30.0–36.0)
MCV: 91 fL (ref 80.0–100.0)
Platelets: 376 10*3/uL (ref 150–400)
RBC: 3.67 MIL/uL — ABNORMAL LOW (ref 3.87–5.11)
RDW: 13.6 % (ref 11.5–15.5)
WBC: 4.7 10*3/uL (ref 4.0–10.5)
nRBC: 0 % (ref 0.0–0.2)

## 2022-02-19 SURGERY — EXAM UNDER ANESTHESIA
Anesthesia: General | Site: Perineum

## 2022-02-19 MED ORDER — DEXAMETHASONE SODIUM PHOSPHATE 10 MG/ML IJ SOLN
INTRAMUSCULAR | Status: AC
  Filled 2022-02-19: qty 1

## 2022-02-19 MED ORDER — EPHEDRINE SULFATE-NACL 50-0.9 MG/10ML-% IV SOSY
PREFILLED_SYRINGE | INTRAVENOUS | Status: DC | PRN
  Administered 2022-02-19 (×2): 10 mg via INTRAVENOUS

## 2022-02-19 MED ORDER — PHENYLEPHRINE 80 MCG/ML (10ML) SYRINGE FOR IV PUSH (FOR BLOOD PRESSURE SUPPORT)
PREFILLED_SYRINGE | INTRAVENOUS | Status: AC
  Filled 2022-02-19: qty 10

## 2022-02-19 MED ORDER — PHENYLEPHRINE 80 MCG/ML (10ML) SYRINGE FOR IV PUSH (FOR BLOOD PRESSURE SUPPORT)
PREFILLED_SYRINGE | INTRAVENOUS | Status: DC | PRN
  Administered 2022-02-19 (×2): 160 ug via INTRAVENOUS
  Administered 2022-02-19: 80 ug via INTRAVENOUS

## 2022-02-19 MED ORDER — LACTATED RINGERS IV SOLN
INTRAVENOUS | Status: DC
  Administered 2022-02-19: 1000 mL via INTRAVENOUS

## 2022-02-19 MED ORDER — EPHEDRINE 5 MG/ML INJ
INTRAVENOUS | Status: AC
  Filled 2022-02-19: qty 5

## 2022-02-19 MED ORDER — PROPOFOL 10 MG/ML IV BOLUS
INTRAVENOUS | Status: AC
  Filled 2022-02-19: qty 20

## 2022-02-19 MED ORDER — PHENYLEPHRINE HCL-NACL 20-0.9 MG/250ML-% IV SOLN
INTRAVENOUS | Status: DC | PRN
  Administered 2022-02-19: 80 ug/min via INTRAVENOUS

## 2022-02-19 MED ORDER — CEFAZOLIN SODIUM-DEXTROSE 2-4 GM/100ML-% IV SOLN
INTRAVENOUS | Status: AC
  Filled 2022-02-19: qty 100

## 2022-02-19 MED ORDER — LIDOCAINE 2% (20 MG/ML) 5 ML SYRINGE
INTRAMUSCULAR | Status: DC | PRN
  Administered 2022-02-19: 60 mg via INTRAVENOUS

## 2022-02-19 MED ORDER — FENTANYL CITRATE (PF) 100 MCG/2ML IJ SOLN
INTRAMUSCULAR | Status: AC
  Filled 2022-02-19: qty 2

## 2022-02-19 MED ORDER — PHENYLEPHRINE 80 MCG/ML (10ML) SYRINGE FOR IV PUSH (FOR BLOOD PRESSURE SUPPORT)
PREFILLED_SYRINGE | INTRAVENOUS | Status: AC
  Filled 2022-02-19: qty 20

## 2022-02-19 MED ORDER — ACETAMINOPHEN 500 MG PO TABS
ORAL_TABLET | ORAL | Status: AC
  Filled 2022-02-19: qty 2

## 2022-02-19 MED ORDER — GABAPENTIN 300 MG PO CAPS: 300.0000 mg | ORAL_CAPSULE | ORAL | Status: DC

## 2022-02-19 MED ORDER — ONDANSETRON HCL 4 MG/2ML IJ SOLN
INTRAMUSCULAR | Status: AC
  Filled 2022-02-19: qty 2

## 2022-02-19 MED ORDER — ARTIFICIAL TEARS OPHTHALMIC OINT
TOPICAL_OINTMENT | OPHTHALMIC | Status: AC
  Filled 2022-02-19: qty 3.5

## 2022-02-19 MED ORDER — MIDAZOLAM HCL 2 MG/2ML IJ SOLN
INTRAMUSCULAR | Status: AC
  Filled 2022-02-19: qty 2

## 2022-02-19 MED ORDER — HYDROMORPHONE HCL 2 MG/ML IJ SOLN
INTRAMUSCULAR | Status: AC
  Filled 2022-02-19: qty 1

## 2022-02-19 MED ORDER — TRAMADOL HCL 50 MG PO TABS
ORAL_TABLET | ORAL | Status: AC
  Filled 2022-02-19: qty 1

## 2022-02-19 MED ORDER — MIDAZOLAM HCL 2 MG/2ML IJ SOLN
INTRAMUSCULAR | Status: DC | PRN
  Administered 2022-02-19: 2 mg via INTRAVENOUS

## 2022-02-19 MED ORDER — TRAMADOL HCL 50 MG PO TABS: 50.0000 mg | ORAL_TABLET | Freq: Four times a day (QID) | ORAL | 0 refills | Status: DC | PRN

## 2022-02-19 MED ORDER — PROPOFOL 10 MG/ML IV BOLUS
INTRAVENOUS | Status: DC | PRN
  Administered 2022-02-19: 160 mg via INTRAVENOUS
  Administered 2022-02-19: 20 mg via INTRAVENOUS

## 2022-02-19 MED ORDER — DEXMEDETOMIDINE HCL IN NACL 400 MCG/100ML IV SOLN
INTRAVENOUS | Status: DC | PRN
  Administered 2022-02-19 (×3): 4 ug via INTRAVENOUS

## 2022-02-19 MED ORDER — KETOROLAC TROMETHAMINE 30 MG/ML IJ SOLN
INTRAMUSCULAR | Status: AC
  Filled 2022-02-19: qty 1

## 2022-02-19 MED ORDER — CEFAZOLIN SODIUM-DEXTROSE 2-4 GM/100ML-% IV SOLN
2.0000 g | INTRAVENOUS | Status: AC
  Administered 2022-02-19: 2 g via INTRAVENOUS

## 2022-02-19 MED ORDER — GLYCOPYRROLATE PF 0.2 MG/ML IJ SOSY
PREFILLED_SYRINGE | INTRAMUSCULAR | Status: DC | PRN
  Administered 2022-02-19: .2 mg via INTRAVENOUS

## 2022-02-19 MED ORDER — POVIDONE-IODINE 10 % EX SWAB: 2.0000 | Freq: Once | CUTANEOUS | Status: DC

## 2022-02-19 MED ORDER — TRAMADOL HCL 50 MG PO TABS
50.0000 mg | ORAL_TABLET | Freq: Once | ORAL | Status: AC
  Administered 2022-02-19: 50 mg via ORAL

## 2022-02-19 MED ORDER — FENTANYL CITRATE (PF) 100 MCG/2ML IJ SOLN
INTRAMUSCULAR | Status: DC | PRN
  Administered 2022-02-19 (×2): 50 ug via INTRAVENOUS

## 2022-02-19 MED ORDER — GABAPENTIN 300 MG PO CAPS
ORAL_CAPSULE | ORAL | Status: AC
  Filled 2022-02-19: qty 1

## 2022-02-19 MED ORDER — HYDROMORPHONE HCL 1 MG/ML IJ SOLN
INTRAMUSCULAR | Status: DC | PRN
  Administered 2022-02-19 (×4): .5 mg via INTRAVENOUS

## 2022-02-19 MED ORDER — FENTANYL CITRATE (PF) 100 MCG/2ML IJ SOLN
25.0000 ug | INTRAMUSCULAR | Status: DC | PRN
  Administered 2022-02-19: 25 ug via INTRAVENOUS

## 2022-02-19 MED ORDER — DEXMEDETOMIDINE HCL IN NACL 80 MCG/20ML IV SOLN
INTRAVENOUS | Status: AC
  Filled 2022-02-19: qty 20

## 2022-02-19 MED ORDER — LIDOCAINE-EPINEPHRINE 1 %-1:100000 IJ SOLN
INTRAMUSCULAR | Status: DC | PRN
  Administered 2022-02-19: 10 mL

## 2022-02-19 MED ORDER — LIDOCAINE-EPINEPHRINE 1 %-1:100000 IJ SOLN
INTRAMUSCULAR | Status: AC
  Filled 2022-02-19: qty 1

## 2022-02-19 MED ORDER — LIDOCAINE HCL (PF) 2 % IJ SOLN
INTRAMUSCULAR | Status: AC
  Filled 2022-02-19: qty 10

## 2022-02-19 MED ORDER — ACETAMINOPHEN 500 MG PO TABS
1000.0000 mg | ORAL_TABLET | ORAL | Status: AC
  Administered 2022-02-19: 1000 mg via ORAL

## 2022-02-19 MED ORDER — ONDANSETRON HCL 4 MG/2ML IJ SOLN
INTRAMUSCULAR | Status: DC | PRN
  Administered 2022-02-19: 4 mg via INTRAVENOUS

## 2022-02-19 MED ORDER — DEXAMETHASONE SODIUM PHOSPHATE 10 MG/ML IJ SOLN
INTRAMUSCULAR | Status: DC | PRN
  Administered 2022-02-19: 8 mg via INTRAVENOUS

## 2022-02-19 SURGICAL SUPPLY — 32 items
BLADE SURG 15 STRL LF DISP TIS (BLADE) ×1 IMPLANT
BLADE SURG 15 STRL SS (BLADE) ×1
GAUZE 4X4 16PLY ~~LOC~~+RFID DBL (SPONGE) ×1 IMPLANT
GLOVE BIO SURGEON STRL SZ 6 (GLOVE) ×1 IMPLANT
GLOVE BIOGEL PI IND STRL 6.5 (GLOVE) ×1 IMPLANT
GLOVE BIOGEL PI IND STRL 7.0 (GLOVE) ×1 IMPLANT
GOWN STRL REUS W/TWL LRG LVL3 (GOWN DISPOSABLE) ×1 IMPLANT
HIBICLENS CHG 4% 4OZ BTL (MISCELLANEOUS) ×1 IMPLANT
KIT TURNOVER CYSTO (KITS) ×1 IMPLANT
NDL SPNL 22GX7 QUINCKE BK (NEEDLE) IMPLANT
NEEDLE HYPO 22GX1.5 SAFETY (NEEDLE) ×1 IMPLANT
NEEDLE SPNL 22GX7 QUINCKE BK (NEEDLE) IMPLANT
NS IRRIG 1000ML POUR BTL (IV SOLUTION) ×1 IMPLANT
PACK VAGINAL MINOR WOMEN LF (CUSTOM PROCEDURE TRAY) ×1 IMPLANT
PACK VAGINAL WOMENS (CUSTOM PROCEDURE TRAY) ×1 IMPLANT
PACKING VAGINAL (PACKING) IMPLANT
RETRACTOR LONE STAR DISPOSABLE (INSTRUMENTS) IMPLANT
RETRACTOR STAY HOOK 5MM (MISCELLANEOUS) IMPLANT
SPIKE FLUID TRANSFER (MISCELLANEOUS) IMPLANT
SUCTION FRAZIER HANDLE 10FR (MISCELLANEOUS)
SUCTION TUBE FRAZIER 10FR DISP (MISCELLANEOUS) ×1 IMPLANT
SUT MON AB 2-0 SH 27 (SUTURE) IMPLANT
SUT VIC AB 0 CT1 27 (SUTURE) ×1
SUT VIC AB 0 CT1 27XBRD ANTBC (SUTURE) IMPLANT
SUT VIC AB 2-0 SH 27 (SUTURE) ×1
SUT VIC AB 2-0 SH 27XBRD (SUTURE) IMPLANT
SUT VIC AB 3-0 SH 18 (SUTURE) IMPLANT
SUT VICRYL 2-0 SH 8X27 (SUTURE) IMPLANT
SYR BULB EAR ULCER 3OZ GRN STR (SYRINGE) ×1 IMPLANT
TOWEL OR 17X26 10 PK STRL BLUE (TOWEL DISPOSABLE) ×1 IMPLANT
TRAY FOL W/BAG SLVR 16FR STRL (SET/KITS/TRAYS/PACK) IMPLANT
TRAY FOLEY W/BAG SLVR 16FR LF (SET/KITS/TRAYS/PACK) ×1

## 2022-02-19 NOTE — Transfer of Care (Signed)
Immediate Anesthesia Transfer of Care Note  Patient: Sandy Goodwin  Procedure(s) Performed: Procedure(s) (LRB): EXAM UNDER ANESTHESIA (N/A) PERINEOPLASTY revision (N/A)  Patient Location: PACU  Anesthesia Type: General  Level of Consciousness: awake, alert  and oriented  Airway & Oxygen Therapy: Patient Spontanous Breathing and Patient connected to face mask oxygen  Post-op Assessment: Report given to PACU RN and Post -op Vital signs reviewed and stable  Post vital signs: Reviewed and stable  Complications: No apparent anesthesia complicationsImmediate Anesthesia Transfer of Care Note  Patient: [name]  Procedure(s) Performed: Procedure(s) (LRB): EXAM UNDER ANESTHESIA (N/A) PERINEOPLASTY revision (N/A)  Patient Location: PACU  Anesthesia Type: General  Level of Consciousness: awake, alert  and oriented  Airway & Oxygen Therapy: Patient Spontanous Breathing and Patient connected to face mask oxygen  Post-op Assessment: Report given to PACU RN and Post -op Vital signs reviewed and stable  Post vital signs: Reviewed and stable  Complications: No apparent anesthesia complications  Last Vitals:  Vitals Value Taken Time  BP 155/72 02/19/22 0930  Temp    Pulse 80 02/19/22 0933  Resp 14 02/19/22 0933  SpO2 100 % 02/19/22 0933  Vitals shown include unvalidated device data.  Last Pain:  Vitals:   02/19/22 0610  TempSrc: Oral  PainSc: 1       Patients Stated Pain Goal: 3 (09/32/67 1245)  Complications: No notable events documented.

## 2022-02-19 NOTE — Anesthesia Preprocedure Evaluation (Addendum)
Anesthesia Evaluation  Patient identified by MRN, date of birth, ID band Patient awake  General Assessment Comment:History noted Dr. Nyoka Cowden  Reviewed: Allergy & Precautions, NPO status   History of Anesthesia Complications (+) PONV and history of anesthetic complications  Airway Mallampati: II       Dental   Pulmonary neg pulmonary ROS   breath sounds clear to auscultation       Cardiovascular negative cardio ROS  Rhythm:Regular Rate:Normal     Neuro/Psych negative neurological ROS     GI/Hepatic negative GI ROS, Neg liver ROS,,,  Endo/Other  Hypothyroidism    Renal/GU negative Renal ROS     Musculoskeletal   Abdominal   Peds  Hematology   Anesthesia Other Findings   Reproductive/Obstetrics                             Anesthesia Physical Anesthesia Plan  ASA: 2  Anesthesia Plan: General   Post-op Pain Management: Tylenol PO (pre-op)*   Induction: Intravenous  PONV Risk Score and Plan: 4 or greater and Ondansetron, Dexamethasone and Midazolam  Airway Management Planned: LMA  Additional Equipment:   Intra-op Plan:   Post-operative Plan: Extubation in OR  Informed Consent: I have reviewed the patients History and Physical, chart, labs and discussed the procedure including the risks, benefits and alternatives for the proposed anesthesia with the patient or authorized representative who has indicated his/her understanding and acceptance.     Dental advisory given  Plan Discussed with: CRNA and Anesthesiologist  Anesthesia Plan Comments:        Anesthesia Quick Evaluation

## 2022-02-19 NOTE — Op Note (Signed)
Operative Note  Preoperative Diagnosis:  vaginal bleeding, perineal wound disruption.   Postoperative Diagnosis: same  Procedures performed:  Exam under anesthesia, revision perineoplasty  Implants: none  Attending Surgeon: Sherlene Shams, MD  Anesthesia: General LMA  Findings: 1. Raw tissue edges at vaginal apex, no specific site of bleeding or hematoma present.   2. Disrupted perineal body   Specimens: * No specimens in log *  Estimated blood loss: 30 mL  IV fluids: 1000 mL  Urine output: 8588 mL  Complications: none  Procedure in Detail:  After informed consent was obtained, the patient was taken to the operating room where anesthesia was induced and found to be adequate. She was placed in dorsal lithotomy position, taking care to avoid any traction on the extremities, and then prepped and draped in the usual sterile fashion. A self-retaining lonestar retractor was placed using four elastic blue stays.  After a foley catheter was inserted into the urethra.  Retractors were placed to visualize prior incision line. Raw, slightly separated suture edges were present at the vaginal apex. Slight bleeding of tissue edges were present. 1% lidocaine with epinephrine was injected. An incision was made with a 15 blade scalpel over the previous incision line. No hematoma was noted. Hemostasis was obtained with cautery. The incision was then closed with 2-0 vicryl in a running fashion. A small area of continued bleeding was noted and closed with an interrupted 2-0 vicryl. The vagina was irrigated and hemostasis was noted.    Attention was turned to the perineum.  This area was copiously irrigated. The incision edges were incised with metzenbaum scissors to create a fresh edge. The perineal body was closed with 0-vicryl stacked interrupted sutures. The subcutaneous skin was then closed with 2-0 vicryl. The perineal skin was then closed with interrupted 2-0 vicryl horizontal mattress sutures.  Hemostasis was noted.  Vaginal packing was placed, and foley catheter was kept in place.  A rectal examination was normal and confirmed no sutures within the rectum. The patient tolerated the procedure well.  She was awakened from anesthesia and transferred to the recovery room in stable condition. All counts were correct x 2.    [name], MD

## 2022-02-19 NOTE — Anesthesia Postprocedure Evaluation (Signed)
Anesthesia Post Note  Patient: Sandy Goodwin  Procedure(s) Performed: EXAM UNDER ANESTHESIA (Perineum) PERINEOPLASTY revision (Perineum)     Patient location during evaluation: PACU Anesthesia Type: General Level of consciousness: awake Pain management: pain level controlled Respiratory status: spontaneous breathing Cardiovascular status: stable Postop Assessment: no apparent nausea or vomiting Anesthetic complications: no   No notable events documented.  Last Vitals:  Vitals:   02/19/22 1000 02/19/22 1015  BP: (!) 149/78 (!) 161/78  Pulse: 77 81  Resp: 12 15  Temp: 36.6 C   SpO2: 100% 100%    Last Pain:  Vitals:   02/19/22 1000  TempSrc:   PainSc: 2                  Malayzia Laforte

## 2022-02-19 NOTE — H&P (Signed)
[facility] Pre-Operative H&P  Subjective Chief Complaint: Sandy Goodwin presents for a preoperative encounter.   History of Present Illness: Sandy Goodwin is a 51 y.o. female who presents for preoperative visit.  She is scheduled to undergo Exam under anesthesia, revision perineoplasty on 02/19/22.  She is s/p Posterior repair, sacrospinous ligament fixation, perineorrhaphy, anal sphincteroplasty on 01/26/22. She had disruption of her perineal incision due to wound infection and progressively worsening vaginal bleeding with unknown source.   Past Medical History:  Diagnosis Date   Full incontinence-feces    History of anemia    History of cancer chemotherapy    right breast;   neo-adjuvent 07-24-2020  to 11-15-2020  and 12-05-2020  to 07-24-2021   History of external beam radiation therapy    right reast 02-04-2021  to 03-25-2021   History of gastric ulcer    remote hx -- resolved   Hypothyroidism, postsurgical    followed by pcp;     02/ 2012  s/p  right hemithyroidectomy  (follicular adenoma)   Malignant neoplasm of upper-outer quadrant of right breast in female, estrogen receptor positive ([place]) 06/2020   oncologist--- dr Lindi Adie;  DCIS high grade right breast , neo-adjuvant chemo completed 11-15-2020,  s/p right breast lumpectomy w/ node dissection on 12-11-2020, then chemo completed 07-24-2021 and radiation completed 03-25-2021   Polyarthralgia    PONV (postoperative nausea and vomiting)    With first surgery, none since   Prolapse of vaginal vault after hysterectomy    and posterior prolapse   RA (rheumatoid arthritis) ([place])    rheumatologsit--- dr Amil Amen;  no treatment   Tachycardia    Tachycardia     Past Surgical History:  Procedure Laterality Date   ANTERIOR AND POSTERIOR REPAIR WITH SACROSPINOUS FIXATION N/A 01/26/2022   Procedure: POSTERIOR REPAIR WITH SACROSPINOUS FIXATION;  Surgeon: [name], MD;  Location: [facility];  Service:  Gynecology;  Laterality: N/A;  total time requested is 2 hours   APPENDECTOMY  08/2006   exploratory laparatomy w/ appendectomy (ruptured) and repair bowel   BREAST LUMPECTOMY WITH RADIOACTIVE SEED AND SENTINEL LYMPH NODE BIOPSY Right 12/11/2020   Procedure: RIGHT BREAST LUMPECTOMY WITH RADIOACTIVE SEED X2 AND RIGHT AXILLARY SENTINEL LYMPH NODE BIOPSY;  Surgeon: Rolm Bookbinder, MD;  Location: [place];  Service: General;  Laterality: Right;   BREAST RECONSTRUCTION Right 12/20/2020   Procedure: RIGHT ONCOPLASTIC RECONSTRUCTION;  Surgeon: Irene Limbo, MD;  Location: [place];  Service: Plastics;  Laterality: Right;   BREAST REDUCTION SURGERY Left 12/20/2020   Procedure: LEFT BREAST REDUCTION;  Surgeon: Irene Limbo, MD;  Location: [place];  Service: Plastics;  Laterality: Left;   COLONOSCOPY N/A 06/15/2016   Procedure: COLONOSCOPY;  Surgeon: Rogene Houston, MD;  [facility];  Service: Endoscopy;  Laterality: N/A;  [place]  06/03/2011   w/ suction for miscarriage   EXCISION OF BREAST BIOPSY Right 1999   INCISION AND DRAINAGE OF WOUND Right 01/10/2021   Procedure: INCISION AND DRAINAGE OF RIGHT AXILLA;  Surgeon: Irene Limbo, MD;  Location: [place];  Service: Plastics;  Laterality: Right;   OVUM / OOCYTE RETRIEVAL     2010  and 2012   PERINEOPLASTY N/A 01/26/2022   Procedure: PERINEORRHAPY;  Surgeon: [name], MD;  Location: [facility];  Service: Gynecology;  Laterality: N/A;   PORTACATH PLACEMENT Left 07/23/2020   Procedure: INSERTION  PORT-A-CATH;  Surgeon: Rolm Bookbinder, MD;  Location: [place];  Service: General;  Laterality: Left;   THYROID LOBECTOMY Right 05/2010   [facility]   VAGINAL HYSTERECTOMY  03/21/2019   @ [facility] by dr Barrie Dunker;   w/ Gaspar Garbe culdoplasty    is allergic to codeine, hyaluronic acid  [sodium hyaluronate], hydrocodone, other, latex, and wound dressing adhesive.   Family History  Problem Relation Age of Onset   Hypertension Mother    Breast cancer Mother    Lymphoma Mother    Hypertension Brother    Breast cancer Maternal Aunt    Lymphoma Maternal Grandfather    Breast cancer Maternal Aunt     Social History   Tobacco Use   Smoking status: Never   Smokeless tobacco: Never  Vaping Use   Vaping Use: Never used  Substance Use Topics   Alcohol use: Not Currently    Comment: rare   Drug use: Never     Review of Systems was negative for a full 10 system review except as noted in the History of Present Illness.   Current Facility-Administered Medications:    ceFAZolin (ANCEF) IVPB 2g/100 mL premix, 2 g, Intravenous, On Call to OR, [name], MD   lactated ringers infusion, , Intravenous, Continuous, Effie Berkshire, MD, Last Rate: 50 mL/hr at 02/19/22 0622, 1,000 mL at 02/19/22 0622   povidone-iodine 10 % swab 2 Application, 2 Application, Topical, Once, [name], MD   Objective Vitals:   02/19/22 0610  BP: 125/85  Pulse: 74  Resp: 16  Temp: 98.1 F (36.7 C)  SpO2: 98%    Gen: NAD CV: S1 S2 RRR Lungs: Clear to auscultation bilaterally Abd: soft, nontender  Lab Results  Component Value Date   WBC 4.7 02/19/2022   HGB 10.2 (L) 02/19/2022   HCT 33.4 (L) 02/19/2022   MCV 91.0 02/19/2022   PLT 376 02/19/2022      Assessment/ Plan  The patient is a 51 y.o. year old scheduled to undergo Exam under anesthesia, revision perineoplasty.  Plan: General Surgical Consent: The patient has previously been counseled on alternative treatments, and the decision by the patient and provider was to proceed with the procedure listed above.  For all procedures, there are risks of bleeding, infection, damage to surrounding organs including but not limited to bowel, bladder, blood vessels, ureters and nerves, and need for further  surgery if an injury were to occur. These risks are all low with minimally invasive surgery.   There are risks of numbness and weakness at any body site or buttock/rectal pain.  It is possible that baseline pain can be worsened by surgery, either with or without mesh. If surgery is vaginal, there is also a low risk of possible conversion to laparoscopy or open abdominal incision where indicated. Very rare risks include blood transfusion, blood clot, heart attack, pneumonia, or death.   There is also a risk of short-term postoperative urinary retention with need to use a catheter. About half of patients need to go home from surgery with a catheter, which is then later removed in the office. The risk of long-term need for a catheter is very low. There is also a risk of worsening of overactive bladder.    [name], MD

## 2022-02-19 NOTE — Anesthesia Procedure Notes (Signed)
Procedure Name: LMA Insertion Date/Time: 02/19/2022 8:11 AM  Performed by: Mechele Claude, CRNAPre-anesthesia Checklist: Patient identified, Emergency Drugs available, Suction available and Patient being monitored Patient Re-evaluated:Patient Re-evaluated prior to induction Oxygen Delivery Method: Circle system utilized Preoxygenation: Pre-oxygenation with 100% oxygen Induction Type: IV induction Ventilation: Mask ventilation without difficulty LMA: LMA inserted LMA Size: 4.0 Number of attempts: 1 Airway Equipment and Method: Bite block Placement Confirmation: positive ETCO2 Tube secured with: Tape Dental Injury: Teeth and Oropharynx as per pre-operative assessment

## 2022-02-19 NOTE — Discharge Instructions (Addendum)
Leave packing and foley in place. Return to office at 1:40pm tomorrow to have it removed.   Take medications as directed. Tramadol has been sent to your pharmacy.   Call for increased pain or bleeding.     No acetaminophen/Tylenol until after 12:30pm today if needed for pain.      Post Anesthesia Home Care Instructions  Activity: Get plenty of rest for the remainder of the day. A responsible individual must stay with you for 24 hours following the procedure.  For the next 24 hours, DO NOT: -Drive a car -Paediatric nurse -Drink alcoholic beverages -Take any medication unless instructed by your physician -Make any legal decisions or sign important papers.  Meals: Start with liquid foods such as gelatin or soup. Progress to regular foods as tolerated. Avoid greasy, spicy, heavy foods. If nausea and/or vomiting occur, drink only clear liquids until the nausea and/or vomiting subsides. Call your physician if vomiting continues.  Special Instructions/Symptoms: Your throat may feel dry or sore from the anesthesia or the breathing tube placed in your throat during surgery. If this causes discomfort, gargle with warm salt water. The discomfort should disappear within 24 hours.

## 2022-02-20 ENCOUNTER — Ambulatory Visit (INDEPENDENT_AMBULATORY_CARE_PROVIDER_SITE_OTHER): Payer: BC Managed Care – PPO | Admitting: Obstetrics and Gynecology

## 2022-02-20 ENCOUNTER — Encounter: Payer: Self-pay | Admitting: Obstetrics and Gynecology

## 2022-02-20 VITALS — BP 124/88 | HR 71

## 2022-02-20 DIAGNOSIS — Z9889 Other specified postprocedural states: Secondary | ICD-10-CM

## 2022-02-20 NOTE — Progress Notes (Signed)
[facility]  Date of Visit: 02/20/2022  History of Present Illness: Sandy Goodwin is a 52 y.o. female scheduled today for a post-operative visit.   Surgery: s/p Posterior repair, sacrospinous ligament fixation, perineorrhaphy, anal sphincteroplasty on 01/26/22 S/p EUA and revision perineoplasty on 02/21/22.   Had surgery yesterday. Vaginal packing was placed with foley catheter and she returns today for removal. We discussed that there was not any specific areas of bleeding noted during the surgery yesterday but some slight separation/ raw tissue at the vaginal apex incision.    Medications: She has a current medication list which includes the following prescription(s): acetaminophen, bystolic, cetirizine, clobetasol ointment, cyclobenzaprine, estradiol, gabapentin, ibuprofen, levothyroxine, metronidazole, pantoprazole, polyethylene glycol 3350, polyethylene glycol powder, tamoxifen, tramadol, triamcinolone, and wegovy.   Allergies: Patient is allergic to codeine, hyaluronic acid [sodium hyaluronate], hydrocodone, other, latex, and wound dressing adhesive.   Physical Exam: BP 124/88   Pulse 71    Perineal Incision: intact, no bleeding present Vaginal packing removed- dark blood present. Foley catheter removed.  Split speculum exam performed and sutures appear intact, minimal blood present. Vaginal estrogen placed along the incision  POP-Q: deferred ---------------------------------------------------------  Assessment and Plan:  1. Post-operative state     - Can monitor light vaginal bleeding but notify if she is changing pad frequently or if she notices clots.  - use estrace cream nightly for the next two weeks- use finger to place pea sized amount.   Return 1 week for follow up  [name], MD

## 2022-02-25 ENCOUNTER — Encounter: Payer: Self-pay | Admitting: Obstetrics and Gynecology

## 2022-02-25 DIAGNOSIS — Z01818 Encounter for other preprocedural examination: Secondary | ICD-10-CM

## 2022-02-26 MED ORDER — GABAPENTIN 100 MG PO CAPS: 100.0000 mg | ORAL_CAPSULE | Freq: Three times a day (TID) | ORAL | 1 refills | Status: AC

## 2022-02-26 NOTE — Progress Notes (Signed)
[facility] Return Visit  SUBJECTIVE  History of Present Illness: Sandy Goodwin is a 51 y.o. female seen in follow-up for post op. Plan at last visit was to do vaginal estrogen cream to promote tissue healing and monitor for vaginal bleeding and infection.  Surgery: s/p Posterior repair, sacrospinous ligament fixation, perineorrhaphy, anal sphincteroplasty on 01/26/22 S/p EUA and revision perineoplasty on 02/21/22.   Patient reports she is still bleeding and changing pads but it seems to be getting better. Patient reports she is still tender but has been using the vaginal estrogen cream which helps some.    Past Medical History: Patient  has a past medical history of Full incontinence-feces, History of anemia, History of cancer chemotherapy, History of external beam radiation therapy, History of gastric ulcer, Hypothyroidism, postsurgical, Malignant neoplasm of upper-outer quadrant of right breast in female, estrogen receptor positive ([place]) (06/2020), Polyarthralgia, PONV (postoperative nausea and vomiting), Prolapse of vaginal vault after hysterectomy, RA (rheumatoid arthritis) ([place]), and Tachycardia.   Past Surgical History: She  has a past surgical history that includes Thyroid lobectomy (Right, 05/2010); Appendectomy (08/2006); Colonoscopy (N/A, 06/15/2016); Vaginal hysterectomy (03/21/2019); Portacath placement (Left, 07/23/2020); Breast lumpectomy with radioactive seed and sentinel lymph node biopsy (Right, 12/11/2020); Breast reconstruction (Right, 12/20/2020); Breast reduction surgery (Left, 12/20/2020); Incision and drainage of wound (Right, 01/10/2021); Dilation and curettage of uterus (06/03/2011); Ovum / oocyte retrieval; Excision of breast biopsy (Right, 1999); Anterior and posterior repair with sacrospinous fixation (N/A, 01/26/2022); Perineoplasty (N/A, 01/26/2022); and Perineoplasty (N/A, 02/19/2022).   Medications: She has a current medication list which includes the  following prescription(s): acetaminophen, bystolic, cetirizine, clobetasol ointment, cyclobenzaprine, estradiol, gabapentin, ibuprofen, levothyroxine, metronidazole, pantoprazole, polyethylene glycol 3350, polyethylene glycol powder, tamoxifen, tramadol, triamcinolone, and wegovy.   Allergies: Patient is allergic to codeine, hyaluronic acid [sodium hyaluronate], hydrocodone, other, latex, and wound dressing adhesive.   Social History: Patient  reports that she has never smoked. She has never used smokeless tobacco. She reports that she does not currently use alcohol. She reports that she does not use drugs.      OBJECTIVE     Physical Exam: There were no vitals filed for this visit. Gen: No apparent distress, A&O x 3.  Detailed Urogynecologic Evaluation:   Patient has moderate vaginal bleeding from suture site but the stitches are holding well. No obvious signs of vaginal infection.   She does have an area of stitch that has started to separate in the perineum area. No bleeding at the site, but there is a small amount of discharge that we will culture today, No obvious infection present. No purulent drainage able to be manually expressed from the area.       ASSESSMENT AND PLAN    Sandy Goodwin is a 51 y.o. with:  1. Post-operative state   2. Vaginal bleeding    Patient reports she is to start her hormone blockers (Tamoxifen) this coming week.  She was encouraged to use some of the estrogen cream externally, but she reports she has some itching when she attempts to do this so will do it as tolerated.  Will send culture for testing and if patient needs a course of antibiotics then will order at that time.   Patient to follow up 03/13/22 with Dr. Wannetta Sender.

## 2022-02-27 ENCOUNTER — Ambulatory Visit: Payer: BC Managed Care – PPO | Admitting: Obstetrics and Gynecology

## 2022-02-27 ENCOUNTER — Encounter: Payer: BC Managed Care – PPO | Admitting: Obstetrics and Gynecology

## 2022-03-02 ENCOUNTER — Encounter: Payer: Self-pay | Admitting: Obstetrics and Gynecology

## 2022-03-02 ENCOUNTER — Ambulatory Visit (INDEPENDENT_AMBULATORY_CARE_PROVIDER_SITE_OTHER): Payer: BC Managed Care – PPO | Admitting: Obstetrics and Gynecology

## 2022-03-02 ENCOUNTER — Other Ambulatory Visit (HOSPITAL_COMMUNITY)
Admission: RE | Admit: 2022-03-02 | Discharge: 2022-03-02 | Disposition: A | Discharge status: Home / Self Care | Payer: BC Managed Care – PPO | Attending: Obstetrics and Gynecology | Admitting: Obstetrics and Gynecology

## 2022-03-02 VITALS — BP 124/89 | HR 78

## 2022-03-02 DIAGNOSIS — N939 Abnormal uterine and vaginal bleeding, unspecified: Secondary | ICD-10-CM

## 2022-03-02 DIAGNOSIS — Z9889 Other specified postprocedural states: Secondary | ICD-10-CM

## 2022-03-02 NOTE — Addendum Note (Signed)
Addended by: Cindi Carbon on: 03/02/2022 08:22 PM   Modules accepted: Orders

## 2022-03-02 NOTE — Addendum Note (Signed)
Addended by: Alen Blew on: 03/02/2022 03:51 PM   Modules accepted: Orders

## 2022-03-02 NOTE — Addendum Note (Signed)
Addended by: Cindi Carbon on: 03/02/2022 08:24 PM   Modules accepted: Orders

## 2022-03-03 ENCOUNTER — Encounter: Payer: Self-pay | Admitting: *Deleted

## 2022-03-05 LAB — AEROBIC CULTURE W GRAM STAIN (SUPERFICIAL SPECIMEN)

## 2022-03-09 ENCOUNTER — Telehealth: Payer: Self-pay | Admitting: Obstetrics and Gynecology

## 2022-03-09 DIAGNOSIS — Z9889 Other specified postprocedural states: Secondary | ICD-10-CM

## 2022-03-09 MED ORDER — AMOXICILLIN 500 MG PO CAPS: 500.0000 mg | ORAL_CAPSULE | Freq: Two times a day (BID) | ORAL | 0 refills | Status: AC

## 2022-03-09 MED ORDER — SULFAMETHOXAZOLE-TRIMETHOPRIM 800-160 MG PO TABS: 1.0000 | ORAL_TABLET | Freq: Two times a day (BID) | ORAL | 0 refills | Status: AC

## 2022-03-09 NOTE — Telephone Encounter (Signed)
Conferred with Dr. Wannetta Sender regarding surgical site culture and she encouraged Bactrim DS x2 daily for 7 days and Amoxicillin '500mg'$  x2 daily for 7 day.   Discussed this with patient and medication sent to pharmacy. Patient is okay with this plan of care but reports she has historically had some problems with being able to stomach amoxicillin.  She reports her vaginal bleeding is still present but maybe has decreased a little. She states she has not noticed any separation of the sutures or worsening infection in the perineum. Reports she has continued to try and use the vaginal estrogen cream but that she has itching when it is applied externally.  Patient has a follow up this week on 12/1 and we will re-evaluate the surgical area.

## 2022-03-10 NOTE — Progress Notes (Signed)
HEMATOLOGY-ONCOLOGY TELEPHONE VISIT PROGRESS NOTE  I connected with our Sandy Goodwin on 03/12/22 at 10:15 AM EST by telephone and verified that I am speaking with the correct person using two identifiers.  I discussed the limitations, risks, security and privacy concerns of performing an evaluation and management service by telephone and the availability of in person appointments.  I also discussed with the Sandy Goodwin that there may be a Sandy Goodwin responsible charge related to this service. The Sandy Goodwin expressed understanding and agreed to proceed.   History of Present Illness:  Sandy Goodwin is a 51 y.o. with right breast cancer. Currently on tamoxifen. She presents to the clinic for a telephone follow-up to discuss the tolerance of tamoxifen.  The skin exfoliation changes in the face have resolved.  She is tolerating 20 mg tamoxifen extremely well.  She her major complaint is hot flashes which wake her up at night and disturbs her sleep.  Oncology History  Malignant neoplasm of upper-outer quadrant of right breast in female, estrogen receptor positive ([place])  03/23/2018 Genetic Testing   [facility] Genetic Testing Results: Negative, with a variant of uncertain significance in POLE.  Genes Analyzed: APC, ATM, AXIN2, BARD1, BMPR1A, BRCA1, BRCA2, BRIP1, CDH1, CDK4, CDKN2A, CHEK2, EPCAM (large rearrangement only), HOXB13 (sequencing only), GALNT12, MLH1, MSH2, MSH3, MSH6, MUTYH, NBN, NTHL1, PALB2, PMS2, PTEN, RAD51C, RAD51D, RNF43, RPS20, SMAD4, STK11, TP52. Sequencing was performed for select regions of POLE and POLD1, and large rearrangement analysis was performed for select regions of GERM1.   07/08/2020 Initial Diagnosis   Screening mammogram showed right breast calcifications. Diagnostic mammogram showed right breast calcifications spanning 4.1cm and no abnormal right axillary lymph nodes. 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    07/24/2020 - 11/15/2020 Neo-Adjuvant Chemotherapy   Taxotere, Carbo, Herceptin, Perjeta given every three weeks x 6   12/05/2020 - 07/24/2021 Chemotherapy   Maintenance Herceptin/Perjeta every three weeks for one year       12/11/2020 Surgery   Right breast lumpectomy [name]): Scattered foci of high-grade DCIS with calcifications, no invasive cancer, pathologic complete response, margins negative, 0/3 lymph nodes negative, previously ER 80%, PR 40%, HER2 positive, Ki-67 25%   12/11/2020 Cancer Staging   Staging form: Breast, AJCC 8th Edition - Pathologic stage from 12/11/2020: No Stage Recommended (ypT0, pN0, cM0) - Signed by Gardenia Phlegm, NP on 03/19/2021 Stage prefix: Post-therapy   12/20/2020 Surgery   Reconstruction with Dr. Iran Planas   02/04/2021 - 03/25/2021 Radiation Therapy   Adjuvant radiation   06/2021 -  Anti-estrogen oral therapy   Anastrozole daily     REVIEW OF SYSTEMS:   Constitutional: Denies fevers, chills or abnormal weight loss All other systems were reviewed with the Sandy Goodwin and are negative. Observations/Objective:     Assessment Plan:  Malignant neoplasm of upper-outer quadrant of right breast in female, estrogen receptor positive ([place]) 07/08/2020: Screening mammogram showed right breast calcifications. Diagnostic mammogram showed right breast calcifications spanning 4.1cm and no abnormal right axillary lymph nodes. 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 Genetics done in 2019: Negative   Treatment plan: 1. Neoadjuvant chemotherapy with TCH Perjeta 6 cycles completed 11/15/2020 followed by Herceptin Perjeta maintenance for 1 year 2. 12/11/2020:Right breast lumpectomy [name]): Scattered foci of high-grade DCIS with calcifications, no invasive cancer, pathologic complete response, margins negative, 0/3 lymph nodes negative, previously ER 80%, PR 40%, HER2  positive,  Ki-67 25% 3. Followed by adjuvant radiation therapy  started 02/04/2021 4.  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 URCC nausea study --------------------------------------------------------------------------------------------------------------------------------------- Profound intermittent rash with skin exfoliation on the face and puffiness of the face started March 2023 switched to exemestane and the rash came back.  Discontinued exemestane August 2023: The symptoms have resolved.   Recommendation:  Tamoxifen started 02/25/2022 initially 10 mg increased to 20 mg daily.   Breast Cancer Surveillance: 1. Breast MRI: 12/26/21: Benign 2. DEXA scan.12/18/21: T score -2.2  3. Mammograms:07/10/21: Benign  Tamoxifen toxicities: Hot flashes: wake her up But she is tolerating it fairly well. I will see the Sandy Goodwin back 1 week after her next breast MRI in September 2024.   I discussed the assessment and treatment plan with the Sandy Goodwin. The Sandy Goodwin was provided an opportunity to ask questions and all were answered. The Sandy Goodwin agreed with the plan and demonstrated an understanding of the instructions. The Sandy Goodwin was advised to call back or seek an in-person evaluation if the symptoms worsen or if the condition fails to improve as anticipated.   I provided 12 minutes of non-face-to-face time during this encounter.  This includes time for charting and coordination of care   Harriette Ohara, MD  I Gardiner Coins am scribing for Dr. Lindi Adie  I have reviewed the above documentation for accuracy and completeness, and I agree with the above.

## 2022-03-12 ENCOUNTER — Inpatient Hospital Stay: Payer: BC Managed Care – PPO | Attending: Hematology and Oncology | Admitting: Hematology and Oncology

## 2022-03-12 DIAGNOSIS — C50411 Malignant neoplasm of upper-outer quadrant of right female breast: Secondary | ICD-10-CM

## 2022-03-12 DIAGNOSIS — Z170 Estrogen receptor positive status [ER+]: Secondary | ICD-10-CM

## 2022-03-12 NOTE — Assessment & Plan Note (Signed)
07/08/2020: Screening mammogram showed right breast calcifications. Diagnostic mammogram showed right breast calcifications spanning 4.1cm and no abnormal right axillary lymph nodes. 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 Genetics done in 2019: Negative   Treatment plan: 1. Neoadjuvant chemotherapy with TCH Perjeta 6 cycles completed 11/15/2020 followed by Herceptin Perjeta maintenance for 1 year 2. 12/11/2020:Right breast lumpectomy [name]): Scattered foci of high-grade DCIS with calcifications, no invasive cancer, pathologic complete response, margins negative, 0/3 lymph nodes negative, previously ER 80%, PR 40%, HER2 positive, Ki-67 25% 3. Followed by adjuvant radiation therapy  started 02/04/2021 4.  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 URCC nausea study --------------------------------------------------------------------------------------------------------------------------------------- Profound intermittent rash with skin exfoliation on the face and puffiness of the face started March 2023 switched to exemestane and the rash came back.  Discontinued exemestane August 2023.   Recommendation:  Tamoxifen started 01/22/2022 initially 10 mg increased to 20 mg daily.   Breast Cancer Surveillance: 1. Breast MRI: 12/26/21: Benign 2. DEXA scan.12/18/21: T score -2.2  3. Mammograms:07/10/21: Benign  Tamoxifen toxicities:

## 2022-03-13 ENCOUNTER — Encounter: Payer: Self-pay | Admitting: Obstetrics and Gynecology

## 2022-03-13 ENCOUNTER — Ambulatory Visit (INDEPENDENT_AMBULATORY_CARE_PROVIDER_SITE_OTHER): Payer: BC Managed Care – PPO | Admitting: Obstetrics and Gynecology

## 2022-03-13 VITALS — BP 115/81 | HR 88

## 2022-03-13 DIAGNOSIS — Z9889 Other specified postprocedural states: Secondary | ICD-10-CM

## 2022-03-13 NOTE — Progress Notes (Signed)
[facility]  Date of Visit: 03/13/2022  History of Present Illness: Sandy Goodwin is a 51 y.o. female scheduled today for a post-operative visit.   Surgery: s/p Posterior repair, sacrospinous ligament fixation, perineorrhaphy, anal sphincteroplasty on 01/26/22 S/p EUA and revision perineoplasty on 02/21/22.   Currently on antibiotics- bactrim and amoxicillin due to positive wound culture last week. Has not noticed any discharge from the perineum. Continues to have vaginal bleeding but it has lessened. She is using the vaginal estrogen nightly.   She has started back at work (some virtual) and has been having some tailbone pain when she is on her feet for longer periods. Has not been using gabapentin because it makes her drowsy. Has flexeril but not using regularly.    Medications: She has a current medication list which includes the following prescription(s): acetaminophen, amoxicillin, bystolic, cetirizine, clobetasol ointment, cyclobenzaprine, estradiol, gabapentin, levothyroxine, metronidazole, pantoprazole, polyethylene glycol 3350, sulfamethoxazole-trimethoprim, tramadol, triamcinolone, wegovy, and tamoxifen.   Allergies: Patient is allergic to codeine, hyaluronic acid [sodium hyaluronate], hydrocodone, other, latex, and wound dressing adhesive.   Physical Exam: BP 115/81   Pulse 88    Perineal Incision: intact, no bleeding present Vaginal packing removed- dark blood present. Foley catheter removed.  Split speculum exam performed and sutures appear intact, minimal blood present. Vaginal estrogen placed along the incision  POP-Q: deferred ---------------------------------------------------------  Assessment and Plan:  1. Post-operative state     - Finish antibiotic course - Decrease vaginal estrogen to 3 times per week.  - For tailbone pain, try gabapentin and flexeril at night to avoid drowsiness during the day.  - Will continue to monitor bleeding  Return 3 weeks  or sooner if needed  [name], MD

## 2022-03-16 ENCOUNTER — Telehealth: Payer: Self-pay | Admitting: Hematology and Oncology

## 2022-03-16 NOTE — Telephone Encounter (Signed)
Scheduled appointment per 11/30 los. Patient is aware.

## 2022-03-26 ENCOUNTER — Encounter: Payer: Self-pay | Admitting: Hematology and Oncology

## 2022-03-26 ENCOUNTER — Other Ambulatory Visit: Payer: Self-pay | Admitting: *Deleted

## 2022-03-26 DIAGNOSIS — Z170 Estrogen receptor positive status [ER+]: Secondary | ICD-10-CM

## 2022-03-26 NOTE — Progress Notes (Signed)
Pt requesting Signatera testing.  Orders successfully faxed to [phone].

## 2022-04-03 ENCOUNTER — Encounter: Payer: Self-pay | Admitting: Obstetrics and Gynecology

## 2022-04-03 ENCOUNTER — Ambulatory Visit (INDEPENDENT_AMBULATORY_CARE_PROVIDER_SITE_OTHER): Payer: BC Managed Care – PPO | Admitting: Obstetrics and Gynecology

## 2022-04-03 VITALS — BP 118/74 | HR 75

## 2022-04-03 DIAGNOSIS — Z9889 Other specified postprocedural states: Secondary | ICD-10-CM

## 2022-04-03 NOTE — Progress Notes (Signed)
[facility]  Date of Visit: 04/03/2022  History of Present Illness: [name] is a 51 y.o. female scheduled today for a post-operative visit.   Surgery: s/p Posterior repair, sacrospinous ligament fixation, perineorrhaphy, anal sphincteroplasty on 01/26/22 S/p EUA and revision perineoplasty on 02/21/22.   Has been feeling better overall. Has had 3-4 panty liners per day. Noticed serous fluid with blood. Has been doing half days at work. Feels pain near the tailbone. She is taking the gabapentin at night but otherwise not on pain medication. Using the vaginal estrogen cream three times a week.   Had a UTI last week and did a urine culture at work. Culture was positive for klebsiella and she is taking bactrim.    Medications: She has a current medication list which includes the following prescription(s): acetaminophen, bystolic, cetirizine, clobetasol ointment, cyclobenzaprine, estradiol, gabapentin, levothyroxine, metronidazole, pantoprazole, polyethylene glycol 3350, tamoxifen, tramadol, triamcinolone, and wegovy.   Allergies: Patient is allergic to codeine, hyaluronic acid [sodium hyaluronate], hydrocodone, other, latex, and wound dressing adhesive.   Physical Exam: BP 118/74   Pulse 75    Perineal Incision: intact except for 0.5cm superficial area, no active bleeding.  On speculum exam,  small suture present on posterior vaginal wall but otherwise tissue appears healthy and healing well. Sutures present at apex. Minimal clear discharge noted.   POP-Q: POP-Q  -2.5                                            Aa   -2.5                                           Ba  -8                                              C   2.5                                            Gh  4                                            Pb  8                                            tvl   -3                                            Ap  -3  Bp                                                  D    ---------------------------------------------------------  Assessment and Plan:  1. Post-operative state    - Healing improved, no prolapse noted.  - Continue vaginal estrogen to 3 times per week.  - For tailbone pain, continue gabapentin as needed  Return 3 weeks for follow up  Sandy Folds, MD

## 2022-04-20 ENCOUNTER — Ambulatory Visit: Payer: BC Managed Care – PPO | Attending: General Surgery

## 2022-04-20 VITALS — Wt 180.2 lb

## 2022-04-20 DIAGNOSIS — Z483 Aftercare following surgery for neoplasm: Secondary | ICD-10-CM

## 2022-04-20 NOTE — Therapy (Signed)
OUTPATIENT PHYSICAL THERAPY SOZO SCREENING NOTE   Patient Name: Sandy Goodwin MRN: [id number] DOB:Mar 29, 1971, 52 y.o., female Today's Date: 04/20/2022  PCP: Caryl Bis, MD REFERRING PROVIDER: Rolm Bookbinder, MD   PT End of Session - 04/20/22 1059     Visit Number 17   # unchanged due to screen only   PT Start Time 1056    PT Stop Time 1101    PT Time Calculation (min) 5 min    Activity Tolerance Patient tolerated treatment well    Behavior During Therapy [facility] for tasks assessed/performed             Past Medical History:  Diagnosis Date   Full incontinence-feces    History of anemia    History of cancer chemotherapy    right breast;   neo-adjuvent 07-24-2020  to 11-15-2020  and 12-05-2020  to 07-24-2021   History of external beam radiation therapy    right reast 02-04-2021  to 03-25-2021   History of gastric ulcer    remote hx -- resolved   Hypothyroidism, postsurgical    followed by pcp;     02/ 2012  s/p  right hemithyroidectomy  (follicular adenoma)   Malignant neoplasm of upper-outer quadrant of right breast in female, estrogen receptor positive ([place]) 06/2020   oncologist--- dr Lindi Adie;  DCIS high grade right breast , neo-adjuvant chemo completed 11-15-2020,  s/p right breast lumpectomy w/ node dissection on 12-11-2020, then chemo completed 07-24-2021 and radiation completed 03-25-2021   Polyarthralgia    PONV (postoperative nausea and vomiting)    With first surgery, none since   Prolapse of vaginal vault after hysterectomy    and posterior prolapse   RA (rheumatoid arthritis) ([place])    rheumatologsit--- dr Amil Amen;  no treatment   Tachycardia    Tachycardia   Past Surgical History:  Procedure Laterality Date   ANTERIOR AND POSTERIOR REPAIR WITH SACROSPINOUS FIXATION N/A 01/26/2022   Procedure: POSTERIOR REPAIR WITH SACROSPINOUS FIXATION;  Surgeon: [name], MD;  [facility];  Service: Gynecology;  Laterality: N/A;   total time requested is 2 hours   APPENDECTOMY  08/2006   exploratory laparatomy w/ appendectomy (ruptured) and repair bowel   BREAST LUMPECTOMY WITH RADIOACTIVE SEED AND SENTINEL LYMPH NODE BIOPSY Right 12/11/2020   Procedure: RIGHT BREAST LUMPECTOMY WITH RADIOACTIVE SEED X2 AND RIGHT AXILLARY SENTINEL LYMPH NODE BIOPSY;  Surgeon: Rolm Bookbinder, MD;  Location: [place];  Service: General;  Laterality: Right;   BREAST RECONSTRUCTION Right 12/20/2020   Procedure: RIGHT ONCOPLASTIC RECONSTRUCTION;  Surgeon: Irene Limbo, MD;  Location: [place];  Service: Plastics;  Laterality: Right;   BREAST REDUCTION SURGERY Left 12/20/2020   Procedure: LEFT BREAST REDUCTION;  Surgeon: Irene Limbo, MD;  Location: [place];  Service: Plastics;  Laterality: Left;   COLONOSCOPY N/A 06/15/2016   Procedure: COLONOSCOPY;  Surgeon: Rogene Houston, MD;  [facility];  Service: Endoscopy;  Laterality: N/A;  [place]  06/03/2011   w/ suction for miscarriage   EXCISION OF BREAST BIOPSY Right 1999   INCISION AND DRAINAGE OF WOUND Right 01/10/2021   Procedure: INCISION AND DRAINAGE OF RIGHT AXILLA;  Surgeon: Irene Limbo, MD;  Location: [place];  Service: Plastics;  Laterality: Right;   OVUM / OOCYTE RETRIEVAL     2010  and 2012   PERINEOPLASTY N/A 01/26/2022   Procedure: PERINEORRHAPY;  Surgeon: Sherlene Shams  N, MD;  Location: [place];  Service: Gynecology;  Laterality: N/A;   PERINEOPLASTY N/A 02/19/2022   Procedure: PERINEOPLASTY revision;  Surgeon: [name], MD;  Location[facility];  Service: Gynecology;  Laterality: N/A;   PORTACATH PLACEMENT Left 07/23/2020   Procedure: INSERTION PORT-A-CATH;  Surgeon: Rolm Bookbinder, MD;  Location: [place];  Service: General;  Laterality: Left;   THYROID LOBECTOMY Right  05/2010   [facility]   VAGINAL HYSTERECTOMY  03/21/2019   @ [facility] by dr Barrie Dunker;   w/ Gaspar Garbe culdoplasty   Patient Active Problem List   Diagnosis Date Noted   Prolapse of posterior vaginal wall 01/26/2022   Uterine prolapse 03/20/2021   Polyarthralgia 03/20/2021   Port-A-Cath in place 02/06/2021   Genetic testing 07/17/2020   Malignant neoplasm of upper-outer quadrant of right breast in female, estrogen receptor positive ([place]) 07/12/2020   Change in bowel habits 06/12/2016   Mixed hyperlipidemia 01/07/2016   Hypothyroidism, unspecified 01/07/2016   Allergic rhinitis 04/02/2015   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 No  SOZO SCREENING: Patient was assessed today using the SOZO machine to determine the lymphedema index score. This was compared to her baseline score. It was determined that she is within the recommended range when compared to her baseline and no further action is needed at this time. She will continue SOZO screenings. These are done every 3 months for 2 years post operatively followed by every 6 months for 2 years, and then annually.   L-DEX FLOWSHEETS - 04/20/22 1100       L-DEX LYMPHEDEMA SCREENING   Measurement Type Unilateral    L-DEX MEASUREMENT EXTREMITY Upper Extremity    POSITION  Standing    DOMINANT SIDE Right    At Risk Side Right    BASELINE SCORE (UNILATERAL) -3    L-DEX SCORE (UNILATERAL) -5.3    VALUE CHANGE (UNILAT) -2.3              Otelia Limes, PTA 04/20/2022, 11:00 AM

## 2022-04-22 LAB — SIGNATERA ONLY (NATERA MANAGED)

## 2022-04-24 ENCOUNTER — Ambulatory Visit (INDEPENDENT_AMBULATORY_CARE_PROVIDER_SITE_OTHER): Payer: BC Managed Care – PPO | Admitting: Obstetrics and Gynecology

## 2022-04-24 ENCOUNTER — Encounter: Payer: Self-pay | Admitting: Obstetrics and Gynecology

## 2022-04-24 VITALS — BP 127/85 | HR 71

## 2022-04-24 DIAGNOSIS — N898 Other specified noninflammatory disorders of vagina: Secondary | ICD-10-CM

## 2022-04-24 MED ORDER — LIDOCAINE 5 % EX OINT: 1.0000 | TOPICAL_OINTMENT | CUTANEOUS | 0 refills | Status: DC | PRN

## 2022-04-24 NOTE — Progress Notes (Signed)
[facility]  Date of Visit: 04/24/2022  History of Present Illness: [name] is a 52 y.o. female scheduled today for a post-operative visit.   Surgery: s/p Posterior repair, sacrospinous ligament fixation, perineorrhaphy, anal sphincteroplasty on 01/26/22 S/p EUA and revision perineoplasty on 02/21/22.   Has been feeling better overall. Has minimal vaginal discharge. Pain has improved.  Using the vaginal estrogen cream three times a week. Denies vaginal bleeding    Medications: She has a current medication list which includes the following prescription(s): lidocaine, acetaminophen, bystolic, cetirizine, clobetasol ointment, cyclobenzaprine, estradiol, gabapentin, levothyroxine, metronidazole, pantoprazole, polyethylene glycol 3350, tamoxifen, tramadol, triamcinolone, and wegovy.   Allergies: Patient is allergic to codeine, hyaluronic acid [sodium hyaluronate], hydrocodone, other, latex, and wound dressing adhesive.   Physical Exam: BP 127/85   Pulse 71    Perineal Incision: intact On speculum exam,  well healed. Sutures present at apex. Tenderness on palpation at introitus but not deeper inside.   POP-Q (04/04/23): POP-Q   -2.5                                            Aa   -2.5                                           Ba   -8                                              C    2.5                                            Gh   4                                            Pb   8                                            tvl    -3                                            Ap   -3                                            Bp                                                  D       ---------------------------------------------------------  Assessment and Plan:  1. Vaginal irritation     - Well healed - Continue vaginal estrogen to 3 times per week.  - For pain at introitus, discussed perineal massage with her finger and lubricant. She has also  been placing the estrogen cream higher up, so recommended also using it at the vaginal opening. Prescribed lidocaine ointment to use as needed prior to massage or intercourse. If no improvement, can plan for PT.  - Can resume all activity, intercourse, baths, etc.   Follow up as needed  [name], MD

## 2022-04-24 NOTE — Patient Instructions (Addendum)
Vulvovaginal moisturizer Options: Vitamin E oil (pump or capsule) or cream (Sandy Goodwin's Vit E Cream) Coconut oil Silicone-based lubricant for use during intercourse ("uberlube" on [place] or "wet platinum" is a brand available at most drugstores)  Consider the ingredients of the product - the fewer the ingredients the better!  Directions for Use: Clean and dry your hands Gently dab the vulvar/vaginal area dry as needed Apply a "pea-sized" amount of the moisturizer onto your fingertip Using you other hand, open the labia  Apply the moisturizer to the vulvar/vaginal tissues Wear loose fitting underwear/clothing if possible following application Use moisturize up to 3 times daily as desired.   Continue with vaginal estrogen twice a week.

## 2022-05-11 ENCOUNTER — Telehealth: Payer: Self-pay | Admitting: *Deleted

## 2022-05-11 NOTE — Telephone Encounter (Signed)
Per MD request, RN placed call to pt regarding negative (Not Detected) recent Signatera testing.  Pt appreciative of call and verbalized understanding.  

## 2022-05-12 ENCOUNTER — Encounter: Payer: Self-pay | Admitting: Hematology and Oncology

## 2022-05-20 ENCOUNTER — Other Ambulatory Visit: Payer: Self-pay | Admitting: Obstetrics and Gynecology

## 2022-05-20 DIAGNOSIS — Z01818 Encounter for other preprocedural examination: Secondary | ICD-10-CM

## 2022-05-21 ENCOUNTER — Encounter: Payer: Self-pay | Admitting: Obstetrics and Gynecology

## 2022-05-21 DIAGNOSIS — N952 Postmenopausal atrophic vaginitis: Secondary | ICD-10-CM

## 2022-05-21 MED ORDER — ESTRADIOL 0.1 MG/GM VA CREA: 0.5000 g | TOPICAL_CREAM | VAGINAL | 0 refills | Status: DC

## 2022-05-21 MED ORDER — ESTRADIOL 0.1 MG/GM VA CREA: 0.5000 g | TOPICAL_CREAM | VAGINAL | 11 refills | Status: DC

## 2022-05-21 NOTE — Telephone Encounter (Signed)
Prescription for Estrace sent in for patient. She had been instructed by provider to use it more frequently than twice a week to promote wound healing at the suture line. She has been doing this in the vagina as instructed by physician.

## 2022-05-21 NOTE — Telephone Encounter (Signed)
Called and spoke to patient and she reports she does not need a refill on either medication.

## 2022-05-21 NOTE — Addendum Note (Signed)
Addended by: Berton Mount on: 05/21/2022 02:50 PM   Modules accepted: Orders

## 2022-06-01 ENCOUNTER — Encounter: Payer: Self-pay | Admitting: *Deleted

## 2022-06-09 ENCOUNTER — Encounter: Payer: Self-pay | Admitting: Obstetrics and Gynecology

## 2022-06-09 DIAGNOSIS — Z9889 Other specified postprocedural states: Secondary | ICD-10-CM

## 2022-06-09 MED ORDER — NONFORMULARY OR COMPOUNDED ITEM: 5 refills | Status: DC

## 2022-07-08 ENCOUNTER — Telehealth: Payer: Self-pay | Admitting: *Deleted

## 2022-07-08 NOTE — Telephone Encounter (Signed)
Per MD request, RN placed call to pt regarding negative (Not Detected) recent Signatera testing.  Pt appreciative of call and verbalized understanding.  

## 2022-07-14 ENCOUNTER — Encounter: Payer: Self-pay | Admitting: Hematology and Oncology

## 2022-07-16 ENCOUNTER — Other Ambulatory Visit: Payer: Self-pay | Admitting: Hematology and Oncology

## 2022-07-16 ENCOUNTER — Ambulatory Visit
Admission: RE | Admit: 2022-07-16 | Discharge: 2022-07-16 | Disposition: A | Discharge status: Home / Self Care | Payer: BC Managed Care – PPO | Source: Ambulatory Visit | Attending: Hematology and Oncology | Admitting: Hematology and Oncology

## 2022-07-16 DIAGNOSIS — Z170 Estrogen receptor positive status [ER+]: Secondary | ICD-10-CM

## 2022-07-16 DIAGNOSIS — R921 Mammographic calcification found on diagnostic imaging of breast: Secondary | ICD-10-CM

## 2022-07-24 ENCOUNTER — Other Ambulatory Visit: Payer: Self-pay

## 2022-07-24 ENCOUNTER — Encounter: Payer: Self-pay | Admitting: Physical Therapy

## 2022-07-24 ENCOUNTER — Ambulatory Visit: Payer: BC Managed Care – PPO | Attending: Obstetrics and Gynecology | Admitting: Physical Therapy

## 2022-07-24 DIAGNOSIS — M62838 Other muscle spasm: Secondary | ICD-10-CM | POA: Diagnosis present

## 2022-07-24 DIAGNOSIS — M6281 Muscle weakness (generalized): Secondary | ICD-10-CM | POA: Diagnosis present

## 2022-07-24 DIAGNOSIS — R279 Unspecified lack of coordination: Secondary | ICD-10-CM

## 2022-07-24 DIAGNOSIS — R293 Abnormal posture: Secondary | ICD-10-CM | POA: Diagnosis present

## 2022-07-24 NOTE — Therapy (Signed)
OUTPATIENT PHYSICAL THERAPY FEMALE PELVIC EVALUATION   Patient Name: Sandy Goodwin MRN: [id number] DOB:Dec 13, 1970, 52 y.o., female Today's Date: 07/24/2022  END OF SESSION:  PT End of Session - 07/24/22 0930     Visit Number 1    Date for PT Re-Evaluation 10/23/22    Authorization Type BC[employer]    PT Start Time 0930    PT Stop Time 1010    PT Time Calculation (min) 40 min    Activity Tolerance Patient tolerated treatment well    Behavior During Therapy [facility] for tasks assessed/performed             Past Medical History:  Diagnosis Date   Full incontinence-feces    History of anemia    History of cancer chemotherapy    right breast;   neo-adjuvent 07-24-2020  to 11-15-2020  and 12-05-2020  to 07-24-2021   History of external beam radiation therapy    right reast 02-04-2021  to 03-25-2021   History of gastric ulcer    remote hx -- resolved   Hypothyroidism, postsurgical    followed by pcp;     02/ 2012  s/p  right hemithyroidectomy  (follicular adenoma)   Malignant neoplasm of upper-outer quadrant of right breast in female, estrogen receptor positive 06/2020   oncologist--- dr Pamelia Hoit;  DCIS high grade right breast , neo-adjuvant chemo completed 11-15-2020,  s/p right breast lumpectomy w/ node dissection on 12-11-2020, then chemo completed 07-24-2021 and radiation completed 03-25-2021   Polyarthralgia    PONV (postoperative nausea and vomiting)    With first surgery, none since   Prolapse of vaginal vault after hysterectomy    and posterior prolapse   RA (rheumatoid arthritis)    rheumatologsit--- dr Dierdre Forth;  no treatment   Tachycardia    Tachycardia   Past Surgical History:  Procedure Laterality Date   ANTERIOR AND POSTERIOR REPAIR WITH SACROSPINOUS FIXATION N/A 01/26/2022   Procedure: POSTERIOR REPAIR WITH SACROSPINOUS FIXATION;  Surgeon: Marguerita Beards, MD;  Location: [facility];  Service: Gynecology;  Laterality: N/A;  total time requested is 2  hours   APPENDECTOMY  08/2006   exploratory laparatomy w/ appendectomy (ruptured) and repair bowel   BREAST LUMPECTOMY WITH RADIOACTIVE SEED AND SENTINEL LYMPH NODE BIOPSY Right 12/11/2020   Procedure: RIGHT BREAST LUMPECTOMY WITH RADIOACTIVE SEED X2 AND RIGHT AXILLARY SENTINEL LYMPH NODE BIOPSY;  Surgeon: Emelia Loron, MD;  Location: [facility];  Service: General;  Laterality: Right;   BREAST RECONSTRUCTION Right 12/20/2020   Procedure: RIGHT ONCOPLASTIC RECONSTRUCTION;  Surgeon: Glenna Fellows, MD;  Location: [facility];  Service: Plastics;  Laterality: Right;   BREAST REDUCTION SURGERY Left 12/20/2020   Procedure: LEFT BREAST REDUCTION;  Surgeon: Glenna Fellows, MD;  Location: [facility];  Service: Plastics;  Laterality: Left;   COLONOSCOPY N/A 06/15/2016   Procedure: COLONOSCOPY;  Surgeon: Malissa Hippo, MD;  [facility];  Service: Endoscopy;  Laterality: N/A;  845   DILATION AND CURETTAGE OF UTERUS  06/03/2011   w/ suction for miscarriage   EXCISION OF BREAST BIOPSY Right 1999   INCISION AND DRAINAGE OF WOUND Right 01/10/2021   Procedure: INCISION AND DRAINAGE OF RIGHT AXILLA;  Surgeon: Glenna Fellows, MD;  Location: [facility];  Service: Plastics;  Laterality: Right;   OVUM / OOCYTE RETRIEVAL     2010  and 2012   PERINEOPLASTY N/A 01/26/2022   Procedure: PERINEORRHAPY;  Surgeon: Marguerita Beards, MD;  Location: [facility];  Service: Gynecology;  Laterality: N/A;   PERINEOPLASTY N/A 02/19/2022   Procedure: PERINEOPLASTY revision;  Surgeon: Marguerita Beards, MD;  Location: [facility];  Service: Gynecology;  Laterality: N/A;   PORTACATH PLACEMENT Left 07/23/2020   Procedure: INSERTION PORT-A-CATH;  Surgeon: Emelia Loron, MD;  Location: [facility];  Service: General;  Laterality: Left;   THYROID LOBECTOMY Right 05/2010   @[facility]   VAGINAL  HYSTERECTOMY  03/21/2019   @ [facility] by dr Mora Appl;   w/ Rogelio Seen culdoplasty   Patient Active Problem List   Diagnosis Date Noted   Prolapse of posterior vaginal wall 01/26/2022   Uterine prolapse 03/20/2021   Polyarthralgia 03/20/2021   Port-A-Cath in place 02/06/2021   Genetic testing 07/17/2020   Malignant neoplasm of upper-outer quadrant of right breast in female, estrogen receptor positive 07/12/2020   Change in bowel habits 06/12/2016   Mixed hyperlipidemia 01/07/2016   Hypothyroidism, unspecified 01/07/2016   Allergic rhinitis 04/02/2015   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    PCP: Richardean Chimera, MD  REFERRING PROVIDER: Selmer Dominion, NP  REFERRING DIAG: [phone] (ICD-10-CM) - Post-operative state  THERAPY DIAG:  Other muscle spasm  Unspecified lack of coordination  Muscle weakness (generalized)  Abnormal posture  Rationale for Evaluation and Treatment: Rehabilitation  ONSET DATE: noticed at last pelvic exam in [place]  SUBJECTIVE:                                                                                                                                                                                           SUBJECTIVE STATEMENT: Pt reports she is having a little pain at "back wall" and reports having some "tightness". Feels weak in core and pelvic floor since not being able to exercise until jan/February and wants to work on this.  Speculum touching it was painful and sex is uncomfortable.    PAIN:  Are you having pain? Yes NPRS scale: 8/10 Pain location:  posterior pelvic floor   Pain type: sharp Pain description: intermittent   Aggravating factors: only if something touches it Relieving factors: not having something touching it resolves immediately.   PRECAUTIONS: Other: breast CA - not currently getting treatments   WEIGHT BEARING RESTRICTIONS: No  FALLS:  Has patient fallen in last 6 months? No  LIVING ENVIRONMENT: Lives with:  lives with their family   OCCUPATION: PA  PLOF: Independent  PATIENT GOALS: to have no pain with sex  PERTINENT HISTORY:  She is allergic to hyaluronic acid.  10/23/2022 Muscle weakness (generalized)  Malignant neoplasm of upper-outer quadrant of right breast in female, estrogen  receptor positive [facility])  Surgery: s/p Posterior repair, sacrospinous ligament fixation, perineorrhaphy, anal sphincteroplasty on 01/26/22 S/p EUA and revision perineoplasty on 02/21/22.  Sexual abuse: No  BOWEL MOVEMENT: Pain with bowel movement: No Type of bowel movement: normal for pt Fully empty rectum: Yes:   Leakage: No Pads: No Fiber supplement: No  URINATION: Pain with urination: No Fully empty bladder: Yes:   Stream: Strong Urgency: No Frequency: not quicker than every 2 hours Leakage:  none Pads: No  INTERCOURSE: Pain with intercourse: Initial Penetration and During Penetration Ability to have vaginal penetration:  Yes: but painful Climax: not really due to pain Marinoff Scale: 2/3  PREGNANCY: Vaginal deliveries 2 Tearing Yes: 3rd deg tear posterior with first and anterior second  C-section deliveries 0 Currently pregnant No  PROLAPSE: None   OBJECTIVE:   DIAGNOSTIC FINDINGS:    COGNITION: Overall cognitive status: Within functional limits for tasks assessed     SENSATION: Light touch: Appears intact Proprioception: Appears intact  MUSCLE LENGTH: Hamstrings and adductors limited by 25%    POSTURE: rounded shoulders, forward head, and flexed trunk   LUMBARAROM/PROM:  A/PROM A/PROM  eval  Flexion Limited by 25%  Extension WFL  Right lateral flexion WFL  Left lateral flexion WFL  Right rotation WFL  Left rotation WFL   (Blank rows = not tested)  LOWER EXTREMITY ROM:  WFL  LOWER EXTREMITY MMT:  Hip abduction 3+/5 bil, all other 4/5, knees 5/5 PALPATION:   General  no TTP externally                 External Perineal Exam mild TTP at posterior  vaginal opening externally                             Internal Pelvic Floor bil bulbocavernosus, at vaginal opening at clock face 8-4 locations, no TTP at deep layers except near coccyx. perineal scar tissue restriction/tenderness,   Patient confirms identification and approves PT to assess internal pelvic floor and treatment Yes  PELVIC MMT:   MMT eval  Vaginal 3/5, 8s, 10 reps  Internal Anal Sphincter   External Anal Sphincter   Puborectalis   Diastasis Recti   (Blank rows = not tested)        TONE: Pelvic floor atrophy   PROLAPSE: Not seen in hooklying   TODAY'S TREATMENT:                                                                                                                              DATE:   07/24/2022 EVAL Examination completed, findings reviewed, pt educated on POC, HEP, and self mobility at posterior vaginal opening and perineal body. Manual work completed for improved mobility and gentle stretching at posterior pelvic floor. Pt reported feeling a little better at end of session. Pt motivated to participate in PT and agreeable to attempt recommendations.     PATIENT  EDUCATION:  Education details: [id number] Person educated: Patient Education method: Programmer, multimedia, Demonstration, Actor cues, Verbal cues, and Handouts Education comprehension: verbalized understanding and returned demonstration  HOME EXERCISE PROGRAM: [id number]  ASSESSMENT:  CLINICAL IMPRESSION: Patient is a 52 y.o. female  who was seen today for physical therapy evaluation and treatment for pelvic pain. Pt did have Posterior repair, sacrospinous ligament fixation, perineorrhaphy, anal sphincteroplasty on 01/26/22 S/p EUA and revision perineoplasty on 02/21/22. Pt has history of breast cancer and was previously seen in this clinic for pain but overall that has remained improved. Now since pt has been cleared post surgery pt reports she has pain with vaginal penetration and with touch at posterior  vaginal opening and deeper in posterior side of pelvic floor. Pt found to have decreased mobility at perineal body, scar tissue restrictions, TTP at perineal body and posterior side of vaginal opening and at deep muscle layer toward coccyx. Pt was educated on potential benefit of rectal assessment/treatment due to closer proximity to coccyx and pt reports she would think about this. Pt found to have restrictions at posterior pelvic floor, flexibility with lumbar spine and bil hips, tension at pelvic floor and decreased strength. Manual treatment completed at posterior vaginal opening for gentle stretching within pt tolerance. While doing this pt educated on self stretching at home to continue to improve mobility here and pt verbalized understanding.   OBJECTIVE IMPAIRMENTS: decreased coordination, decreased endurance, decreased mobility, decreased strength, increased fascial restrictions, increased muscle spasms, impaired flexibility, improper body mechanics, postural dysfunction, and pain.   ACTIVITY LIMITATIONS: sitting and intercourse, medical assessments  with internal needs  PARTICIPATION LIMITATIONS: interpersonal relationship and community activity  PERSONAL FACTORS: 1 comorbidity: medical history  are also affecting patient's functional outcome.   REHAB POTENTIAL: Good  CLINICAL DECISION MAKING: Stable/uncomplicated  EVALUATION COMPLEXITY: Low   GOALS: Goals reviewed with patient? Yes  SHORT TERM GOALS: Target date: 5//10/24  Pt to be I with advanced HEP.  Baseline: Goal status: INITIAL  2.  Pt will report no more than 5/10 pain due to improvements in posture, strength, and muscle length  Baseline:  Goal status: INITIAL   LONG TERM GOALS: Target date: 10/23/22  Pt to be I with advanced HEP.  Baseline:  Goal status: INITIAL 2.  Pt will report no more than 2/10 pain due to improvements in posture, strength, and muscle length  Baseline:  Goal status: INITIAL  3.  Pt to  report improved tolerance to vaginal penetration for improved ability to be able to climax at least 75% of the instance of intercourse.  Baseline:  Goal status: INITIAL  4.  Pt to be I with manual self stretching of perineal body/vaginal opening to improve tissue mobility and decrease pain at home.  Baseline:  Goal status: INITIAL  5.  Pt to demonstrate at least 4/5 pelvic floor strength with ability to fully relax post contraction for improved pelvic stability and decreased strain at pelvic floor/ decrease leakage.  Baseline:  Goal status: INITIAL  6.  Pt to demonstrate at least 5/5 bil hip strength with good coordination of pelvic floor and breathing  for improved pelvic stability and functional squats without leakage.   Baseline:  Goal status: INITIAL   PLAN:  PT FREQUENCY: 1x/week  PT DURATION:  8 sessions  PLANNED INTERVENTIONS: Therapeutic exercises, Therapeutic activity, Neuromuscular re-education, Balance training, Gait training, Patient/Family education, Self Care, Aquatic Therapy, Dry Needling, Spinal mobilization, Cryotherapy, Moist heat, scar mobilization, Taping, Biofeedback, and Manual therapy  PLAN FOR  NEXT SESSION: manual work at perineal mobility, scar mobility, core and hip strengthening, coordination of pelvic floor and breathing mechanics   Otelia Sergeant, PT, DPT 07/23/2408:53 AM

## 2022-07-30 ENCOUNTER — Ambulatory Visit
Admission: RE | Admit: 2022-07-30 | Discharge: 2022-07-30 | Disposition: A | Discharge status: Home / Self Care | Payer: BC Managed Care – PPO | Source: Ambulatory Visit | Attending: Hematology and Oncology | Admitting: Hematology and Oncology

## 2022-07-30 DIAGNOSIS — R921 Mammographic calcification found on diagnostic imaging of breast: Secondary | ICD-10-CM

## 2022-07-30 HISTORY — PX: BREAST BIOPSY: SHX20

## 2022-08-03 ENCOUNTER — Ambulatory Visit: Payer: BC Managed Care – PPO | Attending: General Surgery

## 2022-08-03 DIAGNOSIS — Z483 Aftercare following surgery for neoplasm: Secondary | ICD-10-CM

## 2022-08-03 NOTE — Therapy (Signed)
OUTPATIENT PHYSICAL THERAPY SOZO SCREENING NOTE   Patient Name: Sandy Goodwin MRN: [id number] DOB:1970/12/30, 52 y.o., female Today's Date: 08/03/2022  PCP: Richardean Chimera, MD REFERRING PROVIDER: Emelia Loron, MD   PT End of Session - 08/03/22 1103     Visit Number 1   # unchnaged due to screen only   PT Start Time 1101    PT Stop Time 1105    PT Time Calculation (min) 4 min    Activity Tolerance Patient tolerated treatment well    Behavior During Therapy [facility] for tasks assessed/performed             Past Medical History:  Diagnosis Date   Full incontinence-feces    History of anemia    History of cancer chemotherapy    right breast;   neo-adjuvent 07-24-2020  to 11-15-2020  and 12-05-2020  to 07-24-2021   History of external beam radiation therapy    right reast 02-04-2021  to 03-25-2021   History of gastric ulcer    remote hx -- resolved   Hypothyroidism, postsurgical    followed by pcp;     02/ 2012  s/p  right hemithyroidectomy  (follicular adenoma)   Malignant neoplasm of upper-outer quadrant of right breast in female, estrogen receptor positive 06/2020   oncologist--- dr Pamelia Hoit;  DCIS high grade right breast , neo-adjuvant chemo completed 11-15-2020,  s/p right breast lumpectomy w/ node dissection on 12-11-2020, then chemo completed 07-24-2021 and radiation completed 03-25-2021   Polyarthralgia    PONV (postoperative nausea and vomiting)    With first surgery, none since   Prolapse of vaginal vault after hysterectomy    and posterior prolapse   RA (rheumatoid arthritis)    rheumatologsit--- dr Dierdre Forth;  no treatment   Tachycardia    Tachycardia   Past Surgical History:  Procedure Laterality Date   ANTERIOR AND POSTERIOR REPAIR WITH SACROSPINOUS FIXATION N/A 01/26/2022   Procedure: POSTERIOR REPAIR WITH SACROSPINOUS FIXATION;  Surgeon: Marguerita Beards, MD;  Location[facility];  Service: Gynecology;  Laterality: N/A;  total time  requested is 2 hours   APPENDECTOMY  08/2006   exploratory laparatomy w/ appendectomy (ruptured) and repair bowel   BREAST BIOPSY Right 07/30/2022   MM RT BREAST BX W LOC DEV 1ST LESION IMAGE BX SPEC STEREO GUIDE 07/30/2022 [facility]   BREAST LUMPECTOMY WITH RADIOACTIVE SEED AND SENTINEL LYMPH NODE BIOPSY Right 12/11/2020   Procedure: RIGHT BREAST LUMPECTOMY WITH RADIOACTIVE SEED X2 AND RIGHT AXILLARY SENTINEL LYMPH NODE BIOPSY;  Surgeon: Emelia Loron, MD;  Location: [facility];  Service: General;  Laterality: Right;   BREAST RECONSTRUCTION Right 12/20/2020   Procedure: RIGHT ONCOPLASTIC RECONSTRUCTION;  Surgeon: Glenna Fellows, MD;  Location: [facility];  Service: Plastics;  Laterality: Right;   BREAST REDUCTION SURGERY Left 12/20/2020   Procedure: LEFT BREAST REDUCTION;  Surgeon: Glenna Fellows, MD;  Location: [facility];  Service: Plastics;  Laterality: Left;   COLONOSCOPY N/A 06/15/2016   Procedure: COLONOSCOPY;  Surgeon: Malissa Hippo, MD;  [facility];  Service: Endoscopy;  Laterality: N/A;  845   DILATION AND CURETTAGE OF UTERUS  06/03/2011   w/ suction for miscarriage   EXCISION OF BREAST BIOPSY Right 1999   INCISION AND DRAINAGE OF WOUND Right 01/10/2021   Procedure: INCISION AND DRAINAGE OF RIGHT AXILLA;  Surgeon: Glenna Fellows, MD;  Location: [facility];  Service: Plastics;  Laterality: Right;   OVUM /  OOCYTE RETRIEVAL     2010  and 2012   PERINEOPLASTY N/A 01/26/2022   Procedure: PERINEORRHAPY;  Surgeon: Marguerita Beards, MD;  Location: [facility];  Service: Gynecology;  Laterality: N/A;   PERINEOPLASTY N/A 02/19/2022   Procedure: PERINEOPLASTY revision;  Surgeon: Marguerita Beards, MD;  Location: [facility];  Service: Gynecology;  Laterality: N/A;   PORTACATH PLACEMENT Left 07/23/2020   Procedure: INSERTION PORT-A-CATH;  Surgeon: Emelia Loron, MD;  Location: [facility];  Service: General;  Laterality: Left;   THYROID LOBECTOMY Right 05/2010   -[facility]   VAGINAL HYSTERECTOMY  03/21/2019   @ [facility] by dr Mora Appl;   w/ Rogelio Seen culdoplasty   Patient Active Problem List   Diagnosis Date Noted   Prolapse of posterior vaginal wall 01/26/2022   Uterine prolapse 03/20/2021   Polyarthralgia 03/20/2021   Port-A-Cath in place 02/06/2021   Genetic testing 07/17/2020   Malignant neoplasm of upper-outer quadrant of right breast in female, estrogen receptor positive 07/12/2020   Change in bowel habits 06/12/2016   Mixed hyperlipidemia 01/07/2016   Hypothyroidism, unspecified 01/07/2016   Allergic rhinitis 04/02/2015   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   PAIN:  Are you having pain? No  SOZO SCREENING: Patient was assessed today using the SOZO machine to determine the lymphedema index score. This was compared to her baseline score. It was determined that she is within the recommended range when compared to her baseline and no further action is needed at this time. She will continue SOZO screenings. These are done every 3 months for 2 years post operatively followed by every 6 months for 2 years, and then annually.   L-DEX FLOWSHEETS - 08/03/22 1100       L-DEX LYMPHEDEMA SCREENING   Measurement Type Unilateral    L-DEX MEASUREMENT EXTREMITY Upper Extremity    POSITION  Standing    DOMINANT SIDE Right    At Risk Side Right    BASELINE SCORE (UNILATERAL) -3    L-DEX SCORE (UNILATERAL) -4.2    VALUE CHANGE (UNILAT) -1.2              Hermenia Bers,  PTA 08/03/2022, 11:06 AM

## 2022-08-06 ENCOUNTER — Ambulatory Visit: Payer: BC Managed Care – PPO | Admitting: Physical Therapy

## 2022-08-06 DIAGNOSIS — M62838 Other muscle spasm: Secondary | ICD-10-CM | POA: Diagnosis not present

## 2022-08-06 DIAGNOSIS — M6281 Muscle weakness (generalized): Secondary | ICD-10-CM

## 2022-08-06 DIAGNOSIS — R279 Unspecified lack of coordination: Secondary | ICD-10-CM

## 2022-08-06 DIAGNOSIS — R293 Abnormal posture: Secondary | ICD-10-CM

## 2022-08-06 NOTE — Therapy (Signed)
OUTPATIENT PHYSICAL THERAPY FEMALE PELVIC EVALUATION   Patient Name: CYNTHA BRICKMAN MRN: [id number] DOB:[birth year], 52 y.o., female Today's Date: 08/06/2022  END OF SESSION:  PT End of Session - 08/06/22 1400     Visit Number 2    Date for PT Re-Evaluation 10/23/22    Authorization Type [employer]    PT Start Time 1400    PT Stop Time 1438    PT Time Calculation (min) 38 min    Activity Tolerance Patient tolerated treatment well    Behavior During Therapy [facility] for tasks assessed/performed             Past Medical History:  Diagnosis Date   Full incontinence-feces    History of anemia    History of cancer chemotherapy    right breast;   neo-adjuvent 07-24-2020  to 11-15-2020  and 12-05-2020  to 07-24-2021   History of external beam radiation therapy    right reast 02-04-2021  to 03-25-2021   History of gastric ulcer    remote hx -- resolved   Hypothyroidism, postsurgical    followed by pcp;     02/ 2012  s/p  right hemithyroidectomy  (follicular adenoma)   Malignant neoplasm of upper-outer quadrant of right breast in female, estrogen receptor positive 06/2020   oncologist--- dr Pamelia Hoit;  DCIS high grade right breast , neo-adjuvant chemo completed 11-15-2020,  s/p right breast lumpectomy w/ node dissection on 12-11-2020, then chemo completed 07-24-2021 and radiation completed 03-25-2021   Polyarthralgia    PONV (postoperative nausea and vomiting)    With first surgery, none since   Prolapse of vaginal vault after hysterectomy    and posterior prolapse   RA (rheumatoid arthritis)    rheumatologsit--- dr Dierdre Forth;  no treatment   Tachycardia    Tachycardia   Past Surgical History:  Procedure Laterality Date   ANTERIOR AND POSTERIOR REPAIR WITH SACROSPINOUS FIXATION N/A 01/26/2022   Procedure: POSTERIOR REPAIR WITH SACROSPINOUS FIXATION;  Surgeon: Marguerita Beards, MD;  Location: [facility] [place];  Service: Gynecology;  Laterality: N/A;  total time requested is 2  hours   APPENDECTOMY  08/2006   exploratory laparatomy w/ appendectomy (ruptured) and repair bowel   BREAST BIOPSY Right 07/30/2022   MM RT BREAST BX W LOC DEV 1ST LESION IMAGE BX SPEC STEREO GUIDE 07/30/2022 [facility]   BREAST LUMPECTOMY WITH RADIOACTIVE SEED AND SENTINEL LYMPH NODE BIOPSY Right 12/11/2020   Procedure: RIGHT BREAST LUMPECTOMY WITH RADIOACTIVE SEED X2 AND RIGHT AXILLARY SENTINEL LYMPH NODE BIOPSY;  Surgeon: Emelia Loron, MD;  Location: [facility];  Service: General;  Laterality: Right;   BREAST RECONSTRUCTION Right 12/20/2020   Procedure: RIGHT ONCOPLASTIC RECONSTRUCTION;  Surgeon: Glenna Fellows, MD;  Location: [facility];  Service: Plastics;  Laterality: Right;   BREAST REDUCTION SURGERY Left 12/20/2020   Procedure: LEFT BREAST REDUCTION;  Surgeon: Glenna Fellows, MD;  Location: [facility];  Service: Plastics;  Laterality: Left;   COLONOSCOPY N/A 06/15/2016   Procedure: COLONOSCOPY;  Surgeon: Malissa Hippo, MD;  [facility];  Service: Endoscopy;  Laterality: N/A;  845   DILATION AND CURETTAGE OF UTERUS  06/03/2011   w/ suction for miscarriage   EXCISION OF BREAST BIOPSY Right 1999   INCISION AND DRAINAGE OF WOUND Right 01/10/2021   Procedure: INCISION AND DRAINAGE OF RIGHT AXILLA;  Surgeon: Glenna Fellows, MD;  Location: [facility];  Service: Plastics;  Laterality: Right;   OVUM / OOCYTE  RETRIEVAL     2010  and 2012   PERINEOPLASTY N/A 01/26/2022   Procedure: PERINEORRHAPY;  Surgeon: Marguerita Beards, MD;  Location: [facility];  Service: Gynecology;  Laterality: N/A;   PERINEOPLASTY N/A 02/19/2022   Procedure: PERINEOPLASTY revision;  Surgeon: Marguerita Beards, MD;  Location: [facility];  Service: Gynecology;  Laterality: N/A;   PORTACATH PLACEMENT Left 07/23/2020   Procedure: INSERTION PORT-A-CATH;  Surgeon: Emelia Loron, MD;   Location: [facility];  Service: General;  Laterality: Left;   THYROID LOBECTOMY Right 05/2010   -[facility]   VAGINAL HYSTERECTOMY  03/21/2019   @ [facility] by dr Mora Appl;   w/ Rogelio Seen culdoplasty   Patient Active Problem List   Diagnosis Date Noted   Prolapse of posterior vaginal wall 01/26/2022   Uterine prolapse 03/20/2021   Polyarthralgia 03/20/2021   Port-A-Cath in place 02/06/2021   Genetic testing 07/17/2020   Malignant neoplasm of upper-outer quadrant of right breast in female, estrogen receptor positive 07/12/2020   Change in bowel habits 06/12/2016   Mixed hyperlipidemia 01/07/2016   Hypothyroidism, unspecified 01/07/2016   Allergic rhinitis 04/02/2015   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    PCP: Richardean Chimera, MD  REFERRING PROVIDER: Selmer Dominion, NP  REFERRING DIAG: [phone] (ICD-10-CM) - Post-operative state  THERAPY DIAG:  Muscle weakness (generalized)  Abnormal posture  Unspecified lack of coordination  Rationale for Evaluation and Treatment: Rehabilitation  ONSET DATE: noticed at last pelvic exam in [place]  SUBJECTIVE:                                                                                                                                                                                           SUBJECTIVE STATEMENT: Pt hasn't been sexually active so unsure if internal was helpful yet but did want to do this again today.    PAIN:  Are you having pain? Yes NPRS scale: 7/10 Pain location:  posterior pelvic floor   Pain type: sharp Pain description: intermittent   Aggravating factors: only if something touches it Relieving factors: not having something touching it resolves immediately.   PRECAUTIONS: Other: breast CA - not currently getting treatments   WEIGHT BEARING RESTRICTIONS: No  FALLS:  Has patient fallen in last 6 months? No  LIVING ENVIRONMENT: Lives with: lives with their family   OCCUPATION:  PA  PLOF: Independent  PATIENT GOALS: to have no pain with sex  PERTINENT HISTORY:  She is allergic to hyaluronic acid.  10/23/2022 Muscle weakness (generalized)  Malignant neoplasm of upper-outer quadrant of right breast in female, estrogen receptor positive [facility])  Surgery: s/p  Posterior repair, sacrospinous ligament fixation, perineorrhaphy, anal sphincteroplasty on 01/26/22 S/p EUA and revision perineoplasty on 02/21/22.  Sexual abuse: No  BOWEL MOVEMENT: Pain with bowel movement: No Type of bowel movement: normal for pt Fully empty rectum: Yes:   Leakage: No Pads: No Fiber supplement: No  URINATION: Pain with urination: No Fully empty bladder: Yes:   Stream: Strong Urgency: No Frequency: not quicker than every 2 hours Leakage:  none Pads: No  INTERCOURSE: Pain with intercourse: Initial Penetration and During Penetration Ability to have vaginal penetration:  Yes: but painful Climax: not really due to pain Marinoff Scale: 2/3  PREGNANCY: Vaginal deliveries 2 Tearing Yes: 3rd deg tear posterior with first and anterior second  C-section deliveries 0 Currently pregnant No  PROLAPSE: None   OBJECTIVE:   DIAGNOSTIC FINDINGS:    COGNITION: Overall cognitive status: Within functional limits for tasks assessed     SENSATION: Light touch: Appears intact Proprioception: Appears intact  MUSCLE LENGTH: Hamstrings and adductors limited by 25%    POSTURE: rounded shoulders, forward head, and flexed trunk   LUMBARAROM/PROM:  A/PROM A/PROM  eval  Flexion Limited by 25%  Extension WFL  Right lateral flexion WFL  Left lateral flexion WFL  Right rotation WFL  Left rotation WFL   (Blank rows = not tested)  LOWER EXTREMITY ROM:  WFL  LOWER EXTREMITY MMT:  Hip abduction 3+/5 bil, all other 4/5, knees 5/5 PALPATION:   General  no TTP externally                 External Perineal Exam mild TTP at posterior vaginal opening externally                              Internal Pelvic Floor bil bulbocavernosus, at vaginal opening at clock face 8-4 locations, no TTP at deep layers except near coccyx. perineal scar tissue restriction/tenderness,   Patient confirms identification and approves PT to assess internal pelvic floor and treatment Yes  PELVIC MMT:   MMT eval  Vaginal 3/5, 8s, 10 reps  Internal Anal Sphincter   External Anal Sphincter   Puborectalis   Diastasis Recti   (Blank rows = not tested)        TONE: Pelvic floor atrophy   PROLAPSE: Not seen in hooklying   TODAY'S TREATMENT:                                                                                                                              DATE:   07/24/2022 EVAL Examination completed, findings reviewed, pt educated on POC, HEP, and self mobility at posterior vaginal opening and perineal body. Manual work completed for improved mobility and gentle stretching at posterior pelvic floor. Pt reported feeling a little better at end of session. Pt motivated to participate in PT and agreeable to attempt recommendations.    08/06/22  Manual work completed for improved mobility  and gentle stretching at 3-8 on clock face of introitus and at perineal body. Pt reported feeling a little better at end of session. Pt reported decreased pain from 7-8/10 to 5-6/10 and did demonstrate improved mobility with tissues after manual work. During pt directed in diaphragmatic breathing and relaxation techniques.  X10 pelvic tilts on soft ball for posterior pelvic floor mobility and relaxation of tissues externally Static stretching on towel roll in sitting 1 mins Educated on pelvic wand for better erogenics on self stretching at home.  PATIENT EDUCATION:  Education details: [id number] Person educated: Patient Education method: Explanation, Demonstration, Actor cues, Verbal cues, and Handouts Education comprehension: verbalized understanding and returned demonstration  HOME EXERCISE  PROGRAM: [id number]  ASSESSMENT:  CLINICAL IMPRESSION: Patient presents for treatment today. Pt session focused on manual work at vaginal opening and perineal body for decreased pain and improved tissue mobility for tolerance to medical exams and intercoursethe patient demonstrated improvement with tissue mobility at end of session and reported understanding of how to complete self stretching at home.  Pt would benefit from additional PT to further address deficits.    OBJECTIVE IMPAIRMENTS: decreased coordination, decreased endurance, decreased mobility, decreased strength, increased fascial restrictions, increased muscle spasms, impaired flexibility, improper body mechanics, postural dysfunction, and pain.   ACTIVITY LIMITATIONS: sitting and intercourse, medical assessments  with internal needs  PARTICIPATION LIMITATIONS: interpersonal relationship and community activity  PERSONAL FACTORS: 1 comorbidity: medical history  are also affecting patient's functional outcome.   REHAB POTENTIAL: Good  CLINICAL DECISION MAKING: Stable/uncomplicated  EVALUATION COMPLEXITY: Low   GOALS: Goals reviewed with patient? Yes  SHORT TERM GOALS: Target date: 5//10/24  Pt to be I with advanced HEP.  Baseline: Goal status: INITIAL  2.  Pt will report no more than 5/10 pain due to improvements in posture, strength, and muscle length  Baseline:  Goal status: INITIAL   LONG TERM GOALS: Target date: 10/23/22  Pt to be I with advanced HEP.  Baseline:  Goal status: INITIAL 2.  Pt will report no more than 2/10 pain due to improvements in posture, strength, and muscle length  Baseline:  Goal status: INITIAL  3.  Pt to report improved tolerance to vaginal penetration for improved ability to be able to climax at least 75% of the instance of intercourse.  Baseline:  Goal status: INITIAL  4.  Pt to be I with manual self stretching of perineal body/vaginal opening to improve tissue mobility and  decrease pain at home.  Baseline:  Goal status: INITIAL  5.  Pt to demonstrate at least 4/5 pelvic floor strength with ability to fully relax post contraction for improved pelvic stability and decreased strain at pelvic floor/ decrease leakage.  Baseline:  Goal status: INITIAL  6.  Pt to demonstrate at least 5/5 bil hip strength with good coordination of pelvic floor and breathing  for improved pelvic stability and functional squats without leakage.   Baseline:  Goal status: INITIAL   PLAN:  PT FREQUENCY: 1x/week  PT DURATION:  8 sessions  PLANNED INTERVENTIONS: Therapeutic exercises, Therapeutic activity, Neuromuscular re-education, Balance training, Gait training, Patient/Family education, Self Care, Aquatic Therapy, Dry Needling, Spinal mobilization, Cryotherapy, Moist heat, scar mobilization, Taping, Biofeedback, and Manual therapy  PLAN FOR NEXT SESSION: manual work at perineal mobility, scar mobility, core and hip strengthening, coordination of pelvic floor and breathing mechanics   Otelia Sergeant, PT, DPT 04/25/242:44 PM

## 2022-08-13 ENCOUNTER — Ambulatory Visit: Payer: BC Managed Care – PPO | Attending: Obstetrics and Gynecology | Admitting: Physical Therapy

## 2022-08-13 DIAGNOSIS — M6281 Muscle weakness (generalized): Secondary | ICD-10-CM | POA: Diagnosis present

## 2022-08-13 DIAGNOSIS — R279 Unspecified lack of coordination: Secondary | ICD-10-CM | POA: Insufficient documentation

## 2022-08-13 DIAGNOSIS — R293 Abnormal posture: Secondary | ICD-10-CM | POA: Insufficient documentation

## 2022-08-13 DIAGNOSIS — M62838 Other muscle spasm: Secondary | ICD-10-CM | POA: Insufficient documentation

## 2022-08-13 NOTE — Therapy (Signed)
OUTPATIENT PHYSICAL THERAPY FEMALE PELVIC EVALUATION   Patient Name: Sandy Goodwin MRN: [id number] DOB:[birth year], 52 y.o., female Today's Date: 08/13/2022  END OF SESSION:  PT End of Session - 08/13/22 1238     Visit Number 3    Date for PT Re-Evaluation 10/23/22    Authorization Type BC[employer]    PT Start Time 1234    PT Stop Time 1310    PT Time Calculation (min) 36 min    Activity Tolerance Patient tolerated treatment well    Behavior During Therapy [facility] for tasks assessed/performed             Past Medical History:  Diagnosis Date   Full incontinence-feces    History of anemia    History of cancer chemotherapy    right breast;   neo-adjuvent 07-24-2020  to 11-15-2020  and 12-05-2020  to 07-24-2021   History of external beam radiation therapy    right reast 02-04-2021  to 03-25-2021   History of gastric ulcer    remote hx -- resolved   Hypothyroidism, postsurgical    followed by pcp;     02/ 2012  s/p  right hemithyroidectomy  (follicular adenoma)   Malignant neoplasm of upper-outer quadrant of right breast in female, estrogen receptor positive (HCC) 06/2020   oncologist--- dr Pamelia Hoit;  DCIS high grade right breast , neo-adjuvant chemo completed 11-15-2020,  s/p right breast lumpectomy w/ node dissection on 12-11-2020, then chemo completed 07-24-2021 and radiation completed 03-25-2021   Polyarthralgia    PONV (postoperative nausea and vomiting)    With first surgery, none since   Prolapse of vaginal vault after hysterectomy    and posterior prolapse   RA (rheumatoid arthritis) (HCC)    rheumatologsit--- dr Dierdre Forth;  no treatment   Tachycardia    Tachycardia   Past Surgical History:  Procedure Laterality Date   ANTERIOR AND POSTERIOR REPAIR WITH SACROSPINOUS FIXATION N/A 01/26/2022   Procedure: POSTERIOR REPAIR WITH SACROSPINOUS FIXATION;  Surgeon: Marguerita Beards, MD;  Location: [facility];  Service: Gynecology;  Laterality: N/A;  total time  requested is 2 hours   APPENDECTOMY  08/2006   exploratory laparatomy w/ appendectomy (ruptured) and repair bowel   BREAST BIOPSY Right 07/30/2022   MM RT BREAST BX W LOC DEV 1ST LESION IMAGE BX SPEC STEREO GUIDE 07/30/2022 [facility]   BREAST LUMPECTOMY WITH RADIOACTIVE SEED AND SENTINEL LYMPH NODE BIOPSY Right 12/11/2020   Procedure: RIGHT BREAST LUMPECTOMY WITH RADIOACTIVE SEED X2 AND RIGHT AXILLARY SENTINEL LYMPH NODE BIOPSY;  Surgeon: Emelia Loron, MD;  Location: [facility];  Service: General;  Laterality: Right;   BREAST RECONSTRUCTION Right 12/20/2020   Procedure: RIGHT ONCOPLASTIC RECONSTRUCTION;  Surgeon: Glenna Fellows, MD;  Location: [facility];  Service: Plastics;  Laterality: Right;   BREAST REDUCTION SURGERY Left 12/20/2020   Procedure: LEFT BREAST REDUCTION;  Surgeon: Glenna Fellows, MD;  Location: [facility];  Service: Plastics;  Laterality: Left;   COLONOSCOPY N/A 06/15/2016   Procedure: COLONOSCOPY;  Surgeon: Malissa Hippo, MD;  [facility];  Service: Endoscopy;  Laterality: N/A;  845   DILATION AND CURETTAGE OF UTERUS  06/03/2011   w/ suction for miscarriage   EXCISION OF BREAST BIOPSY Right 1999   INCISION AND DRAINAGE OF WOUND Right 01/10/2021   Procedure: INCISION AND DRAINAGE OF RIGHT AXILLA;  Surgeon: Glenna Fellows, MD;  Location: [facility];  Service: Plastics;  Laterality: Right;   OVUM /  OOCYTE RETRIEVAL     2010  and 2012   PERINEOPLASTY N/A 01/26/2022   Procedure: PERINEORRHAPY;  Surgeon: Marguerita Beards, MD;  Location: [facility];  Service: Gynecology;  Laterality: N/A;   PERINEOPLASTY N/A 02/19/2022   Procedure: PERINEOPLASTY revision;  Surgeon: Marguerita Beards, MD;  Location: [facility];  Service: Gynecology;  Laterality: N/A;   PORTACATH PLACEMENT Left 07/23/2020   Procedure: INSERTION PORT-A-CATH;  Surgeon: Emelia Loron, MD;  Location:  SURGERY CENTER;  Service: General;  Laterality: Left;   THYROID LOBECTOMY Right 05/2010   @[facility]   VAGINAL HYSTERECTOMY  03/21/2019   @ [facility] by dr Mora Appl;   w/ Rogelio Seen culdoplasty   Patient Active Problem List   Diagnosis Date Noted   Prolapse of posterior vaginal wall 01/26/2022   Uterine prolapse 03/20/2021   Polyarthralgia 03/20/2021   Port-A-Cath in place 02/06/2021   Genetic testing 07/17/2020   Malignant neoplasm of upper-outer quadrant of right breast in female, estrogen receptor positive (HCC) 07/12/2020   Change in bowel habits 06/12/2016   Mixed hyperlipidemia 01/07/2016   Hypothyroidism, unspecified 01/07/2016   Allergic rhinitis 04/02/2015   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  Rationale for Evaluation and Treatment: Rehabilitation  ONSET DATE: noticed at last pelvic exam in [place]  SUBJECTIVE:                                                                                                                                                                                           SUBJECTIVE STATEMENT: Pt has tried pelvic wands once this week as It just came Tuesday. Does think manual work is helping.    PAIN:  Are you having pain? Yes NPRS scale: 5/10 Pain location:  posterior pelvic floor   Pain type: sharp Pain description: intermittent   Aggravating factors: only if something touches it Relieving factors: not having something touching it resolves immediately.   PRECAUTIONS: Other: breast CA - not currently getting treatments   WEIGHT BEARING RESTRICTIONS: No  FALLS:  Has patient fallen in last 6 months? No  LIVING ENVIRONMENT: Lives with: lives with their family   OCCUPATION: PA  PLOF: Independent  PATIENT GOALS: to have no pain  with sex  PERTINENT HISTORY:  She is allergic to hyaluronic acid.  10/23/2022 Muscle weakness (generalized)  Malignant neoplasm of upper-outer quadrant of right breast in female, estrogen receptor positive [facility])  Surgery: s/p Posterior repair, sacrospinous ligament fixation, perineorrhaphy, anal  sphincteroplasty on 01/26/22 S/p EUA and revision perineoplasty on 02/21/22.  Sexual abuse: No  BOWEL MOVEMENT: Pain with bowel movement: No Type of bowel movement: normal for pt Fully empty rectum: Yes:   Leakage: No Pads: No Fiber supplement: No  URINATION: Pain with urination: No Fully empty bladder: Yes:   Stream: Strong Urgency: No Frequency: not quicker than every 2 hours Leakage:  none Pads: No  INTERCOURSE: Pain with intercourse: Initial Penetration and During Penetration Ability to have vaginal penetration:  Yes: but painful Climax: not really due to pain Marinoff Scale: 2/3  PREGNANCY: Vaginal deliveries 2 Tearing Yes: 3rd deg tear posterior with first and anterior second  C-section deliveries 0 Currently pregnant No  PROLAPSE: None   OBJECTIVE:   DIAGNOSTIC FINDINGS:    COGNITION: Overall cognitive status: Within functional limits for tasks assessed     SENSATION: Light touch: Appears intact Proprioception: Appears intact  MUSCLE LENGTH: Hamstrings and adductors limited by 25%    POSTURE: rounded shoulders, forward head, and flexed trunk   LUMBARAROM/PROM:  A/PROM A/PROM  eval  Flexion Limited by 25%  Extension WFL  Right lateral flexion WFL  Left lateral flexion WFL  Right rotation WFL  Left rotation WFL   (Blank rows = not tested)  LOWER EXTREMITY ROM:  WFL  LOWER EXTREMITY MMT:  Hip abduction 3+/5 bil, all other 4/5, knees 5/5 PALPATION:   General  no TTP externally                 External Perineal Exam mild TTP at posterior vaginal opening externally                             Internal Pelvic Floor bil bulbocavernosus, at  vaginal opening at clock face 8-4 locations, no TTP at deep layers except near coccyx. perineal scar tissue restriction/tenderness,   Patient confirms identification and approves PT to assess internal pelvic floor and treatment Yes  PELVIC MMT:   MMT eval  Vaginal 3/5, 8s, 10 reps  Internal Anal Sphincter   External Anal Sphincter   Puborectalis   Diastasis Recti   (Blank rows = not tested)        TONE: Pelvic floor atrophy   PROLAPSE: Not seen in hooklying   TODAY'S TREATMENT:                                                                                                                              DATE:    08/06/22  Manual work completed for improved mobility and gentle stretching at 3-8 on clock face of introitus and at perineal body. Pt reported feeling a little better at end of session. Pt reported decreased pain from 7-8/10 to 5-6/10 and did demonstrate improved mobility with tissues after manual work. During pt directed in diaphragmatic breathing and relaxation techniques.  X10 pelvic tilts on soft ball for posterior pelvic floor mobility and  relaxation of tissues externally Static stretching on towel roll in sitting 1 mins Educated on pelvic wand for better erogenics on self stretching at home.  08/13/22  Manual work completed for improved mobility and gentle stretching at 3-8 on clock face of introitus and at perineal body and with ant/post perineal body stretching pt demonstrated improved mobility with tissues after manual work. During pt directed in diaphragmatic breathing and relaxation techniques.   PATIENT EDUCATION:  Education details: [id number] Person educated: Patient Education method: Explanation, Demonstration, Actor cues, Verbal cues, and Handouts Education comprehension: verbalized understanding and returned demonstration  HOME EXERCISE PROGRAM: [id number]  ASSESSMENT:  CLINICAL IMPRESSION: Patient presents for treatment today. Pt session focused on  manual work at vaginal opening and perineal body for decreased pain and improved tissue mobility for tolerance to medical exams and intercoursethe patient demonstrated improvement with tissue mobility at end of session and reported understanding of how to complete self stretching at home.  Pt would benefit from additional PT to further address deficits.    OBJECTIVE IMPAIRMENTS: decreased coordination, decreased endurance, decreased mobility, decreased strength, increased fascial restrictions, increased muscle spasms, impaired flexibility, improper body mechanics, postural dysfunction, and pain.   ACTIVITY LIMITATIONS: sitting and intercourse, medical assessments  with internal needs  PARTICIPATION LIMITATIONS: interpersonal relationship and community activity  PERSONAL FACTORS: 1 comorbidity: medical history  are also affecting patient's functional outcome.   REHAB POTENTIAL: Good  CLINICAL DECISION MAKING: Stable/uncomplicated  EVALUATION COMPLEXITY: Low   GOALS: Goals reviewed with patient? Yes  SHORT TERM GOALS: Target date: 5//10/24  Pt to be I with advanced HEP.  Baseline: Goal status: INITIAL  2.  Pt will report no more than 5/10 pain due to improvements in posture, strength, and muscle length  Baseline:  Goal status: INITIAL   LONG TERM GOALS: Target date: 10/23/22  Pt to be I with advanced HEP.  Baseline:  Goal status: INITIAL 2.  Pt will report no more than 2/10 pain due to improvements in posture, strength, and muscle length  Baseline:  Goal status: INITIAL  3.  Pt to report improved tolerance to vaginal penetration for improved ability to be able to climax at least 75% of the instance of intercourse.  Baseline:  Goal status: INITIAL  4.  Pt to be I with manual self stretching of perineal body/vaginal opening to improve tissue mobility and decrease pain at home.  Baseline:  Goal status: INITIAL  5.  Pt to demonstrate at least 4/5 pelvic floor strength with  ability to fully relax post contraction for improved pelvic stability and decreased strain at pelvic floor/ decrease leakage.  Baseline:  Goal status: INITIAL  6.  Pt to demonstrate at least 5/5 bil hip strength with good coordination of pelvic floor and breathing  for improved pelvic stability and functional squats without leakage.   Baseline:  Goal status: INITIAL   PLAN:  PT FREQUENCY: 1x/week  PT DURATION:  8 sessions  PLANNED INTERVENTIONS: Therapeutic exercises, Therapeutic activity, Neuromuscular re-education, Balance training, Gait training, Patient/Family education, Self Care, Aquatic Therapy, Dry Needling, Spinal mobilization, Cryotherapy, Moist heat, scar mobilization, Taping, Biofeedback, and Manual therapy  PLAN FOR NEXT SESSION: manual work at perineal mobility, scar mobility, core and hip strengthening, coordination of pelvic floor and breathing mechanics   Otelia Sergeant, PT, DPT 05/02/242:31 PM

## 2022-08-20 ENCOUNTER — Ambulatory Visit: Payer: BC Managed Care – PPO | Admitting: Physical Therapy

## 2022-08-20 DIAGNOSIS — M62838 Other muscle spasm: Secondary | ICD-10-CM

## 2022-08-20 DIAGNOSIS — R293 Abnormal posture: Secondary | ICD-10-CM

## 2022-08-20 DIAGNOSIS — M6281 Muscle weakness (generalized): Secondary | ICD-10-CM

## 2022-08-20 DIAGNOSIS — R279 Unspecified lack of coordination: Secondary | ICD-10-CM

## 2022-08-20 NOTE — Therapy (Signed)
OUTPATIENT PHYSICAL THERAPY FEMALE PELVIC EVALUATION   Patient Name: Sandy Goodwin MRN: [id number] DOB:17-Jun-1970, 52 y.o., female Today's Date: 08/20/2022  END OF SESSION:  PT End of Session - 08/20/22 1400     Visit Number 4    Date for PT Re-Evaluation 10/23/22    Authorization Type [employer]    PT Start Time 1400    PT Stop Time 1438    PT Time Calculation (min) 38 min    Activity Tolerance Patient tolerated treatment well    Behavior During Therapy [facility] for tasks assessed/performed             Past Medical History:  Diagnosis Date   Full incontinence-feces    History of anemia    History of cancer chemotherapy    right breast;   neo-adjuvent 07-24-2020  to 11-15-2020  and 12-05-2020  to 07-24-2021   History of external beam radiation therapy    right reast 02-04-2021  to 03-25-2021   History of gastric ulcer    remote hx -- resolved   Hypothyroidism, postsurgical    followed by pcp;     02/ 2012  s/p  right hemithyroidectomy  (follicular adenoma)   Malignant neoplasm of upper-outer quadrant of right breast in female, estrogen receptor positive (HCC) 06/2020   oncologist--- dr Pamelia Hoit;  DCIS high grade right breast , neo-adjuvant chemo completed 11-15-2020,  s/p right breast lumpectomy w/ node dissection on 12-11-2020, then chemo completed 07-24-2021 and radiation completed 03-25-2021   Polyarthralgia    PONV (postoperative nausea and vomiting)    With first surgery, none since   Prolapse of vaginal vault after hysterectomy    and posterior prolapse   RA (rheumatoid arthritis) (HCC)    rheumatologsit--- dr Dierdre Forth;  no treatment   Tachycardia    Tachycardia   Past Surgical History:  Procedure Laterality Date   ANTERIOR AND POSTERIOR REPAIR WITH SACROSPINOUS FIXATION N/A 01/26/2022   Procedure: POSTERIOR REPAIR WITH SACROSPINOUS FIXATION;  Surgeon: Marguerita Beards, MD;  Location: [facility];  Service: Gynecology;  Laterality: N/A;  total time  requested is 2 hours   APPENDECTOMY  08/2006   exploratory laparatomy w/ appendectomy (ruptured) and repair bowel   BREAST BIOPSY Right 07/30/2022   MM RT BREAST BX W LOC DEV 1ST LESION IMAGE BX SPEC STEREO GUIDE 07/30/2022 [facility]   BREAST LUMPECTOMY WITH RADIOACTIVE SEED AND SENTINEL LYMPH NODE BIOPSY Right 12/11/2020   Procedure: RIGHT BREAST LUMPECTOMY WITH RADIOACTIVE SEED X2 AND RIGHT AXILLARY SENTINEL LYMPH NODE BIOPSY;  Surgeon: Emelia Loron, MD;  Location: [facility];  Service: General;  Laterality: Right;   BREAST RECONSTRUCTION Right 12/20/2020   Procedure: RIGHT ONCOPLASTIC RECONSTRUCTION;  Surgeon: Glenna Fellows, MD;  Location: [facility];  Service: Plastics;  Laterality: Right;   BREAST REDUCTION SURGERY Left 12/20/2020   Procedure: LEFT BREAST REDUCTION;  Surgeon: Glenna Fellows, MD;  Location: [facility];  Service: Plastics;  Laterality: Left;   COLONOSCOPY N/A 06/15/2016   Procedure: COLONOSCOPY;  Surgeon: Malissa Hippo, MD;  [facility];  Service: Endoscopy;  Laterality: N/A;  845   DILATION AND CURETTAGE OF UTERUS  06/03/2011   w/ suction for miscarriage   EXCISION OF BREAST BIOPSY Right 1999   INCISION AND DRAINAGE OF WOUND Right 01/10/2021   Procedure: INCISION AND DRAINAGE OF RIGHT AXILLA;  Surgeon: Glenna Fellows, MD;  Location:  SURGERY CENTER;  Service: Plastics;  Laterality: Right;   OVUM /  OOCYTE RETRIEVAL     2010  and 2012   PERINEOPLASTY N/A 01/26/2022   Procedure: PERINEORRHAPY;  Surgeon: Marguerita Beards, MD;  Location: [facility];  Service: Gynecology;  Laterality: N/A;   PERINEOPLASTY N/A 02/19/2022   Procedure: PERINEOPLASTY revision;  Surgeon: Marguerita Beards, MD;  Location: [facility];  Service: Gynecology;  Laterality: N/A;   PORTACATH PLACEMENT Left 07/23/2020   Procedure: INSERTION PORT-A-CATH;  Surgeon: Emelia Loron, MD;  Location: [facility];  Service: General;  Laterality: Left;   THYROID LOBECTOMY Right 05/2010   @[facility]   VAGINAL HYSTERECTOMY  03/21/2019   @ [facility] by dr Mora Appl;   w/ Rogelio Seen culdoplasty   Patient Active Problem List   Diagnosis Date Noted   Prolapse of posterior vaginal wall 01/26/2022   Uterine prolapse 03/20/2021   Polyarthralgia 03/20/2021   Port-A-Cath in place 02/06/2021   Genetic testing 07/17/2020   Malignant neoplasm of upper-outer quadrant of right breast in female, estrogen receptor positive (HCC) 07/12/2020   Change in bowel habits 06/12/2016   Mixed hyperlipidemia 01/07/2016   Hypothyroidism, unspecified 01/07/2016   Allergic rhinitis 04/02/2015   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    PCP: Richardean Chimera, MD  REFERRING PROVIDER: Selmer Dominion, NP  REFERRING DIAG: [phone] (ICD-10-CM) - Post-operative state  THERAPY DIAG:  Other muscle spasm  Abnormal posture  Muscle weakness (generalized)  Unspecified lack of coordination  Rationale for Evaluation and Treatment: Rehabilitation  ONSET DATE: noticed at last pelvic exam in [place]  SUBJECTIVE:                                                                                                                                                                                           SUBJECTIVE STATEMENT: Pt reports she has been using the wand at home, and this seems to help pain. Attempted the wand before intercourse and this helped with vaginal penetration tolerance. Initially  attempted without wand use, unable to tolerate, then improved to much more tolerable and once penetration complete improved.    PAIN:  Are you having pain? Yes NPRS scale: 6/10 Pain location:  posterior pelvic floor   Pain type: sharp Pain description: intermittent   Aggravating factors: only if something touches it Relieving factors: not having something touching it resolves immediately.    PRECAUTIONS: Other: breast CA - not currently getting treatments   WEIGHT BEARING RESTRICTIONS: No  FALLS:  Has patient fallen in last 6 months? No  LIVING ENVIRONMENT: Lives with: lives with their family   OCCUPATION: PA  PLOF: Independent  PATIENT GOALS: to have no pain with  sex  PERTINENT HISTORY:  She is allergic to hyaluronic acid.  10/23/2022 Muscle weakness (generalized)  Malignant neoplasm of upper-outer quadrant of right breast in female, estrogen receptor positive [facility])  Surgery: s/p Posterior repair, sacrospinous ligament fixation, perineorrhaphy, anal sphincteroplasty on 01/26/22 S/p EUA and revision perineoplasty on 02/21/22.  Sexual abuse: No  BOWEL MOVEMENT: Pain with bowel movement: No Type of bowel movement: normal for pt Fully empty rectum: Yes:   Leakage: No Pads: No Fiber supplement: No  URINATION: Pain with urination: No Fully empty bladder: Yes:   Stream: Strong Urgency: No Frequency: not quicker than every 2 hours Leakage:  none Pads: No  INTERCOURSE: Pain with intercourse: Initial Penetration and During Penetration Ability to have vaginal penetration:  Yes: but painful Climax: not really due to pain Marinoff Scale: 2/3  PREGNANCY: Vaginal deliveries 2 Tearing Yes: 3rd deg tear posterior with first and anterior second  C-section deliveries 0 Currently pregnant No  PROLAPSE: None   OBJECTIVE:   DIAGNOSTIC FINDINGS:    COGNITION: Overall cognitive status: Within functional limits for tasks assessed     SENSATION: Light touch: Appears intact Proprioception: Appears intact  MUSCLE LENGTH: Hamstrings and adductors limited by 25%    POSTURE: rounded shoulders, forward head, and flexed trunk   LUMBARAROM/PROM:  A/PROM A/PROM  eval  Flexion Limited by 25%  Extension WFL  Right lateral flexion WFL  Left lateral flexion WFL  Right rotation WFL  Left rotation WFL   (Blank rows = not tested)  LOWER EXTREMITY  ROM:  WFL  LOWER EXTREMITY MMT:  Hip abduction 3+/5 bil, all other 4/5, knees 5/5 PALPATION:   General  no TTP externally                 External Perineal Exam mild TTP at posterior vaginal opening externally                             Internal Pelvic Floor bil bulbocavernosus, at vaginal opening at clock face 8-4 locations, no TTP at deep layers except near coccyx. perineal scar tissue restriction/tenderness,   Patient confirms identification and approves PT to assess internal pelvic floor and treatment Yes  PELVIC MMT:   MMT eval  Vaginal 3/5, 8s, 10 reps  Internal Anal Sphincter   External Anal Sphincter   Puborectalis   Diastasis Recti   (Blank rows = not tested)        TONE: Pelvic floor atrophy   PROLAPSE: Not seen in hooklying   TODAY'S TREATMENT:                                                                                                                              DATE:    08/13/22  Manual work completed for improved mobility and gentle stretching at 3-8 on clock face of introitus and at perineal body and with ant/post perineal body stretching pt demonstrated improved  mobility with tissues after manual work. During pt directed in diaphragmatic breathing and relaxation techniques.   08/20/22: Manual - manual work at posterior vaginal opening with most tension directly at opening at slightly Rt of midline, midline, and Lt side of opening. Gentle stretching in posterior direction with pt tolerating well today, with pt also tolerating slight bil stretching as well for improved tolerance to muscle stretching together for vaginal penetration during intercourse. Pain didn't increase throughout.  Therapeutic exercise:  Butterfly stretch 2x30s Single knee to chest 2x30s Standing hip hinge with hip IR 2x30s Deep pelvic squat 2x1 min  PATIENT EDUCATION:  Education details: [id number] Person educated: Patient Education method: Explanation, Demonstration, Tactile  cues, Verbal cues, and Handouts Education comprehension: verbalized understanding and returned demonstration  HOME EXERCISE PROGRAM: [id number]  ASSESSMENT:  CLINICAL IMPRESSION: Patient presents for treatment today. Pt session focused on manual work at vaginal opening for decreased pain and improved tissue mobility for tolerance to medical exams and intercourse. Patient demonstrated improvement with tissue mobility at end of session and reports feeling like pain is less with sex now but still needs to use wand first..  Pt would benefit from additional PT to further address deficits.    OBJECTIVE IMPAIRMENTS: decreased coordination, decreased endurance, decreased mobility, decreased strength, increased fascial restrictions, increased muscle spasms, impaired flexibility, improper body mechanics, postural dysfunction, and pain.   ACTIVITY LIMITATIONS: sitting and intercourse, medical assessments  with internal needs  PARTICIPATION LIMITATIONS: interpersonal relationship and community activity  PERSONAL FACTORS: 1 comorbidity: medical history  are also affecting patient's functional outcome.   REHAB POTENTIAL: Good  CLINICAL DECISION MAKING: Stable/uncomplicated  EVALUATION COMPLEXITY: Low   GOALS: Goals reviewed with patient? Yes  SHORT TERM GOALS: Target date: 5//10/24  Pt to be I with advanced HEP.  Baseline: Goal status: INITIAL  2.  Pt will report no more than 5/10 pain due to improvements in posture, strength, and muscle length  Baseline:  Goal status: INITIAL   LONG TERM GOALS: Target date: 10/23/22  Pt to be I with advanced HEP.  Baseline:  Goal status: INITIAL 2.  Pt will report no more than 2/10 pain due to improvements in posture, strength, and muscle length  Baseline:  Goal status: INITIAL  3.  Pt to report improved tolerance to vaginal penetration for improved ability to be able to climax at least 75% of the instance of intercourse.  Baseline:  Goal status:  INITIAL  4.  Pt to be I with manual self stretching of perineal body/vaginal opening to improve tissue mobility and decrease pain at home.  Baseline:  Goal status: INITIAL  5.  Pt to demonstrate at least 4/5 pelvic floor strength with ability to fully relax post contraction for improved pelvic stability and decreased strain at pelvic floor/ decrease leakage.  Baseline:  Goal status: INITIAL  6.  Pt to demonstrate at least 5/5 bil hip strength with good coordination of pelvic floor and breathing  for improved pelvic stability and functional squats without leakage.   Baseline:  Goal status: INITIAL   PLAN:  PT FREQUENCY: 1x/week  PT DURATION:  8 sessions  PLANNED INTERVENTIONS: Therapeutic exercises, Therapeutic activity, Neuromuscular re-education, Balance training, Gait training, Patient/Family education, Self Care, Aquatic Therapy, Dry Needling, Spinal mobilization, Cryotherapy, Moist heat, scar mobilization, Taping, Biofeedback, and Manual therapy  PLAN FOR NEXT SESSION: manual work at perineal mobility, scar mobility, core and hip strengthening, coordination of pelvic floor and breathing mechanics   Otelia Sergeant, PT, DPT 05/09/242:44 PM

## 2022-08-27 ENCOUNTER — Ambulatory Visit: Payer: BC Managed Care – PPO | Admitting: Physical Therapy

## 2022-08-27 DIAGNOSIS — R279 Unspecified lack of coordination: Secondary | ICD-10-CM

## 2022-08-27 DIAGNOSIS — M62838 Other muscle spasm: Secondary | ICD-10-CM

## 2022-08-27 DIAGNOSIS — R293 Abnormal posture: Secondary | ICD-10-CM

## 2022-08-27 NOTE — Therapy (Signed)
OUTPATIENT PHYSICAL THERAPY FEMALE PELVIC TREATMENT   Patient Name: Sandy Goodwin MRN: [id number] DOB:12/18/1970, 52 y.o., female Today's Date: 08/27/2022  END OF SESSION:  PT End of Session - 08/27/22 1404     Visit Number 5    Date for PT Re-Evaluation 10/23/22    Authorization Type [employer]    PT Start Time 1402    PT Stop Time 1442    PT Time Calculation (min) 40 min    Activity Tolerance Patient tolerated treatment well    Behavior During Therapy [facility] for tasks assessed/performed             Past Medical History:  Diagnosis Date   Full incontinence-feces    History of anemia    History of cancer chemotherapy    right breast;   neo-adjuvent 07-24-2020  to 11-15-2020  and 12-05-2020  to 07-24-2021   History of external beam radiation therapy    right reast 02-04-2021  to 03-25-2021   History of gastric ulcer    remote hx -- resolved   Hypothyroidism, postsurgical    followed by pcp;     02/ 2012  s/p  right hemithyroidectomy  (follicular adenoma)   Malignant neoplasm of upper-outer quadrant of right breast in female, estrogen receptor positive (HCC) 06/2020   oncologist--- dr Pamelia Hoit;  DCIS high grade right breast , neo-adjuvant chemo completed 11-15-2020,  s/p right breast lumpectomy w/ node dissection on 12-11-2020, then chemo completed 07-24-2021 and radiation completed 03-25-2021   Polyarthralgia    PONV (postoperative nausea and vomiting)    With first surgery, none since   Prolapse of vaginal vault after hysterectomy    and posterior prolapse   RA (rheumatoid arthritis) (HCC)    rheumatologsit--- dr Dierdre Forth;  no treatment   Tachycardia    Tachycardia   Past Surgical History:  Procedure Laterality Date   ANTERIOR AND POSTERIOR REPAIR WITH SACROSPINOUS FIXATION N/A 01/26/2022   Procedure: POSTERIOR REPAIR WITH SACROSPINOUS FIXATION;  Surgeon: Marguerita Beards, MD;  Location: [facility];  Service: Gynecology;  Laterality: N/A;  total time  requested is 2 hours   APPENDECTOMY  08/2006   exploratory laparatomy w/ appendectomy (ruptured) and repair bowel   BREAST BIOPSY Right 07/30/2022   MM RT BREAST BX W LOC DEV 1ST LESION IMAGE BX SPEC STEREO GUIDE 07/30/2022 [facility]   BREAST LUMPECTOMY WITH RADIOACTIVE SEED AND SENTINEL LYMPH NODE BIOPSY Right 12/11/2020   Procedure: RIGHT BREAST LUMPECTOMY WITH RADIOACTIVE SEED X2 AND RIGHT AXILLARY SENTINEL LYMPH NODE BIOPSY;  Surgeon: Emelia Loron, MD;  Location: [facility];  Service: General;  Laterality: Right;   BREAST RECONSTRUCTION Right 12/20/2020   Procedure: RIGHT ONCOPLASTIC RECONSTRUCTION;  Surgeon: Glenna Fellows, MD;  Location: [facility];  Service: Plastics;  Laterality: Right;   BREAST REDUCTION SURGERY Left 12/20/2020   Procedure: LEFT BREAST REDUCTION;  Surgeon: Glenna Fellows, MD;  Location: [facility];  Service: Plastics;  Laterality: Left;   COLONOSCOPY N/A 06/15/2016   Procedure: COLONOSCOPY;  Surgeon: Malissa Hippo, MD;  [facility];  Service: Endoscopy;  Laterality: N/A;  845   DILATION AND CURETTAGE OF UTERUS  06/03/2011   w/ suction for miscarriage   EXCISION OF BREAST BIOPSY Right 1999   INCISION AND DRAINAGE OF WOUND Right 01/10/2021   Procedure: INCISION AND DRAINAGE OF RIGHT AXILLA;  Surgeon: Glenna Fellows, MD;  Location: [facility];  Service: Plastics;  Laterality: Right;   OVUM /  OOCYTE RETRIEVAL     2010  and 2012   PERINEOPLASTY N/A 01/26/2022   Procedure: PERINEORRHAPY;  Surgeon: Marguerita Beards, MD;  Location: [facility];  Service: Gynecology;  Laterality: N/A;   PERINEOPLASTY N/A 02/19/2022   Procedure: PERINEOPLASTY revision;  Surgeon: Marguerita Beards, MD;  Location: [facility];  Service: Gynecology;  Laterality: N/A;   PORTACATH PLACEMENT Left 07/23/2020   Procedure: INSERTION PORT-A-CATH;  Surgeon: Emelia Loron, MD;  Location:  SUR[facility]ERY C[facility];  Service: General;  Laterality: Left;   THYROID LOBECTOMY Right 05/2010   @[facility]   VAGINAL HYSTERECTOMY  03/21/2019   @ [facility] by dr Mora Appl;   w/ Rogelio Seen culdoplasty   Patient Active Problem List   Diagnosis Date Noted   Prolapse of posterior vaginal wall 01/26/2022   Uterine prolapse 03/20/2021   Polyarthralgia 03/20/2021   Port-A-Cath in place 02/06/2021   Genetic testing 07/17/2020   Malignant neoplasm of upper-outer quadrant of right breast in female, estrogen receptor positive (HCC) 07/12/2020   Change in bowel habits 06/12/2016   Mixed hyperlipidemia 01/07/2016   Hypothyroidism, unspecified 01/07/2016   Allergic rhinitis 04/02/2015   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    PCP: Richardean Chimera, MD  REFERRING PROVIDER: Selmer Dominion, NP  REFERRING DIAG: [phone] (ICD-10-CM) - Post-operative state  THERAPY DIAG:  Other muscle spasm  Abnormal posture  Unspecified lack of coordination  Rationale for Evaluation and Treatment: Rehabilitation  ONSET DATE: noticed at last pelvic exam in [place]  SUBJECTIVE:                                                                                                                                                                                           SUBJECTIVE STATEMENT: Pt has not had intercourse since last treatment so unsure of a change. However does endorse improvement with the deeper pelvic pain internally and doesn't bother her as much now.   PAIN:  Are you having pain? Yes NPRS scale: 6/10 Pain location:  posterior pelvic floor   Pain type: sharp Pain description: intermittent   Aggravating factors: only if something touches it Relieving factors: not having something touching it resolves immediately.   PRECAUTIONS: Other: breast CA - not currently getting treatments   WEIGHT BEARING RESTRICTIONS: No  FALLS:  Has patient fallen in last 6 months?  No  LIVING ENVIRONMENT: Lives with: lives with their family   OCCUPATION: PA  PLOF: Independent  PATIENT GOALS: to have no pain with sex  PERTINENT HISTORY:  She is allergic to hyaluronic acid.  10/23/2022 Muscle weakness (generalized)  Malignant neoplasm of upper-outer quadrant of  right breast in female, estrogen receptor positive [facility])  Surgery: s/p Posterior repair, sacrospinous ligament fixation, perineorrhaphy, anal sphincteroplasty on 01/26/22 S/p EUA and revision perineoplasty on 02/21/22.  Sexual abuse: No  BOWEL MOVEMENT: Pain with bowel movement: No Type of bowel movement: normal for pt Fully empty rectum: Yes:   Leakage: No Pads: No Fiber supplement: No  URINATION: Pain with urination: No Fully empty bladder: Yes:   Stream: Strong Urgency: No Frequency: not quicker than every 2 hours Leakage:  none Pads: No  INTERCOURSE: Pain with intercourse: Initial Penetration and During Penetration Ability to have vaginal penetration:  Yes: but painful Climax: not really due to pain Marinoff Scale: 2/3  PREGNANCY: Vaginal deliveries 2 Tearing Yes: 3rd deg tear posterior with first and anterior second  C-section deliveries 0 Currently pregnant No  PROLAPSE: None   OBJECTIVE:   DIAGNOSTIC FINDINGS:    COGNITION: Overall cognitive status: Within functional limits for tasks assessed     SENSATION: Light touch: Appears intact Proprioception: Appears intact  MUSCLE LENGTH: Hamstrings and adductors limited by 25%    POSTURE: rounded shoulders, forward head, and flexed trunk   LUMBARAROM/PROM:  A/PROM A/PROM  eval  Flexion Limited by 25%  Extension WFL  Right lateral flexion WFL  Left lateral flexion WFL  Right rotation WFL  Left rotation WFL   (Blank rows = not tested)  LOWER EXTREMITY ROM:  WFL  LOWER EXTREMITY MMT:  Hip abduction 3+/5 bil, all other 4/5, knees 5/5 PALPATION:   General  no TTP externally                 External  Perineal Exam mild TTP at posterior vaginal opening externally                             Internal Pelvic Floor bil bulbocavernosus, at vaginal opening at clock face 8-4 locations, no TTP at deep layers except near coccyx. perineal scar tissue restriction/tenderness,   Patient confirms identification and approves PT to assess internal pelvic floor and treatment Yes  PELVIC MMT:   MMT eval  Vaginal 3/5, 8s, 10 reps  Internal Anal Sphincter   External Anal Sphincter   Puborectalis   Diastasis Recti   (Blank rows = not tested)        TONE: Pelvic floor atrophy   PROLAPSE: Not seen in hooklying   TODAY'S TREATMENT:                                                                                                                              DATE: 08/27/22  Manual - manual work at posterior vaginal opening with most tension directly at opening at slightly Lt side of opening. Gentle stretching in posterior direction with pt tolerating well today, with pt also tolerating slight bil stretching as well for improved tolerance to muscle stretching together for vaginal penetration during intercourse. Gentle stretching also along  pubococcygeus with tension and trigger points noted there as well. Good release noted. Pain didn't increase throughout.    PATIENT EDUCATION:  Education details: [id number] Person educated: Patient Education method: Explanation, Demonstration, Actor cues, Verbal cues, and Handouts Education comprehension: verbalized understanding and returned demonstration  HOME EXERCISE PROGRAM: [id number]  ASSESSMENT:  CLINICAL IMPRESSION: Patient presents for treatment today. Pt session focused on manual work at vaginal opening for decreased pain and improved tissue mobility for tolerance to medical exams and intercourse. Great improvement  demonstrated with tissue mobility at end of session and reports feeling much less tenderness with manual work today, pleased with progress. Pt  also reports her pain in deeper pelvic floor is also much better, only bothering her inconsistently and much less often.   Pt would benefit from additional PT to further address deficits.    OBJECTIVE IMPAIRMENTS: decreased coordination, decreased endurance, decreased mobility, decreased strength, increased fascial restrictions, increased muscle spasms, impaired flexibility, improper body mechanics, postural dysfunction, and pain.   ACTIVITY LIMITATIONS: sitting and intercourse, medical assessments  with internal needs  PARTICIPATION LIMITATIONS: interpersonal relationship and community activity  PERSONAL FACTORS: 1 comorbidity: medical history  are also affecting patient's functional outcome.   REHAB POTENTIAL: Good  CLINICAL DECISION MAKING: Stable/uncomplicated  EVALUATION COMPLEXITY: Low   GOALS: Goals reviewed with patient? Yes  SHORT TERM GOALS: Target date: 5//10/24  Pt to be I with advanced HEP.  Baseline: Goal status: INITIAL  2.  Pt will report no more than 5/10 pain due to improvements in posture, strength, and muscle length  Baseline:  Goal status: INITIAL   LONG TERM GOALS: Target date: 10/23/22  Pt to be I with advanced HEP.  Baseline:  Goal status: INITIAL 2.  Pt will report no more than 2/10 pain due to improvements in posture, strength, and muscle length  Baseline:  Goal status: INITIAL  3.  Pt to report improved tolerance to vaginal penetration for improved ability to be able to climax at least 75% of the instance of intercourse.  Baseline:  Goal status: INITIAL  4.  Pt to be I with manual self stretching of perineal body/vaginal opening to improve tissue mobility and decrease pain at home.  Baseline:  Goal status: INITIAL  5.  Pt to demonstrate at least 4/5 pelvic floor strength with ability to fully relax post contraction for improved pelvic stability and decreased strain at pelvic floor/ decrease leakage.  Baseline:  Goal status: INITIAL  6.  Pt  to demonstrate at least 5/5 bil hip strength with good coordination of pelvic floor and breathing  for improved pelvic stability and functional squats without leakage.   Baseline:  Goal status: INITIAL   PLAN:  PT FREQUENCY: 1x/week  PT DURATION:  8 sessions  PLANNED INTERVENTIONS: Therapeutic exercises, Therapeutic activity, Neuromuscular re-education, Balance training, Gait training, Patient/Family education, Self Care, Aquatic Therapy, Dry Needling, Spinal mobilization, Cryotherapy, Moist heat, scar mobilization, Taping, Biofeedback, and Manual therapy  PLAN FOR NEXT SESSION: manual work at perineal mobility, scar mobility, core and hip strengthening, coordination of pelvic floor and breathing mechanics   Otelia Sergeant, PT, DPT 05/16/242:45 PM

## 2022-09-03 ENCOUNTER — Ambulatory Visit: Payer: BC Managed Care – PPO | Admitting: Physical Therapy

## 2022-09-03 DIAGNOSIS — M62838 Other muscle spasm: Secondary | ICD-10-CM

## 2022-09-03 DIAGNOSIS — M6281 Muscle weakness (generalized): Secondary | ICD-10-CM

## 2022-09-03 DIAGNOSIS — R279 Unspecified lack of coordination: Secondary | ICD-10-CM

## 2022-09-03 NOTE — Therapy (Signed)
OUTPATIENT PHYSICAL THERAPY FEMALE PELVIC TREATMENT   Patient Name: Sandy Goodwin MRN: [id number] DOB:07-23-1970, 52 y.o., female Today's Date: 09/03/2022  END OF SESSION:  PT End of Session - 09/03/22 1358     Visit Number 6    Date for PT Re-Evaluation 10/23/22    Authorization Type [employer]    PT Start Time 1400    PT Stop Time 1439    PT Time Calculation (min) 39 min    Activity Tolerance Patient tolerated treatment well    Behavior During Therapy [facility] for tasks assessed/performed             Past Medical History:  Diagnosis Date   Full incontinence-feces    History of anemia    History of cancer chemotherapy    right breast;   neo-adjuvent 07-24-2020  to 11-15-2020  and 12-05-2020  to 07-24-2021   History of external beam radiation therapy    right reast 02-04-2021  to 03-25-2021   History of gastric ulcer    remote hx -- resolved   Hypothyroidism, postsurgical    followed by pcp;     02/ 2012  s/p  right hemithyroidectomy  (follicular adenoma)   Malignant neoplasm of upper-outer quadrant of right breast in female, estrogen receptor positive (HCC) 06/2020   oncologist--- dr Pamelia Hoit;  DCIS high grade right breast , neo-adjuvant chemo completed 11-15-2020,  s/p right breast lumpectomy w/ node dissection on 12-11-2020, then chemo completed 07-24-2021 and radiation completed 03-25-2021   Polyarthralgia    PONV (postoperative nausea and vomiting)    With first surgery, none since   Prolapse of vaginal vault after hysterectomy    and posterior prolapse   RA (rheumatoid arthritis) (HCC)    rheumatologsit--- dr Dierdre Forth;  no treatment   Tachycardia    Tachycardia   Past Surgical History:  Procedure Laterality Date   ANTERIOR AND POSTERIOR REPAIR WITH SACROSPINOUS FIXATION N/A 01/26/2022   Procedure: POSTERIOR REPAIR WITH SACROSPINOUS FIXATION;  Surgeon: Marguerita Beards, MD;  Location: [facility];  Service: Gynecology;  Laterality: N/A;  total time  requested is 2 hours   APPENDECTOMY  08/2006   exploratory laparatomy w/ appendectomy (ruptured) and repair bowel   BREAST BIOPSY Right 07/30/2022   MM RT BREAST BX W LOC DEV 1ST LESION IMAGE BX SPEC STEREO GUIDE 07/30/2022 [facility]   BREAST LUMPECTOMY WITH RADIOACTIVE SEED AND SENTINEL LYMPH NODE BIOPSY Right 12/11/2020   Procedure: RIGHT BREAST LUMPECTOMY WITH RADIOACTIVE SEED X2 AND RIGHT AXILLARY SENTINEL LYMPH NODE BIOPSY;  Surgeon: Emelia Loron, MD;  Location: [facility];  Service: General;  Laterality: Right;   BREAST RECONSTRUCTION Right 12/20/2020   Procedure: RIGHT ONCOPLASTIC RECONSTRUCTION;  Surgeon: Glenna Fellows, MD;  Location: [facility];  Service: Plastics;  Laterality: Right;   BREAST REDUCTION SURGERY Left 12/20/2020   Procedure: LEFT BREAST REDUCTION;  Surgeon: Glenna Fellows, MD;  Location: [facility];  Service: Plastics;  Laterality: Left;   COLONOSCOPY N/A 06/15/2016   Procedure: COLONOSCOPY;  Surgeon: Malissa Hippo, MD;  [facility];  Service: Endoscopy;  Laterality: N/A;  845   DILATION AND CURETTAGE OF UTERUS  06/03/2011   w/ suction for miscarriage   EXCISION OF BREAST BIOPSY Right 1999   INCISION AND DRAINAGE OF WOUND Right 01/10/2021   Procedure: INCISION AND DRAINAGE OF RIGHT AXILLA;  Surgeon: Glenna Fellows, MD;  Location: [facility];  Service: Plastics;  Laterality: Right;   OVUM /  OOCYTE RETRIEVAL     2010  and 2012   PERINEOPLASTY N/A 01/26/2022   Procedure: PERINEORRHAPY;  Surgeon: Marguerita Beards, MD;  Location: [facility];  Service: Gynecology;  Laterality: N/A;   PERINEOPLASTY N/A 02/19/2022   Procedure: PERINEOPLASTY revision;  Surgeon: Marguerita Beards, MD;  Location: [facility];  Service: Gynecology;  Laterality: N/A;   PORTACATH PLACEMENT Left 07/23/2020   Procedure: INSERTION PORT-A-CATH;  Surgeon: Emelia Loron, MD;  Location: [facility];  Service: General;  Laterality: Left;   THYROID LOBECTOMY Right 05/2010   @[facility]   VAGINAL HYSTERECTOMY  03/21/2019   @ [facility] by dr Mora Appl;   w/ Rogelio Seen culdoplasty   Patient Active Problem List   Diagnosis Date Noted   Prolapse of posterior vaginal wall 01/26/2022   Uterine prolapse 03/20/2021   Polyarthralgia 03/20/2021   Port-A-Cath in place 02/06/2021   Genetic testing 07/17/2020   Malignant neoplasm of upper-outer quadrant of right breast in female, estrogen receptor positive (HCC) 07/12/2020   Change in bowel habits 06/12/2016   Mixed hyperlipidemia 01/07/2016   Hypothyroidism, unspecified 01/07/2016   Allergic rhinitis 04/02/2015   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    PCP: Richardean Chimera, MD  REFERRING PROVIDER: Selmer Dominion, NP  REFERRING DIAG: [phone] (ICD-10-CM) - Post-operative state  THERAPY DIAG:  Other muscle spasm  Unspecified lack of coordination  Muscle weakness (generalized)  Rationale for Evaluation and Treatment: Rehabilitation  ONSET DATE: noticed at last pelvic exam in [place]  SUBJECTIVE:                                                                                                                                                                                           SUBJECTIVE STATEMENT: Pt reports "I don't think it's as sore."   PAIN:  Are you having pain? Yes NPRS scale: 6/10 Pain location:  posterior pelvic floor   Pain type: sharp Pain description: intermittent   Aggravating factors: only if something touches it Relieving factors: not having something touching it resolves immediately.   PRECAUTIONS: Other: breast CA - not currently getting treatments   WEIGHT BEARING RESTRICTIONS: No  FALLS:  Has patient fallen in last 6 months? No  LIVING ENVIRONMENT: Lives with: lives with their family   OCCUPATION: PA  PLOF: Independent  PATIENT GOALS: to have no  pain with sex  PERTINENT HISTORY:  She is allergic to hyaluronic acid.  10/23/2022 Muscle weakness (generalized)  Malignant neoplasm of upper-outer quadrant of right breast in female, estrogen receptor positive [facility])  Surgery: s/p Posterior repair, sacrospinous ligament fixation, perineorrhaphy, anal sphincteroplasty on 01/26/22  S/p EUA and revision perineoplasty on 02/21/22.  Sexual abuse: No  BOWEL MOVEMENT: Pain with bowel movement: No Type of bowel movement: normal for pt Fully empty rectum: Yes:   Leakage: No Pads: No Fiber supplement: No  URINATION: Pain with urination: No Fully empty bladder: Yes:   Stream: Strong Urgency: No Frequency: not quicker than every 2 hours Leakage:  none Pads: No  INTERCOURSE: Pain with intercourse: Initial Penetration and During Penetration Ability to have vaginal penetration:  Yes: but painful Climax: not really due to pain Marinoff Scale: 2/3  PREGNANCY: Vaginal deliveries 2 Tearing Yes: 3rd deg tear posterior with first and anterior second  C-section deliveries 0 Currently pregnant No  PROLAPSE: None   OBJECTIVE:   DIAGNOSTIC FINDINGS:    COGNITION: Overall cognitive status: Within functional limits for tasks assessed     SENSATION: Light touch: Appears intact Proprioception: Appears intact  MUSCLE LENGTH: Hamstrings and adductors limited by 25%    POSTURE: rounded shoulders, forward head, and flexed trunk   LUMBARAROM/PROM:  A/PROM A/PROM  eval  Flexion Limited by 25%  Extension WFL  Right lateral flexion WFL  Left lateral flexion WFL  Right rotation WFL  Left rotation WFL   (Blank rows = not tested)  LOWER EXTREMITY ROM:  WFL  LOWER EXTREMITY MMT:  Hip abduction 3+/5 bil, all other 4/5, knees 5/5 PALPATION:   General  no TTP externally                 External Perineal Exam mild TTP at posterior vaginal opening externally                             Internal Pelvic Floor bil bulbocavernosus,  at vaginal opening at clock face 8-4 locations, no TTP at deep layers except near coccyx. perineal scar tissue restriction/tenderness,   Patient confirms identification and approves PT to assess internal pelvic floor and treatment Yes  PELVIC MMT:   MMT eval  Vaginal 3/5, 8s, 10 reps  Internal Anal Sphincter   External Anal Sphincter   Puborectalis   Diastasis Recti   (Blank rows = not tested)        TONE: Pelvic floor atrophy   PROLAPSE: Not seen in hooklying   TODAY'S TREATMENT:                                                                                                                              DATE: 09/03/22  Manual - manual work at posterior vaginal opening with most tension directly at opening at slightly Lt side of opening. Gentle stretching in posterior direction with pt tolerating well today, with pt also tolerating slight bil stretching as well for improved tolerance to muscle stretching together for vaginal penetration during intercourse. Gentle stretching and trigger point release completed at also Rt pubococcygeus  and Lt pubococcygeus and iliococcygeus. Good release noted. Pain didn't increase throughout.  PATIENT EDUCATION:  Education details: [id number] Person educated: Patient Education method: Explanation, Demonstration, Actor cues, Verbal cues, and Handouts Education comprehension: verbalized understanding and returned demonstration  HOME EXERCISE PROGRAM: [id number]  ASSESSMENT:  CLINICAL IMPRESSION: Patient presents for treatment today. Pt session focused on manual work at vaginal opening for decreased pain and improved tissue mobility for tolerance to medical exams and intercourse. Great improvement  demonstrated with tissue mobility at end of session and reports feeling much less tenderness with manual work today, pleased with progress. Pt had a little tension in deeper layer of pelvic floor today, trigger point release tolerated well and  demonstrated improved tissue mobility.   Pt would benefit from additional PT to further address deficits.    OBJECTIVE IMPAIRMENTS: decreased coordination, decreased endurance, decreased mobility, decreased strength, increased fascial restrictions, increased muscle spasms, impaired flexibility, improper body mechanics, postural dysfunction, and pain.   ACTIVITY LIMITATIONS: sitting and intercourse, medical assessments  with internal needs  PARTICIPATION LIMITATIONS: interpersonal relationship and community activity  PERSONAL FACTORS: 1 comorbidity: medical history  are also affecting patient's functional outcome.   REHAB POTENTIAL: Good  CLINICAL DECISION MAKING: Stable/uncomplicated  EVALUATION COMPLEXITY: Low   GOALS: Goals reviewed with patient? Yes  SHORT TERM GOALS: Target date: 5//10/24  Pt to be I with advanced HEP.  Baseline: Goal status: INITIAL  2.  Pt will report no more than 5/10 pain due to improvements in posture, strength, and muscle length  Baseline:  Goal status: INITIAL   LONG TERM GOALS: Target date: 10/23/22  Pt to be I with advanced HEP.  Baseline:  Goal status: INITIAL 2.  Pt will report no more than 2/10 pain due to improvements in posture, strength, and muscle length  Baseline:  Goal status: INITIAL  3.  Pt to report improved tolerance to vaginal penetration for improved ability to be able to climax at least 75% of the instance of intercourse.  Baseline:  Goal status: INITIAL  4.  Pt to be I with manual self stretching of perineal body/vaginal opening to improve tissue mobility and decrease pain at home.  Baseline:  Goal status: INITIAL  5.  Pt to demonstrate at least 4/5 pelvic floor strength with ability to fully relax post contraction for improved pelvic stability and decreased strain at pelvic floor/ decrease leakage.  Baseline:  Goal status: INITIAL  6.  Pt to demonstrate at least 5/5 bil hip strength with good coordination of pelvic  floor and breathing  for improved pelvic stability and functional squats without leakage.   Baseline:  Goal status: INITIAL   PLAN:  PT FREQUENCY: 1x/week  PT DURATION:  8 sessions  PLANNED INTERVENTIONS: Therapeutic exercises, Therapeutic activity, Neuromuscular re-education, Balance training, Gait training, Patient/Family education, Self Care, Aquatic Therapy, Dry Needling, Spinal mobilization, Cryotherapy, Moist heat, scar mobilization, Taping, Biofeedback, and Manual therapy  PLAN FOR NEXT SESSION: manual work at perineal mobility, scar mobility, core and hip strengthening, coordination of pelvic floor and breathing mechanics   Otelia Sergeant, PT, DPT 05/23/242:39 PM

## 2022-09-10 ENCOUNTER — Encounter: Payer: BC Managed Care – PPO | Admitting: Physical Therapy

## 2022-09-17 ENCOUNTER — Ambulatory Visit: Payer: BC Managed Care – PPO | Attending: Obstetrics and Gynecology | Admitting: Physical Therapy

## 2022-09-17 DIAGNOSIS — M6281 Muscle weakness (generalized): Secondary | ICD-10-CM | POA: Diagnosis present

## 2022-09-17 DIAGNOSIS — R279 Unspecified lack of coordination: Secondary | ICD-10-CM | POA: Insufficient documentation

## 2022-09-17 DIAGNOSIS — Z170 Estrogen receptor positive status [ER+]: Secondary | ICD-10-CM | POA: Insufficient documentation

## 2022-09-17 DIAGNOSIS — R293 Abnormal posture: Secondary | ICD-10-CM | POA: Diagnosis present

## 2022-09-17 DIAGNOSIS — M62838 Other muscle spasm: Secondary | ICD-10-CM | POA: Insufficient documentation

## 2022-09-17 DIAGNOSIS — C50411 Malignant neoplasm of upper-outer quadrant of right female breast: Secondary | ICD-10-CM | POA: Diagnosis present

## 2022-09-17 NOTE — Therapy (Signed)
OUTPATIENT PHYSICAL THERAPY FEMALE PELVIC TREATMENT   Patient Name: Sandy Goodwin MRN: [id number] DOB:[birth year], 52 y.o., female Today's Date: 09/17/2022  END OF SESSION:  PT End of Session - 09/17/22 1401     Visit Number 7    Date for PT Re-Evaluation 10/23/22    Authorization Type [employer]    PT Start Time 1400    PT Stop Time 1438    PT Time Calculation (min) 38 min    Activity Tolerance Patient tolerated treatment well    Behavior During Therapy [facility] for tasks assessed/performed             Past Medical History:  Diagnosis Date   Full incontinence-feces    History of anemia    History of cancer chemotherapy    right breast;   neo-adjuvent 07-24-2020  to 11-15-2020  and 12-05-2020  to 07-24-2021   History of external beam radiation therapy    right reast 02-04-2021  to 03-25-2021   History of gastric ulcer    remote hx -- resolved   Hypothyroidism, postsurgical    followed by pcp;     02/ 2012  s/p  right hemithyroidectomy  (follicular adenoma)   Malignant neoplasm of upper-outer quadrant of right breast in female, estrogen receptor positive (HCC) 06/2020   oncologist--- dr Pamelia Hoit;  DCIS high grade right breast , neo-adjuvant chemo completed 11-15-2020,  s/p right breast lumpectomy w/ node dissection on 12-11-2020, then chemo completed 07-24-2021 and radiation completed 03-25-2021   Polyarthralgia    PONV (postoperative nausea and vomiting)    With first surgery, none since   Prolapse of vaginal vault after hysterectomy    and posterior prolapse   RA (rheumatoid arthritis) (HCC)    rheumatologsit--- dr Dierdre Forth;  no treatment   Tachycardia    Tachycardia   Past Surgical History:  Procedure Laterality Date   ANTERIOR AND POSTERIOR REPAIR WITH SACROSPINOUS FIXATION N/A 01/26/2022   Procedure: POSTERIOR REPAIR WITH SACROSPINOUS FIXATION;  Surgeon: Marguerita Beards, MD;  Location: [facility] [place];  Service: Gynecology;  Laterality: N/A;  total time requested  is 2 hours   APPENDECTOMY  08/2006   exploratory laparatomy w/ appendectomy (ruptured) and repair bowel   BREAST BIOPSY Right 07/30/2022   MM RT BREAST BX W LOC DEV 1ST LESION IMAGE BX SPEC STEREO GUIDE 07/30/2022 [facility]   BREAST LUMPECTOMY WITH RADIOACTIVE SEED AND SENTINEL LYMPH NODE BIOPSY Right 12/11/2020   Procedure: RIGHT BREAST LUMPECTOMY WITH RADIOACTIVE SEED X2 AND RIGHT AXILLARY SENTINEL LYMPH NODE BIOPSY;  Surgeon: Emelia Loron, MD;  Location: [facility];  Service: General;  Laterality: Right;   BREAST RECONSTRUCTION Right 12/20/2020   Procedure: RIGHT ONCOPLASTIC RECONSTRUCTION;  Surgeon: Glenna Fellows, MD;  Location: [facility];  Service: Plastics;  Laterality: Right;   BREAST REDUCTION SURGERY Left 12/20/2020   Procedure: LEFT BREAST REDUCTION;  Surgeon: Glenna Fellows, MD;  Location: [facility];  Service: Plastics;  Laterality: Left;   COLONOSCOPY N/A 06/15/2016   Procedure: COLONOSCOPY;  Surgeon: Malissa Hippo, MD;  [facility];  Service: Endoscopy;  Laterality: N/A;  845   DILATION AND CURETTAGE OF UTERUS  06/03/2011   w/ suction for miscarriage   EXCISION OF BREAST BIOPSY Right 1999   INCISION AND DRAINAGE OF WOUND Right 01/10/2021   Procedure: INCISION AND DRAINAGE OF RIGHT AXILLA;  Surgeon: Glenna Fellows, MD;  Location: [facility];  Service: Plastics;  Laterality: Right;   OVUM /  OOCYTE RETRIEVAL     2010  and 2012   PERINEOPLASTY N/A 01/26/2022   Procedure: PERINEORRHAPY;  Surgeon: Marguerita Beards, MD;  Location: [facility];  Service: Gynecology;  Laterality: N/A;   PERINEOPLASTY N/A 02/19/2022   Procedure: PERINEOPLASTY revision;  Surgeon: Marguerita Beards, MD;  Location: [facility];  Service: Gynecology;  Laterality: N/A;   PORTACATH PLACEMENT Left 07/23/2020   Procedure: INSERTION PORT-A-CATH;  Surgeon: Emelia Loron, MD;   Location: [facility];  Service: General;  Laterality: Left;   THYROID LOBECTOMY Right 05/2010   @[facility]   VAGINAL HYSTERECTOMY  03/21/2019   @ [facility] by dr Mora Appl;   w/ Rogelio Seen culdoplasty   Patient Active Problem List   Diagnosis Date Noted   Prolapse of posterior vaginal wall 01/26/2022   Uterine prolapse 03/20/2021   Polyarthralgia 03/20/2021   Port-A-Cath in place 02/06/2021   Genetic testing 07/17/2020   Malignant neoplasm of upper-outer quadrant of right breast in female, estrogen receptor positive (HCC) 07/12/2020   Change in bowel habits 06/12/2016   Mixed hyperlipidemia 01/07/2016   Hypothyroidism, unspecified 01/07/2016   Allergic rhinitis 04/02/2015   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    PCP: Richardean Chimera, MD  REFERRING PROVIDER: Selmer Dominion, NP  REFERRING DIAG: [phone] (ICD-10-CM) - Post-operative state  THERAPY DIAG:  Other muscle spasm  Unspecified lack of coordination  Abnormal posture  Rationale for Evaluation and Treatment: Rehabilitation  ONSET DATE: noticed at last pelvic exam in [place]  SUBJECTIVE:                                                                                                                                                                                           SUBJECTIVE STATEMENT: I still feel like I am having less pain, but able to have sex with less pain now but needs to use wand first.   PAIN:  Are you having pain? Yes NPRS scale: 4/10 Pain location:  posterior pelvic floor   Pain type: sharp Pain description: intermittent   Aggravating factors: only if something touches it Relieving factors: not having something touching it resolves immediately.   PRECAUTIONS: Other: breast CA - not currently getting treatments   WEIGHT BEARING RESTRICTIONS: No  FALLS:  Has patient fallen in last 6 months? No  LIVING ENVIRONMENT: Lives with: lives with their family   OCCUPATION:  PA  PLOF: Independent  PATIENT GOALS: to have no pain with sex  PERTINENT HISTORY:  She is allergic to hyaluronic acid.  10/23/2022 Muscle weakness (generalized)  Malignant neoplasm of upper-outer quadrant of right breast in female, estrogen receptor  positive [facility])  Surgery: s/p Posterior repair, sacrospinous ligament fixation, perineorrhaphy, anal sphincteroplasty on 01/26/22 S/p EUA and revision perineoplasty on 02/21/22.  Sexual abuse: No  BOWEL MOVEMENT: Pain with bowel movement: No Type of bowel movement: normal for pt Fully empty rectum: Yes:   Leakage: No Pads: No Fiber supplement: No  URINATION: Pain with urination: No Fully empty bladder: Yes:   Stream: Strong Urgency: No Frequency: not quicker than every 2 hours Leakage:  none Pads: No  INTERCOURSE: Pain with intercourse: Initial Penetration and During Penetration Ability to have vaginal penetration:  Yes: but painful Climax: not really due to pain Marinoff Scale: 2/3  PREGNANCY: Vaginal deliveries 2 Tearing Yes: 3rd deg tear posterior with first and anterior second  C-section deliveries 0 Currently pregnant No  PROLAPSE: None   OBJECTIVE:   DIAGNOSTIC FINDINGS:    COGNITION: Overall cognitive status: Within functional limits for tasks assessed     SENSATION: Light touch: Appears intact Proprioception: Appears intact  MUSCLE LENGTH: Hamstrings and adductors limited by 25%    POSTURE: rounded shoulders, forward head, and flexed trunk   LUMBARAROM/PROM:  A/PROM A/PROM  eval  Flexion Limited by 25%  Extension WFL  Right lateral flexion WFL  Left lateral flexion WFL  Right rotation WFL  Left rotation WFL   (Blank rows = not tested)  LOWER EXTREMITY ROM:  WFL  LOWER EXTREMITY MMT:  Hip abduction 3+/5 bil, all other 4/5, knees 5/5 PALPATION:   General  no TTP externally                 External Perineal Exam mild TTP at posterior vaginal opening externally                              Internal Pelvic Floor bil bulbocavernosus, at vaginal opening at clock face 8-4 locations, no TTP at deep layers except near coccyx. perineal scar tissue restriction/tenderness,   Patient confirms identification and approves PT to assess internal pelvic floor and treatment Yes  PELVIC MMT:   MMT eval  Vaginal 3/5, 8s, 10 reps  Internal Anal Sphincter   External Anal Sphincter   Puborectalis   Diastasis Recti   (Blank rows = not tested)        TONE: Pelvic floor atrophy   PROLAPSE: Not seen in hooklying   TODAY'S TREATMENT:                                                                                                                              DATE: 09/17/22:  Manual - manual work at posterior vaginal opening with most tension directly at opening at slightly Lt side of opening. Gentle stretching in posterior direction with pt tolerating well today, however had more pain today due to having more time between appointments but denied increase in pain just more uncomfortable then when she has appointments weekly. Gentle stretching and trigger point  release completed at Lt pubococcygeus. Good release noted and pt demonstrated improvement with tissue mobility after session.    PATIENT EDUCATION:  Education details: [id number] Person educated: Patient Education method: Explanation, Demonstration, Actor cues, Verbal cues, and Handouts Education comprehension: verbalized understanding and returned demonstration  HOME EXERCISE PROGRAM: [id number]  ASSESSMENT:  CLINICAL IMPRESSION: Patient presents for treatment today. Pt session focused on manual work at vaginal opening for decreased pain and improved tissue mobility for tolerance to medical exams and intercourse. pt pleased with progress overall. Pt tolerated well but had to cancel appt last week and reports she can a difference from not having the manual work last week. However pt still has pain and tightness at start of  sessions, improves after session and states she has noticed improved tolerance with vaginal penetration. Pt would benefit from additional PT to further address deficits.    OBJECTIVE IMPAIRMENTS: decreased coordination, decreased endurance, decreased mobility, decreased strength, increased fascial restrictions, increased muscle spasms, impaired flexibility, improper body mechanics, postural dysfunction, and pain.   ACTIVITY LIMITATIONS: sitting and intercourse, medical assessments  with internal needs  PARTICIPATION LIMITATIONS: interpersonal relationship and community activity  PERSONAL FACTORS: 1 comorbidity: medical history  are also affecting patient's functional outcome.   REHAB POTENTIAL: Good  CLINICAL DECISION MAKING: Stable/uncomplicated  EVALUATION COMPLEXITY: Low   GOALS: Goals reviewed with patient? Yes  SHORT TERM GOALS: Target date: 5//10/24  Pt to be I with advanced HEP.  Baseline: Goal status: MET  2.  Pt will report no more than 5/10 pain due to improvements in posture, strength, and muscle length  Baseline:  Goal status: MET   LONG TERM GOALS: Target date: 10/23/22  Pt to be I with advanced HEP.  Baseline:  Goal status: on going 2.  Pt will report no more than 2/10 pain due to improvements in posture, strength, and muscle length  Baseline:  Goal status: on going  3.  Pt to report improved tolerance to vaginal penetration for improved ability to be able to climax at least 75% of the instance of intercourse.  Baseline:  Goal status: on going  4.  Pt to be I with manual self stretching of perineal body/vaginal opening to improve tissue mobility and decrease pain at home.  Baseline:  Goal status: INITIAL  5.  Pt to demonstrate at least 4/5 pelvic floor strength with ability to fully relax post contraction for improved pelvic stability and decreased strain at pelvic floor/ decrease leakage.  Baseline:  Goal status: INITIALon going  6.  Pt to demonstrate  at least 5/5 bil hip strength with good coordination of pelvic floor and breathing  for improved pelvic stability and functional squats without leakage.   Baseline:  Goal status: MET   PLAN:  PT FREQUENCY: 1x/week  PT DURATION:  8 sessions  PLANNED INTERVENTIONS: Therapeutic exercises, Therapeutic activity, Neuromuscular re-education, Balance training, Gait training, Patient/Family education, Self Care, Aquatic Therapy, Dry Needling, Spinal mobilization, Cryotherapy, Moist heat, scar mobilization, Taping, Biofeedback, and Manual therapy  PLAN FOR NEXT SESSION: manual work at perineal mobility, scar mobility, core and hip strengthening, coordination of pelvic floor and breathing mechanics   Otelia Sergeant, PT, DPT 06/06/243:27 PM

## 2022-09-23 ENCOUNTER — Encounter: Payer: Self-pay | Admitting: *Deleted

## 2022-09-23 ENCOUNTER — Other Ambulatory Visit: Payer: Self-pay | Admitting: *Deleted

## 2022-09-23 ENCOUNTER — Encounter: Payer: Self-pay | Admitting: Hematology and Oncology

## 2022-09-23 DIAGNOSIS — Z780 Asymptomatic menopausal state: Secondary | ICD-10-CM

## 2022-09-23 DIAGNOSIS — Z170 Estrogen receptor positive status [ER+]: Secondary | ICD-10-CM

## 2022-09-23 MED ORDER — ANASTROZOLE 1 MG PO TABS: 1.0000 mg | ORAL_TABLET | Freq: Every day | ORAL | 1 refills | Status: DC

## 2022-09-23 NOTE — Progress Notes (Signed)
Received message from pt stating GI can not accommodate bone density for September of this year and pt requesting to change locations.  Orders placed for [facility].

## 2022-09-23 NOTE — Progress Notes (Signed)
Received mychart message from pt with complaint of increased muscle cramping and headaches while on 1/2 tablet of Tamoxifen daily.  Pt states she resumed Anastrozole and has been tolerating it well.  RN reviewed with MD and verbal orders received to continue with Anastrozole 1 mg p.o daily and discontinue Tamoxifen.  Prescription sent to pharmacy on file, pt educated and verbalized understanding.

## 2022-09-24 ENCOUNTER — Ambulatory Visit: Payer: BC Managed Care – PPO | Admitting: Physical Therapy

## 2022-09-24 DIAGNOSIS — M6281 Muscle weakness (generalized): Secondary | ICD-10-CM

## 2022-09-24 DIAGNOSIS — R279 Unspecified lack of coordination: Secondary | ICD-10-CM

## 2022-09-24 DIAGNOSIS — R293 Abnormal posture: Secondary | ICD-10-CM

## 2022-09-24 DIAGNOSIS — M62838 Other muscle spasm: Secondary | ICD-10-CM | POA: Diagnosis not present

## 2022-09-24 NOTE — Therapy (Signed)
OUTPATIENT PHYSICAL THERAPY FEMALE PELVIC TREATMENT   Patient Name: Sandy Goodwin MRN: [id number] DOB:November 12, 1970, 52 y.o., female Today's Date: 09/24/2022  END OF SESSION:  PT End of Session - 09/24/22 1357     Visit Number 8    Date for PT Re-Evaluation 10/23/22    Authorization Type [employer]    PT Start Time 1400    PT Stop Time 1444    PT Time Calculation (min) 44 min    Activity Tolerance Patient tolerated treatment well    Behavior During Therapy [facility] for tasks assessed/performed             Past Medical History:  Diagnosis Date   Full incontinence-feces    History of anemia    History of cancer chemotherapy    right breast;   neo-adjuvent 07-24-2020  to 11-15-2020  and 12-05-2020  to 07-24-2021   History of external beam radiation therapy    right reast 02-04-2021  to 03-25-2021   History of gastric ulcer    remote hx -- resolved   Hypothyroidism, postsurgical    followed by pcp;     02/ 2012  s/p  right hemithyroidectomy  (follicular adenoma)   Malignant neoplasm of upper-outer quadrant of right breast in female, estrogen receptor positive (HCC) 06/2020   oncologist--- dr Pamelia Hoit;  DCIS high grade right breast , neo-adjuvant chemo completed 11-15-2020,  s/p right breast lumpectomy w/ node dissection on 12-11-2020, then chemo completed 07-24-2021 and radiation completed 03-25-2021   Polyarthralgia    PONV (postoperative nausea and vomiting)    With first surgery, none since   Prolapse of vaginal vault after hysterectomy    and posterior prolapse   RA (rheumatoid arthritis) (HCC)    rheumatologsit--- dr Dierdre Forth;  no treatment   Tachycardia    Tachycardia   Past Surgical History:  Procedure Laterality Date   ANTERIOR AND POSTERIOR REPAIR WITH SACROSPINOUS FIXATION N/A 01/26/2022   Procedure: POSTERIOR REPAIR WITH SACROSPINOUS FIXATION;  Surgeon: Marguerita Beards, MD;  Location: [facility];  Service: Gynecology;  Laterality: N/A;  total time  requested is 2 hours   APPENDECTOMY  08/2006   exploratory laparatomy w/ appendectomy (ruptured) and repair bowel   BREAST BIOPSY Right 07/30/2022   MM RT BREAST BX W LOC DEV 1ST LESION IMAGE BX SPEC STEREO GUIDE 07/30/2022 [facility]   BREAST LUMPECTOMY WITH RADIOACTIVE SEED AND SENTINEL LYMPH NODE BIOPSY Right 12/11/2020   Procedure: RIGHT BREAST LUMPECTOMY WITH RADIOACTIVE SEED X2 AND RIGHT AXILLARY SENTINEL LYMPH NODE BIOPSY;  Surgeon: Emelia Loron, MD;  Location: [facility];  Service: General;  Laterality: Right;   BREAST RECONSTRUCTION Right 12/20/2020   Procedure: RIGHT ONCOPLASTIC RECONSTRUCTION;  Surgeon: Glenna Fellows, MD;  Location: [facility];  Service: Plastics;  Laterality: Right;   BREAST REDUCTION SURGERY Left 12/20/2020   Procedure: LEFT BREAST REDUCTION;  Surgeon: Glenna Fellows, MD;  Location: [facility];  Service: Plastics;  Laterality: Left;   COLONOSCOPY N/A 06/15/2016   Procedure: COLONOSCOPY;  Surgeon: Malissa Hippo, MD;  [facility];  Service: Endoscopy;  Laterality: N/A;  845   DILATION AND CURETTAGE OF UTERUS  06/03/2011   w/ suction for miscarriage   EXCISION OF BREAST BIOPSY Right 1999   INCISION AND DRAINAGE OF WOUND Right 01/10/2021   Procedure: INCISION AND DRAINAGE OF RIGHT AXILLA;  Surgeon: Glenna Fellows, MD;  Location: [facility];  Service: Plastics;  Laterality: Right;   OVUM /  OOCYTE RETRIEVAL     2010  and 2012   PERINEOPLASTY N/A 01/26/2022   Procedure: PERINEORRHAPY;  Surgeon: Marguerita Beards, MD;  Location: [facility];  Service: Gynecology;  Laterality: N/A;   PERINEOPLASTY N/A 02/19/2022   Procedure: PERINEOPLASTY revision;  Surgeon: Marguerita Beards, MD;  Location: [facility];  Service: Gynecology;  Laterality: N/A;   PORTACATH PLACEMENT Left 07/23/2020   Procedure: INSERTION PORT-A-CATH;  Surgeon: Emelia Loron, MD;  Location: [facility];  Service: General;  Laterality: Left;   THYROID LOBECTOMY Right 05/2010   @[facility]   VAGINAL HYSTERECTOMY  03/21/2019   @ [facility] by dr Mora Appl;   w/ Rogelio Seen culdoplasty   Patient Active Problem List   Diagnosis Date Noted   Prolapse of posterior vaginal wall 01/26/2022   Uterine prolapse 03/20/2021   Polyarthralgia 03/20/2021   Port-A-Cath in place 02/06/2021   Genetic testing 07/17/2020   Malignant neoplasm of upper-outer quadrant of right breast in female, estrogen receptor positive (HCC) 07/12/2020   Change in bowel habits 06/12/2016   Mixed hyperlipidemia 01/07/2016   Hypothyroidism, unspecified 01/07/2016   Allergic rhinitis 04/02/2015   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    PCP: Richardean Chimera, MD  REFERRING PROVIDER: Selmer Dominion, NP  REFERRING DIAG: [phone] (ICD-10-CM) - Post-operative state  THERAPY DIAG:  Unspecified lack of coordination  Abnormal posture  Muscle weakness (generalized)  Rationale for Evaluation and Treatment: Rehabilitation  ONSET DATE: noticed at last pelvic exam in [place]  SUBJECTIVE:                                                                                                                                                                                           SUBJECTIVE STATEMENT: Having less pain but still needs wand before penetration to make it more tolerable, otherwise unable to have penetration. Pain is usually 4/10 with penetration the last time had sex but once penetration complete lowers to 1-2/10, unless partner exits and returns penetration then back to 4/10. Right now comparable to size 4 dilators without increasing pain but unable to achieve any larger comparably.   PAIN:  Are you having pain? Yes NPRS scale: 4/10 Pain location:  posterior pelvic floor   Pain type: sharp Pain description: intermittent   Aggravating factors: only if something touches  it Relieving factors: not having something touching it resolves immediately.   PRECAUTIONS: Other: breast CA - not currently getting treatments   WEIGHT BEARING RESTRICTIONS: No  FALLS:  Has patient fallen in last 6 months? No  LIVING ENVIRONMENT: Lives with: lives with their family   OCCUPATION: PA  PLOF: Independent  PATIENT GOALS: to have no pain with sex  PERTINENT HISTORY:  She is allergic to hyaluronic acid.  10/23/2022 Muscle weakness (generalized)  Malignant neoplasm of upper-outer quadrant of right breast in female, estrogen receptor positive [facility])  Surgery: s/p Posterior repair, sacrospinous ligament fixation, perineorrhaphy, anal sphincteroplasty on 01/26/22 S/p EUA and revision perineoplasty on 02/21/22.  Sexual abuse: No  BOWEL MOVEMENT: Pain with bowel movement: No Type of bowel movement: normal for pt Fully empty rectum: Yes:   Leakage: No Pads: No Fiber supplement: No  URINATION: Pain with urination: No Fully empty bladder: Yes:   Stream: Strong Urgency: No Frequency: not quicker than every 2 hours Leakage:  none Pads: No  INTERCOURSE: Pain with intercourse: Initial Penetration and During Penetration Ability to have vaginal penetration:  Yes: but painful Climax: not really due to pain Marinoff Scale: 2/3  PREGNANCY: Vaginal deliveries 2 Tearing Yes: 3rd deg tear posterior with first and anterior second  C-section deliveries 0 Currently pregnant No  PROLAPSE: None   OBJECTIVE:   DIAGNOSTIC FINDINGS:    COGNITION: Overall cognitive status: Within functional limits for tasks assessed     SENSATION: Light touch: Appears intact Proprioception: Appears intact  MUSCLE LENGTH: Hamstrings and adductors limited by 25%    POSTURE: rounded shoulders, forward head, and flexed trunk   LUMBARAROM/PROM:  A/PROM A/PROM  eval  Flexion Limited by 25%  Extension WFL  Right lateral flexion WFL  Left lateral flexion WFL  Right rotation  WFL  Left rotation WFL   (Blank rows = not tested)  LOWER EXTREMITY ROM:  WFL  LOWER EXTREMITY MMT:  Hip abduction 3+/5 bil, all other 4/5, knees 5/5 PALPATION:   General  no TTP externally                 External Perineal Exam mild TTP at posterior vaginal opening externally                             Internal Pelvic Floor bil bulbocavernosus, at vaginal opening at clock face 8-4 locations, no TTP at deep layers except near coccyx. perineal scar tissue restriction/tenderness,   Patient confirms identification and approves PT to assess internal pelvic floor and treatment Yes  PELVIC MMT:   MMT eval  Vaginal 3/5, 8s, 10 reps  Internal Anal Sphincter   External Anal Sphincter   Puborectalis   Diastasis Recti   (Blank rows = not tested)        TONE: Pelvic floor atrophy   PROLAPSE: Not seen in hooklying   TODAY'S TREATMENT:                                                                                                                              DATE: 09/24/22  Manual - manual work at posterior vaginal opening with most tension directly at opening at slightly Lt side of opening. Gentle stretching  in posterior direction with pt tolerating better today, has been continuing to use pelvic wand and this helps but reports she has more mobility after skilled manual work in clinic. Gentle stretching and trigger point release completed at Lt pubococcygeus. Pt educated on different ways to hold pelvic wand for home to improve ability to hold wand and mobilize tissue as able. Pt reports she can feel an improvement since starting PT, previously unable to have penetration for sex at all. Now able to use wand prior to attempts at sex and can complete intercourse with decreased pain but still present (usually 4/10 initially and decreases to 1/10). Pt shown vaginal dilators and reports comparable partner size being size 8 in dilator set.     PATIENT EDUCATION:  Education details:  [id number] Person educated: Patient Education method: Explanation, Demonstration, Actor cues, Verbal cues, and Handouts Education comprehension: verbalized understanding and returned demonstration  HOME EXERCISE PROGRAM: [id number]  ASSESSMENT:  CLINICAL IMPRESSION: Patient presents for treatment today. Pt session focused on manual work at vaginal opening for decreased pain and improved tissue mobility for tolerance to medical exams and intercourse. pt pleased with progress overall but does continue to have tension at pelvic floor and pain with intercourse. However pt still has pain and tightness at start of sessions, improves after session and states she has noticed improved tolerance with vaginal penetration with use of wand to help relax/mobilize tissues for penetration. Pt would benefit from additional PT to further address deficits.    OBJECTIVE IMPAIRMENTS: decreased coordination, decreased endurance, decreased mobility, decreased strength, increased fascial restrictions, increased muscle spasms, impaired flexibility, improper body mechanics, postural dysfunction, and pain.   ACTIVITY LIMITATIONS: sitting and intercourse, medical assessments  with internal needs  PARTICIPATION LIMITATIONS: interpersonal relationship and community activity  PERSONAL FACTORS: 1 comorbidity: medical history  are also affecting patient's functional outcome.   REHAB POTENTIAL: Good  CLINICAL DECISION MAKING: Stable/uncomplicated  EVALUATION COMPLEXITY: Low   GOALS: Goals reviewed with patient? Yes  SHORT TERM GOALS: Target date: 5//10/24  Pt to be I with advanced HEP.  Baseline: Goal status: MET  2.  Pt will report no more than 5/10 pain due to improvements in posture, strength, and muscle length  Baseline:  Goal status: MET   LONG TERM GOALS: Target date: 10/23/22  Pt to be I with advanced HEP.  Baseline:  Goal status: on going 2.  Pt will report no more than 2/10 pain due to  improvements in posture, strength, and muscle length  Baseline:  Goal status: on going  3.  Pt to report improved tolerance to vaginal penetration for improved ability to be able to climax at least 75% of the instance of intercourse.  Baseline:  Goal status: on going  4.  Pt to be I with manual self stretching of perineal body/vaginal opening to improve tissue mobility and decrease pain at home.  Baseline:  Goal status: MET  5.  Pt to demonstrate at least 4/5 pelvic floor strength with ability to fully relax post contraction for improved pelvic stability and decreased strain at pelvic floor/ decrease leakage.  Baseline:  Goal status: on going  6.  Pt to demonstrate at least 5/5 bil hip strength with good coordination of pelvic floor and breathing  for improved pelvic stability and functional squats without leakage.   Baseline:  Goal status: MET   PLAN:  PT FREQUENCY: 1x/week  PT DURATION:  8 sessions  PLANNED INTERVENTIONS: Therapeutic exercises, Therapeutic activity, Neuromuscular re-education, Balance training, Gait training,  Patient/Family education, Self Care, Aquatic Therapy, Dry Needling, Spinal mobilization, Cryotherapy, Moist heat, scar mobilization, Taping, Biofeedback, and Manual therapy  PLAN FOR NEXT SESSION: manual work at perineal mobility, scar mobility, core and hip strengthening, coordination of pelvic floor and breathing mechanics   Otelia Sergeant, PT, DPT 06/13/243:13 PM

## 2022-10-08 ENCOUNTER — Ambulatory Visit: Payer: BC Managed Care – PPO | Admitting: Physical Therapy

## 2022-10-08 DIAGNOSIS — R279 Unspecified lack of coordination: Secondary | ICD-10-CM

## 2022-10-08 DIAGNOSIS — Z170 Estrogen receptor positive status [ER+]: Secondary | ICD-10-CM

## 2022-10-08 DIAGNOSIS — M62838 Other muscle spasm: Secondary | ICD-10-CM

## 2022-10-08 DIAGNOSIS — R293 Abnormal posture: Secondary | ICD-10-CM

## 2022-10-08 DIAGNOSIS — M6281 Muscle weakness (generalized): Secondary | ICD-10-CM

## 2022-10-08 NOTE — Therapy (Signed)
OUTPATIENT PHYSICAL THERAPY FEMALE PELVIC TREATMENT   Patient Name: Sandy Goodwin MRN: [id number] DOB:11/03/1970, 52 y.o., female Today's Date: 10/08/2022 Progress Note Reporting Period 07/24/22 to 10/08/22  See note below for Objective Data and Assessment of Progress/Goals.     END OF SESSION:  PT End of Session - 10/08/22 0934     Visit Number 9    Date for PT Re-Evaluation 01/11/23    Authorization Type BC[employer]    PT Start Time 0846    PT Stop Time 0926    PT Time Calculation (min) 40 min    Activity Tolerance Patient tolerated treatment well    Behavior During Therapy [facility] for tasks assessed/performed             Past Medical History:  Diagnosis Date   Full incontinence-feces    History of anemia    History of cancer chemotherapy    right breast;   neo-adjuvent 07-24-2020  to 11-15-2020  and 12-05-2020  to 07-24-2021   History of external beam radiation therapy    right reast 02-04-2021  to 03-25-2021   History of gastric ulcer    remote hx -- resolved   Hypothyroidism, postsurgical    followed by pcp;     02/ 2012  s/p  right hemithyroidectomy  (follicular adenoma)   Malignant neoplasm of upper-outer quadrant of right breast in female, estrogen receptor positive (HCC) 06/2020   oncologist--- dr Pamelia Hoit;  DCIS high grade right breast , neo-adjuvant chemo completed 11-15-2020,  s/p right breast lumpectomy w/ node dissection on 12-11-2020, then chemo completed 07-24-2021 and radiation completed 03-25-2021   Polyarthralgia    PONV (postoperative nausea and vomiting)    With first surgery, none since   Prolapse of vaginal vault after hysterectomy    and posterior prolapse   RA (rheumatoid arthritis) (HCC)    rheumatologsit--- dr Dierdre Forth;  no treatment   Tachycardia    Tachycardia   Past Surgical History:  Procedure Laterality Date   ANTERIOR AND POSTERIOR REPAIR WITH SACROSPINOUS FIXATION N/A 01/26/2022   Procedure: POSTERIOR REPAIR WITH SACROSPINOUS FIXATION;  Surgeon:  Marguerita Beards, MD;  Location: [facility];  Service: Gynecology;  Laterality: N/A;  total time requested is 2 hours   APPENDECTOMY  08/2006   exploratory laparatomy w/ appendectomy (ruptured) and repair bowel   BREAST BIOPSY Right 07/30/2022   MM RT BREAST BX W LOC DEV 1ST LESION IMAGE BX SPEC STEREO GUIDE 07/30/2022 [facility]   BREAST LUMPECTOMY WITH RADIOACTIVE SEED AND SENTINEL LYMPH NODE BIOPSY Right 12/11/2020   Procedure: RIGHT BREAST LUMPECTOMY WITH RADIOACTIVE SEED X2 AND RIGHT AXILLARY SENTINEL LYMPH NODE BIOPSY;  Surgeon: Emelia Loron, MD;  Location: [facility];  Service: General;  Laterality: Right;   BREAST RECONSTRUCTION Right 12/20/2020   Procedure: RIGHT ONCOPLASTIC RECONSTRUCTION;  Surgeon: Glenna Fellows, MD;  Location: [facility];  Service: Plastics;  Laterality: Right;   BREAST REDUCTION SURGERY Left 12/20/2020   Procedure: LEFT BREAST REDUCTION;  Surgeon: Glenna Fellows, MD;  Location: [facility];  Service: Plastics;  Laterality: Left;   COLONOSCOPY N/A 06/15/2016   Procedure: COLONOSCOPY;  Surgeon: Malissa Hippo, MD;  [facility];  Service: Endoscopy;  Laterality: N/A;  845   DILATION AND CURETTAGE OF UTERUS  06/03/2011   w/ suction for miscarriage   EXCISION OF BREAST BIOPSY Right 1999   INCISION AND DRAINAGE OF WOUND Right 01/10/2021   Procedure: INCISION AND DRAINAGE OF RIGHT  AXILLA;  Surgeon: Glenna Fellows, MD;  Location: [facility];  Service: Plastics;  Laterality: Right;   OVUM / OOCYTE RETRIEVAL     2010  and 2012   PERINEOPLASTY N/A 01/26/2022   Procedure: PERINEORRHAPY;  Surgeon: Marguerita Beards, MD;  Location: [facility];  Service: Gynecology;  Laterality: N/A;   PERINEOPLASTY N/A 02/19/2022   Procedure: PERINEOPLASTY revision;  Surgeon: Marguerita Beards, MD;  Location: [facility];  Service: Gynecology;   Laterality: N/A;   PORTACATH PLACEMENT Left 07/23/2020   Procedure: INSERTION PORT-A-CATH;  Surgeon: Emelia Loron, MD;  Location: [facility];  Service: General;  Laterality: Left;   THYROID LOBECTOMY Right 05/2010   @[facility]   VAGINAL HYSTERECTOMY  03/21/2019   @ [facility] by dr Mora Appl;   w/ Rogelio Seen culdoplasty   Patient Active Problem List   Diagnosis Date Noted   Prolapse of posterior vaginal wall 01/26/2022   Uterine prolapse 03/20/2021   Polyarthralgia 03/20/2021   Port-A-Cath in place 02/06/2021   Genetic testing 07/17/2020   Malignant neoplasm of upper-outer quadrant of right breast in female, estrogen receptor positive (HCC) 07/12/2020   Change in bowel habits 06/12/2016   Mixed hyperlipidemia 01/07/2016   Hypothyroidism, unspecified 01/07/2016   Allergic rhinitis 04/02/2015   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    PCP: Richardean Chimera, MD  REFERRING PROVIDER: Selmer Dominion, NP  REFERRING DIAG: [phone] (ICD-10-CM) - Post-operative state  THERAPY DIAG:  Unspecified lack of coordination  Abnormal posture  Muscle weakness (generalized)  Other muscle spasm  Malignant neoplasm of upper-outer quadrant of right breast in female, estrogen receptor positive (HCC)  Rationale for Evaluation and Treatment: Rehabilitation  ONSET DATE: noticed at last pelvic exam in [place]  SUBJECTIVE:                                                                                                                                                                                           SUBJECTIVE STATEMENT: Pt reports she does continue to have less pain, however feels like waiting two weeks between sessions has been hard. She does do home stretching with pelvic wand but doesn't get as much mobility as with PFPT providing manual work. Pt reports medication from MD has helped with tissue health vaginally and at pelvic floor but does still need it for relief. Pt  reports she is able to have intercourse now but continues to need pelvic wand stretching prior to penile penetration or unable to tolerate.   PAIN:  Are you having pain? Yes NPRS scale: 3/10 Pain location:  vaginal opening   Pain type: sharp  Pain description: intermittent   Aggravating factors: only if something touches it Relieving factors: not having something touching it resolves immediately.   PRECAUTIONS: Other: breast CA - not currently getting treatments   WEIGHT BEARING RESTRICTIONS: No  FALLS:  Has patient fallen in last 6 months? No  LIVING ENVIRONMENT: Lives with: lives with their family   OCCUPATION: PA  PLOF: Independent  PATIENT GOALS: to have no pain with sex  PERTINENT HISTORY:  She is allergic to hyaluronic acid.  10/23/2022 Muscle weakness (generalized)  Malignant neoplasm of upper-outer quadrant of right breast in female, estrogen receptor positive [facility])  Surgery: s/p Posterior repair, sacrospinous ligament fixation, perineorrhaphy, anal sphincteroplasty on 01/26/22 S/p EUA and revision perineoplasty on 02/21/22.  Sexual abuse: No  BOWEL MOVEMENT: Pain with bowel movement: No Type of bowel movement: normal for pt Fully empty rectum: Yes:   Leakage: No Pads: No Fiber supplement: No  URINATION: Pain with urination: No Fully empty bladder: Yes:   Stream: Strong Urgency: No Frequency: not quicker than every 2 hours Leakage:  none Pads: No  INTERCOURSE: Pain with intercourse: Initial Penetration and During Penetration Ability to have vaginal penetration:  Yes: but painful Climax: not really due to pain Marinoff Scale: 2/3  PREGNANCY: Vaginal deliveries 2 Tearing Yes: 3rd deg tear posterior with first and anterior second  C-section deliveries 0 Currently pregnant No  PROLAPSE: None   OBJECTIVE:   DIAGNOSTIC FINDINGS:    COGNITION: Overall cognitive status: Within functional limits for tasks assessed     SENSATION: Light  touch: Appears intact Proprioception: Appears intact  MUSCLE LENGTH: Hamstrings and adductors limited by 25%    POSTURE: rounded shoulders, forward head, and flexed trunk   LUMBARAROM/PROM:  A/PROM A/PROM  eval  Flexion Limited by 25%  Extension WFL  Right lateral flexion WFL  Left lateral flexion WFL  Right rotation WFL  Left rotation WFL   (Blank rows = not tested)  LOWER EXTREMITY ROM:  WFL  LOWER EXTREMITY MMT:  Hip abduction 3+/5 bil, all other 4/5, knees 5/5 PALPATION:   General  no TTP externally                 External Perineal Exam mild TTP at posterior vaginal opening externally 10/08/22 - TTP at bil labia minora however reports she needs to add her cream externally to help with tissue mobility and moisture.                              Internal Pelvic Floor bil bulbocavernosus, at vaginal opening at clock face 8-4 locations, no TTP at deep layers except near coccyx. perineal scar tissue restriction/tenderness,  10/08/22 - Lt bulbocavernosus, tension most felt at 4-5 on clock face of vaginal opening, also TTP throughout Lt pubococcygeus possible puborectalis with trigger points noted. Improving perineal scar mobility with mild restrictions now.   Patient confirms identification and approves PT to assess internal pelvic floor and treatment Yes  PELVIC MMT:   MMT eval 10/08/22   Vaginal 3/5, 8s, 10 reps 3/5, 10s, 10 reps - however focus of training has been improved pain management with decreased tension  Internal Anal Sphincter    External Anal Sphincter    Puborectalis    Diastasis Recti    (Blank rows = not tested)        TONE: Pelvic floor atrophy   PROLAPSE: Not seen in hooklying   TODAY'S TREATMENT:  DATE: 10/08/22  Manual - manual work at posterior vaginal opening with most tension directly at opening at slightly Lt side  of opening. Gentle stretching in posterior direction, has been continuing to use pelvic wand and this helps but reports she has more mobility after skilled manual work in clinic. Gentle stretching and trigger point release completed at Lt pubococcygeus and lt bulbocavernosus.  Pt continues to endorse an improvement since starting PT, previously unable to have penetration for sex at all. Now able to use wand prior intercourse and notes relaxation more quickly and can complete intercourse with decreased pain but still present (usually 4/10 initially and decreases to 1/10) and much more tolerable now. Pt shown vaginal dilators and reports comparable partner size being size 8 in dilator set.   Pt educated today that vaginal dilator set may me helpful for new progression of stretching at home. This would provide pt with gradual increase in stretching for width in all directions, length, and mobility with this as well. Instead of only stretching vaginal opening in one direction with wand which was previously all pt could tolerate due to pain. Pt verbalized understanding and denied additional questions.  HEP also updated today   PATIENT EDUCATION:  Education details: [id number] Person educated: Patient Education method: Explanation, Demonstration, Tactile cues, Verbal cues, and Handouts Education comprehension: verbalized understanding and returned demonstration  HOME EXERCISE PROGRAM: [id number]    ASSESSMENT:  CLINICAL IMPRESSION: Patient presents for treatment today. Pt session focused on manual work at vaginal opening for decreased pain and improved tissue mobility for tolerance to medical exams and intercourse. pt pleased with progress overall but does continue to have tension at pelvic floor and pain with intercourse. However pt still has pain and tightness at start of sessions, improves after session and states she has noticed improved tolerance with vaginal penetration with use of wand to help  relax/mobilize tissues for penetration. Also notes she feels more relaxation quicker now with wand then able to progress to penetration with lessening pain, able to finish intercourse now without stopping due to pain. Pt also educated on vaginal dilators for progression of stretching and mobility tolerance at home to further improve mobility. Previously pt only tolerating stretching in one direction at time and wand used for this, now more able to tolerate mobility/stretching in all directions and dilator may assist with this and allow pt gradual progression of sizing to better tolerate partner and medical exams without pain. Due to pt's hormonal (+) cancer and medical treatment plan, complex history of pelvic floor repair surgery, and revision pt needs extra time to progress and allow for tissue health to repair and mobilize status post radiation and hormone changes since cancer treatment. Pt would benefit from additional PT to continue to progress toward goals.   OBJECTIVE IMPAIRMENTS: decreased coordination, decreased endurance, decreased mobility, decreased strength, increased fascial restrictions, increased muscle spasms, impaired flexibility, improper body mechanics, postural dysfunction, and pain.   ACTIVITY LIMITATIONS: sitting and intercourse, medical assessments  with internal needs  PARTICIPATION LIMITATIONS: interpersonal relationship and community activity  PERSONAL FACTORS: 1 comorbidity: medical history  are also affecting patient's functional outcome.   REHAB POTENTIAL: Good  CLINICAL DECISION MAKING: Stable/uncomplicated  EVALUATION COMPLEXITY: Low   GOALS: Goals reviewed with patient? Yes  SHORT TERM GOALS: Target date: 5//10/24  Pt to be I with advanced HEP.  Baseline: Goal status: MET  2.  Pt will report no more than 5/10 pain due to improvements in posture, strength, and muscle length  Baseline:  Goal status: MET   LONG TERM GOALS: Target date: 10/23/22  Pt to be  I with advanced HEP.  Baseline:  Goal status: on going 2.  Pt will report no more than 2/10 pain due to improvements in posture, strength, and muscle length  Baseline:  Goal status: on going  3.  Pt to report improved tolerance to vaginal penetration for improved ability to be able to climax at least 75% of the instance of intercourse.  Baseline:  Goal status: on going  4.  Pt to be I with manual self stretching of perineal body/vaginal opening to improve tissue mobility and decrease pain at home.  Baseline:  Goal status: MET  5.  Pt to demonstrate at least 4/5 pelvic floor strength with ability to fully relax post contraction for improved pelvic stability and decreased strain at pelvic floor/ decrease leakage.  Baseline:  Goal status: on going  6.  Pt to demonstrate at least 5/5 bil hip strength with good coordination of pelvic floor and breathing  for improved pelvic stability and functional squats without leakage.   Baseline:  Goal status: MET   PLAN:  PT FREQUENCY: 1x/week  PT DURATION:  8 sessions  PLANNED INTERVENTIONS: Therapeutic exercises, Therapeutic activity, Neuromuscular re-education, Balance training, Gait training, Patient/Family education, Self Care, Aquatic Therapy, Dry Needling, Spinal mobilization, Cryotherapy, Moist heat, scar mobilization, Taping, Biofeedback, and Manual therapy  PLAN FOR NEXT SESSION: manual work at perineal mobility, scar mobility, core and hip strengthening, coordination of pelvic floor and breathing mechanics   Otelia Sergeant, PT, DPT 06/27/249:52 AM

## 2022-10-08 NOTE — Addendum Note (Signed)
Addended by: Barbaraann Faster on: 10/08/2022 09:53 AM   Modules accepted: Orders

## 2022-10-09 NOTE — Telephone Encounter (Signed)
Attempted to call pt regarding signatera results lvm for pt to return call back.   

## 2022-10-12 ENCOUNTER — Ambulatory Visit: Payer: BC Managed Care – PPO | Attending: Obstetrics and Gynecology | Admitting: Physical Therapy

## 2022-10-12 DIAGNOSIS — R279 Unspecified lack of coordination: Secondary | ICD-10-CM | POA: Diagnosis present

## 2022-10-12 DIAGNOSIS — M6281 Muscle weakness (generalized): Secondary | ICD-10-CM

## 2022-10-12 DIAGNOSIS — R293 Abnormal posture: Secondary | ICD-10-CM

## 2022-10-12 DIAGNOSIS — M62838 Other muscle spasm: Secondary | ICD-10-CM

## 2022-10-12 NOTE — Therapy (Signed)
OUTPATIENT PHYSICAL THERAPY FEMALE PELVIC TREATMENT   Patient Name: Sandy Goodwin MRN: [id number] DOB:1971-03-29, 52 y.o., female Today's Date: 10/12/2022 Progress Note Reporting Period 07/24/22 to 10/08/22  See note below for Objective Data and Assessment of Progress/Goals.     END OF SESSION:  PT End of Session - 10/12/22 1925     Visit Number 10    Date for PT Re-Evaluation 01/11/23    Authorization Type [employer]    PT Start Time 1532    PT Stop Time 1615    PT Time Calculation (min) 43 min    Activity Tolerance Patient tolerated treatment well    Behavior During Therapy WFL for tasks assessed/performed             Past Medical History:  Diagnosis Date   Full incontinence-feces    History of anemia    History of cancer chemotherapy    right breast;   neo-adjuvent 07-24-2020  to 11-15-2020  and 12-05-2020  to 07-24-2021   History of external beam radiation therapy    right reast 02-04-2021  to 03-25-2021   History of gastric ulcer    remote hx -- resolved   Hypothyroidism, postsurgical    followed by pcp;     02/ 2012  s/p  right hemithyroidectomy  (follicular adenoma)   Malignant neoplasm of upper-outer quadrant of right breast in female, estrogen receptor positive (HCC) 06/2020   oncologist--- dr Pamelia Hoit;  DCIS high grade right breast , neo-adjuvant chemo completed 11-15-2020,  s/p right breast lumpectomy w/ node dissection on 12-11-2020, then chemo completed 07-24-2021 and radiation completed 03-25-2021   Polyarthralgia    PONV (postoperative nausea and vomiting)    With first surgery, none since   Prolapse of vaginal vault after hysterectomy    and posterior prolapse   RA (rheumatoid arthritis) (HCC)    rheumatologsit--- dr Dierdre Forth;  no treatment   Tachycardia    Tachycardia   Past Surgical History:  Procedure Laterality Date   ANTERIOR AND POSTERIOR REPAIR WITH SACROSPINOUS FIXATION N/A 01/26/2022   Procedure: POSTERIOR REPAIR WITH SACROSPINOUS FIXATION;  Surgeon:  Marguerita Beards, MD;  Location: [facility];  Service: Gynecology;  Laterality: N/A;  total time requested is 2 hours   APPENDECTOMY  08/2006   exploratory laparatomy w/ appendectomy (ruptured) and repair bowel   BREAST BIOPSY Right 07/30/2022   MM RT BREAST BX W LOC DEV 1ST LESION IMAGE BX SPEC STEREO GUIDE 07/30/2022 [facility]   BREAST LUMPECTOMY WITH RADIOACTIVE SEED AND SENTINEL LYMPH NODE BIOPSY Right 12/11/2020   Procedure: RIGHT BREAST LUMPECTOMY WITH RADIOACTIVE SEED X2 AND RIGHT AXILLARY SENTINEL LYMPH NODE BIOPSY;  Surgeon: Emelia Loron, MD;  Location: [facility];  Service: General;  Laterality: Right;   BREAST RECONSTRUCTION Right 12/20/2020   Procedure: RIGHT ONCOPLASTIC RECONSTRUCTION;  Surgeon: Glenna Fellows, MD;  Location: [facility];  Service: Plastics;  Laterality: Right;   BREAST REDUCTION SURGERY Left 12/20/2020   Procedure: LEFT BREAST REDUCTION;  Surgeon: Glenna Fellows, MD;  Location: [facility];  Service: Plastics;  Laterality: Left;   COLONOSCOPY N/A 06/15/2016   Procedure: COLONOSCOPY;  Surgeon: Malissa Hippo, MD;  [facility];  Service: Endoscopy;  Laterality: N/A;  845   DILATION AND CURETTAGE OF UTERUS  06/03/2011   w/ suction for miscarriage   EXCISION OF BREAST BIOPSY Right 1999   INCISION AND DRAINAGE OF WOUND Right 01/10/2021   Procedure: INCISION AND DRAINAGE OF RIGHT  AXILLA;  Surgeon: Glenna Fellows, MD;  Location: [facility];  Service: Plastics;  Laterality: Right;   OVUM / OOCYTE RETRIEVAL     2010  and 2012   PERINEOPLASTY N/A 01/26/2022   Procedure: PERINEORRHAPY;  Surgeon: Marguerita Beards, MD;  Location: [facility];  Service: Gynecology;  Laterality: N/A;   PERINEOPLASTY N/A 02/19/2022   Procedure: PERINEOPLASTY revision;  Surgeon: Marguerita Beards, MD;  Location: [facility];  Service: Gynecology;   Laterality: N/A;   PORTACATH PLACEMENT Left 07/23/2020   Procedure: INSERTION PORT-A-CATH;  Surgeon: Emelia Loron, MD;  Location: [facility];  Service: General;  Laterality: Left;   THYROID LOBECTOMY Right 05/2010   @[facility]   VAGINAL HYSTERECTOMY  03/21/2019   @ [facility] by dr Mora Appl;   w/ Rogelio Seen culdoplasty   Patient Active Problem List   Diagnosis Date Noted   Prolapse of posterior vaginal wall 01/26/2022   Uterine prolapse 03/20/2021   Polyarthralgia 03/20/2021   Port-A-Cath in place 02/06/2021   Genetic testing 07/17/2020   Malignant neoplasm of upper-outer quadrant of right breast in female, estrogen receptor positive (HCC) 07/12/2020   Change in bowel habits 06/12/2016   Mixed hyperlipidemia 01/07/2016   Hypothyroidism, unspecified 01/07/2016   Allergic rhinitis 04/02/2015   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    PCP: Richardean Chimera, MD  REFERRING PROVIDER: Selmer Dominion, NP  REFERRING DIAG: [phone] (ICD-10-CM) - Post-operative state  THERAPY DIAG:  Unspecified lack of coordination  Abnormal posture  Muscle weakness (generalized)  Other muscle spasm  Rationale for Evaluation and Treatment: Rehabilitation  ONSET DATE: noticed at last pelvic exam in [place]  SUBJECTIVE:                                                                                                                                                                                           SUBJECTIVE STATEMENT: Pt reports she does continue to have less pain, however feels like waiting two weeks between sessions has been hard. She does do home stretching with pelvic wand but doesn't get as much mobility as with PFPT providing manual work. Pt reports medication from MD has helped with tissue health vaginally and at pelvic floor but does still need it for relief. Pt reports she is able to have intercourse now but continues to need pelvic wand stretching prior to penile  penetration or unable to tolerate.   PAIN:  Are you having pain? Yes NPRS scale: 3/10 Pain location:  vaginal opening   Pain type: sharp Pain description: intermittent   Aggravating factors: only if something touches it Relieving factors: not  having something touching it resolves immediately.   PRECAUTIONS: Other: breast CA - not currently getting treatments   WEIGHT BEARING RESTRICTIONS: No  FALLS:  Has patient fallen in last 6 months? No  LIVING ENVIRONMENT: Lives with: lives with their family   OCCUPATION: PA  PLOF: Independent  PATIENT GOALS: to have no pain with sex  PERTINENT HISTORY:  She is allergic to hyaluronic acid.  10/23/2022 Muscle weakness (generalized)  Malignant neoplasm of upper-outer quadrant of right breast in female, estrogen receptor positive [facility])  Surgery: s/p Posterior repair, sacrospinous ligament fixation, perineorrhaphy, anal sphincteroplasty on 01/26/22 S/p EUA and revision perineoplasty on 02/21/22.  Sexual abuse: No  BOWEL MOVEMENT: Pain with bowel movement: No Type of bowel movement: normal for pt Fully empty rectum: Yes:   Leakage: No Pads: No Fiber supplement: No  URINATION: Pain with urination: No Fully empty bladder: Yes:   Stream: Strong Urgency: No Frequency: not quicker than every 2 hours Leakage:  none Pads: No  INTERCOURSE: Pain with intercourse: Initial Penetration and During Penetration Ability to have vaginal penetration:  Yes: but painful Climax: not really due to pain Marinoff Scale: 2/3  PREGNANCY: Vaginal deliveries 2 Tearing Yes: 3rd deg tear posterior with first and anterior second  C-section deliveries 0 Currently pregnant No  PROLAPSE: None   OBJECTIVE:   DIAGNOSTIC FINDINGS:    COGNITION: Overall cognitive status: Within functional limits for tasks assessed     SENSATION: Light touch: Appears intact Proprioception: Appears intact  MUSCLE LENGTH: Hamstrings and adductors limited by  25%    POSTURE: rounded shoulders, forward head, and flexed trunk   LUMBARAROM/PROM:  A/PROM A/PROM  eval  Flexion Limited by 25%  Extension WFL  Right lateral flexion WFL  Left lateral flexion WFL  Right rotation WFL  Left rotation WFL   (Blank rows = not tested)  LOWER EXTREMITY ROM:  WFL  LOWER EXTREMITY MMT:  Hip abduction 3+/5 bil, all other 4/5, knees 5/5 PALPATION:   General  no TTP externally                 External Perineal Exam mild TTP at posterior vaginal opening externally 10/08/22 - TTP at bil labia minora however reports she needs to add her cream externally to help with tissue mobility and moisture.                              Internal Pelvic Floor bil bulbocavernosus, at vaginal opening at clock face 8-4 locations, no TTP at deep layers except near coccyx. perineal scar tissue restriction/tenderness,  10/08/22 - Lt bulbocavernosus, tension most felt at 4-5 on clock face of vaginal opening, also TTP throughout Lt pubococcygeus possible puborectalis with trigger points noted. Improving perineal scar mobility with mild restrictions now.   Patient confirms identification and approves PT to assess internal pelvic floor and treatment Yes  PELVIC MMT:   MMT eval 10/08/22   Vaginal 3/5, 8s, 10 reps 3/5, 10s, 10 reps - however focus of training has been improved pain management with decreased tension  Internal Anal Sphincter    External Anal Sphincter    Puborectalis    Diastasis Recti    (Blank rows = not tested)        TONE: Pelvic floor atrophy   PROLAPSE: Not seen in hooklying   TODAY'S TREATMENT:  DATE: 10/12/22   Manual - manual work at posterior vaginal opening with most tension directly at opening at slightly Lt side of opening. Gentle stretching and trigger point release completed at Lt  bulbocavernosus only today. Pt  demonstrated improved improved mobility compared to last session as these two sessions closer together. Pt also directed in contract/relax technique for improved mobility. This demonstrated improved mobility of tissue and pt denied increased pain.  PATIENT EDUCATION:  Education details: [id number] Person educated: Patient Education method: Explanation, Demonstration, Actor cues, Verbal cues, and Handouts Education comprehension: verbalized understanding and returned demonstration  HOME EXERCISE PROGRAM: [id number]    ASSESSMENT:  CLINICAL IMPRESSION: Patient presents for treatment today. Pt session focused on manual work at vaginal opening for decreased pain and improved tissue mobility for tolerance to medical exams and intercourse. Pt tolerated manual work well - demonstrated improved mobility today compared to last session. Initiated contract/relax today with good effect. No longer having trigger points only the smaller area of great tension and TTP. Pt would benefit from additional PT to continue to progress toward goals.   OBJECTIVE IMPAIRMENTS: decreased coordination, decreased endurance, decreased mobility, decreased strength, increased fascial restrictions, increased muscle spasms, impaired flexibility, improper body mechanics, postural dysfunction, and pain.   ACTIVITY LIMITATIONS: sitting and intercourse, medical assessments  with internal needs  PARTICIPATION LIMITATIONS: interpersonal relationship and community activity  PERSONAL FACTORS: 1 comorbidity: medical history  are also affecting patient's functional outcome.   REHAB POTENTIAL: Good  CLINICAL DECISION MAKING: Stable/uncomplicated  EVALUATION COMPLEXITY: Low   GOALS: Goals reviewed with patient? Yes  SHORT TERM GOALS: Target date: 5//10/24  Pt to be I with advanced HEP.  Baseline: Goal status: MET  2.  Pt will report no more than 5/10 pain due to improvements in posture, strength, and muscle length   Baseline:  Goal status: MET   LONG TERM GOALS: Target date: 10/23/22  Pt to be I with advanced HEP.  Baseline:  Goal status: on going 2.  Pt will report no more than 2/10 pain due to improvements in posture, strength, and muscle length  Baseline:  Goal status: on going  3.  Pt to report improved tolerance to vaginal penetration for improved ability to be able to climax at least 75% of the instance of intercourse.  Baseline:  Goal status: on going  4.  Pt to be I with manual self stretching of perineal body/vaginal opening to improve tissue mobility and decrease pain at home.  Baseline:  Goal status: MET  5.  Pt to demonstrate at least 4/5 pelvic floor strength with ability to fully relax post contraction for improved pelvic stability and decreased strain at pelvic floor/ decrease leakage.  Baseline:  Goal status: on going  6.  Pt to demonstrate at least 5/5 bil hip strength with good coordination of pelvic floor and breathing  for improved pelvic stability and functional squats without leakage.   Baseline:  Goal status: MET   PLAN:  PT FREQUENCY: 1x/week  PT DURATION:  8 sessions  PLANNED INTERVENTIONS: Therapeutic exercises, Therapeutic activity, Neuromuscular re-education, Balance training, Gait training, Patient/Family education, Self Care, Aquatic Therapy, Dry Needling, Spinal mobilization, Cryotherapy, Moist heat, scar mobilization, Taping, Biofeedback, and Manual therapy  PLAN FOR NEXT SESSION: manual work at perineal mobility, scar mobility, core and hip strengthening, coordination of pelvic floor and breathing mechanics   Otelia Sergeant, PT, DPT 07/01/247:37 PM

## 2022-10-16 ENCOUNTER — Encounter: Payer: Self-pay | Admitting: Hematology and Oncology

## 2022-10-22 ENCOUNTER — Ambulatory Visit: Payer: BC Managed Care – PPO | Admitting: Physical Therapy

## 2022-10-22 DIAGNOSIS — R279 Unspecified lack of coordination: Secondary | ICD-10-CM | POA: Diagnosis not present

## 2022-10-22 DIAGNOSIS — R293 Abnormal posture: Secondary | ICD-10-CM

## 2022-10-22 DIAGNOSIS — M6281 Muscle weakness (generalized): Secondary | ICD-10-CM

## 2022-10-22 DIAGNOSIS — M62838 Other muscle spasm: Secondary | ICD-10-CM

## 2022-10-22 NOTE — Therapy (Signed)
OUTPATIENT PHYSICAL THERAPY FEMALE PELVIC TREATMENT   Patient Name: Sandy Goodwin MRN: [id number] DOB:[birth year], 52 y.o., female Today's Date: 10/22/2022     END OF SESSION:  PT End of Session - 10/22/22 0802     Visit Number 11    Date for PT Re-Evaluation 01/11/23    Authorization Type [employer]    PT Start Time 0800    PT Stop Time 0838    PT Time Calculation (min) 38 min    Activity Tolerance Patient tolerated treatment well    Behavior During Therapy [facility] for tasks assessed/performed             Past Medical History:  Diagnosis Date   Full incontinence-feces    History of anemia    History of cancer chemotherapy    right breast;   neo-adjuvent 07-24-2020  to 11-15-2020  and 12-05-2020  to 07-24-2021   History of external beam radiation therapy    right reast 02-04-2021  to 03-25-2021   History of gastric ulcer    remote hx -- resolved   Hypothyroidism, postsurgical    followed by pcp;     02/ 2012  s/p  right hemithyroidectomy  (follicular adenoma)   Malignant neoplasm of upper-outer quadrant of right breast in female, estrogen receptor positive (HCC) 06/2020   oncologist--- dr Pamelia Hoit;  DCIS high grade right breast , neo-adjuvant chemo completed 11-15-2020,  s/p right breast lumpectomy w/ node dissection on 12-11-2020, then chemo completed 07-24-2021 and radiation completed 03-25-2021   Polyarthralgia    PONV (postoperative nausea and vomiting)    With first surgery, none since   Prolapse of vaginal vault after hysterectomy    and posterior prolapse   RA (rheumatoid arthritis) (HCC)    rheumatologsit--- dr Dierdre Forth;  no treatment   Tachycardia    Tachycardia   Past Surgical History:  Procedure Laterality Date   ANTERIOR AND POSTERIOR REPAIR WITH SACROSPINOUS FIXATION N/A 01/26/2022   Procedure: POSTERIOR REPAIR WITH SACROSPINOUS FIXATION;  Surgeon: Marguerita Beards, MD;  Location: [facility] [place];  Service: Gynecology;  Laterality: N/A;  total time  requested is 2 hours   APPENDECTOMY  08/2006   exploratory laparatomy w/ appendectomy (ruptured) and repair bowel   BREAST BIOPSY Right 07/30/2022   MM RT BREAST BX W LOC DEV 1ST LESION IMAGE BX SPEC STEREO GUIDE 07/30/2022 [facility]   BREAST LUMPECTOMY WITH RADIOACTIVE SEED AND SENTINEL LYMPH NODE BIOPSY Right 12/11/2020   Procedure: RIGHT BREAST LUMPECTOMY WITH RADIOACTIVE SEED X2 AND RIGHT AXILLARY SENTINEL LYMPH NODE BIOPSY;  Surgeon: Emelia Loron, MD;  Location: [facility];  Service: General;  Laterality: Right;   BREAST RECONSTRUCTION Right 12/20/2020   Procedure: RIGHT ONCOPLASTIC RECONSTRUCTION;  Surgeon: Glenna Fellows, MD;  Location: [facility];  Service: Plastics;  Laterality: Right;   BREAST REDUCTION SURGERY Left 12/20/2020   Procedure: LEFT BREAST REDUCTION;  Surgeon: Glenna Fellows, MD;  Location: [facility];  Service: Plastics;  Laterality: Left;   COLONOSCOPY N/A 06/15/2016   Procedure: COLONOSCOPY;  Surgeon: Malissa Hippo, MD;  Location[facility];  Service: Endoscopy;  Laterality: N/A;  845   DILATION AND CURETTAGE OF UTERUS  06/03/2011   w/ suction for miscarriage   EXCISION OF BREAST BIOPSY Right 1999   INCISION AND DRAINAGE OF WOUND Right 01/10/2021   Procedure: INCISION AND DRAINAGE OF RIGHT AXILLA;  Surgeon: Glenna Fellows, MD;  Location: [facility];  Service: Plastics;  Laterality: Right;  OVUM / OOCYTE RETRIEVAL     2010  and 2012   PERINEOPLASTY N/A 01/26/2022   Procedure: PERINEORRHAPY;  Surgeon: Marguerita Beards, MD;  Location: [facility];  Service: Gynecology;  Laterality: N/A;   PERINEOPLASTY N/A 02/19/2022   Procedure: PERINEOPLASTY revision;  Surgeon: Marguerita Beards, MD;  Location: [facility];  Service: Gynecology;  Laterality: N/A;   PORTACATH PLACEMENT Left 07/23/2020   Procedure: INSERTION PORT-A-CATH;  Surgeon: Emelia Loron, MD;  Location: [facility];  Service: General;  Laterality: Left;   THYROID LOBECTOMY Right 05/2010   @[facility]   VAGINAL HYSTERECTOMY  03/21/2019   @ [facility] by dr Mora Appl;   w/ Rogelio Seen culdoplasty   Patient Active Problem List   Diagnosis Date Noted   Prolapse of posterior vaginal wall 01/26/2022   Uterine prolapse 03/20/2021   Polyarthralgia 03/20/2021   Port-A-Cath in place 02/06/2021   Genetic testing 07/17/2020   Malignant neoplasm of upper-outer quadrant of right breast in female, estrogen receptor positive (HCC) 07/12/2020   Change in bowel habits 06/12/2016   Mixed hyperlipidemia 01/07/2016   Hypothyroidism, unspecified 01/07/2016   Allergic rhinitis 04/02/2015   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    PCP: Richardean Chimera, MD  REFERRING PROVIDER: Selmer Dominion, NP  REFERRING DIAG: [phone] (ICD-10-CM) - Post-operative state  THERAPY DIAG:  Other muscle spasm  Abnormal posture  Muscle weakness (generalized)  Unspecified lack of coordination  Rationale for Evaluation and Treatment: Rehabilitation  ONSET DATE: noticed at last pelvic exam in [place]  SUBJECTIVE:                                                                                                                                                                                           SUBJECTIVE STATEMENT: Reports decreased pain overall, having appointments closer together is helpful and had noted improvement. Has been doing wand and HEP.   PAIN:  Are you having pain? No - at this time. Hasn't had sex recently   PRECAUTIONS: Other: breast CA - not currently getting treatments   WEIGHT BEARING RESTRICTIONS: No  FALLS:  Has patient fallen in last 6 months? No  LIVING ENVIRONMENT: Lives with: lives with their family   OCCUPATION: PA  PLOF: Independent  PATIENT GOALS: to have no pain with sex  PERTINENT HISTORY:   Malignant neoplasm of upper-outer quadrant of  right breast in female, estrogen receptor positive (HCC)  Surgery: s/p Posterior repair, sacrospinous ligament fixation, perineorrhaphy, anal sphincteroplasty on 01/26/22 S/p EUA and revision perineoplasty on 02/21/22.  Sexual abuse: No  BOWEL MOVEMENT: Pain with bowel movement: No Type of  bowel movement: normal for pt Fully empty rectum: Yes:   Leakage: No Pads: No Fiber supplement: No  URINATION: Pain with urination: No Fully empty bladder: Yes:   Stream: Strong Urgency: No Frequency: not quicker than every 2 hours Leakage:  none Pads: No  INTERCOURSE: Pain with intercourse: Initial Penetration and During Penetration Ability to have vaginal penetration:  Yes: but painful Climax: not really due to pain Marinoff Scale: 2/3  PREGNANCY: Vaginal deliveries 2 Tearing Yes: 3rd deg tear posterior with first and anterior second  C-section deliveries 0 Currently pregnant No  PROLAPSE: None   OBJECTIVE:   DIAGNOSTIC FINDINGS:    COGNITION: Overall cognitive status: Within functional limits for tasks assessed     SENSATION: Light touch: Appears intact Proprioception: Appears intact  MUSCLE LENGTH: Hamstrings and adductors limited by 25%    POSTURE: rounded shoulders, forward head, and flexed trunk   LUMBARAROM/PROM:  A/PROM A/PROM  eval  Flexion Limited by 25%  Extension WFL  Right lateral flexion WFL  Left lateral flexion WFL  Right rotation WFL  Left rotation WFL   (Blank rows = not tested)  LOWER EXTREMITY ROM:  WFL  LOWER EXTREMITY MMT:  Hip abduction 3+/5 bil, all other 4/5, knees 5/5 PALPATION:   General  no TTP externally                 External Perineal Exam mild TTP at posterior vaginal opening externally 10/08/22 - TTP at bil labia minora however reports she needs to add her cream externally to help with tissue mobility and moisture.                              Internal Pelvic Floor bil bulbocavernosus, at vaginal opening at clock  face 8-4 locations, no TTP at deep layers except near coccyx. perineal scar tissue restriction/tenderness,  10/08/22 - Lt bulbocavernosus, tension most felt at 4-5 on clock face of vaginal opening, also TTP throughout Lt pubococcygeus possible puborectalis with trigger points noted. Improving perineal scar mobility with mild restrictions now.   Patient confirms identification and approves PT to assess internal pelvic floor and treatment Yes  PELVIC MMT:   MMT eval 10/08/22   Vaginal 3/5, 8s, 10 reps 3/5, 10s, 10 reps - however focus of training has been improved pain management with decreased tension  Internal Anal Sphincter    External Anal Sphincter    Puborectalis    Diastasis Recti    (Blank rows = not tested)        TONE: Pelvic floor atrophy   PROLAPSE: Not seen in hooklying   TODAY'S TREATMENT:                                                                                                                              DATE: 10/22/22    Manual - Patient consented to internal pelvic floor treatment vaginally this date. manual  work at posterior vaginal opening with most tension today at midline posteriorly at vaginal opening. Gentle stretching and trigger point release completed at Lt  bulbocavernosus. Gentle downward stretch at perineal body ant/post directions for mobility for improved mobility. This demonstrated improved mobility of tissue and pt denied increased pain.   PATIENT EDUCATION:  Education details: [id number] Person educated: Patient Education method: Explanation, Demonstration, Actor cues, Verbal cues, and Handouts Education comprehension: verbalized understanding and returned demonstration  HOME EXERCISE PROGRAM: [id number]    ASSESSMENT:  CLINICAL IMPRESSION: Patient presents for treatment today. Pt session focused on manual work at vaginal opening for decreased pain and improved tissue mobility for tolerance to medical exams and intercourse. Pt tolerated  manual work well - demonstrated improved mobility today compared to last session. Did not feel as much tension throughout lt bulbocavernosus, most tension at midline vaginal opening in downward direction and perineal body. Pt would benefit from additional PT to continue to progress toward goals.   OBJECTIVE IMPAIRMENTS: decreased coordination, decreased endurance, decreased mobility, decreased strength, increased fascial restrictions, increased muscle spasms, impaired flexibility, improper body mechanics, postural dysfunction, and pain.   ACTIVITY LIMITATIONS: sitting and intercourse, medical assessments  with internal needs  PARTICIPATION LIMITATIONS: interpersonal relationship and community activity  PERSONAL FACTORS: 1 comorbidity: medical history  are also affecting patient's functional outcome.   REHAB POTENTIAL: Good  CLINICAL DECISION MAKING: Stable/uncomplicated  EVALUATION COMPLEXITY: Low   GOALS: Goals reviewed with patient? Yes  SHORT TERM GOALS: Target date: 5//10/24  Pt to be I with advanced HEP.  Baseline: Goal status: MET  2.  Pt will report no more than 5/10 pain due to improvements in posture, strength, and muscle length  Baseline:  Goal status: MET   LONG TERM GOALS: Target date: 10/23/22  Pt to be I with advanced HEP.  Baseline:  Goal status: on going 2.  Pt will report no more than 2/10 pain due to improvements in posture, strength, and muscle length  Baseline:  Goal status: on going  3.  Pt to report improved tolerance to vaginal penetration for improved ability to be able to climax at least 75% of the instance of intercourse.  Baseline:  Goal status: on going  4.  Pt to be I with manual self stretching of perineal body/vaginal opening to improve tissue mobility and decrease pain at home.  Baseline:  Goal status: MET  5.  Pt to demonstrate at least 4/5 pelvic floor strength with ability to fully relax post contraction for improved pelvic stability  and decreased strain at pelvic floor/ decrease leakage.  Baseline:  Goal status: on going  6.  Pt to demonstrate at least 5/5 bil hip strength with good coordination of pelvic floor and breathing  for improved pelvic stability and functional squats without leakage.   Baseline:  Goal status: MET   PLAN:  PT FREQUENCY: 1x/week  PT DURATION:  8 sessions  PLANNED INTERVENTIONS: Therapeutic exercises, Therapeutic activity, Neuromuscular re-education, Balance training, Gait training, Patient/Family education, Self Care, Aquatic Therapy, Dry Needling, Spinal mobilization, Cryotherapy, Moist heat, scar mobilization, Taping, Biofeedback, and Manual therapy  PLAN FOR NEXT SESSION: manual work at perineal mobility, scar mobility, core and hip strengthening, coordination of pelvic floor and breathing mechanics   Otelia Sergeant, PT, DPT 10/21/2408:43 AM

## 2022-11-12 ENCOUNTER — Ambulatory Visit: Payer: BC Managed Care – PPO | Attending: General Surgery | Admitting: Physical Therapy

## 2022-11-12 DIAGNOSIS — R293 Abnormal posture: Secondary | ICD-10-CM

## 2022-11-12 DIAGNOSIS — M6281 Muscle weakness (generalized): Secondary | ICD-10-CM | POA: Diagnosis present

## 2022-11-12 DIAGNOSIS — Z170 Estrogen receptor positive status [ER+]: Secondary | ICD-10-CM | POA: Insufficient documentation

## 2022-11-12 DIAGNOSIS — C50411 Malignant neoplasm of upper-outer quadrant of right female breast: Secondary | ICD-10-CM | POA: Insufficient documentation

## 2022-11-12 DIAGNOSIS — M62838 Other muscle spasm: Secondary | ICD-10-CM | POA: Diagnosis present

## 2022-11-12 NOTE — Therapy (Signed)
OUTPATIENT PHYSICAL THERAPY FEMALE PELVIC TREATMENT   Patient Name: Sandy Goodwin MRN: [id number] DOB:Sep 03, 1970, 52 y.o., female Today's Date: 11/12/2022     END OF SESSION:  PT End of Session - 11/12/22 0927     Visit Number 12    Date for PT Re-Evaluation 01/11/23    Authorization Type [employer]    PT Start Time 0847    PT Stop Time 0925    PT Time Calculation (min) 38 min    Activity Tolerance Patient tolerated treatment well    Behavior During Therapy [facility] for tasks assessed/performed             Past Medical History:  Diagnosis Date   Full incontinence-feces    History of anemia    History of cancer chemotherapy    right breast;   neo-adjuvent 07-24-2020  to 11-15-2020  and 12-05-2020  to 07-24-2021   History of external beam radiation therapy    right reast 02-04-2021  to 03-25-2021   History of gastric ulcer    remote hx -- resolved   Hypothyroidism, postsurgical    followed by pcp;     02/ 2012  s/p  right hemithyroidectomy  (follicular adenoma)   Malignant neoplasm of upper-outer quadrant of right breast in female, estrogen receptor positive (HCC) 06/2020   oncologist--- dr Pamelia Hoit;  DCIS high grade right breast , neo-adjuvant chemo completed 11-15-2020,  s/p right breast lumpectomy w/ node dissection on 12-11-2020, then chemo completed 07-24-2021 and radiation completed 03-25-2021   Polyarthralgia    PONV (postoperative nausea and vomiting)    With first surgery, none since   Prolapse of vaginal vault after hysterectomy    and posterior prolapse   RA (rheumatoid arthritis) (HCC)    rheumatologsit--- dr Dierdre Forth;  no treatment   Tachycardia    Tachycardia   Past Surgical History:  Procedure Laterality Date   ANTERIOR AND POSTERIOR REPAIR WITH SACROSPINOUS FIXATION N/A 01/26/2022   Procedure: POSTERIOR REPAIR WITH SACROSPINOUS FIXATION;  Surgeon: Marguerita Beards, MD;  Location: [facility];  Service: Gynecology;  Laterality: N/A;  total time  requested is 2 hours   APPENDECTOMY  08/2006   exploratory laparatomy w/ appendectomy (ruptured) and repair bowel   BREAST BIOPSY Right 07/30/2022   MM RT BREAST BX W LOC DEV 1ST LESION IMAGE BX SPEC STEREO GUIDE 07/30/2022 [facility]   BREAST LUMPECTOMY WITH RADIOACTIVE SEED AND SENTINEL LYMPH NODE BIOPSY Right 12/11/2020   Procedure: RIGHT BREAST LUMPECTOMY WITH RADIOACTIVE SEED X2 AND RIGHT AXILLARY SENTINEL LYMPH NODE BIOPSY;  Surgeon: Emelia Loron, MD;  Location: [facility];  Service: General;  Laterality: Right;   BREAST RECONSTRUCTION Right 12/20/2020   Procedure: RIGHT ONCOPLASTIC RECONSTRUCTION;  Surgeon: Glenna Fellows, MD;  Location: [facility];  Service: Plastics;  Laterality: Right;   BREAST REDUCTION SURGERY Left 12/20/2020   Procedure: LEFT BREAST REDUCTION;  Surgeon: Glenna Fellows, MD;  Location: [facility];  Service: Plastics;  Laterality: Left;   COLONOSCOPY N/A 06/15/2016   Procedure: COLONOSCOPY;  Surgeon: Malissa Hippo, MD;  [facility];  Service: Endoscopy;  Laterality: N/A;  845   DILATION AND CURETTAGE OF UTERUS  06/03/2011   w/ suction for miscarriage   EXCISION OF BREAST BIOPSY Right 1999   INCISION AND DRAINAGE OF WOUND Right 01/10/2021   Procedure: INCISION AND DRAINAGE OF RIGHT AXILLA;  Surgeon: Glenna Fellows, MD;  Location: [facility];  Service: Plastics;  Laterality: Right;  OVUM / OOCYTE RETRIEVAL     2010  and 2012   PERINEOPLASTY N/A 01/26/2022   Procedure: PERINEORRHAPY;  Surgeon: Marguerita Beards, MD;  Location: [facility];  Service: Gynecology;  Laterality: N/A;   PERINEOPLASTY N/A 02/19/2022   Procedure: PERINEOPLASTY revision;  Surgeon: Marguerita Beards, MD;  Location: [facility];  Service: Gynecology;  Laterality: N/A;   PORTACATH PLACEMENT Left 07/23/2020   Procedure: INSERTION PORT-A-CATH;  Surgeon: Emelia Loron, MD;  Location: [facility];  Service: General;  Laterality: Left;   THYROID LOBECTOMY Right 05/2010   @[facility]   VAGINAL HYSTERECTOMY  03/21/2019   @ [facility] by dr Mora Appl;   w/ Rogelio Seen culdoplasty   Patient Active Problem List   Diagnosis Date Noted   Prolapse of posterior vaginal wall 01/26/2022   Uterine prolapse 03/20/2021   Polyarthralgia 03/20/2021   Port-A-Cath in place 02/06/2021   Genetic testing 07/17/2020   Malignant neoplasm of upper-outer quadrant of right breast in female, estrogen receptor positive (HCC) 07/12/2020   Change in bowel habits 06/12/2016   Mixed hyperlipidemia 01/07/2016   Hypothyroidism, unspecified 01/07/2016   Allergic rhinitis 04/02/2015   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    PCP: Richardean Chimera, MD  REFERRING PROVIDER: Selmer Dominion, NP  REFERRING DIAG: [phone] (ICD-10-CM) - Post-operative state  THERAPY DIAG:  Other muscle spasm  Abnormal posture  Muscle weakness (generalized)  Rationale for Evaluation and Treatment: Rehabilitation  ONSET DATE: noticed at last pelvic exam in [place]  SUBJECTIVE:                                                                                                                                                                                           SUBJECTIVE STATEMENT: Pain overall improved but still has one small spot that hurts with penetration. Also forgot to use estrogen from Thursday to last night and had a bulge sensation return vaginally.   PAIN:  Are you having pain? No - at this time. Hasn't had sex recently   PRECAUTIONS: Other: breast CA - not currently getting treatments   WEIGHT BEARING RESTRICTIONS: No  FALLS:  Has patient fallen in last 6 months? No  LIVING ENVIRONMENT: Lives with: lives with their family   OCCUPATION: PA  PLOF: Independent  PATIENT GOALS: to have no pain with sex  PERTINENT HISTORY:   Malignant neoplasm of upper-outer  quadrant of right breast in female, estrogen receptor positive (HCC)  Surgery: s/p Posterior repair, sacrospinous ligament fixation, perineorrhaphy, anal sphincteroplasty on 01/26/22 S/p EUA and revision perineoplasty on 02/21/22.  Sexual abuse: No  BOWEL MOVEMENT: Pain with  bowel movement: No Type of bowel movement: normal for pt Fully empty rectum: Yes:   Leakage: No Pads: No Fiber supplement: No  URINATION: Pain with urination: No Fully empty bladder: Yes:   Stream: Strong Urgency: No Frequency: not quicker than every 2 hours Leakage:  none Pads: No  INTERCOURSE: Pain with intercourse: Initial Penetration and During Penetration Ability to have vaginal penetration:  Yes: but painful Climax: not really due to pain Marinoff Scale: 2/3  PREGNANCY: Vaginal deliveries 2 Tearing Yes: 3rd deg tear posterior with first and anterior second  C-section deliveries 0 Currently pregnant No  PROLAPSE: None   OBJECTIVE:   DIAGNOSTIC FINDINGS:    COGNITION: Overall cognitive status: Within functional limits for tasks assessed     SENSATION: Light touch: Appears intact Proprioception: Appears intact  MUSCLE LENGTH: Hamstrings and adductors limited by 25%    POSTURE: rounded shoulders, forward head, and flexed trunk   LUMBARAROM/PROM:  A/PROM A/PROM  eval  Flexion Limited by 25%  Extension WFL  Right lateral flexion WFL  Left lateral flexion WFL  Right rotation WFL  Left rotation WFL   (Blank rows = not tested)  LOWER EXTREMITY ROM:  WFL  LOWER EXTREMITY MMT:  Hip abduction 3+/5 bil, all other 4/5, knees 5/5 PALPATION:   General  no TTP externally                 External Perineal Exam mild TTP at posterior vaginal opening externally 10/08/22 - TTP at bil labia minora however reports she needs to add her cream externally to help with tissue mobility and moisture.                              Internal Pelvic Floor bil bulbocavernosus, at vaginal opening  at clock face 8-4 locations, no TTP at deep layers except near coccyx. perineal scar tissue restriction/tenderness,  10/08/22 - Lt bulbocavernosus, tension most felt at 4-5 on clock face of vaginal opening, also TTP throughout Lt pubococcygeus possible puborectalis with trigger points noted. Improving perineal scar mobility with mild restrictions now.   Patient confirms identification and approves PT to assess internal pelvic floor and treatment Yes  PELVIC MMT:   MMT eval 10/08/22   Vaginal 3/5, 8s, 10 reps 3/5, 10s, 10 reps - however focus of training has been improved pain management with decreased tension  Internal Anal Sphincter    External Anal Sphincter    Puborectalis    Diastasis Recti    (Blank rows = not tested)        TONE: Pelvic floor atrophy   PROLAPSE: Not seen in hooklying   TODAY'S TREATMENT:                                                                                                                              DATE: 11/12/22  Manual - Patient consented to internal pelvic floor treatment vaginally  this date. manual work at posterior vaginal opening with most tension today at midline just at left side but overall improved since starting PT. Gentle stretching and trigger point release completed at Lt  bulbocavernosus. Gentle downward stretch at perineal body ant/post directions for mobility for improved mobility. Pt stated she had return of bulge sensation vaginally this past weekend and reports no known strain or reason to cause this. Assessed in hooklying at rest no laxity seen, with cough anterior wall laxity noted, with contraction and cough improvement noted. Also assessed in standing with resting anterior wall laxity noted, worse with cough and slight improvement with contraction and cough. Pt reports at end of day feels it more consistently but has returned to normal schedule of estrogen cream and will see if this helps. Did have pt attempt x10 pelvic floor  contractions with good technique however does have continued weakness and superficial layer mostly contracting with less contraction noted at deeper layer, with quick release assistance pt did demonstrate improved lift with contraction at deeper layer however fatigued after 5 reps. Pt reports she was told not to do kegels after surgery and unsure if this restriction was lifted or note.   PT also messaged provider about this as well as additional recommendations with continued tension and pain at single area vaginally. Awaiting response at time of documentation.   PATIENT EDUCATION:  Education details: [id number] Person educated: Patient Education method: Explanation, Demonstration, Actor cues, Verbal cues, and Handouts Education comprehension: verbalized understanding and returned demonstration  HOME EXERCISE PROGRAM: [id number]    ASSESSMENT:  CLINICAL IMPRESSION: Patient presents for treatment today. Pt session focused on manual work at vaginal opening for decreased pain and improved tissue mobility for tolerance to medical exams and intercourse. Pt tolerated manual work well - demonstrated improved mobility today compared to last session. Also assessed vaginal bugle sensation pt reports and does have anterior wall laxity in hooklying and standing, provider messaged about this and awaiting recommendations. Will call pt with response of medical provider. Pt would benefit from additional PT to continue to progress toward goals.   OBJECTIVE IMPAIRMENTS: decreased coordination, decreased endurance, decreased mobility, decreased strength, increased fascial restrictions, increased muscle spasms, impaired flexibility, improper body mechanics, postural dysfunction, and pain.   ACTIVITY LIMITATIONS: sitting and intercourse, medical assessments  with internal needs  PARTICIPATION LIMITATIONS: interpersonal relationship and community activity  PERSONAL FACTORS: 1 comorbidity: medical history  are also  affecting patient's functional outcome.   REHAB POTENTIAL: Good  CLINICAL DECISION MAKING: Stable/uncomplicated  EVALUATION COMPLEXITY: Low   GOALS: Goals reviewed with patient? Yes  SHORT TERM GOALS: Target date: 5//10/24  Pt to be I with advanced HEP.  Baseline: Goal status: MET  2.  Pt will report no more than 5/10 pain due to improvements in posture, strength, and muscle length  Baseline:  Goal status: MET   LONG TERM GOALS: Target date: 10/23/22  Pt to be I with advanced HEP.  Baseline:  Goal status: on going 2.  Pt will report no more than 2/10 pain due to improvements in posture, strength, and muscle length  Baseline:  Goal status: on going  3.  Pt to report improved tolerance to vaginal penetration for improved ability to be able to climax at least 75% of the instance of intercourse.  Baseline:  Goal status: on going  4.  Pt to be I with manual self stretching of perineal body/vaginal opening to improve tissue mobility and decrease pain at home.  Baseline:  Goal status:  MET  5.  Pt to demonstrate at least 4/5 pelvic floor strength with ability to fully relax post contraction for improved pelvic stability and decreased strain at pelvic floor/ decrease leakage.  Baseline:  Goal status: on going  6.  Pt to demonstrate at least 5/5 bil hip strength with good coordination of pelvic floor and breathing  for improved pelvic stability and functional squats without leakage.   Baseline:  Goal status: MET   PLAN:  PT FREQUENCY: 1x/week  PT DURATION:  8 sessions  PLANNED INTERVENTIONS: Therapeutic exercises, Therapeutic activity, Neuromuscular re-education, Balance training, Gait training, Patient/Family education, Self Care, Aquatic Therapy, Dry Needling, Spinal mobilization, Cryotherapy, Moist heat, scar mobilization, Taping, Biofeedback, and Manual therapy  PLAN FOR NEXT SESSION: manual work at perineal mobility, scar mobility, core and hip strengthening,  coordination of pelvic floor and breathing mechanics   Otelia Sergeant, PT, DPT 08/01/249:37 AM

## 2022-11-19 ENCOUNTER — Ambulatory Visit: Payer: BC Managed Care – PPO | Admitting: Physical Therapy

## 2022-11-26 ENCOUNTER — Ambulatory Visit: Payer: BC Managed Care – PPO | Attending: Obstetrics and Gynecology | Admitting: Physical Therapy

## 2022-11-26 DIAGNOSIS — R293 Abnormal posture: Secondary | ICD-10-CM | POA: Insufficient documentation

## 2022-11-26 DIAGNOSIS — M62838 Other muscle spasm: Secondary | ICD-10-CM | POA: Insufficient documentation

## 2022-11-26 DIAGNOSIS — R279 Unspecified lack of coordination: Secondary | ICD-10-CM | POA: Diagnosis present

## 2022-11-26 DIAGNOSIS — M6281 Muscle weakness (generalized): Secondary | ICD-10-CM | POA: Diagnosis present

## 2022-11-26 NOTE — Therapy (Signed)
OUTPATIENT PHYSICAL THERAPY FEMALE PELVIC TREATMENT   Patient Name: Sandy Goodwin MRN: [id number] DOB:1971/03/25, 52 y.o., female Today's Date: 11/26/2022     END OF SESSION:  PT End of Session - 11/26/22 1533     Visit Number 13    Date for PT Re-Evaluation 01/11/23    Authorization Type [employer]    PT Start Time 1530    PT Stop Time 1610    PT Time Calculation (min) 40 min    Activity Tolerance Patient tolerated treatment well    Behavior During Therapy [facility] for tasks assessed/performed             Past Medical History:  Diagnosis Date   Full incontinence-feces    History of anemia    History of cancer chemotherapy    right breast;   neo-adjuvent 07-24-2020  to 11-15-2020  and 12-05-2020  to 07-24-2021   History of external beam radiation therapy    right reast 02-04-2021  to 03-25-2021   History of gastric ulcer    remote hx -- resolved   Hypothyroidism, postsurgical    followed by pcp;     02/ 2012  s/p  right hemithyroidectomy  (follicular adenoma)   Malignant neoplasm of upper-outer quadrant of right breast in female, estrogen receptor positive (HCC) 06/2020   oncologist--- dr Pamelia Hoit;  DCIS high grade right breast , neo-adjuvant chemo completed 11-15-2020,  s/p right breast lumpectomy w/ node dissection on 12-11-2020, then chemo completed 07-24-2021 and radiation completed 03-25-2021   Polyarthralgia    PONV (postoperative nausea and vomiting)    With first surgery, none since   Prolapse of vaginal vault after hysterectomy    and posterior prolapse   RA (rheumatoid arthritis) (HCC)    rheumatologsit--- dr Dierdre Forth;  no treatment   Tachycardia    Tachycardia   Past Surgical History:  Procedure Laterality Date   ANTERIOR AND POSTERIOR REPAIR WITH SACROSPINOUS FIXATION N/A 01/26/2022   Procedure: POSTERIOR REPAIR WITH SACROSPINOUS FIXATION;  Surgeon: Marguerita Beards, MD;  Location: [facility];  Service: Gynecology;  Laterality: N/A;  total time  requested is 2 hours   APPENDECTOMY  08/2006   exploratory laparatomy w/ appendectomy (ruptured) and repair bowel   BREAST BIOPSY Right 07/30/2022   MM RT BREAST BX W LOC DEV 1ST LESION IMAGE BX SPEC STEREO GUIDE 07/30/2022 [facility]   BREAST LUMPECTOMY WITH RADIOACTIVE SEED AND SENTINEL LYMPH NODE BIOPSY Right 12/11/2020   Procedure: RIGHT BREAST LUMPECTOMY WITH RADIOACTIVE SEED X2 AND RIGHT AXILLARY SENTINEL LYMPH NODE BIOPSY;  Surgeon: Emelia Loron, MD;  Location: [facility];  Service: General;  Laterality: Right;   BREAST RECONSTRUCTION Right 12/20/2020   Procedure: RIGHT ONCOPLASTIC RECONSTRUCTION;  Surgeon: Glenna Fellows, MD;  Location: [facility];  Service: Plastics;  Laterality: Right;   BREAST REDUCTION SURGERY Left 12/20/2020   Procedure: LEFT BREAST REDUCTION;  Surgeon: Glenna Fellows, MD;  Location: [facility];  Service: Plastics;  Laterality: Left;   COLONOSCOPY N/A 06/15/2016   Procedure: COLONOSCOPY;  Surgeon: Malissa Hippo, MD;  [facility];  Service: Endoscopy;  Laterality: N/A;  845   DILATION AND CURETTAGE OF UTERUS  06/03/2011   w/ suction for miscarriage   EXCISION OF BREAST BIOPSY Right 1999   INCISION AND DRAINAGE OF WOUND Right 01/10/2021   Procedure: INCISION AND DRAINAGE OF RIGHT AXILLA;  Surgeon: Glenna Fellows, MD;  Location: [facility];  Service: Plastics;  Laterality: Right;  OVUM / OOCYTE RETRIEVAL     2010  and 2012   PERINEOPLASTY N/A 01/26/2022   Procedure: PERINEORRHAPY;  Surgeon: Marguerita Beards, MD;  Location: [facility];  Service: Gynecology;  Laterality: N/A;   PERINEOPLASTY N/A 02/19/2022   Procedure: PERINEOPLASTY revision;  Surgeon: Marguerita Beards, MD;  Location: [facility];  Service: Gynecology;  Laterality: N/A;   PORTACATH PLACEMENT Left 07/23/2020   Procedure: INSERTION PORT-A-CATH;  Surgeon: Emelia Loron, MD;  Location: [facility];  Service: General;  Laterality: Left;   THYROID LOBECTOMY Right 05/2010   @[facility]   VAGINAL HYSTERECTOMY  03/21/2019   @ [facility] by dr Mora Appl;   w/ Rogelio Seen culdoplasty   Patient Active Problem List   Diagnosis Date Noted   Prolapse of posterior vaginal wall 01/26/2022   Uterine prolapse 03/20/2021   Polyarthralgia 03/20/2021   Port-A-Cath in place 02/06/2021   Genetic testing 07/17/2020   Malignant neoplasm of upper-outer quadrant of right breast in female, estrogen receptor positive (HCC) 07/12/2020   Change in bowel habits 06/12/2016   Mixed hyperlipidemia 01/07/2016   Hypothyroidism, unspecified 01/07/2016   Allergic rhinitis 04/02/2015   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    PCP: Richardean Chimera, MD  REFERRING PROVIDER: Selmer Dominion, NP  REFERRING DIAG: [phone] (ICD-10-CM) - Post-operative state  THERAPY DIAG:  Other muscle spasm  Abnormal posture  Muscle weakness (generalized)  Rationale for Evaluation and Treatment: Rehabilitation  ONSET DATE: noticed at last pelvic exam in [place]  SUBJECTIVE:                                                                                                                                                                                           SUBJECTIVE STATEMENT: With using wand before intercourse and use of lubricant able to have it, but unable without this due to pain 2-3/10 with intercourse with wand. Does report bulge still present. Plans to call gyn about this.   PT reached out to office and updated on this as well - provider aware.   PAIN:  Are you having pain? No - at this time. Hasn't had sex recently   PRECAUTIONS: Other: breast CA - not currently getting treatments   WEIGHT BEARING RESTRICTIONS: No  FALLS:  Has patient fallen in last 6 months? No  LIVING ENVIRONMENT: Lives with: lives with their family   OCCUPATION: PA  PLOF:  Independent  PATIENT GOALS: to have no pain with sex  PERTINENT HISTORY:   Malignant neoplasm of upper-outer quadrant of right breast in female, estrogen receptor positive (HCC)  Surgery: s/p Posterior repair, sacrospinous ligament  fixation, perineorrhaphy, anal sphincteroplasty on 01/26/22 S/p EUA and revision perineoplasty on 02/21/22.  Sexual abuse: No  BOWEL MOVEMENT: Pain with bowel movement: No Type of bowel movement: normal for pt Fully empty rectum: Yes:   Leakage: No Pads: No Fiber supplement: No  URINATION: Pain with urination: No Fully empty bladder: Yes:   Stream: Strong Urgency: No Frequency: not quicker than every 2 hours Leakage:  none Pads: No  INTERCOURSE: Pain with intercourse: Initial Penetration and During Penetration Ability to have vaginal penetration:  Yes: but painful Climax: not really due to pain Marinoff Scale: 2/3  PREGNANCY: Vaginal deliveries 2 Tearing Yes: 3rd deg tear posterior with first and anterior second  C-section deliveries 0 Currently pregnant No  PROLAPSE: None   OBJECTIVE:   DIAGNOSTIC FINDINGS:    COGNITION: Overall cognitive status: Within functional limits for tasks assessed     SENSATION: Light touch: Appears intact Proprioception: Appears intact  MUSCLE LENGTH: Hamstrings and adductors limited by 25%    POSTURE: rounded shoulders, forward head, and flexed trunk   LUMBARAROM/PROM:  A/PROM A/PROM  eval  Flexion Limited by 25%  Extension WFL  Right lateral flexion WFL  Left lateral flexion WFL  Right rotation WFL  Left rotation WFL   (Blank rows = not tested)  LOWER EXTREMITY ROM:  WFL  LOWER EXTREMITY MMT:  Hip abduction 3+/5 bil, all other 4/5, knees 5/5 PALPATION:   General  no TTP externally                 External Perineal Exam mild TTP at posterior vaginal opening externally 10/08/22 - TTP at bil labia minora however reports she needs to add her cream externally to help with tissue  mobility and moisture.                              Internal Pelvic Floor bil bulbocavernosus, at vaginal opening at clock face 8-4 locations, no TTP at deep layers except near coccyx. perineal scar tissue restriction/tenderness,  10/08/22 - Lt bulbocavernosus, tension most felt at 4-5 on clock face of vaginal opening, also TTP throughout Lt pubococcygeus possible puborectalis with trigger points noted. Improving perineal scar mobility with mild restrictions now.   Patient confirms identification and approves PT to assess internal pelvic floor and treatment Yes  PELVIC MMT:   MMT eval 10/08/22  11/26/22   Vaginal 3/5, 8s, 10 reps 3/5, 10s, 10 reps - however focus of training has been improved pain management with decreased tension 4/5 5reps 8s  Internal Anal Sphincter     External Anal Sphincter     Puborectalis     Diastasis Recti     (Blank rows = not tested)        TONE: Pelvic floor atrophy   PROLAPSE: Not seen in hooklying  11/12/22 - anterior wall laxity with cough in hooklying and standing.   TODAY'S TREATMENT:  DATE: 11/26/22: manual work at posterior vaginal opening with gentle stretching for decreased pain with penetration. Focus of treatment in this area with cues for diaphragmatic breathing and decreased tension in gluteals and lower abdominal. Does still have pain in this localized area of bulbocavernosus region. Decreased tension noted after stretching  X10 pelvic floor contractions completed in hooklying with good technique and no bulge noted X10 pelvic floor contractions with isometrics with pt demonstrating ability to complete 8s for 5 reps then decreased to 6s but at 4/5 strength all in hooklying.  Educated pt on relief positions as needed for laxity, also to attempt pelvic floor contractions 2-3x10s unless pain worsens then stop. Pt agreed.    PATIENT EDUCATION:  Education details: [id number] Person educated: Patient Education method: Explanation, Demonstration, Actor cues, Verbal cues, and Handouts Education comprehension: verbalized understanding and returned demonstration  HOME EXERCISE PROGRAM: [id number]    ASSESSMENT:  CLINICAL IMPRESSION: Patient presents for treatment today. Pt session focused on manual work at vaginal opening for decreased pain and improved tissue mobility for tolerance to medical exams and intercourse. Pt tolerated manual work well. Bulge symptoms still present but not seen with pelvic floor contractions or manual work today however did not have pt attempt to strain at pelvic floor. Pt demonstrated good technique with pelvic floor contractions without breath holding. Pt agreeable to follow up with provider about bulge and additional treatment for pain at vaginal opening. Pt also agreeable and understanding education about pelvic floor contractions to improve strength and endurance of pelvic floor for improved support but to stop if pain is worse. Pt would benefit from additional PT to continue to progress toward goals.   OBJECTIVE IMPAIRMENTS: decreased coordination, decreased endurance, decreased mobility, decreased strength, increased fascial restrictions, increased muscle spasms, impaired flexibility, improper body mechanics, postural dysfunction, and pain.   ACTIVITY LIMITATIONS: sitting and intercourse, medical assessments  with internal needs  PARTICIPATION LIMITATIONS: interpersonal relationship and community activity  PERSONAL FACTORS: 1 comorbidity: medical history  are also affecting patient's functional outcome.   REHAB POTENTIAL: Good  CLINICAL DECISION MAKING: Stable/uncomplicated  EVALUATION COMPLEXITY: Low   GOALS: Goals reviewed with patient? Yes  SHORT TERM GOALS: Target date: 5//10/24  Pt to be I with advanced HEP.  Baseline: Goal status: MET  2.  Pt will report no more  than 5/10 pain due to improvements in posture, strength, and muscle length  Baseline:  Goal status: MET   LONG TERM GOALS: Target date: 10/23/22  Pt to be I with advanced HEP.  Baseline:  Goal status: on going 2.  Pt will report no more than 2/10 pain due to improvements in posture, strength, and muscle length  Baseline:  Goal status: on going  3.  Pt to report improved tolerance to vaginal penetration for improved ability to be able to climax at least 75% of the instance of intercourse.  Baseline:  Goal status: on going  4.  Pt to be I with manual self stretching of perineal body/vaginal opening to improve tissue mobility and decrease pain at home.  Baseline:  Goal status: MET  5.  Pt to demonstrate at least 4/5 pelvic floor strength with ability to fully relax post contraction for improved pelvic stability and decreased strain at pelvic floor/ decrease leakage.  Baseline:  Goal status: on going  6.  Pt to demonstrate at least 5/5 bil hip strength with good coordination of pelvic floor and breathing  for improved pelvic stability and functional squats without leakage.   Baseline:  Goal status: MET   PLAN:  PT FREQUENCY: 1x/week  PT DURATION:  8 sessions  PLANNED INTERVENTIONS: Therapeutic exercises, Therapeutic activity, Neuromuscular re-education, Balance training, Gait training, Patient/Family education, Self Care, Aquatic Therapy, Dry Needling, Spinal mobilization, Cryotherapy, Moist heat, scar mobilization, Taping, Biofeedback, and Manual therapy  PLAN FOR NEXT SESSION: manual work at perineal mobility, scar mobility, core and hip strengthening, coordination of pelvic floor and breathing mechanics   Otelia Sergeant, PT, DPT 08/15/244:14 PM

## 2022-11-30 ENCOUNTER — Ambulatory Visit: Payer: BC Managed Care – PPO

## 2022-12-02 ENCOUNTER — Other Ambulatory Visit: Payer: Self-pay | Admitting: Obstetrics and Gynecology

## 2022-12-02 DIAGNOSIS — N898 Other specified noninflammatory disorders of vagina: Secondary | ICD-10-CM

## 2022-12-03 ENCOUNTER — Ambulatory Visit: Payer: BC Managed Care – PPO | Admitting: Physical Therapy

## 2022-12-03 DIAGNOSIS — R279 Unspecified lack of coordination: Secondary | ICD-10-CM

## 2022-12-03 DIAGNOSIS — R293 Abnormal posture: Secondary | ICD-10-CM

## 2022-12-03 DIAGNOSIS — M62838 Other muscle spasm: Secondary | ICD-10-CM | POA: Diagnosis not present

## 2022-12-03 DIAGNOSIS — M6281 Muscle weakness (generalized): Secondary | ICD-10-CM

## 2022-12-03 NOTE — Therapy (Signed)
OUTPATIENT PHYSICAL THERAPY FEMALE PELVIC TREATMENT   Patient Name: FATEMEH DEVERAUX MRN: [id number] DOB:[birth year], 52 y.o., female Today's Date: 12/03/2022     END OF SESSION:  PT End of Session - 12/03/22 0848     Visit Number 14    Date for PT Re-Evaluation 01/11/23    Authorization Type [employer]    PT Start Time 0845    PT Stop Time 0926    PT Time Calculation (min) 41 min    Activity Tolerance Patient tolerated treatment well    Behavior During Therapy [facility] for tasks assessed/performed             Past Medical History:  Diagnosis Date   Full incontinence-feces    History of anemia    History of cancer chemotherapy    right breast;   neo-adjuvent 07-24-2020  to 11-15-2020  and 12-05-2020  to 07-24-2021   History of external beam radiation therapy    right reast 02-04-2021  to 03-25-2021   History of gastric ulcer    remote hx -- resolved   Hypothyroidism, postsurgical    followed by pcp;     02/ 2012  s/p  right hemithyroidectomy  (follicular adenoma)   Malignant neoplasm of upper-outer quadrant of right breast in female, estrogen receptor positive (HCC) 06/2020   oncologist--- dr Pamelia Hoit;  DCIS high grade right breast , neo-adjuvant chemo completed 11-15-2020,  s/p right breast lumpectomy w/ node dissection on 12-11-2020, then chemo completed 07-24-2021 and radiation completed 03-25-2021   Polyarthralgia    PONV (postoperative nausea and vomiting)    With first surgery, none since   Prolapse of vaginal vault after hysterectomy    and posterior prolapse   RA (rheumatoid arthritis) (HCC)    rheumatologsit--- dr Dierdre Forth;  no treatment   Tachycardia    Tachycardia   Past Surgical History:  Procedure Laterality Date   ANTERIOR AND POSTERIOR REPAIR WITH SACROSPINOUS FIXATION N/A 01/26/2022   Procedure: POSTERIOR REPAIR WITH SACROSPINOUS FIXATION;  Surgeon: Marguerita Beards, MD;  Location: [facility];  Service: Gynecology;  Laterality: N/A;  total time  requested is 2 hours   APPENDECTOMY  08/2006   exploratory laparatomy w/ appendectomy (ruptured) and repair bowel   BREAST BIOPSY Right 07/30/2022   MM RT BREAST BX W LOC DEV 1ST LESION IMAGE BX SPEC STEREO GUIDE 07/30/2022 [facility]   BREAST LUMPECTOMY WITH RADIOACTIVE SEED AND SENTINEL LYMPH NODE BIOPSY Right 12/11/2020   Procedure: RIGHT BREAST LUMPECTOMY WITH RADIOACTIVE SEED X2 AND RIGHT AXILLARY SENTINEL LYMPH NODE BIOPSY;  Surgeon: Emelia Loron, MD;  Location: [facility];  Service: General;  Laterality: Right;   BREAST RECONSTRUCTION Right 12/20/2020   Procedure: RIGHT ONCOPLASTIC RECONSTRUCTION;  Surgeon: Glenna Fellows, MD;  Location: [facility];  Service: Plastics;  Laterality: Right;   BREAST REDUCTION SURGERY Left 12/20/2020   Procedure: LEFT BREAST REDUCTION;  Surgeon: Glenna Fellows, MD;  Location: [facility];  Service: Plastics;  Laterality: Left;   COLONOSCOPY N/A 06/15/2016   Procedure: COLONOSCOPY;  Surgeon: Malissa Hippo, MD;  [facility];  Service: Endoscopy;  Laterality: N/A;  845   DILATION AND CURETTAGE OF UTERUS  06/03/2011   w/ suction for miscarriage   EXCISION OF BREAST BIOPSY Right 1999   INCISION AND DRAINAGE OF WOUND Right 01/10/2021   Procedure: INCISION AND DRAINAGE OF RIGHT AXILLA;  Surgeon: Glenna Fellows, MD;  Location: [facility];  Service: Plastics;  Laterality: Right;  OVUM / OOCYTE RETRIEVAL     2010  and 2012   PERINEOPLASTY N/A 01/26/2022   Procedure: PERINEORRHAPY;  Surgeon: Marguerita Beards, MD;  Location: [facility];  Service: Gynecology;  Laterality: N/A;   PERINEOPLASTY N/A 02/19/2022   Procedure: PERINEOPLASTY revision;  Surgeon: Marguerita Beards, MD;  Location: [facility];  Service: Gynecology;  Laterality: N/A;   PORTACATH PLACEMENT Left 07/23/2020   Procedure: INSERTION PORT-A-CATH;  Surgeon: Emelia Loron, MD;  Location: [facility];  Service: General;  Laterality: Left;   THYROID LOBECTOMY Right 05/2010   @[facility]   VAGINAL HYSTERECTOMY  03/21/2019   @ [facility] by dr Mora Appl;   w/ Rogelio Seen culdoplasty   Patient Active Problem List   Diagnosis Date Noted   Prolapse of posterior vaginal wall 01/26/2022   Uterine prolapse 03/20/2021   Polyarthralgia 03/20/2021   Port-A-Cath in place 02/06/2021   Genetic testing 07/17/2020   Malignant neoplasm of upper-outer quadrant of right breast in female, estrogen receptor positive (HCC) 07/12/2020   Change in bowel habits 06/12/2016   Mixed hyperlipidemia 01/07/2016   Hypothyroidism, unspecified 01/07/2016   Allergic rhinitis 04/02/2015   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 Florian Buff for next week and pleased with this. Plans to discuss additional intervention for one small area of continued pain and tension at vaginal opening. Pt also does continue to have heaviness feeling vaginally, does report she is able to do kegels and help with sensation, does relief positions as needed as well.   PAIN:  Are you having pain? No - at this time. Hasn't had sex recently   PRECAUTIONS: Other: breast CA - not currently getting treatments   WEIGHT BEARING RESTRICTIONS: No  FALLS:  Has patient fallen in last 6 months? No  LIVING ENVIRONMENT: Lives  with: lives with their family   OCCUPATION: PA  PLOF: Independent  PATIENT GOALS: to have no pain with sex  PERTINENT HISTORY:   Malignant neoplasm of upper-outer quadrant of right breast in female, estrogen receptor positive (HCC)  Surgery: s/p Posterior repair, sacrospinous ligament fixation, perineorrhaphy, anal sphincteroplasty on 01/26/22 S/p EUA and revision perineoplasty on 02/21/22.  Sexual abuse: No  BOWEL MOVEMENT: Pain with bowel movement: No Type of bowel movement: normal for pt Fully empty rectum: Yes:   Leakage: No Pads: No Fiber supplement: No  URINATION: Pain with urination: No Fully empty bladder: Yes:   Stream: Strong Urgency: No Frequency: not quicker than every 2 hours Leakage:  none Pads: No  INTERCOURSE: Pain with intercourse: Initial Penetration and During Penetration Ability to have vaginal penetration:  Yes: but painful Climax: not really due to pain Marinoff Scale: 2/3  PREGNANCY: Vaginal deliveries 2 Tearing Yes: 3rd deg tear posterior with first and anterior second  C-section deliveries 0 Currently pregnant No  PROLAPSE: None   OBJECTIVE:   DIAGNOSTIC FINDINGS:    COGNITION: Overall cognitive status: Within functional limits for tasks assessed     SENSATION: Light touch: Appears intact Proprioception: Appears intact  MUSCLE LENGTH: Hamstrings and adductors limited by 25%    POSTURE: rounded shoulders, forward head, and flexed trunk   LUMBARAROM/PROM:  A/PROM A/PROM  eval  Flexion Limited by 25%  Extension WFL  Right lateral flexion WFL  Left lateral flexion WFL  Right rotation WFL  Left rotation WFL   (Blank rows = not tested)  LOWER EXTREMITY ROM:  WFL  LOWER EXTREMITY MMT:  Hip abduction 3+/5 bil, all other 4/5, knees 5/5 PALPATION:   General  no TTP externally                 External Perineal Exam mild TTP at posterior vaginal opening externally 10/08/22 - TTP at bil labia minora however reports  she needs to add her cream externally to help with tissue mobility and moisture.                              Internal Pelvic Floor bil bulbocavernosus, at vaginal opening at clock face 8-4 locations, no TTP at deep layers except near coccyx. perineal scar tissue restriction/tenderness,  10/08/22 - Lt bulbocavernosus, tension most felt at 4-5 on clock face of vaginal opening, also TTP throughout Lt pubococcygeus possible puborectalis with trigger points noted. Improving perineal scar mobility with mild restrictions now.   Patient confirms identification and approves PT to assess internal pelvic floor and treatment Yes  PELVIC MMT:   MMT eval 10/08/22  11/26/22   Vaginal 3/5, 8s, 10 reps 3/5, 10s, 10 reps - however focus of training has been improved pain management with decreased tension 4/5 5reps 8s  Internal Anal Sphincter     External Anal Sphincter     Puborectalis     Diastasis Recti     (Blank rows = not tested)        TONE: Pelvic floor atrophy   PROLAPSE: Not seen in hooklying  11/12/22 - anterior wall laxity with cough in hooklying and standing.   TODAY'S TREATMENT:  DATE: 12/03/22  manual work at posterior vaginal opening with gentle stretching for decreased pain with penetration. Focus of treatment in this area with cues for diaphragmatic breathing and decreased tension in gluteals and lower abdominal. Does still have pain in this small localized area of bulbocavernosus region. Decreased tension noted after stretching and decreased pain with palpation.   2X10 pelvic floor contractions completed in hooklying with good technique and no bulge noted x5 pelvic floor contractions with isometrics with pt demonstrating 10s contractions with 4/5 strength initially but decreased to 3/5 after 6-8s.    PATIENT EDUCATION:  Education details: [id number] Person  educated: Patient Education method: Explanation, Demonstration, Actor cues, Verbal cues, and Handouts Education comprehension: verbalized understanding and returned demonstration  HOME EXERCISE PROGRAM: [id number]    ASSESSMENT:  CLINICAL IMPRESSION: Patient presents for treatment today. Pt session focused on manual work at vaginal opening for decreased pain and improved tissue mobility for tolerance to medical exams and intercourse. Pt tolerated manual work well. heaviness still felt by pt. Pt demonstrated good technique with pelvic floor contractions without breath holding. Pt has been doing pelvic floor contractions at home, reports this helps some. Has follow up with MD next week. Pt would benefit from additional PT to continue to progress toward goals.   OBJECTIVE IMPAIRMENTS: decreased coordination, decreased endurance, decreased mobility, decreased strength, increased fascial restrictions, increased muscle spasms, impaired flexibility, improper body mechanics, postural dysfunction, and pain.   ACTIVITY LIMITATIONS: sitting and intercourse, medical assessments  with internal needs  PARTICIPATION LIMITATIONS: interpersonal relationship and community activity  PERSONAL FACTORS: 1 comorbidity: medical history  are also affecting patient's functional outcome.   REHAB POTENTIAL: Good  CLINICAL DECISION MAKING: Stable/uncomplicated  EVALUATION COMPLEXITY: Low   GOALS: Goals reviewed with patient? Yes  SHORT TERM GOALS: Target date: 5//10/24  Pt to be I with advanced HEP.  Baseline: Goal status: MET  2.  Pt will report no more than 5/10 pain due to improvements in posture, strength, and muscle length  Baseline:  Goal status: MET   LONG TERM GOALS: Target date: 10/23/22  Pt to be I with advanced HEP.  Baseline:  Goal status: on going 2.  Pt will report no more than 2/10 pain due to improvements in posture, strength, and muscle length  Baseline:  Goal status: on  going  3.  Pt to report improved tolerance to vaginal penetration for improved ability to be able to climax at least 75% of the instance of intercourse.  Baseline:  Goal status: on going  4.  Pt to be I with manual self stretching of perineal body/vaginal opening to improve tissue mobility and decrease pain at home.  Baseline:  Goal status: MET  5.  Pt to demonstrate at least 4/5 pelvic floor strength with ability to fully relax post contraction for improved pelvic stability and decreased strain at pelvic floor/ decrease leakage.  Baseline:  Goal status: on going  6.  Pt to demonstrate at least 5/5 bil hip strength with good coordination of pelvic floor and breathing  for improved pelvic stability and functional squats without leakage.   Baseline:  Goal status: MET   PLAN:  PT FREQUENCY: 1x/week  PT DURATION:  8 sessions  PLANNED INTERVENTIONS: Therapeutic exercises, Therapeutic activity, Neuromuscular re-education, Balance training, Gait training, Patient/Family education, Self Care, Aquatic Therapy, Dry Needling, Spinal mobilization, Cryotherapy, Moist heat, scar mobilization, Taping, Biofeedback, and Manual therapy  PLAN FOR NEXT SESSION: manual work at perineal mobility, scar mobility, core and hip strengthening, coordination  of pelvic floor and breathing mechanics   Otelia Sergeant, PT, DPT 08/22/249:32 AM

## 2022-12-07 ENCOUNTER — Ambulatory Visit: Payer: BC Managed Care – PPO | Admitting: Physical Therapy

## 2022-12-07 DIAGNOSIS — R279 Unspecified lack of coordination: Secondary | ICD-10-CM

## 2022-12-07 DIAGNOSIS — M6281 Muscle weakness (generalized): Secondary | ICD-10-CM

## 2022-12-07 DIAGNOSIS — M62838 Other muscle spasm: Secondary | ICD-10-CM

## 2022-12-07 NOTE — Therapy (Signed)
OUTPATIENT PHYSICAL THERAPY FEMALE PELVIC TREATMENT   Patient Name: Sandy Goodwin MRN: [id number] DOB:1971/01/21, 52 y.o., female Today's Date: 12/07/2022     END OF SESSION:  PT End of Session - 12/07/22 1311     Visit Number 15    Date for PT Re-Evaluation 01/11/23    Authorization Type BC[employer]    PT Start Time 1234    PT Stop Time 1307    PT Time Calculation (min) 33 min    Activity Tolerance Patient tolerated treatment well    Behavior During Therapy [facility] for tasks assessed/performed             Past Medical History:  Diagnosis Date   Full incontinence-feces    History of anemia    History of cancer chemotherapy    right breast;   neo-adjuvent 07-24-2020  to 11-15-2020  and 12-05-2020  to 07-24-2021   History of external beam radiation therapy    right reast 02-04-2021  to 03-25-2021   History of gastric ulcer    remote hx -- resolved   Hypothyroidism, postsurgical    followed by pcp;     02/ 2012  s/p  right hemithyroidectomy  (follicular adenoma)   Malignant neoplasm of upper-outer quadrant of right breast in female, estrogen receptor positive (HCC) 06/2020   oncologist--- dr Pamelia Hoit;  DCIS high grade right breast , neo-adjuvant chemo completed 11-15-2020,  s/p right breast lumpectomy w/ node dissection on 12-11-2020, then chemo completed 07-24-2021 and radiation completed 03-25-2021   Polyarthralgia    PONV (postoperative nausea and vomiting)    With first surgery, none since   Prolapse of vaginal vault after hysterectomy    and posterior prolapse   RA (rheumatoid arthritis) (HCC)    rheumatologsit--- dr Dierdre Forth;  no treatment   Tachycardia    Tachycardia   Past Surgical History:  Procedure Laterality Date   ANTERIOR AND POSTERIOR REPAIR WITH SACROSPINOUS FIXATION N/A 01/26/2022   Procedure: POSTERIOR REPAIR WITH SACROSPINOUS FIXATION;  Surgeon: Marguerita Beards, MD;  Location: [facility];  Service: Gynecology;  Laterality: N/A;  total time  requested is 2 hours   APPENDECTOMY  08/2006   exploratory laparatomy w/ appendectomy (ruptured) and repair bowel   BREAST BIOPSY Right 07/30/2022   MM RT BREAST BX W LOC DEV 1ST LESION IMAGE BX SPEC STEREO GUIDE 07/30/2022 [facility]   BREAST LUMPECTOMY WITH RADIOACTIVE SEED AND SENTINEL LYMPH NODE BIOPSY Right 12/11/2020   Procedure: RIGHT BREAST LUMPECTOMY WITH RADIOACTIVE SEED X2 AND RIGHT AXILLARY SENTINEL LYMPH NODE BIOPSY;  Surgeon: Emelia Loron, MD;  Location: [facility];  Service: General;  Laterality: Right;   BREAST RECONSTRUCTION Right 12/20/2020   Procedure: RIGHT ONCOPLASTIC RECONSTRUCTION;  Surgeon: Glenna Fellows, MD;  Location: [facility];  Service: Plastics;  Laterality: Right;   BREAST REDUCTION SURGERY Left 12/20/2020   Procedure: LEFT BREAST REDUCTION;  Surgeon: Glenna Fellows, MD;  Location: [facility];  Service: Plastics;  Laterality: Left;   COLONOSCOPY N/A 06/15/2016   Procedure: COLONOSCOPY;  Surgeon: Malissa Hippo, MD;  [facility];  Service: Endoscopy;  Laterality: N/A;  845   DILATION AND CURETTAGE OF UTERUS  06/03/2011   w/ suction for miscarriage   EXCISION OF BREAST BIOPSY Right 1999   INCISION AND DRAINAGE OF WOUND Right 01/10/2021   Procedure: INCISION AND DRAINAGE OF RIGHT AXILLA;  Surgeon: Glenna Fellows, MD;  Location: [facility];  Service: Plastics;  Laterality: Right;  OVUM / OOCYTE RETRIEVAL     2010  and 2012   PERINEOPLASTY N/A 01/26/2022   Procedure: PERINEORRHAPY;  Surgeon: Marguerita Beards, MD;  Location: [facility];  Service: Gynecology;  Laterality: N/A;   PERINEOPLASTY N/A 02/19/2022   Procedure: PERINEOPLASTY revision;  Surgeon: Marguerita Beards, MD;  Location: [facility];  Service: Gynecology;  Laterality: N/A;   PORTACATH PLACEMENT Left 07/23/2020   Procedure: INSERTION PORT-A-CATH;  Surgeon: Emelia Loron, MD;  Location: [facility];  Service: General;  Laterality: Left;   THYROID LOBECTOMY Right 05/2010   @[facility]   VAGINAL HYSTERECTOMY  03/21/2019   @ [facility] by dr Mora Appl;   w/ Rogelio Seen culdoplasty   Patient Active Problem List   Diagnosis Date Noted   Prolapse of posterior vaginal wall 01/26/2022   Uterine prolapse 03/20/2021   Polyarthralgia 03/20/2021   Port-A-Cath in place 02/06/2021   Genetic testing 07/17/2020   Malignant neoplasm of upper-outer quadrant of right breast in female, estrogen receptor positive (HCC) 07/12/2020   Change in bowel habits 06/12/2016   Mixed hyperlipidemia 01/07/2016   Hypothyroidism, unspecified 01/07/2016   Allergic rhinitis 04/02/2015   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    PCP: Richardean Chimera, MD  REFERRING PROVIDER: Selmer Dominion, NP  REFERRING DIAG: [phone] (ICD-10-CM) - Post-operative state  THERAPY DIAG:  Unspecified lack of coordination  Other muscle spasm  Muscle weakness (generalized)  Rationale for Evaluation and Treatment: Rehabilitation  ONSET DATE: noticed at last pelvic exam in [place]  SUBJECTIVE:                                                                                                                                                                                           SUBJECTIVE STATEMENT: Reports similar symptom but has noted improved mobility with wand after internal work. Still able to have intercourse after wand use but not without. Pt has follow up with MD for bulge sensation and possible intervention for remaining pain at small area of introitus   PAIN:  Are you having pain? No - at this time. Hasn't had sex recently   PRECAUTIONS: Other: breast CA - not currently getting treatments   WEIGHT BEARING RESTRICTIONS: No  FALLS:  Has patient fallen in last 6 months? No  LIVING ENVIRONMENT: Lives with: lives with their family   OCCUPATION: PA  PLOF:  Independent  PATIENT GOALS: to have no pain with sex  PERTINENT HISTORY:   Malignant neoplasm of upper-outer quadrant of right breast in female, estrogen receptor positive (HCC)  Surgery: s/p Posterior repair, sacrospinous ligament fixation, perineorrhaphy, anal sphincteroplasty on 01/26/22  S/p EUA and revision perineoplasty on 02/21/22.  Sexual abuse: No  BOWEL MOVEMENT: Pain with bowel movement: No Type of bowel movement: normal for pt Fully empty rectum: Yes:   Leakage: No Pads: No Fiber supplement: No  URINATION: Pain with urination: No Fully empty bladder: Yes:   Stream: Strong Urgency: No Frequency: not quicker than every 2 hours Leakage:  none Pads: No  INTERCOURSE: Pain with intercourse: Initial Penetration and During Penetration Ability to have vaginal penetration:  Yes: but painful Climax: not really due to pain Marinoff Scale: 2/3  PREGNANCY: Vaginal deliveries 2 Tearing Yes: 3rd deg tear posterior with first and anterior second  C-section deliveries 0 Currently pregnant No  PROLAPSE: None   OBJECTIVE:   DIAGNOSTIC FINDINGS:    COGNITION: Overall cognitive status: Within functional limits for tasks assessed     SENSATION: Light touch: Appears intact Proprioception: Appears intact  MUSCLE LENGTH: Hamstrings and adductors limited by 25%    POSTURE: rounded shoulders, forward head, and flexed trunk   LUMBARAROM/PROM:  A/PROM A/PROM  eval  Flexion Limited by 25%  Extension WFL  Right lateral flexion WFL  Left lateral flexion WFL  Right rotation WFL  Left rotation WFL   (Blank rows = not tested)  LOWER EXTREMITY ROM:  WFL  LOWER EXTREMITY MMT:  Hip abduction 3+/5 bil, all other 4/5, knees 5/5 PALPATION:   General  no TTP externally                 External Perineal Exam mild TTP at posterior vaginal opening externally 10/08/22 - TTP at bil labia minora however reports she needs to add her cream externally to help with tissue  mobility and moisture.                              Internal Pelvic Floor bil bulbocavernosus, at vaginal opening at clock face 8-4 locations, no TTP at deep layers except near coccyx. perineal scar tissue restriction/tenderness,  10/08/22 - Lt bulbocavernosus, tension most felt at 4-5 on clock face of vaginal opening, also TTP throughout Lt pubococcygeus possible puborectalis with trigger points noted. Improving perineal scar mobility with mild restrictions now.   Patient confirms identification and approves PT to assess internal pelvic floor and treatment Yes  PELVIC MMT:   MMT eval 10/08/22  11/26/22   Vaginal 3/5, 8s, 10 reps 3/5, 10s, 10 reps - however focus of training has been improved pain management with decreased tension 4/5 5reps 8s  Internal Anal Sphincter     External Anal Sphincter     Puborectalis     Diastasis Recti     (Blank rows = not tested)        TONE: Pelvic floor atrophy   PROLAPSE: Not seen in hooklying  11/12/22 - anterior wall laxity with cough in hooklying and standing.   TODAY'S TREATMENT:  DATE: 12/07/22  manual work at posterior vaginal opening with gentle stretching for decreased pain with penetration. Focus of treatment in this area with cues for diaphragmatic breathing and decreased tension in gluteals and lower abdominal. Does still have pain in this small localized area of bulbocavernosus region. Noted improved mobility compared to last session however this was only 4 days ago. More time on strengthening of pelvic floor today as well.   3X10 pelvic floor contractions completed in hooklying with good technique and no bulge noted x5 pelvic floor contractions with isometrics with pt demonstrating 10s contractions with 4/5 strength initially but decreased to 3/5 after 8s with reps 8-10.    PATIENT EDUCATION:  Education details:  [id number] Person educated: Patient Education method: Explanation, Demonstration, Actor cues, Verbal cues, and Handouts Education comprehension: verbalized understanding and returned demonstration  HOME EXERCISE PROGRAM: [id number]    ASSESSMENT:  CLINICAL IMPRESSION: Patient presents for treatment today. Pt session focused on manual work at vaginal opening for decreased pain and improved tissue mobility for tolerance to medical exams and intercourse. Pt tolerated manual work well. heaviness still felt by pt. Pt demonstrated good technique with pelvic floor contractions without breath holding. Demonstrated more mobility at bulbocavernosus today and tolerated manual work well, more time with strengthening today also tolerated well. Pt does still have weakness present. Pt also educated during on coordinating exhale with pelvic floor contraction for stressors. Pt reported she will try this.  Pt would benefit from additional PT to continue to progress toward goals.   OBJECTIVE IMPAIRMENTS: decreased coordination, decreased endurance, decreased mobility, decreased strength, increased fascial restrictions, increased muscle spasms, impaired flexibility, improper body mechanics, postural dysfunction, and pain.   ACTIVITY LIMITATIONS: sitting and intercourse, medical assessments  with internal needs  PARTICIPATION LIMITATIONS: interpersonal relationship and community activity  PERSONAL FACTORS: 1 comorbidity: medical history  are also affecting patient's functional outcome.   REHAB POTENTIAL: Good  CLINICAL DECISION MAKING: Stable/uncomplicated  EVALUATION COMPLEXITY: Low   GOALS: Goals reviewed with patient? Yes  SHORT TERM GOALS: Target date: 5//10/24  Pt to be I with advanced HEP.  Baseline: Goal status: MET  2.  Pt will report no more than 5/10 pain due to improvements in posture, strength, and muscle length  Baseline:  Goal status: MET   LONG TERM GOALS: Target date: 10/23/22  Pt  to be I with advanced HEP.  Baseline:  Goal status: on going 2.  Pt will report no more than 2/10 pain due to improvements in posture, strength, and muscle length  Baseline:  Goal status: on going  3.  Pt to report improved tolerance to vaginal penetration for improved ability to be able to climax at least 75% of the instance of intercourse.  Baseline:  Goal status: on going  4.  Pt to be I with manual self stretching of perineal body/vaginal opening to improve tissue mobility and decrease pain at home.  Baseline:  Goal status: MET  5.  Pt to demonstrate at least 4/5 pelvic floor strength with ability to fully relax post contraction for improved pelvic stability and decreased strain at pelvic floor/ decrease leakage.  Baseline:  Goal status: on going  6.  Pt to demonstrate at least 5/5 bil hip strength with good coordination of pelvic floor and breathing  for improved pelvic stability and functional squats without leakage.   Baseline:  Goal status: MET   PLAN:  PT FREQUENCY: 1x/week  PT DURATION:  8 sessions  PLANNED INTERVENTIONS: Therapeutic exercises, Therapeutic activity, Neuromuscular  re-education, Balance training, Gait training, Patient/Family education, Self Care, Aquatic Therapy, Dry Needling, Spinal mobilization, Cryotherapy, Moist heat, scar mobilization, Taping, Biofeedback, and Manual therapy  PLAN FOR NEXT SESSION: manual work at perineal mobility, scar mobility, core and hip strengthening, coordination of pelvic floor and breathing mechanics   Otelia Sergeant, PT, DPT 08/26/241:17 PM

## 2022-12-10 ENCOUNTER — Ambulatory Visit: Payer: BC Managed Care – PPO | Admitting: Physical Therapy

## 2022-12-10 DIAGNOSIS — Z170 Estrogen receptor positive status [ER+]: Secondary | ICD-10-CM

## 2022-12-10 NOTE — Therapy (Signed)
OUTPATIENT PHYSICAL THERAPY SOZO SCREENING NOTE   Patient Name: Sandy Goodwin MRN: [id number] DOB:January 09, 1971, 52 y.o., female Today's Date: 12/10/2022  PCP: Richardean Chimera, MD REFERRING PROVIDER: Emelia Loron, MD   PT End of Session - 12/10/22 1406     Visit Number 15   unchanged screen only   PT Start Time 1401    PT Stop Time 1406    PT Time Calculation (min) 5 min    Activity Tolerance Patient tolerated treatment well    Behavior During Therapy [facility] for tasks assessed/performed             Past Medical History:  Diagnosis Date   Full incontinence-feces    History of anemia    History of cancer chemotherapy    right breast;   neo-adjuvent 07-24-2020  to 11-15-2020  and 12-05-2020  to 07-24-2021   History of external beam radiation therapy    right reast 02-04-2021  to 03-25-2021   History of gastric ulcer    remote hx -- resolved   Hypothyroidism, postsurgical    followed by pcp;     02/ 2012  s/p  right hemithyroidectomy  (follicular adenoma)   Malignant neoplasm of upper-outer quadrant of right breast in female, estrogen receptor positive (HCC) 06/2020   oncologist--- dr Pamelia Hoit;  DCIS high grade right breast , neo-adjuvant chemo completed 11-15-2020,  s/p right breast lumpectomy w/ node dissection on 12-11-2020, then chemo completed 07-24-2021 and radiation completed 03-25-2021   Polyarthralgia    PONV (postoperative nausea and vomiting)    With first surgery, none since   Prolapse of vaginal vault after hysterectomy    and posterior prolapse   RA (rheumatoid arthritis) (HCC)    rheumatologsit--- dr Dierdre Forth;  no treatment   Tachycardia    Tachycardia   Past Surgical History:  Procedure Laterality Date   ANTERIOR AND POSTERIOR REPAIR WITH SACROSPINOUS FIXATION N/A 01/26/2022   Procedure: POSTERIOR REPAIR WITH SACROSPINOUS FIXATION;  Surgeon: Marguerita Beards, MD;  Location: [facility];  Service: Gynecology;  Laterality: N/A;  total time  requested is 2 hours   APPENDECTOMY  08/2006   exploratory laparatomy w/ appendectomy (ruptured) and repair bowel   BREAST BIOPSY Right 07/30/2022   MM RT BREAST BX W LOC DEV 1ST LESION IMAGE BX SPEC STEREO GUIDE 07/30/2022 [facility]   BREAST LUMPECTOMY WITH RADIOACTIVE SEED AND SENTINEL LYMPH NODE BIOPSY Right 12/11/2020   Procedure: RIGHT BREAST LUMPECTOMY WITH RADIOACTIVE SEED X2 AND RIGHT AXILLARY SENTINEL LYMPH NODE BIOPSY;  Surgeon: Emelia Loron, MD;  Location: [facility];  Service: General;  Laterality: Right;   BREAST RECONSTRUCTION Right 12/20/2020   Procedure: RIGHT ONCOPLASTIC RECONSTRUCTION;  Surgeon: Glenna Fellows, MD;  Location: [facility];  Service: Plastics;  Laterality: Right;   BREAST REDUCTION SURGERY Left 12/20/2020   Procedure: LEFT BREAST REDUCTION;  Surgeon: Glenna Fellows, MD;  Location: [facility];  Service: Plastics;  Laterality: Left;   COLONOSCOPY N/A 06/15/2016   Procedure: COLONOSCOPY;  Surgeon: Malissa Hippo, MD;  [facility];  Service: Endoscopy;  Laterality: N/A;  845   DILATION AND CURETTAGE OF UTERUS  06/03/2011   w/ suction for miscarriage   EXCISION OF BREAST BIOPSY Right 1999   INCISION AND DRAINAGE OF WOUND Right 01/10/2021   Procedure: INCISION AND DRAINAGE OF RIGHT AXILLA;  Surgeon: Glenna Fellows, MD;  Location: [facility];  Service: Plastics;  Laterality: Right;   OVUM / OOCYTE  RETRIEVAL     2010  and 2012   PERINEOPLASTY N/A 01/26/2022   Procedure: PERINEORRHAPY;  Surgeon: Marguerita Beards, MD;  Location: [facility];  Service: Gynecology;  Laterality: N/A;   PERINEOPLASTY N/A 02/19/2022   Procedure: PERINEOPLASTY revision;  Surgeon: Marguerita Beards, MD;  Location: [facility];  Service: Gynecology;  Laterality: N/A;   PORTACATH PLACEMENT Left 07/23/2020   Procedure: INSERTION PORT-A-CATH;  Surgeon: Emelia Loron, MD;  Location: [facility];  Service: General;  Laterality: Left;   THYROID LOBECTOMY Right 05/2010   @[facility]   VAGINAL HYSTERECTOMY  03/21/2019   @ [facility] by dr Mora Appl;   w/ Rogelio Seen culdoplasty   Patient Active Problem List   Diagnosis Date Noted   Prolapse of posterior vaginal wall 01/26/2022   Uterine prolapse 03/20/2021   Polyarthralgia 03/20/2021   Port-A-Cath in place 02/06/2021   Genetic testing 07/17/2020   Malignant neoplasm of upper-outer quadrant of right breast in female, estrogen receptor positive (HCC) 07/12/2020   Change in bowel habits 06/12/2016   Mixed hyperlipidemia 01/07/2016   Hypothyroidism, unspecified 01/07/2016   Allergic rhinitis 04/02/2015   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 I wear my bra and occasionally do massage.   PAIN:  Are you having pain? No  SOZO SCREENING: Patient was assessed today using the SOZO machine to determine the lymphedema index score. This was compared to her baseline score. It was determined that she is within the recommended range when compared to her baseline and no further action is needed at this time. She will continue SOZO screenings. These are done every 3 months for 2 years post operatively followed by every 6 months for 2 years, and then annually.    [address] Catharine, PT 12/10/2022, 2:57 PM

## 2022-12-16 ENCOUNTER — Encounter: Payer: Self-pay | Admitting: Obstetrics and Gynecology

## 2022-12-16 ENCOUNTER — Ambulatory Visit (INDEPENDENT_AMBULATORY_CARE_PROVIDER_SITE_OTHER): Payer: BC Managed Care – PPO | Admitting: Obstetrics and Gynecology

## 2022-12-16 VITALS — BP 122/82 | HR 80

## 2022-12-16 DIAGNOSIS — N8110 Cystocele, unspecified: Secondary | ICD-10-CM | POA: Diagnosis not present

## 2022-12-16 DIAGNOSIS — N9089 Other specified noninflammatory disorders of vulva and perineum: Secondary | ICD-10-CM

## 2022-12-16 MED ORDER — TRIAMCINOLONE ACETONIDE 40 MG/ML IJ SUSP: 40.0000 mg | Freq: Once | INTRAMUSCULAR | Status: AC

## 2022-12-16 MED ORDER — BUPIVACAINE HCL 0.25 % IJ SOLN: 9.0000 mL | Freq: Once | INTRAMUSCULAR | Status: AC

## 2022-12-16 NOTE — Progress Notes (Signed)
[facility]  Date of Visit: 12/16/2022  History of Present Illness: Ms. Guyett is a 52 y.o. female scheduled today for a post-operative visit.   Surgery: s/p Posterior repair, sacrospinous ligament fixation, perineorrhaphy, anal sphincteroplasty on 01/26/22 S/p EUA and revision perineoplasty on 02/21/22.   Has been undergoing pelvic PT. Working on perineal scar mobility, but still has a small area at the opening that is very tender. No tenderness deeper inside. Sex is painful on penetration and has to use the wand prior to penetration.   Also feels a little bit of a bulge. Feels it when she is walking around on her feet mostly.    Medications: She has a current medication list which includes the following prescription(s): acetaminophen, anastrozole, bystolic, cetirizine, clobetasol ointment, cyclobenzaprine, estradiol, gabapentin, levothyroxine, lidocaine, metronidazole, NONFORMULARY OR COMPOUNDED ITEM, pantoprazole, polyethylene glycol 3350, tramadol, triamcinolone, and wegovy.   Allergies: Patient is allergic to codeine, hyaluronic acid [sodium hyaluronate], hydrocodone, other, latex, and wound dressing adhesive.   Physical Exam: BP 122/82   Pulse 80    Perineal Incision: intact On speculum exam,  well healed. Pinpoint area at the introitus that is tender to palpation, possible small nodule present. No tenderness on other pelvic floor muscles.   Perineum prepped with betadine and trigger point injected with bupivacaine and kenalog.   POP-Q  0                                            Aa   0                                           Ba  -5.5                                              C   3                                            Gh  5                                            Pb  7                                            tvl   -3                                            Ap  -3  Bp                                                  D       ---------------------------------------------------------  Assessment and Plan:  1. Prolapse of anterior vaginal wall   2. Scar, vulva     - Continue manual massage of perineum - trigger point injection performed today. Will have her return weekly for 6 weeks if this is helping the discomfort.  - For prolapse, we discussed repair via vaginal native tissue or mesh sacrocolpopexy. She is hesitant to use any mesh. She may be interested in repair in the future.   Marguerita Beards, MD  Time spent: I spent 20 minutes dedicated to the care of this patient on the date of this encounter to include pre-visit review of records, face-to-face time with the patient  and post visit documentation.

## 2022-12-17 ENCOUNTER — Ambulatory Visit: Payer: BC Managed Care – PPO | Attending: Obstetrics and Gynecology | Admitting: Physical Therapy

## 2022-12-17 DIAGNOSIS — R293 Abnormal posture: Secondary | ICD-10-CM | POA: Diagnosis present

## 2022-12-17 DIAGNOSIS — M62838 Other muscle spasm: Secondary | ICD-10-CM | POA: Diagnosis present

## 2022-12-17 DIAGNOSIS — M6281 Muscle weakness (generalized): Secondary | ICD-10-CM | POA: Diagnosis present

## 2022-12-17 DIAGNOSIS — R279 Unspecified lack of coordination: Secondary | ICD-10-CM | POA: Insufficient documentation

## 2022-12-17 NOTE — Therapy (Signed)
OUTPATIENT PHYSICAL THERAPY FEMALE PELVIC TREATMENT   Patient Name: Sandy Goodwin MRN: [id number] DOB:[birth year], 52 y.o., female Today's Date: 12/17/2022     END OF SESSION:  PT End of Session - 12/17/22 1446     Visit Number 15    Date for PT Re-Evaluation 01/11/23    Authorization Type BCBS    PT Start Time 1445    PT Stop Time 1526    PT Time Calculation (min) 41 min    Activity Tolerance Patient tolerated treatment well    Behavior During Therapy [facility] for tasks assessed/performed             Past Medical History:  Diagnosis Date   Full incontinence-feces    History of anemia    History of cancer chemotherapy    right breast;   neo-adjuvent 07-24-2020  to 11-15-2020  and 12-05-2020  to 07-24-2021   History of external beam radiation therapy    right reast 02-04-2021  to 03-25-2021   History of gastric ulcer    remote hx -- resolved   Hypothyroidism, postsurgical    followed by pcp;     02/ 2012  s/p  right hemithyroidectomy  (follicular adenoma)   Malignant neoplasm of upper-outer quadrant of right breast in female, estrogen receptor positive (HCC) 06/2020   oncologist--- dr Pamelia Hoit;  DCIS high grade right breast , neo-adjuvant chemo completed 11-15-2020,  s/p right breast lumpectomy w/ node dissection on 12-11-2020, then chemo completed 07-24-2021 and radiation completed 03-25-2021   Polyarthralgia    PONV (postoperative nausea and vomiting)    With first surgery, none since   Prolapse of vaginal vault after hysterectomy    and posterior prolapse   RA (rheumatoid arthritis) (HCC)    rheumatologsit--- dr Dierdre Forth;  no treatment   Tachycardia    Tachycardia   Past Surgical History:  Procedure Laterality Date   ANTERIOR AND POSTERIOR REPAIR WITH SACROSPINOUS FIXATION N/A 01/26/2022   Procedure: POSTERIOR REPAIR WITH SACROSPINOUS FIXATION;  Surgeon: Marguerita Beards, MD;  Location: [facility];  Service: Gynecology;  Laterality: N/A;  total time  requested is 2 hours   APPENDECTOMY  08/2006   exploratory laparatomy w/ appendectomy (ruptured) and repair bowel   BREAST BIOPSY Right 07/30/2022   MM RT BREAST BX W LOC DEV 1ST LESION IMAGE BX SPEC STEREO GUIDE 07/30/2022 [facility]   BREAST LUMPECTOMY WITH RADIOACTIVE SEED AND SENTINEL LYMPH NODE BIOPSY Right 12/11/2020   Procedure: RIGHT BREAST LUMPECTOMY WITH RADIOACTIVE SEED X2 AND RIGHT AXILLARY SENTINEL LYMPH NODE BIOPSY;  Surgeon: Emelia Loron, MD;  Location: [facility];  Service: General;  Laterality: Right;   BREAST RECONSTRUCTION Right 12/20/2020   Procedure: RIGHT ONCOPLASTIC RECONSTRUCTION;  Surgeon: Glenna Fellows, MD;  Location: [facility];  Service: Plastics;  Laterality: Right;   BREAST REDUCTION SURGERY Left 12/20/2020   Procedure: LEFT BREAST REDUCTION;  Surgeon: Glenna Fellows, MD;  Location: [facility];  Service: Plastics;  Laterality: Left;   COLONOSCOPY N/A 06/15/2016   Procedure: COLONOSCOPY;  Surgeon: Malissa Hippo, MD;  [facility];  Service: Endoscopy;  Laterality: N/A;  845   DILATION AND CURETTAGE OF UTERUS  06/03/2011   w/ suction for miscarriage   EXCISION OF BREAST BIOPSY Right 1999   INCISION AND DRAINAGE OF WOUND Right 01/10/2021   Procedure: INCISION AND DRAINAGE OF RIGHT AXILLA;  Surgeon: Glenna Fellows, MD;  Location: [facility];  Service: Plastics;  Laterality: Right;  OVUM / OOCYTE RETRIEVAL     2010  and 2012   PERINEOPLASTY N/A 01/26/2022   Procedure: PERINEORRHAPY;  Surgeon: Marguerita Beards, MD;  Location: [facility];  Service: Gynecology;  Laterality: N/A;   PERINEOPLASTY N/A 02/19/2022   Procedure: PERINEOPLASTY revision;  Surgeon: Marguerita Beards, MD;  Location: [facility];  Service: Gynecology;  Laterality: N/A;   PORTACATH PLACEMENT Left 07/23/2020   Procedure: INSERTION PORT-A-CATH;  Surgeon: Emelia Loron, MD;  Location: [facility];  Service: General;  Laterality: Left;   THYROID LOBECTOMY Right 05/2010   @[facility]   VAGINAL HYSTERECTOMY  03/21/2019   @ [facility] by dr Mora Appl;   w/ Rogelio Seen culdoplasty   Patient Active Problem List   Diagnosis Date Noted   Prolapse of posterior vaginal wall 01/26/2022   Uterine prolapse 03/20/2021   Polyarthralgia 03/20/2021   Port-A-Cath in place 02/06/2021   Genetic testing 07/17/2020   Malignant neoplasm of upper-outer quadrant of right breast in female, estrogen receptor positive (HCC) 07/12/2020   Change in bowel habits 06/12/2016   Mixed hyperlipidemia 01/07/2016   Hypothyroidism, unspecified 01/07/2016   Allergic rhinitis 04/02/2015   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    PCP: Richardean Chimera, MD  REFERRING PROVIDER: Selmer Dominion, NP  REFERRING DIAG: [phone] (ICD-10-CM) - Post-operative state  THERAPY DIAG:  Muscle weakness (generalized)  Other muscle spasm  Abnormal posture  Rationale for Evaluation and Treatment: Rehabilitation  ONSET DATE: noticed at last pelvic exam in [place]  SUBJECTIVE:                                                                                                                                                                                           SUBJECTIVE STATEMENT: Had trigger point injection yesterday and did have pain during this, thinks it helped with pain so far. Did confirm anterior grade 2 prolapse during appt as well.   PAIN:  Are you having pain? No - at this time. Hasn't had sex recently   PRECAUTIONS: Other: breast CA - not currently getting treatments   WEIGHT BEARING RESTRICTIONS: No  FALLS:  Has patient fallen in last 6 months? No  LIVING ENVIRONMENT: Lives with: lives with their family   OCCUPATION: PA  PLOF: Independent  PATIENT GOALS: to have no pain with sex  PERTINENT HISTORY:   Malignant neoplasm of upper-outer quadrant of right  breast in female, estrogen receptor positive (HCC)  Surgery: s/p Posterior repair, sacrospinous ligament fixation, perineorrhaphy, anal sphincteroplasty on 01/26/22 S/p EUA and revision perineoplasty on 02/21/22.  Sexual abuse: No  BOWEL MOVEMENT: Pain with bowel movement:  No Type of bowel movement: normal for pt Fully empty rectum: Yes:   Leakage: No Pads: No Fiber supplement: No  URINATION: Pain with urination: No Fully empty bladder: Yes:   Stream: Strong Urgency: No Frequency: not quicker than every 2 hours Leakage:  none Pads: No  INTERCOURSE: Pain with intercourse: Initial Penetration and During Penetration Ability to have vaginal penetration:  Yes: but painful Climax: not really due to pain Marinoff Scale: 2/3  PREGNANCY: Vaginal deliveries 2 Tearing Yes: 3rd deg tear posterior with first and anterior second  C-section deliveries 0 Currently pregnant No  PROLAPSE: None   OBJECTIVE:   DIAGNOSTIC FINDINGS:    COGNITION: Overall cognitive status: Within functional limits for tasks assessed     SENSATION: Light touch: Appears intact Proprioception: Appears intact  MUSCLE LENGTH: Hamstrings and adductors limited by 25%    POSTURE: rounded shoulders, forward head, and flexed trunk   LUMBARAROM/PROM:  A/PROM A/PROM  eval  Flexion Limited by 25%  Extension WFL  Right lateral flexion WFL  Left lateral flexion WFL  Right rotation WFL  Left rotation WFL   (Blank rows = not tested)  LOWER EXTREMITY ROM:  WFL  LOWER EXTREMITY MMT:  Hip abduction 3+/5 bil, all other 4/5, knees 5/5 PALPATION:   General  no TTP externally                 External Perineal Exam mild TTP at posterior vaginal opening externally 10/08/22 - TTP at bil labia minora however reports she needs to add her cream externally to help with tissue mobility and moisture.                              Internal Pelvic Floor bil bulbocavernosus, at vaginal opening at clock face 8-4  locations, no TTP at deep layers except near coccyx. perineal scar tissue restriction/tenderness,  10/08/22 - Lt bulbocavernosus, tension most felt at 4-5 on clock face of vaginal opening, also TTP throughout Lt pubococcygeus possible puborectalis with trigger points noted. Improving perineal scar mobility with mild restrictions now.   Patient confirms identification and approves PT to assess internal pelvic floor and treatment Yes  PELVIC MMT:   MMT eval 10/08/22  11/26/22   Vaginal 3/5, 8s, 10 reps 3/5, 10s, 10 reps - however focus of training has been improved pain management with decreased tension 4/5 5reps 8s  Internal Anal Sphincter     External Anal Sphincter     Puborectalis     Diastasis Recti     (Blank rows = not tested)        TONE: Pelvic floor atrophy   PROLAPSE: Not seen in hooklying  11/12/22 - anterior wall laxity with cough in hooklying and standing.   TODAY'S TREATMENT:  DATE: 12/17/22  manual work at posterior vaginal opening with gentle stretching for decreased pain with penetration.  Does still have soreness in this small localized area of bulbocavernosus region but greatly improved mobility after trigger point injection yesterday. Very minimal tension noted, did well with manual today.   3X10 pelvic floor contractions completed in hooklying with good technique and no bulge noted x5 pelvic floor contractions with isometrics 8s each Updated HEP for gentle hip strengthening and pelvic floor engagement, reviewed with pt and given print outs. Pt denied questions    PATIENT EDUCATION:  Education details: [id number] Person educated: Patient Education method: Explanation, Demonstration, Tactile cues, Verbal cues, and Handouts Education comprehension: verbalized understanding and returned demonstration  HOME EXERCISE PROGRAM: [id number]     ASSESSMENT:  CLINICAL IMPRESSION: Patient presents for treatment today. Pt session focused on manual work at vaginal opening for decreased pain and improved tissue mobility for tolerance to medical exams and intercourse. Pt tolerated manual work well and showing greatly improved mobility at today's session status post trigger point injections. Pt reports she does still have a little soreness but not bad. Pt reports with exam yesterday was told by provider she had grade 2 anterior prolapse.   Pt would benefit from additional PT to continue to progress toward goals with improved pelvic floor mobility and decreased pain with penetration, begin strengthening pelvic floor for activity to decreased symptoms of prolapse as pt tolerates.    OBJECTIVE IMPAIRMENTS: decreased coordination, decreased endurance, decreased mobility, decreased strength, increased fascial restrictions, increased muscle spasms, impaired flexibility, improper body mechanics, postural dysfunction, and pain.   ACTIVITY LIMITATIONS: sitting and intercourse, medical assessments  with internal needs  PARTICIPATION LIMITATIONS: interpersonal relationship and community activity  PERSONAL FACTORS: 1 comorbidity: medical history  are also affecting patient's functional outcome.   REHAB POTENTIAL: Good  CLINICAL DECISION MAKING: Stable/uncomplicated  EVALUATION COMPLEXITY: Low   GOALS: Goals reviewed with patient? Yes  SHORT TERM GOALS: Target date: 5//10/24  Pt to be I with advanced HEP.  Baseline: Goal status: MET  2.  Pt will report no more than 5/10 pain due to improvements in posture, strength, and muscle length  Baseline:  Goal status: MET   LONG TERM GOALS: Target date: 10/23/22  Pt to be I with advanced HEP.  Baseline:  Goal status: on going 2.  Pt will report no more than 2/10 pain due to improvements in posture, strength, and muscle length  Baseline:  Goal status: on going  3.  Pt to report improved  tolerance to vaginal penetration for improved ability to be able to climax at least 75% of the instance of intercourse.  Baseline:  Goal status: on going  4.  Pt to be I with manual self stretching of perineal body/vaginal opening to improve tissue mobility and decrease pain at home.  Baseline:  Goal status: MET  5.  Pt to demonstrate at least 4/5 pelvic floor strength with ability to fully relax post contraction for improved pelvic stability and decreased strain at pelvic floor/ decrease leakage.  Baseline:  Goal status: on going  6.  Pt to demonstrate at least 5/5 bil hip strength with good coordination of pelvic floor and breathing  for improved pelvic stability and functional squats without leakage.   Baseline:  Goal status: MET   PLAN:  PT FREQUENCY: 1x/week  PT DURATION:  8 sessions  PLANNED INTERVENTIONS: Therapeutic exercises, Therapeutic activity, Neuromuscular re-education, Balance training, Gait training, Patient/Family education, Self Care, Aquatic Therapy, Dry Needling, Spinal  mobilization, Cryotherapy, Moist heat, scar mobilization, Taping, Biofeedback, and Manual therapy  PLAN FOR NEXT SESSION: manual work at perineal mobility, scar mobility, core and hip strengthening, coordination of pelvic floor and breathing mechanics   Otelia Sergeant, PT, DPT 09/05/243:23 PM

## 2022-12-25 ENCOUNTER — Ambulatory Visit: Payer: BC Managed Care – PPO | Admitting: Obstetrics and Gynecology

## 2022-12-25 ENCOUNTER — Encounter: Payer: Self-pay | Admitting: Obstetrics and Gynecology

## 2022-12-25 VITALS — BP 123/84 | HR 74

## 2022-12-25 DIAGNOSIS — R102 Pelvic and perineal pain: Secondary | ICD-10-CM | POA: Diagnosis not present

## 2022-12-25 DIAGNOSIS — N9089 Other specified noninflammatory disorders of vulva and perineum: Secondary | ICD-10-CM

## 2022-12-25 NOTE — Progress Notes (Signed)
[name] is a 52 y.o. female who presents for trigger point injection at perineum.   Vitals:   12/25/22 0834  BP: 123/84  Pulse: 74     Indication(s): perineal scar pain  Informed Consent:  The alternatives, risks and benefits of the procedure were explained to the patient. Risks including, but not limited to discomfort, pain, bleeding, infection, injury to nearby structures, inability to perform the procedure, failure of the procedure were discussed.  All questions were answered and the patient elected to proceed.  Procedure:   The patient was positioned in dorsal lithotomy position.  The vaginal tissues were prepped with Hibiclens solution.  An injection of 5cc of a mixture of 90% anesthetic (0.25% Bupivacaine) and 10% 40mg /ml Triamcinolone acetomide (Kenalog) was performed in single at the introitus.  Pressure was held over bleeding areas until good hemostasis was achieved.   The patient tolerated the procedure well with no apparent complications. Return 1 week  Marguerita Beards, MD

## 2022-12-28 ENCOUNTER — Ambulatory Visit (HOSPITAL_BASED_OUTPATIENT_CLINIC_OR_DEPARTMENT_OTHER)
Admission: RE | Admit: 2022-12-28 | Discharge: 2022-12-28 | Disposition: A | Discharge status: Home / Self Care | Payer: BC Managed Care – PPO | Source: Ambulatory Visit | Attending: Adult Health | Admitting: Adult Health

## 2022-12-28 ENCOUNTER — Other Ambulatory Visit: Payer: Self-pay | Admitting: Hematology and Oncology

## 2022-12-28 ENCOUNTER — Ambulatory Visit
Admission: RE | Admit: 2022-12-28 | Discharge: 2022-12-28 | Disposition: A | Discharge status: Home / Self Care | Payer: BC Managed Care – PPO | Source: Ambulatory Visit | Attending: Hematology and Oncology | Admitting: Hematology and Oncology

## 2022-12-28 DIAGNOSIS — Z170 Estrogen receptor positive status [ER+]: Secondary | ICD-10-CM

## 2022-12-28 DIAGNOSIS — R928 Other abnormal and inconclusive findings on diagnostic imaging of breast: Secondary | ICD-10-CM

## 2022-12-28 DIAGNOSIS — Z780 Asymptomatic menopausal state: Secondary | ICD-10-CM | POA: Diagnosis present

## 2022-12-28 DIAGNOSIS — C50411 Malignant neoplasm of upper-outer quadrant of right female breast: Secondary | ICD-10-CM | POA: Insufficient documentation

## 2022-12-28 MED ORDER — GADOPICLENOL 0.5 MMOL/ML IV SOLN
9.0000 mL | Freq: Once | INTRAVENOUS | Status: AC | PRN
  Administered 2022-12-28: 9 mL via INTRAVENOUS

## 2022-12-30 NOTE — Assessment & Plan Note (Addendum)
07/08/2020: Screening mammogram showed right breast calcifications. Diagnostic mammogram showed right breast calcifications spanning 4.1cm and no abnormal right axillary lymph nodes. 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 Genetics done in 2019: Negative   Treatment plan: 1. Neoadjuvant chemotherapy with TCH Perjeta 6 cycles completed 11/15/2020 followed by Herceptin Perjeta maintenance for 1 year 2. 12/11/2020:Right breast lumpectomy Dwain Sarna): Scattered foci of high-grade DCIS with calcifications, no invasive cancer, pathologic complete response, margins negative, 0/3 lymph nodes negative, previously ER 80%, PR 40%, HER2 positive, Ki-67 25% 3. Followed by adjuvant radiation therapy  started 02/04/2021 4.  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 URCC nausea study --------------------------------------------------------------------------------------------------------------------------------------- Profound intermittent rash with skin exfoliation on the face and puffiness of the face started March 2023 switched to exemestane and the rash came back.  Discontinued exemestane August 2023: The symptoms have resolved.   Recommendation:  Tamoxifen started 02/25/2022 initially 10 mg increased to 20 mg daily.   Breast Cancer Surveillance: 1. Breast MRI: 12/28/2022 benign, breast density category C 2. DEXA scan. 12/28/2022: T score -2.4: Recommended bisphosphonate therapy 3. Mammograms: 07/16/2022: Indeterminate 0.7 cm group of calcifications right breast stereotactic biopsy: Benign 4.  Signatera for MRD monitoring: Negative   Tamoxifen toxicities: Hot flashes: wake her up But she is tolerating it fairly well.

## 2022-12-31 ENCOUNTER — Encounter: Payer: Self-pay | Admitting: Radiology

## 2022-12-31 ENCOUNTER — Inpatient Hospital Stay: Payer: BC Managed Care – PPO | Attending: Hematology and Oncology | Admitting: Hematology and Oncology

## 2022-12-31 ENCOUNTER — Encounter: Payer: BC Managed Care – PPO | Admitting: Physical Therapy

## 2022-12-31 VITALS — BP 124/75 | HR 68 | Temp 97.2°F | Resp 18 | Ht 66.25 in | Wt 178.2 lb

## 2022-12-31 DIAGNOSIS — M8580 Other specified disorders of bone density and structure, unspecified site: Secondary | ICD-10-CM | POA: Insufficient documentation

## 2022-12-31 DIAGNOSIS — M8588 Other specified disorders of bone density and structure, other site: Secondary | ICD-10-CM | POA: Diagnosis not present

## 2022-12-31 DIAGNOSIS — C50411 Malignant neoplasm of upper-outer quadrant of right female breast: Secondary | ICD-10-CM | POA: Insufficient documentation

## 2022-12-31 DIAGNOSIS — Z170 Estrogen receptor positive status [ER+]: Secondary | ICD-10-CM | POA: Diagnosis not present

## 2022-12-31 MED ORDER — ANASTROZOLE 1 MG PO TABS: 1.0000 mg | ORAL_TABLET | Freq: Every day | ORAL | 3 refills | Status: DC

## 2022-12-31 NOTE — Research (Addendum)
[id number]: COGNITIVE TRAINING FOR CANCER RELATED COGNITIVE IMPAIRMENT IN BREAST CANCER SURVIVORS: A MULTI-CENTER RANDOMIZED DOUBLE- BLINDED CONTROLLED TRIAL    12/31/2022  ELIGIBILITY:  This Coordinator has reviewed this patient's inclusion and exclusion criteria and confirmed Sandy Goodwin is eligible for study participation.  Patient will continue with enrollment. Patient has had cognitive issues post-breast cancer treatment.  Patient completed treatment in April 2023 --greater than 6 months, but less than 5 yrs.  Patient confirmed: No prior history of current other cancer; no previous exposure to chemotherapy treatment for another cancer or medical condition (per patient, she has rheumatoid arthritis, but takes no medication for it); patient has not had previous CNS radiation, intrathecal therapy or CNS involved surgery; patient does not have a history of stroke, traumatic brain injury, brain surgery, Alzheimer's, or other dementia; patient does not have active substance abuse and/or in treatment for substance abuse, or history of bipolar disorder, psychosis, schizophrenia, ADHD or learning disability.   All screening assessments were complete: Screening Assessment 1: score was 5 (less than 12 proceed to screening assessment 2) Screening Assessment 2: Score was 6 (if greater than 3, proceed to screening assessment 3) Screening Assessment 3: Score was 0 (if less than 3, they are eligible)   Eligibility confirmed by treating investigator, who also agrees that patient should proceed with enrollment.  [name] confirmed patient does not have metastatic disease.   CONSENT: Patient Sandy Goodwin was identified by [name] as a potential candidate for the above listed study.  This Clinical Research Coordinator met with Sandy Goodwin, Sandy Goodwin, on 12/31/22 in a manner and location that ensures patient privacy to discuss participation in the above listed research study.  Patient is Unaccompanied.  A copy  of the informed consent document and separate HIPAA Authorization was provided to the patient.  Patient reads, speaks, and understands [place].    Patient was provided with the business card of this Coordinator and encouraged to contact the research team with any questions.  Patient was provided the option of taking informed consent documents home to review and was encouraged to review at their convenience with their support network, including other care providers. Patient is comfortable with making a decision regarding study participation today.  As outlined in the informed consent form, this Coordinator and Delayne DEOSHA MAMMEN discussed the purpose of the research study, the investigational nature of the study, study procedures and requirements for study participation, potential risks and benefits of study participation, as well as alternatives to participation. This study is blinded. The patient understands participation is voluntary and they may withdraw from study participation at any time.  Each study arm was reviewed, and randomization discussed.  This study does not involve an investigational drug or device. This study does not involve a placebo. Patient understands enrollment is pending full eligibility review.   Confidentiality and how the patient's information will be used as part of study participation were discussed.  Patient was informed there is reimbursement provided for their time and effort spent on trial participation.  The patient is encouraged to discuss research study participation with their insurance provider to determine what costs they may incur as part of study participation, including research related injury.    All questions were answered to patient's satisfaction.  The informed consent and separate HIPAA Authorization was reviewed page by page.  The patient's mental and emotional status is appropriate to provide informed consent, and the patient verbalizes an understanding of study  participation.  Patient has  agreed to participate in the above listed research study and has voluntarily signed the informed consent dated 03/12/2022 version and separate HIPAA Authorization, version dated until 04/04/2023  on 12/31/22 at 11:11AM.  The patient was provided with a copy of the signed informed consent form and separate HIPAA Authorization for their reference.  No study specific procedures were obtained prior to the signing of the informed consent document.  Approximately 30 minutes were spent with the patient reviewing the informed consent documents.  Patient was not requested to complete a Release of Information form.   After completion of consent, patient's contact information was obtained and confirmed preferred method for questionnaires is e-mail. Patient verified best phone number for telephone assessments. Patient confirmed it is okay to leave voicemail about study.   Patient was thanked for her time and support of the above mentioned study. This coordinator will contact patient to inform her of updates.   Merri Brunette, RT(R)(T) Clinical Research Coordinator

## 2022-12-31 NOTE — Research (Signed)
[id number]: COGNITIVE TRAINING FOR CANCER RELATED COGNITIVE IMPAIRMENT IN BREAST CANCER SURVIVORS: A MULTI-CENTER RANDOMIZED DOUBLE- BLINDED CONTROLLED TRIAL   This Nurse has reviewed this patient's inclusion and exclusion criteria as a second review and confirms Lizvet H Singhal is eligible for study participation.  Patient may continue with enrollment.  Margret Chance Ceaser Ebeling, RN, BSN, [facility]  Her  Hers Clinical Research Nurse [facility] Direct Dial [phone]  Pager [phone] 12/31/2022 12:03 PM

## 2022-12-31 NOTE — Progress Notes (Signed)
Patient Care Team: Richardean Chimera, MD as PCP - General (Family Medicine) Emelia Loron, MD as Consulting Physician (General Surgery) Serena Croissant, MD as Consulting Physician (Hematology and Oncology) Dorothy Puffer, MD as Consulting Physician (Radiation Oncology)  DIAGNOSIS:  Encounter Diagnoses  Name Primary?   Malignant neoplasm of upper-outer quadrant of right breast in female, estrogen receptor positive (HCC) Yes   Osteopenia of lumbar spine     SUMMARY OF ONCOLOGIC HISTORY: Oncology History  Malignant neoplasm of upper-outer quadrant of right breast in female, estrogen receptor positive (HCC)  03/23/2018 Genetic Testing   [facility] Genetic Testing Results: Negative, with a variant of uncertain significance in POLE.  Genes Analyzed: APC, ATM, AXIN2, BARD1, BMPR1A, BRCA1, BRCA2, BRIP1, CDH1, CDK4, CDKN2A, CHEK2, EPCAM (large rearrangement only), HOXB13 (sequencing only), GALNT12, MLH1, MSH2, MSH3, MSH6, MUTYH, NBN, NTHL1, PALB2, PMS2, PTEN, RAD51C, RAD51D, RNF43, RPS20, SMAD4, STK11, TP52. Sequencing was performed for select regions of POLE and POLD1, and large rearrangement analysis was performed for select regions of GERM1.   07/08/2020 Initial Diagnosis   Screening mammogram showed right breast calcifications. Diagnostic mammogram showed right breast calcifications spanning 4.1cm and no abnormal right axillary lymph nodes. 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   07/24/2020 - 11/15/2020 Neo-Adjuvant Chemotherapy   Taxotere, Carbo, Herceptin, Perjeta given every three weeks x 6   12/05/2020 - 07/24/2021 Chemotherapy   Maintenance Herceptin/Perjeta every three weeks for one year       12/11/2020 Surgery   Right breast lumpectomy Dwain Sarna): Scattered foci of high-grade DCIS with calcifications, no invasive cancer, pathologic complete response, margins negative, 0/3 lymph nodes negative, previously  ER 80%, PR 40%, HER2 positive, Ki-67 25%   12/11/2020 Cancer Staging   Staging form: Breast, AJCC 8th Edition - Pathologic stage from 12/11/2020: No Stage Recommended (ypT0, pN0, cM0) - Signed by Loa Socks, NP on 03/19/2021 Stage prefix: Post-therapy   12/20/2020 Surgery   Reconstruction with Dr. Leta Baptist   02/04/2021 - 03/25/2021 Radiation Therapy   Adjuvant radiation   06/2021 -  Anti-estrogen oral therapy   Anastrozole daily     CHIEF COMPLIANT: Follow-up on anastrozole therapy  Discussed the use of AI scribe software for clinical note transcription with the patient, who gave verbal consent to proceed.  History of Present Illness   The patient, with a history of breast cancer, presents for a routine follow-up. She reports intolerance to tamoxifen, which was manifested as severe hot flashes, headaches, and worsening muscle cramps in the legs. As a result, she switched back to anastrozole during the summer and has been tolerating it well without any recurrence of the rash that she previously experienced with this medication. She has been using gabapentin at bedtime to help manage the hot flashes, which has been somewhat helpful.  The patient also reports cognitive dysfunction, which she describes as difficulty with memory and focus.  In addition, the patient has been dealing with osteoporosis. Her recent bone density scan showed a worsening of her condition, with her score going from -2.2 to -2.4 in the back.  The patient also mentions a history of lymphedema in the breast, which comes and goes. She has been undergoing regular mammograms and MRIs for breast cancer surveillance. Her most recent MRI was reportedly clear.         ALLERGIES:  is allergic to codeine, hyaluronic acid [sodium hyaluronate (non-avian)], hydrocodone, other, latex, and wound dressing adhesive.  MEDICATIONS:  Current Outpatient Medications  Medication Sig Dispense Refill   acetaminophen  (TYLENOL) 500 MG tablet Take 1 tablet (500 mg total) by mouth every 6 (six) hours as needed (pain). (Patient taking differently: Take 500 mg by mouth every 6 (six) hours as needed (pain). For after surgery) 30 tablet 0   baclofen (LIORESAL) 20 MG tablet Take 10 mg by mouth daily.     BYSTOLIC 5 MG tablet Take 1 tablet by mouth at bedtime.  1   cetirizine (ZYRTEC) 10 MG tablet Take 10 mg by mouth at bedtime.     clobetasol ointment (TEMOVATE) 0.05 % Apply 1 Application topically 2 (two) times daily as needed.     estradiol (ESTRACE) 0.1 MG/GM vaginal cream Place 0.5 g vaginally 2 (two) times a week. Place 0.5g nightly for two weeks then twice a week after 30 g 11   gabapentin (NEURONTIN) 100 MG capsule Take 1 capsule (100 mg total) by mouth 3 (three) times daily. 90 capsule 1   levothyroxine (SYNTHROID, LEVOTHROID) 50 MCG tablet Take 50 mcg by mouth at bedtime.     lidocaine (XYLOCAINE) 5 % ointment APPLY TOPICALLY AS NEEDED 35 g 1   metroNIDAZOLE (METROCREAM) 0.75 % cream Apply 1 application  topically 2 (two) times daily as needed.     pantoprazole (PROTONIX) 40 MG tablet Take 40 mg by mouth at bedtime.     Polyethylene Glycol 3350 (MIRALAX PO) Take by mouth daily as needed.     triamcinolone (NASACORT) 55 MCG/ACT AERO nasal inhaler Place 2 sprays into the nose daily as needed. Pt uses during allergy season     WEGOVY 2.4 MG/0.75ML SOAJ Inject 2.4 mg into the skin once a week. Friday's  02/18/22 Per pt, she has been holding Wegovy since before her last surgery on 01/26/22.     anastrozole (ARIMIDEX) 1 MG tablet Take 1 tablet (1 mg total) by mouth daily. 90 tablet 3   Current Facility-Administered Medications  Medication Dose Route Frequency Provider Last Rate Last Admin   bupivacaine (MARCAINE) 0.25 % (with pres) injection 9 mL  9 mL Infiltration Once        triamcinolone acetonide (KENALOG-40) injection 40 mg  40 mg Intramuscular Once         PHYSICAL EXAMINATION: ECOG PERFORMANCE STATUS: 1  - Symptomatic but completely ambulatory  Vitals:   12/31/22 1002  BP: 124/75  Pulse: 68  Resp: 18  Temp: (!) 97.2 F (36.2 C)  SpO2: 98%   Filed Weights   12/31/22 1002  Weight: 178 lb 3.2 oz (80.8 kg)    Physical Exam          (exam performed in the presence of a chaperone)  LABORATORY DATA:  I have reviewed the data as listed    Latest Ref Rng & Units 07/24/2021    9:09 AM 07/03/2021    9:01 AM 06/12/2021    9:01 AM  CMP  Glucose 70 - 99 mg/dL 621  308  657   BUN 6 - 20 mg/dL 24  17  19    Creatinine 0.44 - 1.00 mg/dL 8.46  9.62  9.52   Sodium 135 - 145 mmol/L 141  140  140   Potassium 3.5 - 5.1 mmol/L 4.4  4.1  4.0   Chloride 98 - 111 mmol/L 109  106  106   CO2 22 - 32 mmol/L 26  29  29    Calcium 8.9 - 10.3 mg/dL 8.5  8.9  9.0   Total Protein  6.5 - 8.1 g/dL 6.6  7.0  7.2   Total Bilirubin 0.3 - 1.2 mg/dL 0.2  0.3  0.4   Alkaline Phos 38 - 126 U/L 107  111  115   AST 15 - 41 U/L 19  17  18    ALT 0 - 44 U/L 16  17  19      Lab Results  Component Value Date   WBC 4.7 02/19/2022   HGB 10.2 (L) 02/19/2022   HCT 33.4 (L) 02/19/2022   MCV 91.0 02/19/2022   PLT 376 02/19/2022   NEUTROABS 4.7 07/24/2021    ASSESSMENT & PLAN:  Malignant neoplasm of upper-outer quadrant of right breast in female, estrogen receptor positive (HCC) 07/08/2020: Screening mammogram showed right breast calcifications. Diagnostic mammogram showed right breast calcifications spanning 4.1cm and no abnormal right axillary lymph nodes. 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 Genetics done in 2019: Negative   Treatment plan: 1. Neoadjuvant chemotherapy with TCH Perjeta 6 cycles completed 11/15/2020 followed by Herceptin Perjeta maintenance for 1 year 2. 12/11/2020:Right breast lumpectomy Dwain Sarna): Scattered foci of high-grade DCIS with calcifications, no invasive cancer, pathologic complete response, margins negative,  0/3 lymph nodes negative, previously ER 80%, PR 40%, HER2 positive, Ki-67 25% 3. Followed by adjuvant radiation therapy  started 02/04/2021 4.  Followed by adjuvant antiestrogen therapy switched back to anastrozole from tamoxifen. 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 URCC nausea study ---------------------------------------------------------------------------------------------------------------------------------------   Breast Cancer Surveillance: 1. Breast MRI: 12/28/2022 benign, breast density category C 2. DEXA scan. 12/28/2022: T score -2.4: Recommended bisphosphonate therapy.  I recommend Zometa once a year starting next week.  Labs were done by her primary care physician on 12/29/2022 which showed normal creatinine 0.73 and normal calcium 9.2.  3. Mammograms: 07/16/2022: Indeterminate 0.7 cm group of calcifications right breast stereotactic biopsy: Benign another diagnostic mammogram planned 4.  Signatera for MRD monitoring: Negative 5.  Breast MRI 12/28/2022: Benign breast density category C  For next year I recommend switching her from MRIs to contrast-enhanced mammograms.    ------------------------------------- Assessment and Plan    Breast Cancer Tolerating Anastrozole without recurrence of rash. Reported adverse effects with Tamoxifen including headaches, hot flashes, and muscle cramps. Gabapentin at bedtime has been helpful for hot flashes. -Continue Anastrozole and Gabapentin. -Refill Anastrozole for 1 year. -Consider participation in cognitive dysfunction study.  Vaginal Dryness Improvement with Estrace cream. -Continue Estrace cream.  Osteoporosis Worsening bone density (T-score -2.4). Discussed options for treatment including Zometa and Prolia. -Initiate Zometa infusion once a year, starting next week.  Weight loss: On Wegovy  Breast Imaging Discussed options for future imaging including contrast-enhanced mammogram and  MRI. Patient has been receiving Signatera with negative results. -Schedule contrast-enhanced mammogram for April 2025. -Consider MRI in 2-3 years if needed. -Follow-up diagnostic mammogram next month as previously planned.          Orders Placed This Encounter  Procedures   MM 2D DIAG BILAT WITH CONTRAST BCG ONLY    Standing Status:   Future    Standing Expiration Date:   12/31/2023    Order Specific Question:   Reason for Exam (SYMPTOM  OR DIAGNOSIS REQUIRED)    Answer:   High risk follow up    Order Specific Question:   If indicated for the ordered procedure, I authorize the administration of contrast media per Radiology protocol    Answer:  Yes    Order Specific Question:   Does the patient have a contrast media/X-ray dye allergy?    Answer:   No    Order Specific Question:   Is the patient pregnant?    Answer:   No    Order Specific Question:   Preferred Imaging Location?    Answer:   [facility]    Order Specific Question:   Release to patient    Answer:   Immediate   The patient has a good understanding of the overall plan. she agrees with it. she will call with any problems that may develop before the next visit here. Total time spent: 30 mins including face to face time and time spent for planning, charting and co-ordination of care   Tamsen Meek, MD 12/31/22

## 2023-01-01 ENCOUNTER — Ambulatory Visit: Payer: BC Managed Care – PPO | Admitting: Obstetrics and Gynecology

## 2023-01-01 ENCOUNTER — Encounter: Payer: Self-pay | Admitting: Family Medicine

## 2023-01-01 ENCOUNTER — Encounter: Payer: Self-pay | Admitting: Obstetrics and Gynecology

## 2023-01-01 VITALS — BP 128/85 | HR 65

## 2023-01-01 DIAGNOSIS — R102 Pelvic and perineal pain: Secondary | ICD-10-CM | POA: Diagnosis not present

## 2023-01-01 DIAGNOSIS — N9089 Other specified noninflammatory disorders of vulva and perineum: Secondary | ICD-10-CM

## 2023-01-01 MED ORDER — TRIAMCINOLONE ACETONIDE 40 MG/ML IJ SUSP
40.0000 mg | Freq: Once | INTRAMUSCULAR | Status: AC
Start: 2023-01-01 — End: 2023-01-01
  Administered 2023-01-01: 40 mg via INTRAMUSCULAR

## 2023-01-01 MED ORDER — BUPIVACAINE HCL 0.25 % IJ SOLN
5.0000 mL | Freq: Once | INTRAMUSCULAR | Status: AC
Start: 2023-01-01 — End: 2023-01-01
  Administered 2023-01-01: 5 mL

## 2023-01-01 NOTE — Progress Notes (Signed)
[name] is a 52 y.o. female who presents for trigger point injection at perineum.   Vitals:   01/01/23 1420  BP: 128/85  Pulse: 65     Indication(s): perineal scar pain  Informed Consent:  The alternatives, risks and benefits of the procedure were explained to the patient. Risks including, but not limited to discomfort, pain, bleeding, infection, injury to nearby structures, inability to perform the procedure, failure of the procedure were discussed.  All questions were answered and the patient elected to proceed.  Procedure:   The patient was positioned in dorsal lithotomy position.  The vaginal tissues were prepped with Hibiclens solution.  An injection of 5cc of a mixture of 90% anesthetic (0.25% Bupivacaine) and 10% 40mg /ml Triamcinolone acetomide (Kenalog) was performed in single at the introitus.  Pressure was held over bleeding areas until good hemostasis was achieved.   The patient tolerated the procedure well with no apparent complications. Return 1 week  Marguerita Beards, MD

## 2023-01-04 ENCOUNTER — Ambulatory Visit: Payer: BC Managed Care – PPO | Admitting: Physical Therapy

## 2023-01-04 DIAGNOSIS — M62838 Other muscle spasm: Secondary | ICD-10-CM

## 2023-01-04 DIAGNOSIS — M6281 Muscle weakness (generalized): Secondary | ICD-10-CM

## 2023-01-04 NOTE — Therapy (Unsigned)
OUTPATIENT PHYSICAL THERAPY FEMALE PELVIC TREATMENT   Patient Name: Sandy Goodwin MRN: [id number] DOB:18-Dec-1970, 52 y.o., female Today's Date: 01/04/2023   END OF SESSION:  PT End of Session - 01/04/23 1536     Visit Number 16    Date for PT Re-Evaluation 01/11/23    Authorization Type [employer]    PT Start Time 1531    PT Stop Time 1610    PT Time Calculation (min) 39 min    Activity Tolerance Patient tolerated treatment well    Behavior During Therapy [facility] for tasks assessed/performed              Past Medical History:  Diagnosis Date   Full incontinence-feces    History of anemia    History of cancer chemotherapy    right breast;   neo-adjuvent 07-24-2020  to 11-15-2020  and 12-05-2020  to 07-24-2021   History of external beam radiation therapy    right reast 02-04-2021  to 03-25-2021   History of gastric ulcer    remote hx -- resolved   Hypothyroidism, postsurgical    followed by pcp;     02/ 2012  s/p  right hemithyroidectomy  (follicular adenoma)   Malignant neoplasm of upper-outer quadrant of right breast in female, estrogen receptor positive (HCC) 06/2020   oncologist--- dr Pamelia Hoit;  DCIS high grade right breast , neo-adjuvant chemo completed 11-15-2020,  s/p right breast lumpectomy w/ node dissection on 12-11-2020, then chemo completed 07-24-2021 and radiation completed 03-25-2021   Polyarthralgia    PONV (postoperative nausea and vomiting)    With first surgery, none since   Prolapse of vaginal vault after hysterectomy    and posterior prolapse   RA (rheumatoid arthritis) (HCC)    rheumatologsit--- dr Dierdre Forth;  no treatment   Tachycardia    Tachycardia   Past Surgical History:  Procedure Laterality Date   ANTERIOR AND POSTERIOR REPAIR WITH SACROSPINOUS FIXATION N/A 01/26/2022   Procedure: POSTERIOR REPAIR WITH SACROSPINOUS FIXATION;  Surgeon: Marguerita Beards, MD;  Location: [facility] [place];  Service: Gynecology;  Laterality: N/A;  total time  requested is 2 hours   APPENDECTOMY  08/2006   exploratory laparatomy w/ appendectomy (ruptured) and repair bowel   BREAST BIOPSY Right 07/30/2022   MM RT BREAST BX W LOC DEV 1ST LESION IMAGE BX SPEC STEREO GUIDE 07/30/2022 [facility]   BREAST LUMPECTOMY WITH RADIOACTIVE SEED AND SENTINEL LYMPH NODE BIOPSY Right 12/11/2020   Procedure: RIGHT BREAST LUMPECTOMY WITH RADIOACTIVE SEED X2 AND RIGHT AXILLARY SENTINEL LYMPH NODE BIOPSY;  Surgeon: Emelia Loron, MD;  Location: [facility];  Service: General;  Laterality: Right;   BREAST RECONSTRUCTION Right 12/20/2020   Procedure: RIGHT ONCOPLASTIC RECONSTRUCTION;  Surgeon: Glenna Fellows, MD;  Location: [facility];  Service: Plastics;  Laterality: Right;   BREAST REDUCTION SURGERY Left 12/20/2020   Procedure: LEFT BREAST REDUCTION;  Surgeon: Glenna Fellows, MD;  Location: [facility];  Service: Plastics;  Laterality: Left;   COLONOSCOPY N/A 06/15/2016   Procedure: COLONOSCOPY;  Surgeon: Malissa Hippo, MD;  [facility];  Service: Endoscopy;  Laterality: N/A;  845   DILATION AND CURETTAGE OF UTERUS  06/03/2011   w/ suction for miscarriage   EXCISION OF BREAST BIOPSY Right 1999   INCISION AND DRAINAGE OF WOUND Right 01/10/2021   Procedure: INCISION AND DRAINAGE OF RIGHT AXILLA;  Surgeon: Glenna Fellows, MD;  Location: [facility];  Service: Plastics;  Laterality: Right;  OVUM / OOCYTE RETRIEVAL     2010  and 2012   PERINEOPLASTY N/A 01/26/2022   Procedure: PERINEORRHAPY;  Surgeon: Marguerita Beards, MD;  Location: [facility];  Service: Gynecology;  Laterality: N/A;   PERINEOPLASTY N/A 02/19/2022   Procedure: PERINEOPLASTY revision;  Surgeon: Marguerita Beards, MD;  Location: [facility];  Service: Gynecology;  Laterality: N/A;   PORTACATH PLACEMENT Left 07/23/2020   Procedure: INSERTION PORT-A-CATH;  Surgeon: Emelia Loron, MD;  Location: [facility];  Service: General;  Laterality: Left;   THYROID LOBECTOMY Right 05/2010   @[facility]   VAGINAL HYSTERECTOMY  03/21/2019   @ [facility] by dr Mora Appl;   w/ Rogelio Seen culdoplasty   Patient Active Problem List   Diagnosis Date Noted   Osteopenia 12/31/2022   Prolapse of posterior vaginal wall 01/26/2022   Uterine prolapse 03/20/2021   Polyarthralgia 03/20/2021   Port-A-Cath in place 02/06/2021   Genetic testing 07/17/2020   Malignant neoplasm of upper-outer quadrant of right breast in female, estrogen receptor positive (HCC) 07/12/2020   Change in bowel habits 06/12/2016   Mixed hyperlipidemia 01/07/2016   Hypothyroidism, unspecified 01/07/2016   Allergic rhinitis 04/02/2015   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   PAIN:  Are you having pain? No - at this time. Hasn't had sex recently   PRECAUTIONS: Other: breast CA - not currently getting treatments   WEIGHT BEARING RESTRICTIONS: No  FALLS:  Has patient fallen in last 6 months? No  LIVING ENVIRONMENT: Lives with: lives with their family   OCCUPATION: PA  PLOF: Independent  PATIENT GOALS: to have no pain with sex  PERTINENT HISTORY:   Malignant neoplasm of upper-outer quadrant of right breast in female, estrogen receptor positive (HCC)  Surgery: s/p Posterior repair, sacrospinous ligament  fixation, perineorrhaphy, anal sphincteroplasty on 01/26/22 S/p EUA and revision perineoplasty on 02/21/22.  Sexual abuse: No  BOWEL MOVEMENT: Pain with bowel movement: No Type of bowel movement: normal for pt Fully empty rectum: Yes:   Leakage: No Pads: No Fiber  supplement: No  URINATION: Pain with urination: No Fully empty bladder: Yes:   Stream: Strong Urgency: No Frequency: not quicker than every 2 hours Leakage:  none Pads: No  INTERCOURSE: Pain with intercourse: Initial Penetration and During Penetration Ability to have vaginal penetration:  Yes: but painful Climax: not really due to pain Marinoff Scale: 2/3  PREGNANCY: Vaginal deliveries 2 Tearing Yes: 3rd deg tear posterior with first and anterior second  C-section deliveries 0 Currently pregnant No  PROLAPSE: None   OBJECTIVE:   DIAGNOSTIC FINDINGS:    COGNITION: Overall cognitive status: Within functional limits for tasks assessed     SENSATION: Light touch: Appears intact Proprioception: Appears intact  MUSCLE LENGTH: Hamstrings and adductors limited by 25%    POSTURE: rounded shoulders, forward head, and flexed trunk   LUMBARAROM/PROM:  A/PROM A/PROM  eval  Flexion Limited by 25%  Extension WFL  Right lateral flexion WFL  Left lateral flexion WFL  Right rotation WFL  Left rotation WFL   (Blank rows = not tested)  LOWER EXTREMITY ROM:  WFL  LOWER EXTREMITY MMT:  Hip abduction 3+/5 bil, all other 4/5, knees 5/5 PALPATION:   General  no TTP externally                 External Perineal Exam mild TTP at posterior vaginal opening externally 10/08/22 - TTP at bil labia minora however reports she needs to add her cream externally to help with tissue mobility and moisture.                              Internal Pelvic Floor bil bulbocavernosus, at vaginal opening at clock face 8-4 locations, no TTP at deep layers except near coccyx. perineal scar tissue restriction/tenderness,   10/08/22 - Lt bulbocavernosus, tension most felt at 4-5 on clock face of vaginal opening, also TTP throughout Lt pubococcygeus possible puborectalis with trigger points noted. Improving perineal scar mobility with mild restrictions now.   Patient confirms identification and approves PT to assess internal pelvic floor and treatment Yes  PELVIC MMT:   MMT eval 10/08/22  11/26/22   Vaginal 3/5, 8s, 10 reps 3/5, 10s, 10 reps - however focus of training has been improved pain management with decreased tension 4/5 5reps 8s  Internal Anal Sphincter     External Anal Sphincter     Puborectalis     Diastasis Recti     (Blank rows = not tested)        TONE: Pelvic floor atrophy   PROLAPSE: Not seen in hooklying  11/12/22 - anterior wall laxity with cough in hooklying and standing.   TODAY'S TREATMENT:                                                                                                                              DATE: 12/17/22  manual work at posterior vaginal opening with gentle stretching for  decreased pain with penetration.  Does still have soreness in this small localized area of bulbocavernosus region but greatly improved mobility after trigger point injection yesterday. Very minimal tension noted, did well with manual today.   3X10 pelvic floor contractions completed in hooklying with good technique and no bulge noted x5 pelvic floor contractions with isometrics 8s each Updated HEP for gentle hip strengthening and pelvic floor engagement, reviewed with pt and given print outs. Pt denied questions   01/05/23:  manual work at posterior vaginal opening with gentle stretching in downward direction, perineal body mobility completed in anterior/posterior directions as well for decreased pain with penetration.  Continued improvement with this noted, pt reports no longer using lidocaine lubricant prior to sessions, did not need to use wand prior to intercourse last time either.  3X10  pelvic floor contractions completed in hooklying with good technique and no bulge noted 2x10 pelvic floor contractions with isometrics 8s each 2x10 quick flicks  PATIENT EDUCATION:  Education details: [id number] Person educated: Patient Education method: Explanation, Demonstration, Tactile cues, Verbal cues, and Handouts Education comprehension: verbalized understanding and returned demonstration  HOME EXERCISE PROGRAM: [id number]    ASSESSMENT:  CLINICAL IMPRESSION: Patient presents for treatment today. Pt session focused on manual work at vaginal opening for decreased pain and improved tissue mobility for tolerance to medical exams and intercourse. Pt tolerated manual work well and continues to show improved mobility at today's session status post trigger point injections. Pt also demonstrating improved strength however does fatigue quicker with increased reps but starts at 4/5 and decreased to 3/5 strength with fatigue. Pt would benefit from additional PT to continue to progress toward goals with improved pelvic floor mobility and decreased pain with penetration, begin strengthening pelvic floor for activity to decreased symptoms of prolapse as pt tolerates.    OBJECTIVE IMPAIRMENTS: decreased coordination, decreased endurance, decreased mobility, decreased strength, increased fascial restrictions, increased muscle spasms, impaired flexibility, improper body mechanics, postural dysfunction, and pain.   ACTIVITY LIMITATIONS: sitting and intercourse, medical assessments  with internal needs  PARTICIPATION LIMITATIONS: interpersonal relationship and community activity  PERSONAL FACTORS: 1 comorbidity: medical history  are also affecting patient's functional outcome.   REHAB POTENTIAL: Good  CLINICAL DECISION MAKING: Stable/uncomplicated  EVALUATION COMPLEXITY: Low   GOALS: Goals reviewed with patient? Yes  SHORT TERM GOALS: Target date: 5//10/24  Pt to be I with advanced HEP.   Baseline: Goal status: MET  2.  Pt will report no more than 5/10 pain due to improvements in posture, strength, and muscle length  Baseline:  Goal status: MET   LONG TERM GOALS: Target date: 10/23/22  Pt to be I with advanced HEP.  Baseline:  Goal status: MET 2.  Pt will report no more than 2/10 pain due to improvements in posture, strength, and muscle length  Baseline:  Goal status: on going  3.  Pt to report improved tolerance to vaginal penetration for improved ability to be able to climax at least 75% of the instance of intercourse.  Baseline:  Goal status: on going  4.  Pt to be I with manual self stretching of perineal body/vaginal opening to improve tissue mobility and decrease pain at home.  Baseline:  Goal status: MET  5.  Pt to demonstrate at least 4/5 pelvic floor strength with ability to fully relax post contraction for improved pelvic stability and decreased strain at pelvic floor/ decrease leakage.  Baseline:  Goal status: MET  6.  Pt to demonstrate at least 5/5 bil  hip strength with good coordination of pelvic floor and breathing  for improved pelvic stability and functional squats without leakage.   Baseline:  Goal status: MET   PLAN:  PT FREQUENCY: 1x/week  PT DURATION:  8 sessions  PLANNED INTERVENTIONS: Therapeutic exercises, Therapeutic activity, Neuromuscular re-education, Balance training, Gait training, Patient/Family education, Self Care, Aquatic Therapy, Dry Needling, Spinal mobilization, Cryotherapy, Moist heat, scar mobilization, Taping, Biofeedback, and Manual therapy  PLAN FOR NEXT SESSION: manual work at perineal mobility, scar mobility, core and hip strengthening, coordination of pelvic floor and breathing mechanics   Otelia Sergeant, PT, DPT 09/23/243:36 PM

## 2023-01-05 ENCOUNTER — Telehealth: Payer: Self-pay | Admitting: Hematology and Oncology

## 2023-01-05 NOTE — Telephone Encounter (Signed)
Patient is aware of scheduled appointment times/dates

## 2023-01-07 ENCOUNTER — Inpatient Hospital Stay: Payer: BC Managed Care – PPO

## 2023-01-07 DIAGNOSIS — C50411 Malignant neoplasm of upper-outer quadrant of right female breast: Secondary | ICD-10-CM | POA: Diagnosis not present

## 2023-01-07 DIAGNOSIS — M8588 Other specified disorders of bone density and structure, other site: Secondary | ICD-10-CM

## 2023-01-07 MED ORDER — ZOLEDRONIC ACID 4 MG/100ML IV SOLN
4.0000 mg | Freq: Once | INTRAVENOUS | Status: AC
  Administered 2023-01-07: 4 mg via INTRAVENOUS
  Filled 2023-01-07: qty 100

## 2023-01-07 MED ORDER — SODIUM CHLORIDE 0.9 % IV SOLN: Freq: Once | INTRAVENOUS | Status: AC

## 2023-01-07 NOTE — Patient Instructions (Signed)

## 2023-01-08 ENCOUNTER — Encounter: Payer: Self-pay | Admitting: Obstetrics and Gynecology

## 2023-01-08 ENCOUNTER — Ambulatory Visit: Payer: BC Managed Care – PPO | Admitting: Obstetrics and Gynecology

## 2023-01-08 VITALS — BP 116/81 | HR 70

## 2023-01-08 DIAGNOSIS — L905 Scar conditions and fibrosis of skin: Secondary | ICD-10-CM

## 2023-01-08 DIAGNOSIS — N9089 Other specified noninflammatory disorders of vulva and perineum: Secondary | ICD-10-CM

## 2023-01-08 NOTE — Progress Notes (Signed)
[name] is a 52 y.o. female who presents for trigger point injection at perineum. Has been feeling a lot better with the injections. She does not have pain and the PT has been improving.   Vitals:   01/08/23 0842  BP: 116/81  Pulse: 70     Indication(s): perineal scar pain  Informed Consent:  The alternatives, risks and benefits of the procedure were explained to the patient. Risks including, but not limited to discomfort, pain, bleeding, infection, injury to nearby structures, inability to perform the procedure, failure of the procedure were discussed.  All questions were answered and the patient elected to proceed.  Procedure:   The patient was positioned in dorsal lithotomy position.  The vaginal tissues were prepped with Hibiclens solution.  An injection of 5cc of a mixture of 90% anesthetic (0.25% Bupivacaine) and 10% 40mg /ml Triamcinolone acetomide (Kenalog) was performed in single at the introitus.  Pressure was held over bleeding areas until good hemostasis was achieved.   The patient tolerated the procedure well with no apparent complications. Return 1 week  Marguerita Beards, MD

## 2023-01-11 ENCOUNTER — Ambulatory Visit: Payer: BC Managed Care – PPO | Admitting: Physical Therapy

## 2023-01-11 DIAGNOSIS — M6281 Muscle weakness (generalized): Secondary | ICD-10-CM

## 2023-01-11 DIAGNOSIS — R293 Abnormal posture: Secondary | ICD-10-CM

## 2023-01-11 DIAGNOSIS — R279 Unspecified lack of coordination: Secondary | ICD-10-CM

## 2023-01-11 DIAGNOSIS — M62838 Other muscle spasm: Secondary | ICD-10-CM

## 2023-01-11 NOTE — Therapy (Signed)
OUTPATIENT PHYSICAL THERAPY FEMALE PELVIC TREATMENT   Patient Name: Sandy Goodwin MRN: [id number] DOB:[birth year], 52 y.o., female Today's Date: 01/11/2023   END OF SESSION:  PT End of Session - 01/11/23 1531     Visit Number 17    Date for PT Re-Evaluation 05/13/23    Authorization Type [employer]    PT Start Time 1530    PT Stop Time 1610    PT Time Calculation (min) 40 min    Activity Tolerance Patient tolerated treatment well    Behavior During Therapy [facility] for tasks assessed/performed              Past Medical History:  Diagnosis Date   Full incontinence-feces    History of anemia    History of cancer chemotherapy    right breast;   neo-adjuvent 07-24-2020  to 11-15-2020  and 12-05-2020  to 07-24-2021   History of external beam radiation therapy    right reast 02-04-2021  to 03-25-2021   History of gastric ulcer    remote hx -- resolved   Hypothyroidism, postsurgical    followed by pcp;     02/ 2012  s/p  right hemithyroidectomy  (follicular adenoma)   Malignant neoplasm of upper-outer quadrant of right breast in female, estrogen receptor positive (HCC) 06/2020   oncologist--- dr Pamelia Hoit;  DCIS high grade right breast , neo-adjuvant chemo completed 11-15-2020,  s/p right breast lumpectomy w/ node dissection on 12-11-2020, then chemo completed 07-24-2021 and radiation completed 03-25-2021   Polyarthralgia    PONV (postoperative nausea and vomiting)    With first surgery, none since   Prolapse of vaginal vault after hysterectomy    and posterior prolapse   RA (rheumatoid arthritis) (HCC)    rheumatologsit--- dr Dierdre Forth;  no treatment   Tachycardia    Tachycardia   Past Surgical History:  Procedure Laterality Date   ANTERIOR AND POSTERIOR REPAIR WITH SACROSPINOUS FIXATION N/A 01/26/2022   Procedure: POSTERIOR REPAIR WITH SACROSPINOUS FIXATION;  Surgeon: Marguerita Beards, MD;  Location: [facility] ;  Service: Gynecology;  Laterality: N/A;  total time  requested is 2 hours   APPENDECTOMY  08/2006   exploratory laparatomy w/ appendectomy (ruptured) and repair bowel   BREAST BIOPSY Right 07/30/2022   MM RT BREAST BX W LOC DEV 1ST LESION IMAGE BX SPEC STEREO GUIDE 07/30/2022 [facility]   BREAST LUMPECTOMY WITH RADIOACTIVE SEED AND SENTINEL LYMPH NODE BIOPSY Right 12/11/2020   Procedure: RIGHT BREAST LUMPECTOMY WITH RADIOACTIVE SEED X2 AND RIGHT AXILLARY SENTINEL LYMPH NODE BIOPSY;  Surgeon: Emelia Loron, MD;  Location: [facility];  Service: General;  Laterality: Right;   BREAST RECONSTRUCTION Right 12/20/2020   Procedure: RIGHT ONCOPLASTIC RECONSTRUCTION;  Surgeon: Glenna Fellows, MD;  Location: [facility];  Service: Plastics;  Laterality: Right;   BREAST REDUCTION SURGERY Left 12/20/2020   Procedure: LEFT BREAST REDUCTION;  Surgeon: Glenna Fellows, MD;  Location: [facility];  Service: Plastics;  Laterality: Left;   COLONOSCOPY N/A 06/15/2016   Procedure: COLONOSCOPY;  Surgeon: Malissa Hippo, MD;  [facility];  Service: Endoscopy;  Laterality: N/A;  845   DILATION AND CURETTAGE OF UTERUS  06/03/2011   w/ suction for miscarriage   EXCISION OF BREAST BIOPSY Right 1999   INCISION AND DRAINAGE OF WOUND Right 01/10/2021   Procedure: INCISION AND DRAINAGE OF RIGHT AXILLA;  Surgeon: Glenna Fellows, MD;  Location: [facility];  Service: Plastics;  Laterality: Right;  OVUM / OOCYTE RETRIEVAL     2010  and 2012   PERINEOPLASTY N/A 01/26/2022   Procedure: PERINEORRHAPY;  Surgeon: Marguerita Beards, MD;  Location: [facility];  Service: Gynecology;  Laterality: N/A;   PERINEOPLASTY N/A 02/19/2022   Procedure: PERINEOPLASTY revision;  Surgeon: Marguerita Beards, MD;  Location: [facility];  Service: Gynecology;  Laterality: N/A;   PORTACATH PLACEMENT Left 07/23/2020   Procedure: INSERTION PORT-A-CATH;  Surgeon: Emelia Loron, MD;  Location: [facility];  Service: General;  Laterality: Left;   THYROID LOBECTOMY Right 05/2010   @[facility]   VAGINAL HYSTERECTOMY  03/21/2019   @ [facility] by dr Mora Appl;   w/ Rogelio Seen culdoplasty   Patient Active Problem List   Diagnosis Date Noted   Osteopenia 12/31/2022   Prolapse of posterior vaginal wall 01/26/2022   Uterine prolapse 03/20/2021   Polyarthralgia 03/20/2021   Port-A-Cath in place 02/06/2021   Genetic testing 07/17/2020   Malignant neoplasm of upper-outer quadrant of right breast in female, estrogen receptor positive (HCC) 07/12/2020   Change in bowel habits 06/12/2016   Mixed hyperlipidemia 01/07/2016   Hypothyroidism, unspecified 01/07/2016   Allergic rhinitis 04/02/2015   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    PCP: Richardean Chimera, MD  REFERRING PROVIDER: Selmer Dominion, NP  REFERRING DIAG: [phone] (ICD-10-CM) - Post-operative state  THERAPY DIAG:  Other muscle spasm  Muscle weakness (generalized)  Abnormal posture  Unspecified lack of coordination  Rationale for Evaluation and Treatment: Rehabilitation  ONSET DATE: noticed at last pelvic exam in [place]  SUBJECTIVE:                                                                                                                                                                                           SUBJECTIVE STATEMENT: Pt reports 4 injections 2 left. Has not had intercourse for a couple weeks, unsure if pain is better.   PAIN:  Are you having pain? No - at this time. Hasn't had sex recently   PRECAUTIONS: Other: breast CA - not currently getting treatments   WEIGHT BEARING RESTRICTIONS: No  FALLS:  Has patient fallen in last 6 months? No  LIVING ENVIRONMENT: Lives with: lives with their family   OCCUPATION: PA  PLOF: Independent  PATIENT GOALS: to have no pain with sex  PERTINENT HISTORY:   Malignant neoplasm of upper-outer quadrant of right  breast in female, estrogen receptor positive (HCC)  Surgery: s/p Posterior repair, sacrospinous ligament fixation, perineorrhaphy, anal sphincteroplasty on 01/26/22 S/p EUA and revision perineoplasty on 02/21/22.  Sexual abuse: No  BOWEL MOVEMENT: Pain with bowel movement:  No Type of bowel movement: normal for pt Fully empty rectum: Yes:   Leakage: No Pads: No Fiber supplement: No  URINATION: Pain with urination: No Fully empty bladder: Yes:   Stream: Strong Urgency: No Frequency: not quicker than every 2 hours Leakage:  none Pads: No  INTERCOURSE: Pain with intercourse: Initial Penetration and During Penetration Ability to have vaginal penetration:  Yes: but painful Climax: not really due to pain Marinoff Scale: 2/3  PREGNANCY: Vaginal deliveries 2 Tearing Yes: 3rd deg tear posterior with first and anterior second  C-section deliveries 0 Currently pregnant No  PROLAPSE: None   OBJECTIVE:   DIAGNOSTIC FINDINGS:    COGNITION: Overall cognitive status: Within functional limits for tasks assessed     SENSATION: Light touch: Appears intact Proprioception: Appears intact  MUSCLE LENGTH: Hamstrings and adductors limited by 25%    POSTURE: rounded shoulders, forward head, and flexed trunk   LUMBARAROM/PROM:  A/PROM A/PROM  eval  Flexion Limited by 25%  Extension WFL  Right lateral flexion WFL  Left lateral flexion WFL  Right rotation WFL  Left rotation WFL   (Blank rows = not tested)  LOWER EXTREMITY ROM:  WFL  LOWER EXTREMITY MMT:  Hip abduction 3+/5 bil, all other 4/5, knees 5/5 PALPATION:   General  no TTP externally                 External Perineal Exam mild TTP at posterior vaginal opening externally 10/08/22 - TTP at bil labia minora however reports she needs to add her cream externally to help with tissue mobility and moisture.                              Internal Pelvic Floor bil bulbocavernosus, at vaginal opening at clock face 8-4  locations, no TTP at deep layers except near coccyx. perineal scar tissue restriction/tenderness,  10/08/22 - Lt bulbocavernosus, tension most felt at 4-5 on clock face of vaginal opening, also TTP throughout Lt pubococcygeus possible puborectalis with trigger points noted. Improving perineal scar mobility with mild restrictions now.   Patient confirms identification and approves PT to assess internal pelvic floor and treatment Yes  PELVIC MMT:   MMT eval 10/08/22  11/26/22   Vaginal 3/5, 8s, 10 reps 3/5, 10s, 10 reps - however focus of training has been improved pain management with decreased tension 4/5 5reps 8s  Internal Anal Sphincter     External Anal Sphincter     Puborectalis     Diastasis Recti     (Blank rows = not tested)        TONE: Pelvic floor atrophy   PROLAPSE: Not seen in hooklying  11/12/22 - anterior wall laxity with cough in hooklying and standing.   TODAY'S TREATMENT:  DATE: 12/17/22  manual work at posterior vaginal opening with gentle stretching for decreased pain with penetration.  Does still have soreness in this small localized area of bulbocavernosus region but greatly improved mobility after trigger point injection yesterday. Very minimal tension noted, did well with manual today.   3X10 pelvic floor contractions completed in hooklying with good technique and no bulge noted x5 pelvic floor contractions with isometrics 8s each Updated HEP for gentle hip strengthening and pelvic floor engagement, reviewed with pt and given print outs. Pt denied questions   01/05/23:  manual work at posterior vaginal opening with gentle stretching in downward direction, perineal body mobility completed in anterior/posterior directions as well for decreased pain with penetration.  Continued improvement with this noted, pt reports no longer using lidocaine  lubricant prior to sessions, did not need to use wand prior to intercourse last time either.  3X10 pelvic floor contractions completed in hooklying with good technique and no bulge noted 2x10 pelvic floor contractions with isometrics 8s each 2x10 quick flicks  01/11/23: NMRE: all exercises for exhale and pelvic floor contraction with activity for improved coordination of muscle activation and decreased stress at pelvic floor for decreased leakage.   2x10 bridges 2x10 hip abduction and ball press in sidelying each 2x10 Sit to stand body weight (second set with 10#) Standing alt marching x20 with 10# isometric at heart height Patient consented to internal pelvic floor treatment vaginally this date for manual work - at vaginal opening 3-9 on clock face focus of treatment with gentle stretching in downward direction, perineal body mobility completed in anterior/posterior directions as well for decreased pain with penetration.  Continued improvement with this noted, had very minimal soreness per pt. Did progress with pt's consent to bil stretching in Rt and Lt directions with gentle pressure for bulbocavernosus stretch for decreased pain with penetration. Pt tolerated well and denied increased pain.  2x10 pelvic floor contractions (3/5 strength most consistently but does demonstrate (5 reps of 4/5)  PATIENT EDUCATION:  Education details: [id number] Person educated: Patient Education method: Explanation, Demonstration, Tactile cues, Verbal cues, and Handouts Education comprehension: verbalized understanding and returned demonstration  HOME EXERCISE PROGRAM: [id number]    ASSESSMENT:  CLINICAL IMPRESSION: Patient presents for treatment today. Pt session focused on manual work at vaginal opening for decreased pain and improved tissue mobility for tolerance to medical exams and intercourse. Pt tolerated manual work well and continues to show improved mobility at today's session status post trigger point  injections (now 4/6 completed). Pt also demonstrating improved strength overall and greatly improved mobility. Session also progressed with including more strengthening based exercises to improve pelvic floor strength and pelvic stability and pressure management. Pt would benefit from additional PT to continue to progress further toward goals with improved pelvic floor strength and decreased pain with penetration, progress pelvic floor strengthening and coordination with activity/exercise for decreased risk of re-injury to pelvic floor and prolapse and improve pelvic floor strength to improve prolapse as much as possible.   OBJECTIVE IMPAIRMENTS: decreased coordination, decreased endurance, decreased mobility, decreased strength, increased fascial restrictions, increased muscle spasms, impaired flexibility, improper body mechanics, postural dysfunction, and pain.   ACTIVITY LIMITATIONS: sitting and intercourse, medical assessments  with internal needs  PARTICIPATION LIMITATIONS: interpersonal relationship and community activity  PERSONAL FACTORS: 1 comorbidity: medical history  are also affecting patient's functional outcome.   REHAB POTENTIAL: Good  CLINICAL DECISION MAKING: Stable/uncomplicated  EVALUATION COMPLEXITY: Low   GOALS: Goals reviewed with patient? Yes  SHORT  TERM GOALS: Target date: 5//10/24  Pt to be I with advanced HEP.  Baseline: Goal status: MET  2.  Pt will report no more than 5/10 pain due to improvements in posture, strength, and muscle length  Baseline:  Goal status: MET   LONG TERM GOALS: Target date: 10/23/22  Pt to be I with advanced HEP.  Baseline:  Goal status: MET 2.  Pt will report no more than 2/10 pain due to improvements in posture, strength, and muscle length  Baseline:  Goal status: on going  3.  Pt to report improved tolerance to vaginal penetration for improved ability to be able to climax at least 75% of the instance of intercourse.   Baseline:  Goal status: on going  4.  Pt to be I with manual self stretching of perineal body/vaginal opening to improve tissue mobility and decrease pain at home.  Baseline:  Goal status: MET  5.  Pt to demonstrate at least 4/5 pelvic floor strength with ability to fully relax post contraction for improved pelvic stability and decreased strain at pelvic floor/ decrease leakage.  Baseline:  Goal status: MET  6.  Pt to demonstrate at least 5/5 bil hip strength with good coordination of pelvic floor and breathing  for improved pelvic stability and functional squats without leakage.   Baseline:  Goal status: MET   PLAN:  PT FREQUENCY: 1x/week  PT DURATION:  8 sessions  PLANNED INTERVENTIONS: Therapeutic exercises, Therapeutic activity, Neuromuscular re-education, Balance training, Gait training, Patient/Family education, Self Care, Aquatic Therapy, Dry Needling, Spinal mobilization, Cryotherapy, Moist heat, scar mobilization, Taping, Biofeedback, and Manual therapy  PLAN FOR NEXT SESSION: manual work at perineal mobility, scar mobility, core and hip strengthening, coordination of pelvic floor and breathing mechanics   Otelia Sergeant, PT, DPT 09/30/244:16 PM

## 2023-01-14 ENCOUNTER — Encounter: Payer: Self-pay | Admitting: Hematology and Oncology

## 2023-01-15 ENCOUNTER — Encounter: Payer: Self-pay | Admitting: Obstetrics and Gynecology

## 2023-01-15 ENCOUNTER — Ambulatory Visit: Payer: BC Managed Care – PPO | Admitting: Obstetrics and Gynecology

## 2023-01-15 VITALS — BP 125/84 | HR 72

## 2023-01-15 DIAGNOSIS — L905 Scar conditions and fibrosis of skin: Secondary | ICD-10-CM

## 2023-01-15 DIAGNOSIS — N9089 Other specified noninflammatory disorders of vulva and perineum: Secondary | ICD-10-CM

## 2023-01-15 MED ORDER — TRIAMCINOLONE ACETONIDE 40 MG/ML IJ SUSP
20.0000 mg | Freq: Once | INTRAMUSCULAR | Status: AC
Start: 2023-01-15 — End: 2023-01-22
  Administered 2023-01-22: 20 mg via INTRAMUSCULAR

## 2023-01-15 MED ORDER — BUPIVACAINE HCL 0.25 % IJ SOLN
4.0000 mL | Freq: Once | INTRAMUSCULAR | Status: AC
Start: 2023-01-15 — End: 2023-01-22
  Administered 2023-01-22: 4 mL

## 2023-01-15 NOTE — Progress Notes (Signed)
Ms. SHAINA GULLATT is a 52 y.o. female who presents for trigger point injection at perineum, series #5.   Vitals:   01/15/23 0903  BP: 125/84  Pulse: 72     Indication(s): perineal scar pain  Informed Consent:  The alternatives, risks and benefits of the procedure were explained to the patient. Risks including, but not limited to discomfort, pain, bleeding, infection, injury to nearby structures, inability to perform the procedure, failure of the procedure were discussed.  All questions were answered and the patient elected to proceed.  Procedure:   The patient was positioned in dorsal lithotomy position.  The vaginal tissues were prepped with Hibiclens solution.  An injection of 5cc of a mixture of 90% anesthetic (0.25% Bupivacaine) and 10% 40mg /ml Triamcinolone acetomide (Kenalog) was performed in single at the introitus.  Pressure was held over bleeding areas until good hemostasis was achieved.   The patient tolerated the procedure well with no apparent complications. Return 1 week  Marguerita Beards, MD

## 2023-01-18 ENCOUNTER — Encounter: Payer: Self-pay | Admitting: Hematology and Oncology

## 2023-01-22 ENCOUNTER — Ambulatory Visit: Payer: BC Managed Care – PPO | Admitting: Obstetrics and Gynecology

## 2023-01-22 ENCOUNTER — Encounter: Payer: Self-pay | Admitting: Obstetrics and Gynecology

## 2023-01-22 VITALS — BP 95/65 | HR 71

## 2023-01-22 DIAGNOSIS — L905 Scar conditions and fibrosis of skin: Secondary | ICD-10-CM

## 2023-01-22 DIAGNOSIS — N9089 Other specified noninflammatory disorders of vulva and perineum: Secondary | ICD-10-CM

## 2023-01-22 NOTE — Progress Notes (Signed)
[name] is a 52 y.o. female who presents for trigger point injection at perineum, series #6.   Vitals:   01/22/23 0812  BP: 95/65  Pulse: 71     Indication(s): perineal scar pain  Informed Consent:  The alternatives, risks and benefits of the procedure were explained to the patient. Risks including, but not limited to discomfort, pain, bleeding, infection, injury to nearby structures, inability to perform the procedure, failure of the procedure were discussed.  All questions were answered and the patient elected to proceed.  Procedure:   The patient was positioned in dorsal lithotomy position.  The vaginal tissues were prepped with Hibiclens solution.  An injection of 5cc of a mixture of 90% anesthetic (0.25% Bupivacaine) and 10% 40mg /ml Triamcinolone acetomide (Kenalog) was performed in single at the introitus.  Pressure was held over bleeding areas until good hemostasis was achieved.   The patient tolerated the procedure well with no apparent complications. Return 1 month for follow up  Marguerita Beards, MD

## 2023-01-25 ENCOUNTER — Encounter: Payer: Self-pay | Admitting: Physical Therapy

## 2023-01-29 ENCOUNTER — Ambulatory Visit: Payer: BC Managed Care – PPO | Admitting: Obstetrics and Gynecology

## 2023-02-04 ENCOUNTER — Ambulatory Visit
Admission: RE | Admit: 2023-02-04 | Discharge: 2023-02-04 | Disposition: A | Discharge status: Home / Self Care | Payer: BC Managed Care – PPO | Source: Ambulatory Visit | Attending: Hematology and Oncology | Admitting: Hematology and Oncology

## 2023-02-04 DIAGNOSIS — R928 Other abnormal and inconclusive findings on diagnostic imaging of breast: Secondary | ICD-10-CM

## 2023-02-18 ENCOUNTER — Ambulatory Visit: Payer: BC Managed Care – PPO | Attending: Obstetrics and Gynecology | Admitting: Physical Therapy

## 2023-02-18 DIAGNOSIS — M62838 Other muscle spasm: Secondary | ICD-10-CM | POA: Diagnosis present

## 2023-02-18 DIAGNOSIS — R279 Unspecified lack of coordination: Secondary | ICD-10-CM | POA: Diagnosis present

## 2023-02-18 DIAGNOSIS — M6281 Muscle weakness (generalized): Secondary | ICD-10-CM | POA: Diagnosis present

## 2023-02-18 NOTE — Therapy (Signed)
OUTPATIENT PHYSICAL THERAPY FEMALE PELVIC TREATMENT   Patient Name: Sandy Goodwin MRN: [id number] DOB:1971/02/22, 52 y.o., female Today's Date: 02/18/2023   END OF SESSION:  PT End of Session - 02/18/23 1451     Visit Number 18    Date for PT Re-Evaluation 05/13/23    Authorization Type [employer]    PT Start Time 1446    PT Stop Time 1526    PT Time Calculation (min) 40 min    Activity Tolerance Patient tolerated treatment well    Behavior During Therapy [facility] for tasks assessed/performed               Past Medical History:  Diagnosis Date   Full incontinence-feces    History of anemia    History of cancer chemotherapy    right breast;   neo-adjuvent 07-24-2020  to 11-15-2020  and 12-05-2020  to 07-24-2021   History of external beam radiation therapy    right reast 02-04-2021  to 03-25-2021   History of gastric ulcer    remote hx -- resolved   Hypothyroidism, postsurgical    followed by pcp;     02/ 2012  s/p  right hemithyroidectomy  (follicular adenoma)   Malignant neoplasm of upper-outer quadrant of right breast in female, estrogen receptor positive (HCC) 06/2020   oncologist--- dr Pamelia Hoit;  DCIS high grade right breast , neo-adjuvant chemo completed 11-15-2020,  s/p right breast lumpectomy w/ node dissection on 12-11-2020, then chemo completed 07-24-2021 and radiation completed 03-25-2021   Polyarthralgia    PONV (postoperative nausea and vomiting)    With first surgery, none since   Prolapse of vaginal vault after hysterectomy    and posterior prolapse   RA (rheumatoid arthritis) (HCC)    rheumatologsit--- dr Dierdre Forth;  no treatment   Tachycardia    Tachycardia   Past Surgical History:  Procedure Laterality Date   ANTERIOR AND POSTERIOR REPAIR WITH SACROSPINOUS FIXATION N/A 01/26/2022   Procedure: POSTERIOR REPAIR WITH SACROSPINOUS FIXATION;  Surgeon: Marguerita Beards, MD;  Location: [facility];  Service: Gynecology;  Laterality: N/A;  total time  requested is 2 hours   APPENDECTOMY  08/2006   exploratory laparatomy w/ appendectomy (ruptured) and repair bowel   BREAST BIOPSY Right 07/30/2022   MM RT BREAST BX W LOC DEV 1ST LESION IMAGE BX SPEC STEREO GUIDE 07/30/2022 [facility]   BREAST LUMPECTOMY WITH RADIOACTIVE SEED AND SENTINEL LYMPH NODE BIOPSY Right 12/11/2020   Procedure: RIGHT BREAST LUMPECTOMY WITH RADIOACTIVE SEED X2 AND RIGHT AXILLARY SENTINEL LYMPH NODE BIOPSY;  Surgeon: Emelia Loron, MD;  Location: [facility];  Service: General;  Laterality: Right;   BREAST RECONSTRUCTION Right 12/20/2020   Procedure: RIGHT ONCOPLASTIC RECONSTRUCTION;  Surgeon: Glenna Fellows, MD;  Location: [facility];  Service: Plastics;  Laterality: Right;   BREAST REDUCTION SURGERY Left 12/20/2020   Procedure: LEFT BREAST REDUCTION;  Surgeon: Glenna Fellows, MD;  Location: [facility];  Service: Plastics;  Laterality: Left;   COLONOSCOPY N/A 06/15/2016   Procedure: COLONOSCOPY;  Surgeon: Malissa Hippo, MD;  Location[facility];  Service: Endoscopy;  Laterality: N/A;  845   DILATION AND CURETTAGE OF UTERUS  06/03/2011   w/ suction for miscarriage   EXCISION OF BREAST BIOPSY Right 1999   INCISION AND DRAINAGE OF WOUND Right 01/10/2021   Procedure: INCISION AND DRAINAGE OF RIGHT AXILLA;  Surgeon: Glenna Fellows, MD;  Location: [facility];  Service: Plastics;  Laterality: Right;  OVUM / OOCYTE RETRIEVAL     2010  and 2012   PERINEOPLASTY N/A 01/26/2022   Procedure: PERINEORRHAPY;  Surgeon: Marguerita Beards, MD;  Location: [facility];  Service: Gynecology;  Laterality: N/A;   PERINEOPLASTY N/A 02/19/2022   Procedure: PERINEOPLASTY revision;  Surgeon: Marguerita Beards, MD;  Location: [facility];  Service: Gynecology;  Laterality: N/A;   PORTACATH PLACEMENT Left 07/23/2020   Procedure: INSERTION PORT-A-CATH;  Surgeon: Emelia Loron, MD;  Location: [facility];  Service: General;  Laterality: Left;   THYROID LOBECTOMY Right 05/2010   @[facility]   VAGINAL HYSTERECTOMY  03/21/2019   @ [facility] by dr Mora Appl;   w/ Rogelio Seen culdoplasty   Patient Active Problem List   Diagnosis Date Noted   Osteopenia 12/31/2022   Prolapse of posterior vaginal wall 01/26/2022   Uterine prolapse 03/20/2021   Polyarthralgia 03/20/2021   Port-A-Cath in place 02/06/2021   Genetic testing 07/17/2020   Malignant neoplasm of upper-outer quadrant of right breast in female, estrogen receptor positive (HCC) 07/12/2020   Change in bowel habits 06/12/2016   Mixed hyperlipidemia 01/07/2016   Hypothyroidism, unspecified 01/07/2016   Allergic rhinitis 04/02/2015   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    PCP: Richardean Chimera, MD  REFERRING PROVIDER: Selmer Dominion, NP  REFERRING DIAG: [phone] (ICD-10-CM) - Post-operative state  THERAPY DIAG:  Other muscle spasm  Muscle weakness (generalized)  Unspecified lack of coordination  Rationale for Evaluation and Treatment: Rehabilitation  ONSET DATE: noticed at last pelvic exam in [place]  SUBJECTIVE:                                                                                                                                                                                           SUBJECTIVE STATEMENT: Pt has completed injections feeling much better, does have a little pain at one spot with palpation or with intercourse but this is better. Would like to have no pain with this. Also still feeling pressure from prolapse, but not every day.   PAIN:  Are you having pain? No - at this time.  1/10 at area at vaginal opening   PRECAUTIONS: Other: breast CA - not currently getting treatments   WEIGHT BEARING RESTRICTIONS: No  FALLS:  Has patient fallen in last 6 months? No  LIVING ENVIRONMENT: Lives with: lives with their family   OCCUPATION: PA  PLOF:  Independent  PATIENT GOALS: to have no pain with sex  PERTINENT HISTORY:   Malignant neoplasm of upper-outer quadrant of right breast in female, estrogen receptor positive (HCC)  Surgery: s/p Posterior repair, sacrospinous ligament fixation,  perineorrhaphy, anal sphincteroplasty on 01/26/22 S/p EUA and revision perineoplasty on 02/21/22.  Sexual abuse: No  BOWEL MOVEMENT: Pain with bowel movement: No Type of bowel movement: normal for pt Fully empty rectum: Yes:   Leakage: No Pads: No Fiber supplement: No  URINATION: Pain with urination: No Fully empty bladder: Yes:   Stream: Strong Urgency: No Frequency: not quicker than every 2 hours Leakage:  none Pads: No  INTERCOURSE: Pain with intercourse: Initial Penetration and During Penetration Ability to have vaginal penetration:  Yes: but painful Climax: not really due to pain Marinoff Scale: 2/3  PREGNANCY: Vaginal deliveries 2 Tearing Yes: 3rd deg tear posterior with first and anterior second  C-section deliveries 0 Currently pregnant No  PROLAPSE: None   OBJECTIVE:   DIAGNOSTIC FINDINGS:    COGNITION: Overall cognitive status: Within functional limits for tasks assessed     SENSATION: Light touch: Appears intact Proprioception: Appears intact  MUSCLE LENGTH: Hamstrings and adductors limited by 25%    POSTURE: rounded shoulders, forward head, and flexed trunk   LUMBARAROM/PROM:  A/PROM A/PROM  eval  Flexion Limited by 25%  Extension WFL  Right lateral flexion WFL  Left lateral flexion WFL  Right rotation WFL  Left rotation WFL   (Blank rows = not tested)  LOWER EXTREMITY ROM:  WFL  LOWER EXTREMITY MMT:  Hip abduction 3+/5 bil, all other 4/5, knees 5/5 PALPATION:   General  no TTP externally                 External Perineal Exam mild TTP at posterior vaginal opening externally 10/08/22 - TTP at bil labia minora however reports she needs to add her cream externally to help with tissue  mobility and moisture.                              Internal Pelvic Floor bil bulbocavernosus, at vaginal opening at clock face 8-4 locations, no TTP at deep layers except near coccyx. perineal scar tissue restriction/tenderness,  10/08/22 - Lt bulbocavernosus, tension most felt at 4-5 on clock face of vaginal opening, also TTP throughout Lt pubococcygeus possible puborectalis with trigger points noted. Improving perineal scar mobility with mild restrictions now.   Patient confirms identification and approves PT to assess internal pelvic floor and treatment Yes  PELVIC MMT:   MMT eval 10/08/22  11/26/22   Vaginal 3/5, 8s, 10 reps 3/5, 10s, 10 reps - however focus of training has been improved pain management with decreased tension 4/5 5reps 8s  Internal Anal Sphincter     External Anal Sphincter     Puborectalis     Diastasis Recti     (Blank rows = not tested)        TONE: Pelvic floor atrophy   PROLAPSE: Not seen in hooklying  11/12/22 - anterior wall laxity with cough in hooklying and standing.   TODAY'S TREATMENT:  DATE: 01/05/23:  manual work at posterior vaginal opening with gentle stretching in downward direction, perineal body mobility completed in anterior/posterior directions as well for decreased pain with penetration.  Continued improvement with this noted, pt reports no longer using lidocaine lubricant prior to sessions, did not need to use wand prior to intercourse last time either.  3X10 pelvic floor contractions completed in hooklying with good technique and no bulge noted 2x10 pelvic floor contractions with isometrics 8s each 2x10 quick flicks  01/11/23: NMRE: all exercises for exhale and pelvic floor contraction with activity for improved coordination of muscle activation and decreased stress at pelvic floor for decreased leakage.   2x10  bridges 2x10 hip abduction and ball press in sidelying each 2x10 Sit to stand body weight (second set with 10#) Standing alt marching x20 with 10# isometric at heart height Patient consented to internal pelvic floor treatment vaginally this date for manual work - at vaginal opening 3-9 on clock face focus of treatment with gentle stretching in downward direction, perineal body mobility completed in anterior/posterior directions as well for decreased pain with penetration.  Continued improvement with this noted, had very minimal soreness per pt. Did progress with pt's consent to bil stretching in Rt and Lt directions with gentle pressure for bulbocavernosus stretch for decreased pain with penetration. Pt tolerated well and denied increased pain.  2x10 pelvic floor contractions (3/5 strength most consistently but does demonstrate (5 reps of 4/5)  02/18/23: Patient consented to internal pelvic floor treatment vaginally this date for manual work - at vaginal opening 3-6 on clock face focus of treatment with gentle stretching in downward direction, perineal body mobility completed in anterior/posterior and in rt/lt directions as well for decreased pain with penetration.  Great  improvement noted, had very minimal soreness per pt 1/10. Pt tolerated well and denied increased pain.  2x10 pelvic floor contractions (3/5 strength most consistently) Pt educated on possible benefit of vaginal weights for strengthening but encouraged to ask MD about this at tomorrows appointment as well 2x10 bridges 10# 2x10 sidelying hip abduction with ball press 3# Squats 10# 2x10 [place] 15# Standing alt marching 5# x20    PATIENT EDUCATION:  Education details: [id number] Person educated: Patient Education method: Programmer, multimedia, Facilities manager, Actor cues, Verbal cues, and Handouts Education comprehension: verbalized understanding and returned demonstration  HOME EXERCISE PROGRAM: [id number]    ASSESSMENT:  CLINICAL  IMPRESSION: Patient presents for treatment today. Pt session focused on manual work at vaginal opening for decreased pain and improved tissue mobility for tolerance to medical exams and intercourse. Pt tolerated manual work well and continues to show marked improved mobility at today's session status post trigger point injections (now 6/6 completed but reports due to remaining discomfort may have few additional injections pending discussion with MD tomorrow). Session also progressed with including more strengthening based exercises to improve pelvic floor strength and pelvic stability and pressure management. Pt would benefit from additional PT to continue to progress further toward goals with improved pelvic floor strength and decreased pain with penetration, progress pelvic floor strengthening and coordination with activity/exercise for decreased risk of re-injury to pelvic floor and prolapse and improve pelvic floor strength to improve prolapse as much as possible.   OBJECTIVE IMPAIRMENTS: decreased coordination, decreased endurance, decreased mobility, decreased strength, increased fascial restrictions, increased muscle spasms, impaired flexibility, improper body mechanics, postural dysfunction, and pain.   ACTIVITY LIMITATIONS: sitting and intercourse, medical assessments  with internal needs  PARTICIPATION LIMITATIONS: interpersonal relationship and community activity  PERSONAL  FACTORS: 1 comorbidity: medical history  are also affecting patient's functional outcome.   REHAB POTENTIAL: Good  CLINICAL DECISION MAKING: Stable/uncomplicated  EVALUATION COMPLEXITY: Low   GOALS: Goals reviewed with patient? Yes  SHORT TERM GOALS: Target date: 08/21/22  Pt to be I with advanced HEP.  Baseline: Goal status: MET  2.  Pt will report no more than 5/10 pain due to improvements in posture, strength, and muscle length  Baseline:  Goal status: MET   LONG TERM GOALS: Target date: 10/23/22  Pt  to be I with advanced HEP.  Baseline:  Goal status: MET 2.  Pt will report no more than 2/10 pain due to improvements in posture, strength, and muscle length  Baseline:  Goal status: on going  3.  Pt to report improved tolerance to vaginal penetration for improved ability to be able to climax at least 75% of the instance of intercourse.  Baseline:  Goal status: on going  4.  Pt to be I with manual self stretching of perineal body/vaginal opening to improve tissue mobility and decrease pain at home.  Baseline:  Goal status: MET  5.  Pt to demonstrate at least 4/5 pelvic floor strength with ability to fully relax post contraction for improved pelvic stability and decreased strain at pelvic floor/ decrease leakage.  Baseline:  Goal status: MET  6.  Pt to demonstrate at least 5/5 bil hip strength with good coordination of pelvic floor and breathing  for improved pelvic stability and functional squats without leakage.   Baseline:  Goal status: MET   PLAN:  PT FREQUENCY: 1x/week  PT DURATION:  8 sessions  PLANNED INTERVENTIONS: Therapeutic exercises, Therapeutic activity, Neuromuscular re-education, Balance training, Gait training, Patient/Family education, Self Care, Aquatic Therapy, Dry Needling, Spinal mobilization, Cryotherapy, Moist heat, scar mobilization, Taping, Biofeedback, and Manual therapy  PLAN FOR NEXT SESSION: manual work at perineal mobility, scar mobility, core and hip strengthening, coordination of pelvic floor and breathing mechanics   Otelia Sergeant, PT, DPT 11/07/243:35 PM

## 2023-02-19 ENCOUNTER — Encounter: Payer: Self-pay | Admitting: Obstetrics and Gynecology

## 2023-02-19 ENCOUNTER — Ambulatory Visit (INDEPENDENT_AMBULATORY_CARE_PROVIDER_SITE_OTHER): Payer: BC Managed Care – PPO | Admitting: Obstetrics and Gynecology

## 2023-02-19 ENCOUNTER — Ambulatory Visit: Payer: BC Managed Care – PPO | Admitting: Obstetrics and Gynecology

## 2023-02-19 VITALS — BP 111/78 | HR 76

## 2023-02-19 DIAGNOSIS — N9089 Other specified noninflammatory disorders of vulva and perineum: Secondary | ICD-10-CM

## 2023-02-19 DIAGNOSIS — Z48816 Encounter for surgical aftercare following surgery on the genitourinary system: Secondary | ICD-10-CM

## 2023-02-19 NOTE — Progress Notes (Signed)
[facility]  Date of Visit: 02/19/2023  History of Present Illness: [name] is a 52 y.o. female scheduled today for a post-operative visit.   Surgery: s/p Posterior repair, sacrospinous ligament fixation, perineorrhaphy, anal sphincteroplasty on 01/26/22 S/p EUA and revision perineoplasty on 02/21/22.   Area at perineum is a lot less sensitive, no longer feels the knot after trigger point injections. Able to work on scar mobility with PT.     Medications: She has a current medication list which includes the following prescription(s): acetaminophen, anastrozole, baclofen, bystolic, cetirizine, clobetasol ointment, estradiol, gabapentin, levothyroxine, lidocaine, metronidazole, pantoprazole, polyethylene glycol 3350, triamcinolone, and wegovy, and the following Facility-Administered Medications: bupivacaine and triamcinolone acetonide.   Allergies: Patient is allergic to codeine, hyaluronic acid [sodium hyaluronate (non-avian)], hydrocodone, other, latex, and wound dressing adhesive.   Physical Exam: BP 111/78   Pulse 76    Perineal Incision: intact Palpated perineum- no tenderness or indurated areas present, no erythema  ---------------------------------------------------------  Assessment and Plan:  1. Scar, vulva    - Sensitivity improved. Previously used compounded gabapentin/ baclofen/ amitriptyline cream but did not see much benefit when it was more sensitive. Discussed trying to use it again to see if it improves symptoms. Can also use lidocaine cram prior to manual work or intercourse.  - Not interested in treatment for anterior vaginal wall prolapse at this time.   Return as needed   Marguerita Beards, MD  Time spent: I spent 20 minutes dedicated to the care of this patient on the date of this encounter to include pre-visit review of records, face-to-face time with the patient  and post visit documentation.

## 2023-04-05 ENCOUNTER — Ambulatory Visit: Payer: BC Managed Care – PPO | Admitting: Physical Therapy

## 2023-04-15 ENCOUNTER — Encounter: Payer: Self-pay | Admitting: Hematology and Oncology

## 2023-04-15 ENCOUNTER — Encounter: Payer: BC Managed Care – PPO | Admitting: Physical Therapy

## 2023-04-22 ENCOUNTER — Encounter: Payer: BC Managed Care – PPO | Admitting: Physical Therapy

## 2023-04-29 ENCOUNTER — Ambulatory Visit: Payer: BC Managed Care – PPO | Attending: Obstetrics and Gynecology | Admitting: Physical Therapy

## 2023-04-29 DIAGNOSIS — R279 Unspecified lack of coordination: Secondary | ICD-10-CM | POA: Diagnosis present

## 2023-04-29 DIAGNOSIS — M62838 Other muscle spasm: Secondary | ICD-10-CM

## 2023-04-29 DIAGNOSIS — M6281 Muscle weakness (generalized): Secondary | ICD-10-CM

## 2023-04-29 NOTE — Therapy (Signed)
OUTPATIENT PHYSICAL THERAPY FEMALE PELVIC TREATMENT   Patient Name: Sandy Goodwin MRN: [id number] DOB:September 16, 1970, 53 y.o., female Today's Date: 04/29/2023   END OF SESSION:  PT End of Session - 04/29/23 1517     Visit Number 19    Date for PT Re-Evaluation 05/13/23    Authorization Type [employer]    PT Start Time 1445    PT Stop Time 1526    PT Time Calculation (min) 41 min    Activity Tolerance Patient tolerated treatment well    Behavior During Therapy [facility] for tasks assessed/performed                Past Medical History:  Diagnosis Date   Full incontinence-feces    History of anemia    History of cancer chemotherapy    right breast;   neo-adjuvent 07-24-2020  to 11-15-2020  and 12-05-2020  to 07-24-2021   History of external beam radiation therapy    right reast 02-04-2021  to 03-25-2021   History of gastric ulcer    remote hx -- resolved   Hypothyroidism, postsurgical    followed by pcp;     02/ 2012  s/p  right hemithyroidectomy  (follicular adenoma)   Malignant neoplasm of upper-outer quadrant of right breast in female, estrogen receptor positive (HCC) 06/2020   oncologist--- dr Pamelia Hoit;  DCIS high grade right breast , neo-adjuvant chemo completed 11-15-2020,  s/p right breast lumpectomy w/ node dissection on 12-11-2020, then chemo completed 07-24-2021 and radiation completed 03-25-2021   Polyarthralgia    PONV (postoperative nausea and vomiting)    With first surgery, none since   Prolapse of vaginal vault after hysterectomy    and posterior prolapse   RA (rheumatoid arthritis) (HCC)    rheumatologsit--- dr Dierdre Forth;  no treatment   Tachycardia    Tachycardia   Past Surgical History:  Procedure Laterality Date   ANTERIOR AND POSTERIOR REPAIR WITH SACROSPINOUS FIXATION N/A 01/26/2022   Procedure: POSTERIOR REPAIR WITH SACROSPINOUS FIXATION;  Surgeon: Marguerita Beards, MD;  Location: [facility];  Service: Gynecology;  Laterality: N/A;  total time  requested is 2 hours   APPENDECTOMY  08/2006   exploratory laparatomy w/ appendectomy (ruptured) and repair bowel   BREAST BIOPSY Right 07/30/2022   MM RT BREAST BX W LOC DEV 1ST LESION IMAGE BX SPEC STEREO GUIDE 07/30/2022 [facility]   BREAST LUMPECTOMY WITH RADIOACTIVE SEED AND SENTINEL LYMPH NODE BIOPSY Right 12/11/2020   Procedure: RIGHT BREAST LUMPECTOMY WITH RADIOACTIVE SEED X2 AND RIGHT AXILLARY SENTINEL LYMPH NODE BIOPSY;  Surgeon: Emelia Loron, MD;  Location: [facility];  Service: General;  Laterality: Right;   BREAST RECONSTRUCTION Right 12/20/2020   Procedure: RIGHT ONCOPLASTIC RECONSTRUCTION;  Surgeon: Glenna Fellows, MD;  Location: [facility];  Service: Plastics;  Laterality: Right;   BREAST REDUCTION SURGERY Left 12/20/2020   Procedure: LEFT BREAST REDUCTION;  Surgeon: Glenna Fellows, MD;  Location: [facility];  Service: Plastics;  Laterality: Left;   COLONOSCOPY N/A 06/15/2016   Procedure: COLONOSCOPY;  Surgeon: Malissa Hippo, MD;  [facility];  Service: Endoscopy;  Laterality: N/A;  845   DILATION AND CURETTAGE OF UTERUS  06/03/2011   w/ suction for miscarriage   EXCISION OF BREAST BIOPSY Right 1999   INCISION AND DRAINAGE OF WOUND Right 01/10/2021   Procedure: INCISION AND DRAINAGE OF RIGHT AXILLA;  Surgeon: Glenna Fellows, MD;  Location: [facility];  Service: Plastics;  Laterality:  Right;   OVUM / OOCYTE RETRIEVAL     2010  and 2012   PERINEOPLASTY N/A 01/26/2022   Procedure: PERINEORRHAPY;  Surgeon: Marguerita Beards, MD;  Location: [facility];  Service: Gynecology;  Laterality: N/A;   PERINEOPLASTY N/A 02/19/2022   Procedure: PERINEOPLASTY revision;  Surgeon: Marguerita Beards, MD;  Location: [facility];  Service: Gynecology;  Laterality: N/A;   PORTACATH PLACEMENT Left 07/23/2020   Procedure: INSERTION PORT-A-CATH;  Surgeon: Emelia Loron, MD;  Location: [facility];  Service: General;  Laterality: Left;   THYROID LOBECTOMY Right 05/2010   @[facility]   VAGINAL HYSTERECTOMY  03/21/2019   @ [facility] by dr Mora Appl;   w/ Rogelio Seen culdoplasty   Patient Active Problem List   Diagnosis Date Noted   Osteopenia 12/31/2022   Prolapse of posterior vaginal wall 01/26/2022   Uterine prolapse 03/20/2021   Polyarthralgia 03/20/2021   Port-A-Cath in place 02/06/2021   Genetic testing 07/17/2020   Malignant neoplasm of upper-outer quadrant of right breast in female, estrogen receptor positive (HCC) 07/12/2020   Change in bowel habits 06/12/2016   Mixed hyperlipidemia 01/07/2016   Hypothyroidism, unspecified 01/07/2016   Allergic rhinitis 04/02/2015   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    PCP: Richardean Chimera, MD  REFERRING PROVIDER: Selmer Dominion, NP  REFERRING DIAG: [id number] (ICD-10-CM) - Post-operative state  THERAPY DIAG:  Muscle weakness (generalized)  Unspecified lack of coordination  Other muscle spasm  Rationale for Evaluation and Treatment: Rehabilitation  ONSET DATE: noticed at last pelvic exam in [place]  SUBJECTIVE:                                                                                                                                                                                           SUBJECTIVE STATEMENT: Pain with penetration 1-2/10 maintains and not resolved. "It is markedly improved". No longer than needs wand prior to intercourse Prolapse symptoms worse after being on feet a lot (more than usual), relief positions do help.  Does have urinary incontinence after voiding and stands and does have some dripping and even enough to wet underwear and unable to stop it.   PAIN:  Are you having pain? No - at this time.  1/10 at area at vaginal opening   PRECAUTIONS: Other: breast CA - not currently getting treatments   WEIGHT BEARING RESTRICTIONS: No  FALLS:  Has  patient fallen in last 6 months? No  LIVING ENVIRONMENT: Lives with: lives with their family   OCCUPATION: PA  PLOF: Independent  PATIENT GOALS: to have no pain with sex  PERTINENT HISTORY:  Malignant neoplasm of upper-outer quadrant of right breast in female, estrogen receptor positive [facility])  Surgery: s/p Posterior repair, sacrospinous ligament fixation, perineorrhaphy, anal sphincteroplasty on 01/26/22 S/p EUA and revision perineoplasty on 02/21/22.  Sexual abuse: No  BOWEL MOVEMENT: Pain with bowel movement: No Type of bowel movement: normal for pt Fully empty rectum: Yes:   Leakage: No Pads: No Fiber supplement: No  URINATION: Pain with urination: No Fully empty bladder: Yes:   Stream: Strong Urgency: No Frequency: not quicker than every 2 hours Leakage:  none Pads: No  INTERCOURSE: Pain with intercourse: Initial Penetration and During Penetration Ability to have vaginal penetration:  Yes: but painful Climax: not really due to pain Marinoff Scale: 2/3  PREGNANCY: Vaginal deliveries 2 Tearing Yes: 3rd deg tear posterior with first and anterior second  C-section deliveries 0 Currently pregnant No  PROLAPSE: None   OBJECTIVE:   DIAGNOSTIC FINDINGS:    COGNITION: Overall cognitive status: Within functional limits for tasks assessed     SENSATION: Light touch: Appears intact Proprioception: Appears intact  MUSCLE LENGTH: Hamstrings and adductors limited by 25%    POSTURE: rounded shoulders, forward head, and flexed trunk   LUMBARAROM/PROM:  A/PROM A/PROM  eval  Flexion Limited by 25%  Extension WFL  Right lateral flexion WFL  Left lateral flexion WFL  Right rotation WFL  Left rotation WFL   (Blank rows = not tested)  LOWER EXTREMITY ROM:  WFL  LOWER EXTREMITY MMT:  Hip abduction 3+/5 bil, all other 4/5, knees 5/5 PALPATION:   General  no TTP externally                 External Perineal Exam mild TTP at posterior vaginal  opening externally 10/08/22 - TTP at bil labia minora however reports she needs to add her cream externally to help with tissue mobility and moisture.                              Internal Pelvic Floor bil bulbocavernosus, at vaginal opening at clock face 8-4 locations, no TTP at deep layers except near coccyx. perineal scar tissue restriction/tenderness,  10/08/22 - Lt bulbocavernosus, tension most felt at 4-5 on clock face of vaginal opening, also TTP throughout Lt pubococcygeus possible puborectalis with trigger points noted. Improving perineal scar mobility with mild restrictions now.   Patient confirms identification and approves PT to assess internal pelvic floor and treatment Yes  PELVIC MMT:   MMT eval 10/08/22  11/26/22  04/29/23   Vaginal 3/5, 8s, 10 reps 3/5, 10s, 10 reps - however focus of training has been improved pain management with decreased tension 4/5 5reps 8s 4/5, 10s, 7 reps  Internal Anal Sphincter      External Anal Sphincter      Puborectalis      Diastasis Recti      (Blank rows = not tested)        TONE: Pelvic floor atrophy   PROLAPSE: Not seen in hooklying  11/12/22 - anterior wall laxity with cough in hooklying and standing.   TODAY'S TREATMENT:  DATE: 02/18/23: Patient consented to internal pelvic floor treatment vaginally this date for manual work - at vaginal opening 3-6 on clock face focus of treatment with gentle stretching in downward direction, perineal body mobility completed in anterior/posterior and in rt/lt directions as well for decreased pain with penetration.  Great  improvement noted, had very minimal soreness per pt 1/10. Pt tolerated well and denied increased pain.  2x10 pelvic floor contractions (3/5 strength most consistently) Pt educated on possible benefit of vaginal weights for strengthening but encouraged to ask MD  about this at tomorrows appointment as well 2x10 bridges 10# 2x10 sidelying hip abduction with ball press 3# Squats 10# 2x10 [employer] 15# Standing alt marching 5# x20   04/29/23:  Patient consented to internal pelvic floor treatment vaginally this date for manual work - at vaginal opening 3-6 on clock face focus of treatment with gentle stretching in downward direction, perineal body mobility completed in anterior/posterior and in rt/lt directions as well for decreased pain with penetration.  Great  improvement noted, had very minimal soreness per pt 1/10. Pt tolerated well and denied increased pain.  2x10 pelvic floor contractions (4/5 strength most consistently), x10 quick flicks, 4x10s isometrics  2x10 opp hand/knee ball press 2x10 bridges 2x10 squats 10#    PATIENT EDUCATION:  Education details: [id number] Person educated: Patient Education method: Explanation, Demonstration, Tactile cues, Verbal cues, and Handouts Education comprehension: verbalized understanding and returned demonstration  HOME EXERCISE PROGRAM: [id number]    ASSESSMENT:  CLINICAL IMPRESSION: Patient presents for treatment today. Pt session focused on manual work at vaginal opening for decreased pain and improved tissue mobility for tolerance to medical exams and intercourse. Pt tolerated manual work well and continues to show marked improved mobility. Session also progressed with including more strengthening based exercises to improve pelvic floor strength and pelvic stability and pressure management. Pt would benefit from additional PT to continue to progress further toward goals with improved pelvic floor strength and decreased pain with penetration, progress pelvic floor strengthening and coordination with activity/exercise for decreased risk of re-injury to pelvic floor and prolapse and improve pelvic floor strength to improve prolapse as much as possible.   OBJECTIVE IMPAIRMENTS: decreased coordination,  decreased endurance, decreased mobility, decreased strength, increased fascial restrictions, increased muscle spasms, impaired flexibility, improper body mechanics, postural dysfunction, and pain.   ACTIVITY LIMITATIONS: sitting and intercourse, medical assessments  with internal needs  PARTICIPATION LIMITATIONS: interpersonal relationship and community activity  PERSONAL FACTORS: 1 comorbidity: medical history  are also affecting patient's functional outcome.   REHAB POTENTIAL: Good  CLINICAL DECISION MAKING: Stable/uncomplicated  EVALUATION COMPLEXITY: Low   GOALS: Goals reviewed with patient? Yes  SHORT TERM GOALS: Target date: 08/21/22  Pt to be I with advanced HEP.  Baseline: Goal status: MET  2.  Pt will report no more than 5/10 pain due to improvements in posture, strength, and muscle length  Baseline:  Goal status: MET   LONG TERM GOALS: Target date: 10/23/22  Pt to be I with advanced HEP.  Baseline:  Goal status: MET 2.  Pt will report no more than 2/10 pain due to improvements in posture, strength, and muscle length  Baseline:  Goal status: on going  3.  Pt to report improved tolerance to vaginal penetration for improved ability to be able to climax at least 75% of the instance of intercourse.  Baseline:  Goal status: on going  4.  Pt to be I with manual self stretching of perineal body/vaginal opening to improve  tissue mobility and decrease pain at home.  Baseline:  Goal status: MET  5.  Pt to demonstrate at least 4/5 pelvic floor strength with ability to fully relax post contraction for improved pelvic stability and decreased strain at pelvic floor/ decrease leakage.  Baseline:  Goal status: MET  6.  Pt to demonstrate at least 5/5 bil hip strength with good coordination of pelvic floor and breathing  for improved pelvic stability and functional squats without leakage.   Baseline:  Goal status: MET   PLAN:  PT FREQUENCY: 1x/week  PT DURATION:  8  sessions  PLANNED INTERVENTIONS: Therapeutic exercises, Therapeutic activity, Neuromuscular re-education, Balance training, Gait training, Patient/Family education, Self Care, Aquatic Therapy, Dry Needling, Spinal mobilization, Cryotherapy, Moist heat, scar mobilization, Taping, Biofeedback, and Manual therapy  PLAN FOR NEXT SESSION: manual work at perineal mobility, scar mobility, core and hip strengthening, coordination of pelvic floor and breathing mechanics   Otelia Sergeant, PT, DPT 01/16/254:12 PM

## 2023-05-06 ENCOUNTER — Ambulatory Visit: Payer: BC Managed Care – PPO | Admitting: Physical Therapy

## 2023-05-06 DIAGNOSIS — M62838 Other muscle spasm: Secondary | ICD-10-CM

## 2023-05-06 DIAGNOSIS — M6281 Muscle weakness (generalized): Secondary | ICD-10-CM | POA: Diagnosis not present

## 2023-05-06 DIAGNOSIS — R279 Unspecified lack of coordination: Secondary | ICD-10-CM

## 2023-05-06 NOTE — Therapy (Addendum)
OUTPATIENT PHYSICAL THERAPY FEMALE PELVIC TREATMENT   Patient Name: Sandy Goodwin MRN: [id number] DOB:02/18/1971, 53 y.o., female Today's Date: 05/06/2023   END OF SESSION:  PT End of Session - 05/06/23 1840     Visit Number 20    Date for PT Re-Evaluation 08/04/23    Authorization Type [employer]    PT Start Time 1445    PT Stop Time 1530    PT Time Calculation (min) 45 min    Activity Tolerance Patient tolerated treatment well    Behavior During Therapy [facility] for tasks assessed/performed                 Past Medical History:  Diagnosis Date   Full incontinence-feces    History of anemia    History of cancer chemotherapy    right breast;   neo-adjuvent 07-24-2020  to 11-15-2020  and 12-05-2020  to 07-24-2021   History of external beam radiation therapy    right reast 02-04-2021  to 03-25-2021   History of gastric ulcer    remote hx -- resolved   Hypothyroidism, postsurgical    followed by pcp;     02/ 2012  s/p  right hemithyroidectomy  (follicular adenoma)   Malignant neoplasm of upper-outer quadrant of right breast in female, estrogen receptor positive (HCC) 06/2020   oncologist--- dr Pamelia Hoit;  DCIS high grade right breast , neo-adjuvant chemo completed 11-15-2020,  s/p right breast lumpectomy w/ node dissection on 12-11-2020, then chemo completed 07-24-2021 and radiation completed 03-25-2021   Polyarthralgia    PONV (postoperative nausea and vomiting)    With first surgery, none since   Prolapse of vaginal vault after hysterectomy    and posterior prolapse   RA (rheumatoid arthritis) (HCC)    rheumatologsit--- dr Dierdre Forth;  no treatment   Tachycardia    Tachycardia   Past Surgical History:  Procedure Laterality Date   ANTERIOR AND POSTERIOR REPAIR WITH SACROSPINOUS FIXATION N/A 01/26/2022   Procedure: POSTERIOR REPAIR WITH SACROSPINOUS FIXATION;  Surgeon: Marguerita Beards, MD;  Location: [facility];  Service: Gynecology;  Laterality: N/A;  total  time requested is 2 hours   APPENDECTOMY  08/2006   exploratory laparatomy w/ appendectomy (ruptured) and repair bowel   BREAST BIOPSY Right 07/30/2022   MM RT BREAST BX W LOC DEV 1ST LESION IMAGE BX SPEC STEREO GUIDE 07/30/2022 [facility]   BREAST LUMPECTOMY WITH RADIOACTIVE SEED AND SENTINEL LYMPH NODE BIOPSY Right 12/11/2020   Procedure: RIGHT BREAST LUMPECTOMY WITH RADIOACTIVE SEED X2 AND RIGHT AXILLARY SENTINEL LYMPH NODE BIOPSY;  Surgeon: Emelia Loron, MD;  Location: [facility];  Service: General;  Laterality: Right;   BREAST RECONSTRUCTION Right 12/20/2020   Procedure: RIGHT ONCOPLASTIC RECONSTRUCTION;  Surgeon: Glenna Fellows, MD;  Location: [facility];  Service: Plastics;  Laterality: Right;   BREAST REDUCTION SURGERY Left 12/20/2020   Procedure: LEFT BREAST REDUCTION;  Surgeon: Glenna Fellows, MD;  Location: [facility];  Service: Plastics;  Laterality: Left;   COLONOSCOPY N/A 06/15/2016   Procedure: COLONOSCOPY;  Surgeon: Malissa Hippo, MD;  [facility];  Service: Endoscopy;  Laterality: N/A;  845   DILATION AND CURETTAGE OF UTERUS  06/03/2011   w/ suction for miscarriage   EXCISION OF BREAST BIOPSY Right 1999   INCISION AND DRAINAGE OF WOUND Right 01/10/2021   Procedure: INCISION AND DRAINAGE OF RIGHT AXILLA;  Surgeon: Glenna Fellows, MD;  Location: [facility];  Service: Plastics;  Laterality: Right;   OVUM / OOCYTE RETRIEVAL     2010  and 2012   PERINEOPLASTY N/A 01/26/2022   Procedure: PERINEORRHAPY;  Surgeon: Marguerita Beards, MD;  Location: [facility];  Service: Gynecology;  Laterality: N/A;   PERINEOPLASTY N/A 02/19/2022   Procedure: PERINEOPLASTY revision;  Surgeon: Marguerita Beards, MD;  Location: [facility];  Service: Gynecology;  Laterality: N/A;   PORTACATH PLACEMENT Left 07/23/2020   Procedure: INSERTION PORT-A-CATH;  Surgeon:  Emelia Loron, MD;  Location: [facility];  Service: General;  Laterality: Left;   THYROID LOBECTOMY Right 05/2010   @[facility]   VAGINAL HYSTERECTOMY  03/21/2019   @ [facility] by dr Mora Appl;   w/ Rogelio Seen culdoplasty   Patient Active Problem List   Diagnosis Date Noted   Osteopenia 12/31/2022   Prolapse of posterior vaginal wall 01/26/2022   Uterine prolapse 03/20/2021   Polyarthralgia 03/20/2021   Port-A-Cath in place 02/06/2021   Genetic testing 07/17/2020   Malignant neoplasm of upper-outer quadrant of right breast in female, estrogen receptor positive (HCC) 07/12/2020   Change in bowel habits 06/12/2016   Mixed hyperlipidemia 01/07/2016   Hypothyroidism, unspecified 01/07/2016   Allergic rhinitis 04/02/2015   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    PCP: Richardean Chimera, MD  REFERRING PROVIDER: Selmer Dominion, NP  REFERRING DIAG: [id number] (ICD-10-CM) - Post-operative state  THERAPY DIAG:  Muscle weakness (generalized)  Unspecified lack of coordination  Other muscle spasm  Rationale for Evaluation and Treatment: Rehabilitation  ONSET DATE: noticed at last pelvic exam in [place]  SUBJECTIVE:                                                                                                                                                                                           SUBJECTIVE STATEMENT: Pain with penetration 1-2/10 maintains and not resolved. "It is markedly improved". No longer than needs wand prior to intercourse Prolapse symptoms worse after being on feet a lot (more than usual), relief positions do help.  Does have urinary incontinence after voiding and stands and does have some dripping and even enough to wet underwear and unable to stop it.   PAIN:  Are you having pain? No - at this time.  1/10 at area at vaginal opening   PRECAUTIONS: Other: breast CA - not currently getting treatments   WEIGHT BEARING RESTRICTIONS:  No  FALLS:  Has patient fallen in last 6 months? No  LIVING ENVIRONMENT: Lives with: lives with their family   OCCUPATION: PA  PLOF: Independent  PATIENT GOALS: to have no pain with sex  PERTINENT HISTORY:  Malignant neoplasm of upper-outer quadrant of right breast in female, estrogen receptor positive [facility])  Surgery: s/p Posterior repair, sacrospinous ligament fixation, perineorrhaphy, anal sphincteroplasty on 01/26/22 S/p EUA and revision perineoplasty on 02/21/22.  Sexual abuse: No  BOWEL MOVEMENT: Pain with bowel movement: No Type of bowel movement: normal for pt Fully empty rectum: Yes:   Leakage: No Pads: No Fiber supplement: No  URINATION: Pain with urination: No Fully empty bladder: Yes:   Stream: Strong Urgency: No Frequency: not quicker than every 2 hours Leakage:  none Pads: No  INTERCOURSE: Pain with intercourse: Initial Penetration and During Penetration Ability to have vaginal penetration:  Yes: but painful Climax: not really due to pain Marinoff Scale: 2/3  PREGNANCY: Vaginal deliveries 2 Tearing Yes: 3rd deg tear posterior with first and anterior second  C-section deliveries 0 Currently pregnant No  PROLAPSE: None   OBJECTIVE:   DIAGNOSTIC FINDINGS:    COGNITION: Overall cognitive status: Within functional limits for tasks assessed     SENSATION: Light touch: Appears intact Proprioception: Appears intact  MUSCLE LENGTH: Hamstrings and adductors limited by 25%    POSTURE: rounded shoulders, forward head, and flexed trunk   LUMBARAROM/PROM:  A/PROM A/PROM  eval  Flexion Limited by 25%  Extension WFL  Right lateral flexion WFL  Left lateral flexion WFL  Right rotation WFL  Left rotation WFL   (Blank rows = not tested)  LOWER EXTREMITY ROM:  WFL  LOWER EXTREMITY MMT:  Hip abduction 3+/5 bil, all other 4/5, knees 5/5 PALPATION:   General  no TTP externally                 External Perineal Exam mild TTP at  posterior vaginal opening externally 10/08/22 - TTP at bil labia minora however reports she needs to add her cream externally to help with tissue mobility and moisture.                              Internal Pelvic Floor bil bulbocavernosus, at vaginal opening at clock face 8-4 locations, no TTP at deep layers except near coccyx. perineal scar tissue restriction/tenderness,  10/08/22 - Lt bulbocavernosus, tension most felt at 4-5 on clock face of vaginal opening, also TTP throughout Lt pubococcygeus possible puborectalis with trigger points noted. Improving perineal scar mobility with mild restrictions now.   Patient confirms identification and approves PT to assess internal pelvic floor and treatment Yes  PELVIC MMT:   MMT eval 10/08/22  11/26/22  04/29/23   Vaginal 3/5, 8s, 10 reps 3/5, 10s, 10 reps - however focus of training has been improved pain management with decreased tension 4/5 5reps 8s 4/5, 10s, 7 reps  Internal Anal Sphincter      External Anal Sphincter      Puborectalis      Diastasis Recti      (Blank rows = not tested)        TONE: Pelvic floor atrophy   PROLAPSE: Not seen in hooklying  11/12/22 - anterior wall laxity with cough in hooklying and standing.   TODAY'S TREATMENT:  DATE: 02/18/23: Patient consented to internal pelvic floor treatment vaginally this date for manual work - at vaginal opening 3-6 on clock face focus of treatment with gentle stretching in downward direction, perineal body mobility completed in anterior/posterior and in rt/lt directions as well for decreased pain with penetration.  Great  improvement noted, had very minimal soreness per pt 1/10. Pt tolerated well and denied increased pain.  2x10 pelvic floor contractions (3/5 strength most consistently) Pt educated on possible benefit of vaginal weights for strengthening but  encouraged to ask MD about this at tomorrows appointment as well 2x10 bridges 10# 2x10 sidelying hip abduction with ball press 3# Squats 10# 2x10 [employer] 15# Standing alt marching 5# x20   04/29/23: Patient consented to internal pelvic floor treatment vaginally this date for manual work - at vaginal opening 3-6 on clock face focus of treatment with gentle stretching in downward direction, perineal body mobility completed in anterior/posterior and in rt/lt directions as well for decreased pain with penetration.  Great  improvement noted, had very minimal soreness per pt 1/10. Pt tolerated well and denied increased pain.  2x10 pelvic floor contractions (4/5 strength most consistently), x10 quick flicks, 4x10s isometrics  2x10 opp hand/knee ball press 2x10 bridges 2x10 squats 10#    05/06/23  Patient consented to internal pelvic floor treatment vaginally this date for manual work - at vaginal opening Rt side focus of treatment with gentle stretching and perineal body mobility completed in anterior/posterior for decreased pain with penetration.  Continued   improvement noted. Pt tolerated well and denied increased pain.  2x10 mario punches 5#, 6# Mini lunges x10 each Bridges 2x10 3x10 opp hand/knee ball press   PATIENT EDUCATION:  Education details: [id number] Person educated: Patient Education method: Explanation, Demonstration, Tactile cues, Verbal cues, and Handouts Education comprehension: verbalized understanding and returned demonstration  HOME EXERCISE PROGRAM: [id number]    ASSESSMENT:  CLINICAL IMPRESSION: Patient presents for treatment today. Pt session focused on manual work at vaginal opening for decreased pain and improved tissue mobility for tolerance to medical exams and intercourse. Pt tolerated manual work well and continues to show marked improved mobility. Session also progressed with including more strengthening based exercises to improve pelvic floor strength and  pelvic stability and pressure management. Pt did report soreness in legs after last session and did do a little less standing LE strengthening with less weighted resistance. Pt denied pain at end of session. Pt would benefit from additional PT to continue to progress further toward goals with improved pelvic floor strength and decreased pain with penetration, progress pelvic floor strengthening and coordination with activity/exercise for decreased risk of re-injury to pelvic floor and prolapse and improve pelvic floor strength to improve prolapse as much as possible.   OBJECTIVE IMPAIRMENTS: decreased coordination, decreased endurance, decreased mobility, decreased strength, increased fascial restrictions, increased muscle spasms, impaired flexibility, improper body mechanics, postural dysfunction, and pain.   ACTIVITY LIMITATIONS: sitting and intercourse, medical assessments  with internal needs  PARTICIPATION LIMITATIONS: interpersonal relationship and community activity  PERSONAL FACTORS: 1 comorbidity: medical history  are also affecting patient's functional outcome.   REHAB POTENTIAL: Good  CLINICAL DECISION MAKING: Stable/uncomplicated  EVALUATION COMPLEXITY: Low   GOALS: Goals reviewed with patient? Yes  SHORT TERM GOALS: Target date: 08/21/22  Pt to be I with advanced HEP.  Baseline: Goal status: MET  2.  Pt will report no more than 5/10 pain due to improvements in posture, strength, and muscle length  Baseline:  Goal status: MET  LONG TERM GOALS: Target date: 10/23/22  Pt to be I with advanced HEP.  Baseline:  Goal status: MET 2.  Pt will report no more than 2/10 pain due to improvements in posture, strength, and muscle length  Baseline:  Goal status: on going  3.  Pt to report improved tolerance to vaginal penetration for improved ability to be able to climax at least 75% of the instance of intercourse.  Baseline:  Goal status: on going  4.  Pt to be I with manual  self stretching of perineal body/vaginal opening to improve tissue mobility and decrease pain at home.  Baseline:  Goal status: MET  5.  Pt to demonstrate at least 4/5 pelvic floor strength with ability to fully relax post contraction for improved pelvic stability and decreased strain at pelvic floor/ decrease leakage.  Baseline:  Goal status: MET  6.  Pt to demonstrate at least 5/5 bil hip strength with good coordination of pelvic floor and breathing  for improved pelvic stability and functional squats without leakage.   Baseline:  Goal status: MET  7.  Pt to demonstrate pelvic floor contraction of at least 4/5 strength for 10s and be able to withhold during cough in standing to decreased strain at pelvic floor. Baseline:  Goal status: MET  8. Pt to demonstrate improved coordination of pelvic floor and breathing mechanics with 10# squat with appropriate synergistic patterns to decrease pain and leakage at least 75% of the time.      Baseline:  Goal status: MET  PT FREQUENCY: 1-2x/week  PT DURATION:  10 sessions  PLANNED INTERVENTIONS: Therapeutic exercises, Therapeutic activity, Neuromuscular re-education, Balance training, Gait training, Patient/Family education, Self Care, Aquatic Therapy, Dry Needling, Spinal mobilization, Cryotherapy, Moist heat, scar mobilization, Taping, Biofeedback, and Manual therapy  PLAN FOR NEXT SESSION: manual work at perineal mobility, core and hip strengthening, coordination of pelvic floor and breathing mechanics   Otelia Sergeant, PT, DPT 01/23/256:54 PM

## 2023-05-06 NOTE — Addendum Note (Signed)
Addended by: Barbaraann Faster on: 05/06/2023 06:54 PM   Modules accepted: Orders

## 2023-05-13 ENCOUNTER — Ambulatory Visit: Payer: BC Managed Care – PPO | Admitting: Physical Therapy

## 2023-05-13 ENCOUNTER — Encounter: Payer: Self-pay | Admitting: Obstetrics and Gynecology

## 2023-05-13 DIAGNOSIS — M62838 Other muscle spasm: Secondary | ICD-10-CM

## 2023-05-13 DIAGNOSIS — M6281 Muscle weakness (generalized): Secondary | ICD-10-CM

## 2023-05-13 NOTE — Therapy (Signed)
OUTPATIENT PHYSICAL THERAPY FEMALE PELVIC TREATMENT   Patient Name: Sandy Goodwin MRN: [id number] DOB:[birth year], 53 y.o., female Today's Date: 05/13/2023   END OF SESSION:  PT End of Session - 05/13/23 1528     Visit Number 21    Date for PT Re-Evaluation 08/04/23    Authorization Type BC[employer]    PT Start Time 1445    PT Stop Time 1527    PT Time Calculation (min) 42 min    Activity Tolerance Patient tolerated treatment well    Behavior During Therapy [facility] for tasks assessed/performed                  Past Medical History:  Diagnosis Date   Full incontinence-feces    History of anemia    History of cancer chemotherapy    right breast;   neo-adjuvent 07-24-2020  to 11-15-2020  and 12-05-2020  to 07-24-2021   History of external beam radiation therapy    right reast 02-04-2021  to 03-25-2021   History of gastric ulcer    remote hx -- resolved   Hypothyroidism, postsurgical    followed by pcp;     02/ 2012  s/p  right hemithyroidectomy  (follicular adenoma)   Malignant neoplasm of upper-outer quadrant of right breast in female, estrogen receptor positive (HCC) 06/2020   oncologist--- dr Pamelia Hoit;  DCIS high grade right breast , neo-adjuvant chemo completed 11-15-2020,  s/p right breast lumpectomy w/ node dissection on 12-11-2020, then chemo completed 07-24-2021 and radiation completed 03-25-2021   Polyarthralgia    PONV (postoperative nausea and vomiting)    With first surgery, none since   Prolapse of vaginal vault after hysterectomy    and posterior prolapse   RA (rheumatoid arthritis) (HCC)    rheumatologsit--- dr Dierdre Forth;  no treatment   Tachycardia    Tachycardia   Past Surgical History:  Procedure Laterality Date   ANTERIOR AND POSTERIOR REPAIR WITH SACROSPINOUS FIXATION N/A 01/26/2022   Procedure: POSTERIOR REPAIR WITH SACROSPINOUS FIXATION;  Surgeon: Marguerita Beards, MD;  Location: [facility] [place];  Service: Gynecology;  Laterality: N/A;  total  time requested is 2 hours   APPENDECTOMY  08/2006   exploratory laparatomy w/ appendectomy (ruptured) and repair bowel   BREAST BIOPSY Right 07/30/2022   MM RT BREAST BX W LOC DEV 1ST LESION IMAGE BX SPEC STEREO GUIDE 07/30/2022 [facility]   BREAST LUMPECTOMY WITH RADIOACTIVE SEED AND SENTINEL LYMPH NODE BIOPSY Right 12/11/2020   Procedure: RIGHT BREAST LUMPECTOMY WITH RADIOACTIVE SEED X2 AND RIGHT AXILLARY SENTINEL LYMPH NODE BIOPSY;  Surgeon: Emelia Loron, MD;  Location: [facility];  Service: General;  Laterality: Right;   BREAST RECONSTRUCTION Right 12/20/2020   Procedure: RIGHT ONCOPLASTIC RECONSTRUCTION;  Surgeon: Glenna Fellows, MD;  Location: [facility];  Service: Plastics;  Laterality: Right;   BREAST REDUCTION SURGERY Left 12/20/2020   Procedure: LEFT BREAST REDUCTION;  Surgeon: Glenna Fellows, MD;  Location: [facility];  Service: Plastics;  Laterality: Left;   COLONOSCOPY N/A 06/15/2016   Procedure: COLONOSCOPY;  Surgeon: Malissa Hippo, MD;  [facility];  Service: Endoscopy;  Laterality: N/A;  845   DILATION AND CURETTAGE OF UTERUS  06/03/2011   w/ suction for miscarriage   EXCISION OF BREAST BIOPSY Right 1999   INCISION AND DRAINAGE OF WOUND Right 01/10/2021   Procedure: INCISION AND DRAINAGE OF RIGHT AXILLA;  Surgeon: Glenna Fellows, MD;  Location: [facility];  Service: Plastics;  Laterality: Right;   OVUM / OOCYTE RETRIEVAL     2010  and 2012   PERINEOPLASTY N/A 01/26/2022   Procedure: PERINEORRHAPY;  Surgeon: Marguerita Beards, MD;  Location: [facility];  Service: Gynecology;  Laterality: N/A;   PERINEOPLASTY N/A 02/19/2022   Procedure: PERINEOPLASTY revision;  Surgeon: Marguerita Beards, MD;  Location: [facility];  Service: Gynecology;  Laterality: N/A;   PORTACATH PLACEMENT Left 07/23/2020   Procedure: INSERTION PORT-A-CATH;  Surgeon:  Emelia Loron, MD;  Location: [facility];  Service: General;  Laterality: Left;   THYROID LOBECTOMY Right 05/2010   @[facility]   VAGINAL HYSTERECTOMY  03/21/2019   @ [facility] by dr Mora Appl;   w/ Rogelio Seen culdoplasty   Patient Active Problem List   Diagnosis Date Noted   Osteopenia 12/31/2022   Prolapse of posterior vaginal wall 01/26/2022   Uterine prolapse 03/20/2021   Polyarthralgia 03/20/2021   Port-A-Cath in place 02/06/2021   Genetic testing 07/17/2020   Malignant neoplasm of upper-outer quadrant of right breast in female, estrogen receptor positive (HCC) 07/12/2020   Change in bowel habits 06/12/2016   Mixed hyperlipidemia 01/07/2016   Hypothyroidism, unspecified 01/07/2016   Allergic rhinitis 04/02/2015   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    PCP: Richardean Chimera, MD  REFERRING PROVIDER: Selmer Dominion, NP  REFERRING DIAG: [phone] (ICD-10-CM) - Post-operative state  THERAPY DIAG:  Muscle weakness (generalized)  Other muscle spasm  Rationale for Evaluation and Treatment: Rehabilitation  ONSET DATE: noticed at last pelvic exam in [place]  SUBJECTIVE:                                                                                                                                                                                           SUBJECTIVE STATEMENT: Pain with penetration 2/10 maintains and not as bad as it was, still doesn't have to use the wand prior to intercourse and now still a little painful than right after injections.   PAIN:  Are you having pain? No - at this time.  2/10 at area at vaginal opening   PRECAUTIONS: Other: breast CA - not currently getting treatments   WEIGHT BEARING RESTRICTIONS: No  FALLS:  Has patient fallen in last 6 months? No  LIVING ENVIRONMENT: Lives with: lives with their family   OCCUPATION: PA  PLOF: Independent  PATIENT GOALS: to have no pain with sex  PERTINENT HISTORY:   Malignant  neoplasm of upper-outer quadrant of right breast in female, estrogen receptor positive (HCC)  Surgery: s/p Posterior repair, sacrospinous ligament fixation, perineorrhaphy, anal sphincteroplasty on 01/26/22 S/p EUA and revision perineoplasty on  02/21/22.  Sexual abuse: No  BOWEL MOVEMENT: Pain with bowel movement: No Type of bowel movement: normal for pt Fully empty rectum: Yes:   Leakage: No Pads: No Fiber supplement: No  URINATION: Pain with urination: No Fully empty bladder: Yes:   Stream: Strong Urgency: No Frequency: not quicker than every 2 hours Leakage:  none Pads: No  INTERCOURSE: Pain with intercourse: Initial Penetration and During Penetration Ability to have vaginal penetration:  Yes: but painful Climax: not really due to pain Marinoff Scale: 2/3  PREGNANCY: Vaginal deliveries 2 Tearing Yes: 3rd deg tear posterior with first and anterior second  C-section deliveries 0 Currently pregnant No  PROLAPSE: None   OBJECTIVE:   DIAGNOSTIC FINDINGS:    COGNITION: Overall cognitive status: Within functional limits for tasks assessed     SENSATION: Light touch: Appears intact Proprioception: Appears intact  MUSCLE LENGTH: Hamstrings and adductors limited by 25%    POSTURE: rounded shoulders, forward head, and flexed trunk   LUMBARAROM/PROM:  A/PROM A/PROM  eval  Flexion Limited by 25%  Extension WFL  Right lateral flexion WFL  Left lateral flexion WFL  Right rotation WFL  Left rotation WFL   (Blank rows = not tested)  LOWER EXTREMITY ROM:  WFL  LOWER EXTREMITY MMT:  Hip abduction 3+/5 bil, all other 4/5, knees 5/5 PALPATION:   General  no TTP externally                 External Perineal Exam mild TTP at posterior vaginal opening externally 10/08/22 - TTP at bil labia minora however reports she needs to add her cream externally to help with tissue mobility and moisture.                              Internal Pelvic Floor bil  bulbocavernosus, at vaginal opening at clock face 8-4 locations, no TTP at deep layers except near coccyx. perineal scar tissue restriction/tenderness,  10/08/22 - Lt bulbocavernosus, tension most felt at 4-5 on clock face of vaginal opening, also TTP throughout Lt pubococcygeus possible puborectalis with trigger points noted. Improving perineal scar mobility with mild restrictions now.   Patient confirms identification and approves PT to assess internal pelvic floor and treatment Yes  PELVIC MMT:   MMT eval 10/08/22  11/26/22  04/29/23   Vaginal 3/5, 8s, 10 reps 3/5, 10s, 10 reps - however focus of training has been improved pain management with decreased tension 4/5 5reps 8s 4/5, 10s, 7 reps  Internal Anal Sphincter      External Anal Sphincter      Puborectalis      Diastasis Recti      (Blank rows = not tested)        TONE: Pelvic floor atrophy   PROLAPSE: Not seen in hooklying  11/12/22 - anterior wall laxity with cough in hooklying and standing.   TODAY'S TREATMENT:  DATE: 02/18/23: Patient consented to internal pelvic floor treatment vaginally this date for manual work - at vaginal opening 3-6 on clock face focus of treatment with gentle stretching in downward direction, perineal body mobility completed in anterior/posterior and in rt/lt directions as well for decreased pain with penetration.  Great  improvement noted, had very minimal soreness per pt 1/10. Pt tolerated well and denied increased pain.  2x10 pelvic floor contractions (3/5 strength most consistently) Pt educated on possible benefit of vaginal weights for strengthening but encouraged to ask MD about this at tomorrows appointment as well 2x10 bridges 10# 2x10 sidelying hip abduction with ball press 3# Squats 10# 2x10 Farmers 15# Standing alt marching 5# x20   04/29/23: Patient consented to  internal pelvic floor treatment vaginally this date for manual work - at vaginal opening 3-6 on clock face focus of treatment with gentle stretching in downward direction, perineal body mobility completed in anterior/posterior and in rt/lt directions as well for decreased pain with penetration.  Great  improvement noted, had very minimal soreness per pt 1/10. Pt tolerated well and denied increased pain.  2x10 pelvic floor contractions (4/5 strength most consistently), x10 quick flicks, 4x10s isometrics  2x10 opp hand/knee ball press 2x10 bridges 2x10 squats 10#    05/06/23  Patient consented to internal pelvic floor treatment vaginally this date for manual work - at vaginal opening Rt side focus of treatment with gentle stretching and perineal body mobility completed in anterior/posterior for decreased pain with penetration.  Continued   improvement noted. Pt tolerated well and denied increased pain.  2x10 mario punches 5#, 6# Mini lunges x10 each Bridges 2x10 3x10 opp hand/knee ball press  05/06/23:  Patient consented to internal pelvic floor treatment vaginally this date for manual work - at vaginal opening Rt lateral side focus of treatment with gentle stretching for decreased pain with penetration.  Continued   improvement noted. Pt did have muscle twitching noted here and sharp pain reported with this stretching, less stretch provided with better tolerance and less pain. Mild release noted but not resolved. Did discuss with pt possibly returning to MD for a few additional trigger point injects to complete mobility and decreased pain here. She agreed.   X10 15# squats  10# farmers carry 400' both hands   PATIENT EDUCATION:  Education details: [id number] Person educated: Patient Education method: Explanation, Demonstration, Tactile cues, Verbal cues, and Handouts Education comprehension: verbalized understanding and returned demonstration  HOME EXERCISE PROGRAM: [id number]     ASSESSMENT:  CLINICAL IMPRESSION: Patient presents for treatment today. Pt session focused on manual work at vaginal opening for decreased pain and improved tissue mobility for tolerance to medical exams and intercourse. Pt tolerated manual work well . Session also progressed with including more strengthening based exercises to improve pelvic floor strength and pelvic stability and pressure management. Pt denied pain at end of session. Pt would benefit from additional PT to continue to progress further toward goals with improved pelvic floor strength and decreased pain with penetration, progress pelvic floor strengthening and coordination with activity/exercise for decreased risk of re-injury to pelvic floor and prolapse and improve pelvic floor strength to improve prolapse as much as possible.   OBJECTIVE IMPAIRMENTS: decreased coordination, decreased endurance, decreased mobility, decreased strength, increased fascial restrictions, increased muscle spasms, impaired flexibility, improper body mechanics, postural dysfunction, and pain.   ACTIVITY LIMITATIONS: sitting and intercourse, medical assessments  with internal needs  PARTICIPATION LIMITATIONS: interpersonal relationship and community activity  PERSONAL FACTORS: 1 comorbidity:  medical history  are also affecting patient's functional outcome.   REHAB POTENTIAL: Good  CLINICAL DECISION MAKING: Stable/uncomplicated  EVALUATION COMPLEXITY: Low   GOALS: Goals reviewed with patient? Yes  SHORT TERM GOALS: Target date: 08/21/22  Pt to be I with advanced HEP.  Baseline: Goal status: MET  2.  Pt will report no more than 5/10 pain due to improvements in posture, strength, and muscle length  Baseline:  Goal status: MET   LONG TERM GOALS: Target date: 10/23/22  Pt to be I with advanced HEP.  Baseline:  Goal status: MET 2.  Pt will report no more than 2/10 pain due to improvements in posture, strength, and muscle length  Baseline:   Goal status: on going  3.  Pt to report improved tolerance to vaginal penetration for improved ability to be able to climax at least 75% of the instance of intercourse.  Baseline:  Goal status: on going  4.  Pt to be I with manual self stretching of perineal body/vaginal opening to improve tissue mobility and decrease pain at home.  Baseline:  Goal status: MET  5.  Pt to demonstrate at least 4/5 pelvic floor strength with ability to fully relax post contraction for improved pelvic stability and decreased strain at pelvic floor/ decrease leakage.  Baseline:  Goal status: MET  6.  Pt to demonstrate at least 5/5 bil hip strength with good coordination of pelvic floor and breathing  for improved pelvic stability and functional squats without leakage.   Baseline:  Goal status: MET  7.  Pt to demonstrate pelvic floor contraction of at least 4/5 strength for 10s and be able to withhold during cough in standing to decreased strain at pelvic floor. Baseline:  Goal status: MET  8. Pt to demonstrate improved coordination of pelvic floor and breathing mechanics with 10# squat with appropriate synergistic patterns to decrease pain and leakage at least 75% of the time.      Baseline:  Goal status: MET  PT FREQUENCY: 1-2x/week  PT DURATION:  10 sessions  PLANNED INTERVENTIONS: Therapeutic exercises, Therapeutic activity, Neuromuscular re-education, Balance training, Gait training, Patient/Family education, Self Care, Aquatic Therapy, Dry Needling, Spinal mobilization, Cryotherapy, Moist heat, scar mobilization, Taping, Biofeedback, and Manual therapy  PLAN FOR NEXT SESSION: manual work at perineal mobility, core and hip strengthening, coordination of pelvic floor and breathing mechanics   Otelia Sergeant, PT, DPT 01/30/253:55 PM

## 2023-05-17 ENCOUNTER — Encounter: Payer: Self-pay | Admitting: Physical Therapy

## 2023-05-20 ENCOUNTER — Ambulatory Visit: Payer: Self-pay | Attending: General Surgery | Admitting: Physical Therapy

## 2023-05-20 ENCOUNTER — Encounter: Payer: Self-pay | Admitting: Physical Therapy

## 2023-05-20 DIAGNOSIS — M6281 Muscle weakness (generalized): Secondary | ICD-10-CM | POA: Insufficient documentation

## 2023-05-20 DIAGNOSIS — M62838 Other muscle spasm: Secondary | ICD-10-CM | POA: Insufficient documentation

## 2023-05-20 NOTE — Therapy (Signed)
 OUTPATIENT PHYSICAL THERAPY FEMALE PELVIC TREATMENT   Patient Name: Sandy Goodwin MRN: [id number] DOB:July 25, 1970, 53 y.o., female Today's Date: 05/20/2023   END OF SESSION:  PT End of Session - 05/20/23 1438     Visit Number 22    Date for PT Re-Evaluation 08/04/23    Authorization Type [employer]    PT Start Time 1400    PT Stop Time 1438    PT Time Calculation (min) 38 min    Activity Tolerance Patient tolerated treatment well    Behavior During Therapy [facility] for tasks assessed/performed                  Past Medical History:  Diagnosis Date   Full incontinence-feces    History of anemia    History of cancer chemotherapy    right breast;   neo-adjuvent 07-24-2020  to 11-15-2020  and 12-05-2020  to 07-24-2021   History of external beam radiation therapy    right reast 02-04-2021  to 03-25-2021   History of gastric ulcer    remote hx -- resolved   Hypothyroidism, postsurgical    followed by pcp;     02/ 2012  s/p  right hemithyroidectomy  (follicular adenoma)   Malignant neoplasm of upper-outer quadrant of right breast in female, estrogen receptor positive (HCC) 06/2020   oncologist--- dr odean;  DCIS high grade right breast , neo-adjuvant chemo completed 11-15-2020,  s/p right breast lumpectomy w/ node dissection on 12-11-2020, then chemo completed 07-24-2021 and radiation completed 03-25-2021   Polyarthralgia    PONV (postoperative nausea and vomiting)    With first surgery, none since   Prolapse of vaginal vault after hysterectomy    and posterior prolapse   RA (rheumatoid arthritis) (HCC)    rheumatologsit--- dr mai;  no treatment   Tachycardia    Tachycardia   Past Surgical History:  Procedure Laterality Date   ANTERIOR AND POSTERIOR REPAIR WITH SACROSPINOUS FIXATION N/A 01/26/2022   Procedure: POSTERIOR REPAIR WITH SACROSPINOUS FIXATION;  Surgeon: Marilynne Rosaline SAILOR, MD;  Location: [facility];  Service: Gynecology;  Laterality: N/A;  total  time requested is 2 hours   APPENDECTOMY  08/2006   exploratory laparatomy w/ appendectomy (ruptured) and repair bowel   BREAST BIOPSY Right 07/30/2022   MM RT BREAST BX W LOC DEV 1ST LESION IMAGE BX SPEC STEREO GUIDE 07/30/2022 [facility]   BREAST LUMPECTOMY WITH RADIOACTIVE SEED AND SENTINEL LYMPH NODE BIOPSY Right 12/11/2020   Procedure: RIGHT BREAST LUMPECTOMY WITH RADIOACTIVE SEED X2 AND RIGHT AXILLARY SENTINEL LYMPH NODE BIOPSY;  Surgeon: Ebbie Cough, MD;  Location: [facility];  Service: General;  Laterality: Right;   BREAST RECONSTRUCTION Right 12/20/2020   Procedure: RIGHT ONCOPLASTIC RECONSTRUCTION;  Surgeon: Arelia Filippo, MD;  Location: [facility];  Service: Plastics;  Laterality: Right;   BREAST REDUCTION SURGERY Left 12/20/2020   Procedure: LEFT BREAST REDUCTION;  Surgeon: Arelia Filippo, MD;  Location: [facility];  Service: Plastics;  Laterality: Left;   COLONOSCOPY N/A 06/15/2016   Procedure: COLONOSCOPY;  Surgeon: Claudis RAYMOND Rivet, MD;  Location: [facility];  Service: Endoscopy;  Laterality: N/A;  845   DILATION AND CURETTAGE OF UTERUS  06/03/2011   w/ suction for miscarriage   EXCISION OF BREAST BIOPSY Right 1999   INCISION AND DRAINAGE OF WOUND Right 01/10/2021   Procedure: INCISION AND DRAINAGE OF RIGHT AXILLA;  Surgeon: Arelia Filippo, MD;  Location: [facility];  Service: Plastics;  Laterality: Right;   OVUM / OOCYTE RETRIEVAL     2010  and 2012   PERINEOPLASTY N/A 01/26/2022   Procedure: PERINEORRHAPY;  Surgeon: Marilynne Rosaline SAILOR, MD;  Location: [facility];  Service: Gynecology;  Laterality: N/A;   PERINEOPLASTY N/A 02/19/2022   Procedure: PERINEOPLASTY revision;  Surgeon: Marilynne Rosaline SAILOR, MD;  Location: [facility];  Service: Gynecology;  Laterality: N/A;   PORTACATH PLACEMENT Left 07/23/2020   Procedure: INSERTION PORT-A-CATH;  Surgeon:  Ebbie Cough, MD;  Location: [facility];  Service: General;  Laterality: Left;   THYROID  LOBECTOMY Right 05/2010   @[facility]   VAGINAL HYSTERECTOMY  03/21/2019   @ [facility] by dr odie;   w/ Jannetta culdoplasty   Patient Active Problem List   Diagnosis Date Noted   Osteopenia 12/31/2022   Prolapse of posterior vaginal wall 01/26/2022   Uterine prolapse 03/20/2021   Polyarthralgia 03/20/2021   Port-A-Cath in place 02/06/2021   Genetic testing 07/17/2020   Malignant neoplasm of upper-outer quadrant of right breast in female, estrogen receptor positive (HCC) 07/12/2020   Change in bowel habits 06/12/2016   Mixed hyperlipidemia 01/07/2016   Hypothyroidism, unspecified 01/07/2016   Allergic rhinitis 04/02/2015   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    PCP: Toribio Jerel MATSU, MD  REFERRING PROVIDER: Zuleta, Kaitlin G, NP  REFERRING DIAG: [phone] (ICD-10-CM) - Post-operative state  THERAPY DIAG:  Other muscle spasm  Muscle weakness (generalized)  Rationale for Evaluation and Treatment: Rehabilitation  ONSET DATE: noticed at last pelvic exam in [place]  SUBJECTIVE:                                                                                                                                                                                           SUBJECTIVE STATEMENT: Pt reports she does still have pain at Rt external and internal area of vaginal opening and does report at end of the day has been feeling prolapse a little more. Does have a few small urinary drops throughout the day but worse at end of day.   PAIN:  Are you having pain? No - at this time.  2/10 at area at vaginal opening   PRECAUTIONS: Other: breast CA - not currently getting treatments   WEIGHT BEARING RESTRICTIONS: No  FALLS:  Has patient fallen in last 6 months? No  LIVING ENVIRONMENT: Lives with: lives with their family   OCCUPATION: PA  PLOF: Independent  PATIENT GOALS:  to have no pain with sex  PERTINENT HISTORY:   Malignant neoplasm of upper-outer quadrant of right breast in female, estrogen receptor positive (HCC)  Surgery: s/p Posterior  repair, sacrospinous ligament fixation, perineorrhaphy, anal sphincteroplasty on 01/26/22 S/p EUA and revision perineoplasty on 02/21/22.  Sexual abuse: No  BOWEL MOVEMENT: Pain with bowel movement: No Type of bowel movement: normal for pt Fully empty rectum: Yes:   Leakage: No Pads: No Fiber supplement: No  URINATION: Pain with urination: No Fully empty bladder: Yes:   Stream: Strong Urgency: No Frequency: not quicker than every 2 hours Leakage:  none Pads: No  INTERCOURSE: Pain with intercourse: Initial Penetration and During Penetration Ability to have vaginal penetration:  Yes: but painful Climax: not really due to pain Marinoff Scale: 2/3  PREGNANCY: Vaginal deliveries 2 Tearing Yes: 3rd deg tear posterior with first and anterior second  C-section deliveries 0 Currently pregnant No  PROLAPSE: None   OBJECTIVE:   DIAGNOSTIC FINDINGS:    COGNITION: Overall cognitive status: Within functional limits for tasks assessed     SENSATION: Light touch: Appears intact Proprioception: Appears intact  MUSCLE LENGTH: Hamstrings and adductors limited by 25%    POSTURE: rounded shoulders, forward head, and flexed trunk   LUMBARAROM/PROM:  A/PROM A/PROM  eval  Flexion Limited by 25%  Extension WFL  Right lateral flexion WFL  Left lateral flexion WFL  Right rotation WFL  Left rotation WFL   (Blank rows = not tested)  LOWER EXTREMITY ROM:  WFL  LOWER EXTREMITY MMT:  Hip abduction 3+/5 bil, all other 4/5, knees 5/5 PALPATION:   General  no TTP externally                 External Perineal Exam mild TTP at posterior vaginal opening externally 10/08/22 - TTP at bil labia minora however reports she needs to add her cream externally to help with tissue mobility and moisture.                               Internal Pelvic Floor bil bulbocavernosus, at vaginal opening at clock face 8-4 locations, no TTP at deep layers except near coccyx. perineal scar tissue restriction/tenderness,  10/08/22 - Lt bulbocavernosus, tension most felt at 4-5 on clock face of vaginal opening, also TTP throughout Lt pubococcygeus possible puborectalis with trigger points noted. Improving perineal scar mobility with mild restrictions now.   Patient confirms identification and approves PT to assess internal pelvic floor and treatment Yes  PELVIC MMT:   MMT eval 10/08/22  11/26/22  04/29/23   Vaginal 3/5, 8s, 10 reps 3/5, 10s, 10 reps - however focus of training has been improved pain management with decreased tension 4/5 5reps 8s 4/5, 10s, 7 reps  Internal Anal Sphincter      External Anal Sphincter      Puborectalis      Diastasis Recti      (Blank rows = not tested)        TONE: Pelvic floor atrophy   PROLAPSE: Not seen in hooklying  11/12/22 - anterior wall laxity with cough in hooklying and standing.   TODAY'S TREATMENT:  DATE: 04/29/23: Patient consented to internal pelvic floor treatment vaginally this date for manual work - at vaginal opening 3-6 on clock face focus of treatment with gentle stretching in downward direction, perineal body mobility completed in anterior/posterior and in rt/lt directions as well for decreased pain with penetration.  Great  improvement noted, had very minimal soreness per pt 1/10. Pt tolerated well and denied increased pain.  2x10 pelvic floor contractions (4/5 strength most consistently), x10 quick flicks, 4x10s isometrics  2x10 opp hand/knee ball press 2x10 bridges 2x10 squats 10#    05/06/23  Patient consented to internal pelvic floor treatment vaginally this date for manual work - at vaginal opening Rt side focus of treatment  with gentle stretching and perineal body mobility completed in anterior/posterior for decreased pain with penetration.  Continued   improvement noted. Pt tolerated well and denied increased pain.  2x10 mario punches 5#, 6# Mini lunges x10 each Bridges 2x10 3x10 opp hand/knee ball press  05/06/23:  Patient consented to internal pelvic floor treatment vaginally this date for manual work - at vaginal opening Rt lateral side focus of treatment with gentle stretching for decreased pain with penetration.  Continued   improvement noted. Pt did have muscle twitching noted here and sharp pain reported with this stretching, less stretch provided with better tolerance and less pain. Mild release noted but not resolved. Did discuss with pt possibly returning to MD for a few additional trigger point injects to complete mobility and decreased pain here. She agreed.   X10 15# squats  10# farmers carry 400' both hands  05/20/23  Patient consented to internal pelvic floor treatment vaginally this date for manual work - at vaginal opening Rt lateral side focus of treatment with gentle stretching for decreased pain with penetration.  Continued   improvement noted. Pt did have muscle twitching noted here and achy pain reported with stretching but tolerated better than last session. Mild release noted but not resolved. Did have more external TTP at Rt bulbocavernosus just outside of vaginal opening and pt reports this feels like it is radiating kind of wrapping into vagina. And with location pt pointed to does demonstrate internally at bulbocavernosus area. Manual with gentle stretching and soft tissue release work complete here as well.    PATIENT EDUCATION:  Education details: [id number] Person educated: Patient Education method: Explanation, Demonstration, Actor cues, Verbal cues, and Handouts Education comprehension: verbalized understanding and returned demonstration  HOME EXERCISE PROGRAM: [id number]     ASSESSMENT:  CLINICAL IMPRESSION: Patient presents for treatment today. Pt session focused on manual work at vaginal opening for decreased pain and improved tissue mobility for tolerance to medical exams and intercourse. Pt tolerated manual work well . Session focused on manual this date as pt reports she feels like she sometimes grips a lot throughout the day because of the little bit of leakage and thinks this is tightening at Rt side. Pt would benefit from additional PT to continue to progress further toward goals with improved pelvic floor strength and decreased pain with penetration, progress pelvic floor strengthening and coordination with activity/exercise for decreased risk of re-injury to pelvic floor and prolapse and improve pelvic floor strength to improve prolapse as much as possible.  Pt does have appt with Dr. Marilynne tomorrow for trigger point injections and plans to discuss pain, leakage and possible pessary use.   OBJECTIVE IMPAIRMENTS: decreased coordination, decreased endurance, decreased mobility, decreased strength, increased fascial restrictions, increased muscle spasms, impaired flexibility, improper body mechanics, postural dysfunction,  and pain.   ACTIVITY LIMITATIONS: sitting and intercourse, medical assessments  with internal needs  PARTICIPATION LIMITATIONS: interpersonal relationship and community activity  PERSONAL FACTORS: 1 comorbidity: medical history  are also affecting patient's functional outcome.   REHAB POTENTIAL: Good  CLINICAL DECISION MAKING: Stable/uncomplicated  EVALUATION COMPLEXITY: Low   GOALS: Goals reviewed with patient? Yes  SHORT TERM GOALS: Target date: 08/21/22  Pt to be I with advanced HEP.  Baseline: Goal status: MET  2.  Pt will report no more than 5/10 pain due to improvements in posture, strength, and muscle length  Baseline:  Goal status: MET   LONG TERM GOALS: Target date: 10/23/22  Pt to be I with advanced HEP.   Baseline:  Goal status: MET 2.  Pt will report no more than 2/10 pain due to improvements in posture, strength, and muscle length  Baseline:  Goal status: on going  3.  Pt to report improved tolerance to vaginal penetration for improved ability to be able to climax at least 75% of the instance of intercourse.  Baseline:  Goal status: on going  4.  Pt to be I with manual self stretching of perineal body/vaginal opening to improve tissue mobility and decrease pain at home.  Baseline:  Goal status: MET  5.  Pt to demonstrate at least 4/5 pelvic floor strength with ability to fully relax post contraction for improved pelvic stability and decreased strain at pelvic floor/ decrease leakage.  Baseline:  Goal status: MET  6.  Pt to demonstrate at least 5/5 bil hip strength with good coordination of pelvic floor and breathing  for improved pelvic stability and functional squats without leakage.   Baseline:  Goal status: MET  7.  Pt to demonstrate pelvic floor contraction of at least 4/5 strength for 10s and be able to withhold during cough in standing to decreased strain at pelvic floor. Baseline:  Goal status: MET  8. Pt to demonstrate improved coordination of pelvic floor and breathing mechanics with 10# squat with appropriate synergistic patterns to decrease pain and leakage at least 75% of the time.      Baseline:  Goal status: MET  PT FREQUENCY: 1-2x/week  PT DURATION:  10 sessions  PLANNED INTERVENTIONS: Therapeutic exercises, Therapeutic activity, Neuromuscular re-education, Balance training, Gait training, Patient/Family education, Self Care, Aquatic Therapy, Dry Needling, Spinal mobilization, Cryotherapy, Moist heat, scar mobilization, Taping, Biofeedback, and Manual therapy  PLAN FOR NEXT SESSION: manual work at perineal mobility, core and hip strengthening, coordination of pelvic floor and breathing mechanics   Darryle Navy, PT, DPT 02/06/252:47 PM

## 2023-05-21 ENCOUNTER — Encounter: Payer: Self-pay | Admitting: Obstetrics and Gynecology

## 2023-05-21 ENCOUNTER — Ambulatory Visit (INDEPENDENT_AMBULATORY_CARE_PROVIDER_SITE_OTHER): Payer: BC Managed Care – PPO | Admitting: Obstetrics and Gynecology

## 2023-05-21 VITALS — BP 121/81 | HR 82

## 2023-05-21 DIAGNOSIS — M62838 Other muscle spasm: Secondary | ICD-10-CM

## 2023-05-21 DIAGNOSIS — R102 Pelvic and perineal pain: Secondary | ICD-10-CM

## 2023-05-21 MED ORDER — TRIAMCINOLONE ACETONIDE 40 MG/ML IJ SUSP
40.0000 mg | Freq: Once | INTRAMUSCULAR | Status: AC
Start: 2023-05-21 — End: 2023-05-21
  Administered 2023-05-21: 40 mg via INTRAMUSCULAR

## 2023-05-21 MED ORDER — BUPIVACAINE HCL 0.25 % IJ SOLN
9.0000 mL | Freq: Once | INTRAMUSCULAR | Status: AC
Start: 2023-05-21 — End: 2023-05-21
  Administered 2023-05-21: 9 mL

## 2023-05-21 NOTE — Progress Notes (Signed)
 [facility] Return Visit  SUBJECTIVE  History of Present Illness: Maryse HONESTEE REVARD is a 53 y.o. female seen in follow-up for perineal pain. Previously did well with trigger point injections at the perineal scar. Has been doing self massage and still seeing PT, but lately has had some more pinpoint perineal tenderness with intercourse.    Surgery: s/p Posterior repair, sacrospinous ligament fixation, perineorrhaphy, anal sphincteroplasty on 01/26/22 S/p EUA and revision perineoplasty on 02/21/22.  Past Medical History: Patient  has a past medical history of Full incontinence-feces, History of anemia, History of cancer chemotherapy, History of external beam radiation therapy, History of gastric ulcer, Hypothyroidism, postsurgical, Malignant neoplasm of upper-outer quadrant of right breast in female, estrogen receptor positive (HCC) (06/2020), Polyarthralgia, PONV (postoperative nausea and vomiting), Prolapse of vaginal vault after hysterectomy, RA (rheumatoid arthritis) (HCC), and Tachycardia.   Past Surgical History: She  has a past surgical history that includes Thyroid  lobectomy (Right, 05/2010); Appendectomy (08/2006); Colonoscopy (N/A, 06/15/2016); Vaginal hysterectomy (03/21/2019); Portacath placement (Left, 07/23/2020); Breast lumpectomy with radioactive seed and sentinel lymph node biopsy (Right, 12/11/2020); Breast reconstruction (Right, 12/20/2020); Breast reduction surgery (Left, 12/20/2020); Incision and drainage of wound (Right, 01/10/2021); Dilation and curettage of uterus (06/03/2011); Ovum / oocyte retrieval; Excision of breast biopsy (Right, 1999); Anterior and posterior repair with sacrospinous fixation (N/A, 01/26/2022); Perineoplasty (N/A, 01/26/2022); Perineoplasty (N/A, 02/19/2022); and Breast biopsy (Right, 07/30/2022).   Medications: She has a current medication list which includes the following prescription(s): acetaminophen , anastrozole , baclofen, bystolic, cetirizine,  clobetasol ointment, estradiol , gabapentin , levothyroxine, lidocaine , metronidazole , pantoprazole, polyethylene glycol 3350 , triamcinolone , and wegovy, and the following Facility-Administered Medications: bupivacaine  and triamcinolone  acetonide.   Allergies: Patient is allergic to codeine, hyaluronic acid [sodium hyaluronate (non-avian)], hydrocodone, other, latex, and wound dressing adhesive.   Social History: Patient  reports that she has never smoked. She has never used smokeless tobacco. She reports that she does not currently use alcohol. She reports that she does not use drugs.     OBJECTIVE     Physical Exam: Vitals:   05/21/23 0836  BP: 121/81  Pulse: 82   Gen: No apparent distress, A&O x 3.  Detailed Urogynecologic Evaluation:   Tenderness with pressure over the perineum, tight levator muscles internally R> L  Trigger point injections  Informed Consent:  The alternatives, risks and benefits of the procedure were explained to the patient. Risks including, but not limited to discomfort, pain, bleeding, infection, injury to nearby structures, inability to perform the procedure, failure of the procedure were discussed.  All questions were answered and the patient elected to proceed.  Procedure:   The patient was positioned in dorsal lithotomy position.  The vaginal tissues were prepped with Hibiclens  solution.  An injection of 1010cc  of a mixture of 90% anesthetic (0.25% Bupivacaine ) and 10% 40mg /ml Triamcinolone  acetomide (Kenalog ) was performed at the perineum and in the levator muscles bilaterally, a total of 3 injection sites. Pressure was held over bleeding areas until good hemostasis was achieved.   The patient tolerated the procedure well with no apparent complications.    ASSESSMENT AND PLAN    Ms. Schuff is a 53 y.o. with:  1. Perineal pain   2. Levator spasm     - Will plan to do a series of weekly injections.  - She will continue with PT and self massage of  the perineal area.    Rosaline LOISE Caper, MD

## 2023-05-27 ENCOUNTER — Ambulatory Visit: Payer: BC Managed Care – PPO | Admitting: Obstetrics and Gynecology

## 2023-06-04 ENCOUNTER — Ambulatory Visit: Payer: BC Managed Care – PPO | Admitting: Obstetrics and Gynecology

## 2023-06-04 ENCOUNTER — Encounter: Payer: Self-pay | Admitting: Obstetrics and Gynecology

## 2023-06-04 VITALS — BP 105/72 | HR 75

## 2023-06-04 DIAGNOSIS — M62838 Other muscle spasm: Secondary | ICD-10-CM | POA: Diagnosis not present

## 2023-06-04 DIAGNOSIS — R102 Pelvic and perineal pain: Secondary | ICD-10-CM

## 2023-06-04 MED ORDER — BUPIVACAINE HCL 0.25 % IJ SOLN
9.0000 mL | Freq: Once | INTRAMUSCULAR | Status: AC
Start: 2023-06-04 — End: ?

## 2023-06-04 MED ORDER — TRIAMCINOLONE ACETONIDE 40 MG/ML IJ SUSP
40.0000 mg | Freq: Once | INTRAMUSCULAR | Status: AC
Start: 2023-06-04 — End: ?

## 2023-06-04 NOTE — Progress Notes (Signed)
[facility] Return Visit  SUBJECTIVE  History of Present Illness: Sandy Goodwin is a 53 y.o. female seen in follow-up for perineal pain and dysparenuia. Last visit had trigger point point injections into bilateral levators and at the perineum. This did help improve her symptoms and she would like another injection today.    Surgery: s/p Posterior repair, sacrospinous ligament fixation, perineorrhaphy, anal sphincteroplasty on 01/26/22 S/p EUA and revision perineoplasty on 02/21/22.   Past Medical History: Patient  has a past medical history of Full incontinence-feces, History of anemia, History of cancer chemotherapy, History of external beam radiation therapy, History of gastric ulcer, Hypothyroidism, postsurgical, Malignant neoplasm of upper-outer quadrant of right breast in female, estrogen receptor positive (HCC) (06/2020), Polyarthralgia, PONV (postoperative nausea and vomiting), Prolapse of vaginal vault after hysterectomy, RA (rheumatoid arthritis) (HCC), and Tachycardia.   Past Surgical History: She  has a past surgical history that includes Thyroid lobectomy (Right, 05/2010); Appendectomy (08/2006); Colonoscopy (N/A, 06/15/2016); Vaginal hysterectomy (03/21/2019); Portacath placement (Left, 07/23/2020); Breast lumpectomy with radioactive seed and sentinel lymph node biopsy (Right, 12/11/2020); Breast reconstruction (Right, 12/20/2020); Breast reduction surgery (Left, 12/20/2020); Incision and drainage of wound (Right, 01/10/2021); Dilation and curettage of uterus (06/03/2011); Ovum / oocyte retrieval; Excision of breast biopsy (Right, 1999); Anterior and posterior repair with sacrospinous fixation (N/A, 01/26/2022); Perineoplasty (N/A, 01/26/2022); Perineoplasty (N/A, 02/19/2022); and Breast biopsy (Right, 07/30/2022).   Medications: She has a current medication list which includes the following prescription(s): acetaminophen, anastrozole, baclofen, bystolic, cetirizine,  clobetasol ointment, estradiol, gabapentin, levothyroxine, lidocaine, metronidazole, pantoprazole, polyethylene glycol 3350, triamcinolone, and wegovy, and the following Facility-Administered Medications: bupivacaine and triamcinolone acetonide.   Allergies: Patient is allergic to codeine, hyaluronic acid [sodium hyaluronate (non-avian)], hydrocodone, other, latex, and wound dressing adhesive.   Social History: Patient  reports that she has never smoked. She has never used smokeless tobacco. She reports that she does not currently use alcohol. She reports that she does not use drugs.     OBJECTIVE     Physical Exam: Vitals:   06/04/23 1027  BP: 105/72  Pulse: 75   Gen: No apparent distress, A&O x 3.   Trigger point injections  Informed Consent:  The alternatives, risks and benefits of the procedure were explained to the patient. Risks including, but not limited to discomfort, pain, bleeding, infection, injury to nearby structures, inability to perform the procedure, failure of the procedure were discussed.  All questions were answered and the patient elected to proceed.  Procedure:   The patient was positioned in dorsal lithotomy position.  The vaginal tissues were prepped with Hibiclens solution.  An injection of 1010cc  of a mixture of 90% anesthetic (0.25% Bupivacaine) and 10% 40mg /ml Triamcinolone acetomide (Kenalog) was performed at the perineum and in the levator muscles bilaterally, a total of 3 injection sites. Pressure was held over bleeding areas until good hemostasis was achieved.   The patient tolerated the procedure well with no apparent complications.    ASSESSMENT AND PLAN    Ms. Schaus is a 53 y.o. with:  No diagnosis found.  - return 1-2 weeks for repeat injection   Marguerita Beards, MD

## 2023-06-11 ENCOUNTER — Other Ambulatory Visit: Payer: Self-pay | Admitting: Obstetrics and Gynecology

## 2023-06-11 DIAGNOSIS — N952 Postmenopausal atrophic vaginitis: Secondary | ICD-10-CM

## 2023-06-14 ENCOUNTER — Ambulatory Visit: Payer: BC Managed Care – PPO | Attending: General Surgery

## 2023-06-14 VITALS — Wt 181.1 lb

## 2023-06-14 DIAGNOSIS — Z483 Aftercare following surgery for neoplasm: Secondary | ICD-10-CM | POA: Insufficient documentation

## 2023-06-14 NOTE — Therapy (Signed)
 OUTPATIENT PHYSICAL THERAPY SOZO SCREENING NOTE   Patient Name: Sandy Goodwin MRN: [id number] DOB:1970/05/13, 53 y.o., female Today's Date: 06/14/2023  PCP: Richardean Chimera, MD REFERRING PROVIDER: Emelia Loron, MD   PT End of Session - 06/14/23 1102     Visit Number 22   # unchanged due to screen only   PT Start Time 1100    PT Stop Time 1103    PT Time Calculation (min) 3 min    Activity Tolerance Patient tolerated treatment well    Behavior During Therapy [facility] for tasks assessed/performed             Past Medical History:  Diagnosis Date   Full incontinence-feces    History of anemia    History of cancer chemotherapy    right breast;   neo-adjuvent 07-24-2020  to 11-15-2020  and 12-05-2020  to 07-24-2021   History of external beam radiation therapy    right reast 02-04-2021  to 03-25-2021   History of gastric ulcer    remote hx -- resolved   Hypothyroidism, postsurgical    followed by pcp;     02/ 2012  s/p  right hemithyroidectomy  (follicular adenoma)   Malignant neoplasm of upper-outer quadrant of right breast in female, estrogen receptor positive (HCC) 06/2020   oncologist--- dr Pamelia Hoit;  DCIS high grade right breast , neo-adjuvant chemo completed 11-15-2020,  s/p right breast lumpectomy w/ node dissection on 12-11-2020, then chemo completed 07-24-2021 and radiation completed 03-25-2021   Polyarthralgia    PONV (postoperative nausea and vomiting)    With first surgery, none since   Prolapse of vaginal vault after hysterectomy    and posterior prolapse   RA (rheumatoid arthritis) (HCC)    rheumatologsit--- dr Dierdre Forth;  no treatment   Tachycardia    Tachycardia   Past Surgical History:  Procedure Laterality Date   ANTERIOR AND POSTERIOR REPAIR WITH SACROSPINOUS FIXATION N/A 01/26/2022   Procedure: POSTERIOR REPAIR WITH SACROSPINOUS FIXATION;  Surgeon: Marguerita Beards, MD;  Location: [facility];  Service: Gynecology;  Laterality: N/A;   total time requested is 2 hours   APPENDECTOMY  08/2006   exploratory laparatomy w/ appendectomy (ruptured) and repair bowel   BREAST BIOPSY Right 07/30/2022   MM RT BREAST BX W LOC DEV 1ST LESION IMAGE BX SPEC STEREO GUIDE 07/30/2022 [facility]   BREAST LUMPECTOMY WITH RADIOACTIVE SEED AND SENTINEL LYMPH NODE BIOPSY Right 12/11/2020   Procedure: RIGHT BREAST LUMPECTOMY WITH RADIOACTIVE SEED X2 AND RIGHT AXILLARY SENTINEL LYMPH NODE BIOPSY;  Surgeon: Emelia Loron, MD;  Location: [facility];  Service: General;  Laterality: Right;   BREAST RECONSTRUCTION Right 12/20/2020   Procedure: RIGHT ONCOPLASTIC RECONSTRUCTION;  Surgeon: Glenna Fellows, MD;  Location: [facility];  Service: Plastics;  Laterality: Right;   BREAST REDUCTION SURGERY Left 12/20/2020   Procedure: LEFT BREAST REDUCTION;  Surgeon: Glenna Fellows, MD;  Location: [facility];  Service: Plastics;  Laterality: Left;   COLONOSCOPY N/A 06/15/2016   Procedure: COLONOSCOPY;  Surgeon: Malissa Hippo, MD;  [facility];  Service: Endoscopy;  Laterality: N/A;  845   DILATION AND CURETTAGE OF UTERUS  06/03/2011   w/ suction for miscarriage   EXCISION OF BREAST BIOPSY Right 1999   INCISION AND DRAINAGE OF WOUND Right 01/10/2021   Procedure: INCISION AND DRAINAGE OF RIGHT AXILLA;  Surgeon: Glenna Fellows, MD;  Location: [facility];  Service: Plastics;  Laterality: Right;  OVUM / OOCYTE RETRIEVAL     2010  and 2012   PERINEOPLASTY N/A 01/26/2022   Procedure: PERINEORRHAPY;  Surgeon: Marguerita Beards, MD;  Location: [facility];  Service: Gynecology;  Laterality: N/A;   PERINEOPLASTY N/A 02/19/2022   Procedure: PERINEOPLASTY revision;  Surgeon: Marguerita Beards, MD;  Location: [facility];  Service: Gynecology;  Laterality: N/A;   PORTACATH PLACEMENT Left 07/23/2020   Procedure: INSERTION PORT-A-CATH;  Surgeon:  Emelia Loron, MD;  Location: [facility];  Service: General;  Laterality: Left;   THYROID LOBECTOMY Right 05/2010   @[facility]   VAGINAL HYSTERECTOMY  03/21/2019   @ [facility] by dr Mora Appl;   w/ Rogelio Seen culdoplasty   Patient Active Problem List   Diagnosis Date Noted   Osteopenia 12/31/2022   Prolapse of posterior vaginal wall 01/26/2022   Uterine prolapse 03/20/2021   Polyarthralgia 03/20/2021   Port-A-Cath in place 02/06/2021   Genetic testing 07/17/2020   Malignant neoplasm of upper-outer quadrant of right breast in female, estrogen receptor positive (HCC) 07/12/2020   Change in bowel habits 06/12/2016   Mixed hyperlipidemia 01/07/2016   Hypothyroidism, unspecified 01/07/2016   Allergic rhinitis 04/02/2015   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   PRECAUTIONS: right UE Lymphedema risk, None  SUBJECTIVE: Pt returns for her first 6 month L-Dex screen.   PAIN:  Are you having pain? No  SOZO SCREENING: Patient was assessed today using the SOZO machine to determine the lymphedema index score. This was compared to her baseline score. It was determined that she is within the recommended range when compared to her baseline and no further action is needed at this time. She will continue SOZO screenings. These are done every 3 months for 2 years post operatively followed by every 6 months for 2 years, and then annually.   L-DEX FLOWSHEETS - 06/14/23 1100       L-DEX LYMPHEDEMA SCREENING   Measurement Type Unilateral    L-DEX MEASUREMENT EXTREMITY Upper Extremity     POSITION  Standing    DOMINANT SIDE Right    At Risk Side Right    BASELINE SCORE (UNILATERAL) -3    L-DEX SCORE (UNILATERAL) -4.1    VALUE CHANGE (UNILAT) -1.1               Hermenia Bers, PTA 06/14/2023, 11:03 AM

## 2023-06-15 ENCOUNTER — Other Ambulatory Visit: Payer: Self-pay | Admitting: *Deleted

## 2023-06-15 DIAGNOSIS — Z170 Estrogen receptor positive status [ER+]: Secondary | ICD-10-CM

## 2023-06-15 NOTE — Progress Notes (Signed)
 Signatera renewal orders placed per MD request.

## 2023-06-18 ENCOUNTER — Ambulatory Visit: Payer: BC Managed Care – PPO | Admitting: Obstetrics and Gynecology

## 2023-06-18 ENCOUNTER — Encounter: Payer: Self-pay | Admitting: Obstetrics and Gynecology

## 2023-06-18 VITALS — BP 111/78 | HR 70

## 2023-06-18 DIAGNOSIS — M62838 Other muscle spasm: Secondary | ICD-10-CM | POA: Diagnosis not present

## 2023-06-18 DIAGNOSIS — R102 Pelvic and perineal pain: Secondary | ICD-10-CM

## 2023-06-18 MED ORDER — BUPIVACAINE HCL 0.25 % IJ SOLN
9.0000 mL | Freq: Once | INTRAMUSCULAR | Status: AC
  Administered 2023-06-18: 9 mL

## 2023-06-18 MED ORDER — TRIAMCINOLONE ACETONIDE 40 MG/ML IJ SUSP
40.0000 mg | Freq: Once | INTRAMUSCULAR | Status: AC
  Administered 2023-06-18: 40 mg via INTRAMUSCULAR

## 2023-06-18 NOTE — Progress Notes (Signed)
 [name] is a 53 y.o. female who presents for levator trigger point injection.   Vitals:   06/18/23 1037  BP: 111/78  Pulse: 70     Indication(s): Levator spasm and dyspareunia  Informed Consent:  The alternatives, risks and benefits of the procedure were explained to the patient. Risks including, but not limited to discomfort, pain, bleeding, infection, injury to nearby structures, inability to perform the procedure, failure of the procedure were discussed.  All questions were answered and the patient elected to proceed.  Procedure:   The patient was positioned in dorsal lithotomy position.  The vaginal tissues were prepped with Hibiclens solution.  An injection of 10cc of a mixture of 90% anesthetic (0.25% Bupivacaine) and 10% 40mg /ml Triamcinolone acetomide (Kenalog) was performed in multiple site at the perineum and in the left levator muscles, a total of 4 injection sites.  Pressure was held over bleeding areas until good hemostasis was achieved.   The patient tolerated the procedure well with no apparent complications.  Marguerita Beards, MD

## 2023-06-24 ENCOUNTER — Encounter: Payer: Self-pay | Admitting: Obstetrics and Gynecology

## 2023-06-24 ENCOUNTER — Ambulatory Visit (INDEPENDENT_AMBULATORY_CARE_PROVIDER_SITE_OTHER): Admitting: Obstetrics and Gynecology

## 2023-06-24 VITALS — BP 126/74 | HR 77

## 2023-06-24 DIAGNOSIS — R102 Pelvic and perineal pain: Secondary | ICD-10-CM | POA: Diagnosis not present

## 2023-06-24 DIAGNOSIS — M62838 Other muscle spasm: Secondary | ICD-10-CM

## 2023-06-24 MED ORDER — BUPIVACAINE HCL 0.25 % IJ SOLN
9.0000 mL | Freq: Once | INTRAMUSCULAR | Status: AC
  Administered 2023-06-24: 9 mL

## 2023-06-24 MED ORDER — TRIAMCINOLONE ACETONIDE 40 MG/ML IJ SUSP
40.0000 mg | Freq: Once | INTRAMUSCULAR | Status: AC
  Administered 2023-06-24: 40 mg via INTRAMUSCULAR

## 2023-06-24 NOTE — Progress Notes (Signed)
 Sandy Goodwin is a 53 y.o. female who presents for levator trigger point injection.   Vitals:   06/24/23 1010  BP: 126/74  Pulse: 77      Indication(s): Levator spasm and dyspareunia  Informed Consent:  The alternatives, risks and benefits of the procedure were explained to the patient. Risks including, but not limited to discomfort, pain, bleeding, infection, injury to nearby structures, inability to perform the procedure, failure of the procedure were discussed.  All questions were answered and the patient elected to proceed.  Procedure:   The patient was positioned in dorsal lithotomy position.  The vaginal tissues were prepped with Hibiclens solution.  An injection of 10cc of a mixture of 90% anesthetic (0.25% Bupivacaine) and 10% 40mg /ml Triamcinolone acetomide (Kenalog) was performed in multiple site at the perineum and in the right and left levator muscles, a total of 4 injection sites.  Pressure was held over bleeding areas until good hemostasis was achieved.   The patient tolerated the procedure well with no apparent complications.  Marguerita Beards, MD

## 2023-06-25 ENCOUNTER — Encounter: Payer: Self-pay | Admitting: Hematology and Oncology

## 2023-06-26 LAB — SIGNATERA ONLY (NATERA MANAGED)

## 2023-06-28 ENCOUNTER — Ambulatory Visit: Payer: Self-pay | Attending: Obstetrics and Gynecology | Admitting: Physical Therapy

## 2023-06-28 ENCOUNTER — Encounter: Payer: Self-pay | Admitting: Physical Therapy

## 2023-06-28 DIAGNOSIS — M62838 Other muscle spasm: Secondary | ICD-10-CM | POA: Diagnosis present

## 2023-06-28 DIAGNOSIS — M6281 Muscle weakness (generalized): Secondary | ICD-10-CM | POA: Diagnosis present

## 2023-06-28 DIAGNOSIS — R293 Abnormal posture: Secondary | ICD-10-CM | POA: Diagnosis present

## 2023-06-28 NOTE — Therapy (Signed)
 OUTPATIENT PHYSICAL THERAPY FEMALE PELVIC TREATMENT   Patient Name: Sandy Goodwin MRN: [id number] DOB:05-Feb-1971, 53 y.o., female Today's Date: 06/28/2023   END OF SESSION:  PT End of Session - 06/28/23 0935     Visit Number 23    Date for PT Re-Evaluation 08/04/23    Authorization Type BCBS    PT Start Time 0932    PT Stop Time 1014    PT Time Calculation (min) 42 min    Activity Tolerance Patient tolerated treatment well    Behavior During Therapy [facility] for tasks assessed/performed              Past Medical History:  Diagnosis Date   Full incontinence-feces    History of anemia    History of cancer chemotherapy    right breast;   neo-adjuvent 07-24-2020  to 11-15-2020  and 12-05-2020  to 07-24-2021   History of external beam radiation therapy    right reast 02-04-2021  to 03-25-2021   History of gastric ulcer    remote hx -- resolved   Hypothyroidism, postsurgical    followed by pcp;     02/ 2012  s/p  right hemithyroidectomy  (follicular adenoma)   Malignant neoplasm of upper-outer quadrant of right breast in female, estrogen receptor positive (HCC) 06/2020   oncologist--- dr Pamelia Hoit;  DCIS high grade right breast , neo-adjuvant chemo completed 11-15-2020,  s/p right breast lumpectomy w/ node dissection on 12-11-2020, then chemo completed 07-24-2021 and radiation completed 03-25-2021   Polyarthralgia    PONV (postoperative nausea and vomiting)    With first surgery, none since   Prolapse of vaginal vault after hysterectomy    and posterior prolapse   RA (rheumatoid arthritis) (HCC)    rheumatologsit--- dr Dierdre Forth;  no treatment   Tachycardia    Tachycardia   Past Surgical History:  Procedure Laterality Date   ANTERIOR AND POSTERIOR REPAIR WITH SACROSPINOUS FIXATION N/A 01/26/2022   Procedure: POSTERIOR REPAIR WITH SACROSPINOUS FIXATION;  Surgeon: Marguerita Beards, MD;  Location: [facility];  Service: Gynecology;  Laterality: N/A;  total time  requested is 2 hours   APPENDECTOMY  08/2006   exploratory laparatomy w/ appendectomy (ruptured) and repair bowel   BREAST BIOPSY Right 07/30/2022   MM RT BREAST BX W LOC DEV 1ST LESION IMAGE BX SPEC STEREO GUIDE 07/30/2022 [facility]   BREAST LUMPECTOMY WITH RADIOACTIVE SEED AND SENTINEL LYMPH NODE BIOPSY Right 12/11/2020   Procedure: RIGHT BREAST LUMPECTOMY WITH RADIOACTIVE SEED X2 AND RIGHT AXILLARY SENTINEL LYMPH NODE BIOPSY;  Surgeon: Emelia Loron, MD;  Location: [facility];  Service: General;  Laterality: Right;   BREAST RECONSTRUCTION Right 12/20/2020   Procedure: RIGHT ONCOPLASTIC RECONSTRUCTION;  Surgeon: Glenna Fellows, MD;  Location: [facility];  Service: Plastics;  Laterality: Right;   BREAST REDUCTION SURGERY Left 12/20/2020   Procedure: LEFT BREAST REDUCTION;  Surgeon: Glenna Fellows, MD;  Location: [facility];  Service: Plastics;  Laterality: Left;   COLONOSCOPY N/A 06/15/2016   Procedure: COLONOSCOPY;  Surgeon: Malissa Hippo, MD;  [facility];  Service: Endoscopy;  Laterality: N/A;  845   DILATION AND CURETTAGE OF UTERUS  06/03/2011   w/ suction for miscarriage   EXCISION OF BREAST BIOPSY Right 1999   INCISION AND DRAINAGE OF WOUND Right 01/10/2021   Procedure: INCISION AND DRAINAGE OF RIGHT AXILLA;  Surgeon: Glenna Fellows, MD;  Location: [facility];  Service: Plastics;  Laterality: Right;  OVUM / OOCYTE RETRIEVAL     2010  and 2012   PERINEOPLASTY N/A 01/26/2022   Procedure: PERINEORRHAPY;  Surgeon: Marguerita Beards, MD;  Location: [facility];  Service: Gynecology;  Laterality: N/A;   PERINEOPLASTY N/A 02/19/2022   Procedure: PERINEOPLASTY revision;  Surgeon: Marguerita Beards, MD;  Location: [facility];  Service: Gynecology;  Laterality: N/A;   PORTACATH PLACEMENT Left 07/23/2020   Procedure: INSERTION PORT-A-CATH;  Surgeon: Emelia Loron, MD;  Location: [facility];  Service: General;  Laterality: Left;   THYROID LOBECTOMY Right 05/2010   @[facility]   VAGINAL HYSTERECTOMY  03/21/2019   @ [facility] by dr Mora Appl;   w/ Rogelio Seen culdoplasty   Patient Active Problem List   Diagnosis Date Noted   Osteopenia 12/31/2022   Prolapse of posterior vaginal wall 01/26/2022   Uterine prolapse 03/20/2021   Polyarthralgia 03/20/2021   Port-A-Cath in place 02/06/2021   Genetic testing 07/17/2020   Malignant neoplasm of upper-outer quadrant of right breast in female, estrogen receptor positive (HCC) 07/12/2020   Change in bowel habits 06/12/2016   Mixed hyperlipidemia 01/07/2016   Hypothyroidism, unspecified 01/07/2016   Allergic rhinitis 04/02/2015   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    PCP: Richardean Chimera, MD  REFERRING PROVIDER: Selmer Dominion, NP  REFERRING DIAG: [phone] (ICD-10-CM) - Post-operative state  THERAPY DIAG:  Muscle weakness (generalized)  Other muscle spasm  Rationale for Evaluation and Treatment: Rehabilitation  ONSET DATE: noticed at last pelvic exam in [place]  SUBJECTIVE:                                                                                                                                                                                           SUBJECTIVE STATEMENT: Pt reports she is still having some urine leakage (drops) with post voids and getting trigger point injections (now 4) at levator which is helping pain internally. Has struggled to get PT appointments but has gotten a few scheduled. Reports MD encourages continued stretching with assist from PT as well as pt struggles to complete it as well with wand at home  PAIN:  Are you having pain? No - at this time.  2/10 at area at vaginal opening with intercourse   PRECAUTIONS: Other: breast CA - not currently getting treatments   WEIGHT BEARING RESTRICTIONS: No  FALLS:  Has patient fallen in last 6  months? No  LIVING ENVIRONMENT: Lives with: lives with their family   OCCUPATION: PA  PLOF: Independent  PATIENT GOALS: to have no pain with sex  PERTINENT HISTORY:   Malignant neoplasm of upper-outer quadrant of  right breast in female, estrogen receptor positive [facility])  Surgery: s/p Posterior repair, sacrospinous ligament fixation, perineorrhaphy, anal sphincteroplasty on 01/26/22 S/p EUA and revision perineoplasty on 02/21/22.  Sexual abuse: No  BOWEL MOVEMENT: Pain with bowel movement: No Type of bowel movement: normal for pt Fully empty rectum: Yes:   Leakage: No Pads: No Fiber supplement: No  URINATION: Pain with urination: No Fully empty bladder: Yes:   Stream: Strong Urgency: No Frequency: not quicker than every 2 hours Leakage:  none Pads: No  INTERCOURSE: Pain with intercourse: Initial Penetration and During Penetration Ability to have vaginal penetration:  Yes: but painful Climax: not really due to pain Marinoff Scale: 2/3  PREGNANCY: Vaginal deliveries 2 Tearing Yes: 3rd deg tear posterior with first and anterior second  C-section deliveries 0 Currently pregnant No  PROLAPSE: None   OBJECTIVE:   DIAGNOSTIC FINDINGS:    COGNITION: Overall cognitive status: Within functional limits for tasks assessed     SENSATION: Light touch: Appears intact Proprioception: Appears intact  MUSCLE LENGTH: Hamstrings and adductors limited by 25%    POSTURE: rounded shoulders, forward head, and flexed trunk   LUMBARAROM/PROM:  A/PROM A/PROM  eval  Flexion Limited by 25%  Extension WFL  Right lateral flexion WFL  Left lateral flexion WFL  Right rotation WFL  Left rotation WFL   (Blank rows = not tested)  LOWER EXTREMITY ROM:  WFL  LOWER EXTREMITY MMT:  Hip abduction 3+/5 bil, all other 4/5, knees 5/5 PALPATION:   General  no TTP externally                 External Perineal Exam mild TTP at posterior vaginal opening externally 10/08/22 -  TTP at bil labia minora however reports she needs to add her cream externally to help with tissue mobility and moisture.                              Internal Pelvic Floor bil bulbocavernosus, at vaginal opening at clock face 8-4 locations, no TTP at deep layers except near coccyx. perineal scar tissue restriction/tenderness,  10/08/22 - Lt bulbocavernosus, tension most felt at 4-5 on clock face of vaginal opening, also TTP throughout Lt pubococcygeus possible puborectalis with trigger points noted. Improving perineal scar mobility with mild restrictions now.   Patient confirms identification and approves PT to assess internal pelvic floor and treatment Yes  PELVIC MMT:   MMT eval 10/08/22  11/26/22  04/29/23  06/28/23   Vaginal 3/5, 8s, 10 reps 3/5, 10s, 10 reps - however focus of training has been improved pain management with decreased tension 4/5 5reps 8s 4/5, 10s, 7 reps 4/5, 8s, 8 reps  Internal Anal Sphincter       External Anal Sphincter       Puborectalis       Diastasis Recti       (Blank rows = not tested)        TONE: Pelvic floor atrophy   PROLAPSE: Not seen in hooklying  11/12/22 - anterior wall laxity with cough in hooklying and standing.   TODAY'S TREATMENT:  DATE:   05/06/23:  Patient consented to internal pelvic floor treatment vaginally this date for manual work - at vaginal opening Rt lateral side focus of treatment with gentle stretching for decreased pain with penetration.  Continued   improvement noted. Pt did have muscle twitching noted here and sharp pain reported with this stretching, less stretch provided with better tolerance and less pain. Mild release noted but not resolved. Did discuss with pt possibly returning to MD for a few additional trigger point injects to complete mobility and decreased pain here. She agreed.   X10 15# squats   10# farmers carry 400' both hands  05/20/23  Patient consented to internal pelvic floor treatment vaginally this date for manual work - at vaginal opening Rt lateral side focus of treatment with gentle stretching for decreased pain with penetration.  Continued   improvement noted. Pt did have muscle twitching noted here and achy pain reported with stretching but tolerated better than last session. Mild release noted but not resolved. Did have more external TTP at Rt bulbocavernosus just outside of vaginal opening and pt reports this feels like it is radiating "kind of wrapping into vagina". And with location pt pointed to does demonstrate internally at bulbocavernosus area. Manual with gentle stretching and soft tissue release work complete here as well.    06/28/23:  Manual at vaginal opening LT/midline/Rt bulbocavernous. Did have greatly improved mobility with less pain compared to previous session. Pt reports she has been getting trigger point injections which have been helping too. With this MD states continued stretching PT to help improve tissue mobility. Pt is still using wand with vibration and helps. During manual PT educated to practice diaphragmatic breathing with urination and pelvic mobility to fully empty bladder, standing when done then return to sitting to insure all urine has emptied. Pt agreed.  With internal - once relaxation improved and pain decreased progressed to 3x10 pelvic floor contractions, x10 5-8s holds, 2x10 quick flicks without pain.   PATIENT EDUCATION:  Education details: [id number] Person educated: Patient Education method: Explanation, Demonstration, Actor cues, Verbal cues, and Handouts Education comprehension: verbalized understanding and returned demonstration  HOME EXERCISE PROGRAM: [id number]    ASSESSMENT:  CLINICAL IMPRESSION: Patient presents for treatment today. Pt session focused on manual work at vaginal opening for decreased pain and improved tissue  mobility for tolerance to medical exams and intercourse. Pt tolerated manual work well . Improved mobility noted with less pain today and pt reports intercourse has been getting better with trigger point injections. Strengthening of pelvic floor post manual work. Plans to progress session next appointment for pelvic floor strengthening and core/hip strengthening as pain more manageable. Pt would benefit from additional PT to continue to progress further toward goals with improved pelvic floor strength and decreased pain with penetration, progress pelvic floor strengthening and coordination with activity/exercise for decreased risk of re-injury to pelvic floor and prolapse and improve pelvic floor strength to improve prolapse as much as possible.  Pt does have appt with Dr. Florian Buff tomorrow for trigger point injections and plans to discuss pain, leakage and possible pessary use.   OBJECTIVE IMPAIRMENTS: decreased coordination, decreased endurance, decreased mobility, decreased strength, increased fascial restrictions, increased muscle spasms, impaired flexibility, improper body mechanics, postural dysfunction, and pain.   ACTIVITY LIMITATIONS: sitting and intercourse, medical assessments  with internal needs  PARTICIPATION LIMITATIONS: interpersonal relationship and community activity  PERSONAL FACTORS: 1 comorbidity: medical history  are also affecting patient's functional outcome.   REHAB POTENTIAL: Good  CLINICAL DECISION MAKING: Stable/uncomplicated  EVALUATION COMPLEXITY: Low   GOALS: Goals reviewed with patient? Yes  SHORT TERM GOALS: Target date: 08/21/22  Pt to be I with advanced HEP.  Baseline: Goal status: MET  2.  Pt will report no more than 5/10 pain due to improvements in posture, strength, and muscle length  Baseline:  Goal status: MET   LONG TERM GOALS: Target date: 10/23/22  Pt to be I with advanced HEP.  Baseline:  Goal status: MET 2.  Pt will report no more than  2/10 pain due to improvements in posture, strength, and muscle length  Baseline:  Goal status: on going  3.  Pt to report improved tolerance to vaginal penetration for improved ability to be able to climax at least 75% of the instance of intercourse.  Baseline:  Goal status: on going  4.  Pt to be I with manual self stretching of perineal body/vaginal opening to improve tissue mobility and decrease pain at home.  Baseline:  Goal status: MET  5.  Pt to demonstrate at least 4/5 pelvic floor strength with ability to fully relax post contraction for improved pelvic stability and decreased strain at pelvic floor/ decrease leakage.  Baseline:  Goal status: MET  6.  Pt to demonstrate at least 5/5 bil hip strength with good coordination of pelvic floor and breathing  for improved pelvic stability and functional squats without leakage.   Baseline:  Goal status: MET  7.  Pt to demonstrate pelvic floor contraction of at least 4/5 strength for 10s and be able to withhold during cough in standing to decreased strain at pelvic floor. Baseline:  Goal status: MET  8. Pt to demonstrate improved coordination of pelvic floor and breathing mechanics with 10# squat with appropriate synergistic patterns to decrease pain and leakage at least 75% of the time.      Baseline:  Goal status: MET  PT FREQUENCY: 1-2x/week  PT DURATION:  10 sessions  PLANNED INTERVENTIONS: Therapeutic exercises, Therapeutic activity, Neuromuscular re-education, Balance training, Gait training, Patient/Family education, Self Care, Aquatic Therapy, Dry Needling, Spinal mobilization, Cryotherapy, Moist heat, scar mobilization, Taping, Biofeedback, and Manual therapy  PLAN FOR NEXT SESSION: manual work at perineal mobility, core and hip strengthening, coordination of pelvic floor and breathing mechanics   Otelia Sergeant, PT, DPT 06/27/2508:20 AM

## 2023-06-29 ENCOUNTER — Telehealth: Payer: Self-pay

## 2023-06-29 ENCOUNTER — Other Ambulatory Visit: Payer: Self-pay | Admitting: Hematology and Oncology

## 2023-06-29 ENCOUNTER — Telehealth: Payer: Self-pay | Admitting: Pharmacy Technician

## 2023-06-29 ENCOUNTER — Inpatient Hospital Stay: Attending: Hematology and Oncology | Admitting: Hematology and Oncology

## 2023-06-29 DIAGNOSIS — D509 Iron deficiency anemia, unspecified: Secondary | ICD-10-CM

## 2023-06-29 DIAGNOSIS — C50411 Malignant neoplasm of upper-outer quadrant of right female breast: Secondary | ICD-10-CM | POA: Diagnosis not present

## 2023-06-29 DIAGNOSIS — Z170 Estrogen receptor positive status [ER+]: Secondary | ICD-10-CM

## 2023-06-29 NOTE — Telephone Encounter (Signed)
 Auth Submission: NO AUTH NEEDED Site of care: Site of care: [facility] Payer: [employer]BS [place] Medication & CPT/J Code(s) submitted: Venofer (Iron Sucrose) [id number] Route of submission (phone, fax, portal):  Phone # Fax # Auth type: Buy/Bill PB Units/visits requested: 3 DOSES Reference number:  Approval from: 06/29/23 to 11/29/23

## 2023-06-29 NOTE — Telephone Encounter (Signed)
 [name], patient will be scheduled as soon as possible.  Auth Submission: NO AUTH NEEDED Site of care: Site of care: [facility] Payer: [employer] commercial Medication & CPT/J Code(s) submitted: Venofer (Iron Sucrose) J1756 Route of submission (phone, fax, portal): phone Phone # [phone] Fax # Auth type: Buy/Bill PB Units/visits requested: 300mg  x 3 doses Reference number: [id number] Approval from: 06/29/23 to 12/30/23

## 2023-06-29 NOTE — Assessment & Plan Note (Signed)
 07/08/2020: Screening mammogram showed right breast calcifications. Diagnostic mammogram showed right breast calcifications spanning 4.1cm and no abnormal right axillary lymph nodes. 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 Genetics done in 2019: Negative   Treatment plan: 1. Neoadjuvant chemotherapy with TCH Perjeta 6 cycles completed 11/15/2020 followed by Herceptin Perjeta maintenance for 1 year 2. 12/11/2020:Right breast lumpectomy Dwain Sarna): Scattered foci of high-grade DCIS with calcifications, no invasive cancer, pathologic complete response, margins negative, 0/3 lymph nodes negative, previously ER 80%, PR 40%, HER2 positive, Ki-67 25% 3. Followed by adjuvant radiation therapy  started 02/04/2021 4.  Followed by adjuvant antiestrogen therapy switched back to anastrozole from tamoxifen. 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 URCC nausea study ---------------------------------------------------------------------------------------------------------------------------------------   Breast Cancer Surveillance: 1. Breast MRI: 12/28/2022 benign, breast density category C 2. DEXA scan. 12/28/2022: T score -2.4: Recommended bisphosphonate therapy.  I recommend Zometa once a year starting next week.  Labs were done by her primary care physician on 12/29/2022 which showed normal creatinine 0.73 and normal calcium 9.2.   3. Mammograms: 01/15/2023 right breast: Benign breast density category C, bilateral mammograms to be done April 2025 4.  Signatera for MRD monitoring: Negative 5.  Breast MRI 12/28/2022: Benign breast density category C  Lab review: 06/22/2023: Hemoglobin 13.6, MCV 90, iron saturation 9%, TIBC 427, calcium 9.3, creatinine 0.64  I discussed with the patient that she has low iron saturation which usually indicates slightly decreased oral iron intake.  There is no  indication for IV iron therapy.  I recommend increasing her dietary iron intake.   From this year onwards we will do contrast-enhanced mammograms once a year

## 2023-06-29 NOTE — Progress Notes (Signed)
 HEMATOLOGY-ONCOLOGY TELEPHONE VISIT PROGRESS NOTE  I connected with our patient on 06/29/23 at  1:30 PM EDT by telephone and verified that I am speaking with the correct person using two identifiers.  I discussed the limitations, risks, security and privacy concerns of performing an evaluation and management service by telephone and the availability of in person appointments.  I also discussed with the patient that there may be a patient responsible charge related to this service. The patient expressed understanding and agreed to proceed.   History of Present Illness: Follow-up of breast cancer and iron deficiency anemia  History of Present Illness  her labs of CBC with differential and iron studies with ferritinAmy, a patient with a history of hysterectomy, presents with worsening leg cramps, particularly at night and after work days. The cramps have been so severe that they have disrupted her sleep, causing her to wake up every hour. She also reports fatigue, which she initially attributed to the disrupted sleep, but now suspects may be related to her low iron levels.  Alezandra's urogynecologist had suggested that her leg cramps might be related to low iron levels, prompting her to get her iron saturation and ferritin levels checked. The results showed low iron saturation and ferritin levels on the lower end of the normal range. Ritu is considering starting iron supplements, but her urogynecologist suggested she might need IV iron and advised her to discuss this with her primary care physician.  Robbin has a history of anastrozole use for breast cancer treatment. She reports that her leg cramps did not improve when she switched to exemestane or tamoxifen. She is currently back on anastrozole.  Jamaiya also mentions difficulty scheduling her contrast mammogram, which is due in a couple of weeks. She has been told that the schedule for April has not been released yet, causing her stress as she tries to arrange her  own schedule.    Oncology History  Malignant neoplasm of upper-outer quadrant of right breast in female, estrogen receptor positive (HCC)  03/23/2018 Genetic Testing   [facility] Genetic Testing Results: Negative, with a variant of uncertain significance in POLE.  Genes Analyzed: APC, ATM, AXIN2, BARD1, BMPR1A, BRCA1, BRCA2, BRIP1, CDH1, CDK4, CDKN2A, CHEK2, EPCAM (large rearrangement only), HOXB13 (sequencing only), GALNT12, MLH1, MSH2, MSH3, MSH6, MUTYH, NBN, NTHL1, PALB2, PMS2, PTEN, RAD51C, RAD51D, RNF43, RPS20, SMAD4, STK11, TP52. Sequencing was performed for select regions of POLE and POLD1, and large rearrangement analysis was performed for select regions of GERM1.   07/08/2020 Initial Diagnosis   Screening mammogram showed right breast calcifications. Diagnostic mammogram showed right breast calcifications spanning 4.1cm and no abnormal right axillary lymph nodes. 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   07/24/2020 - 11/15/2020 Neo-Adjuvant Chemotherapy   Taxotere, Carbo, Herceptin, Perjeta given every three weeks x 6   12/05/2020 - 07/24/2021 Chemotherapy   Maintenance Herceptin/Perjeta every three weeks for one year       12/11/2020 Surgery   Right breast lumpectomy Dwain Sarna): Scattered foci of high-grade DCIS with calcifications, no invasive cancer, pathologic complete response, margins negative, 0/3 lymph nodes negative, previously ER 80%, PR 40%, HER2 positive, Ki-67 25%   12/11/2020 Cancer Staging   Staging form: Breast, AJCC 8th Edition - Pathologic stage from 12/11/2020: No Stage Recommended (ypT0, pN0, cM0) - Signed by Loa Socks, NP on 03/19/2021 Stage prefix: Post-therapy   12/20/2020 Surgery   Reconstruction with Dr. Leta Baptist   02/04/2021 -  03/25/2021 Radiation Therapy   Adjuvant radiation   06/2021 -  Anti-estrogen oral therapy   Anastrozole daily     REVIEW OF SYSTEMS:    Constitutional: Denies fevers, chills or abnormal weight loss All other systems were reviewed with the patient and are negative. Observations/Objective:     Assessment Plan:  Malignant neoplasm of upper-outer quadrant of right breast in female, estrogen receptor positive (HCC) 07/08/2020: Screening mammogram showed right breast calcifications. Diagnostic mammogram showed right breast calcifications spanning 4.1cm and no abnormal right axillary lymph nodes. 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 Genetics done in 2019: Negative   Treatment plan: 1. Neoadjuvant chemotherapy with TCH Perjeta 6 cycles completed 11/15/2020 followed by Herceptin Perjeta maintenance for 1 year 2. 12/11/2020:Right breast lumpectomy Dwain Sarna): Scattered foci of high-grade DCIS with calcifications, no invasive cancer, pathologic complete response, margins negative, 0/3 lymph nodes negative, previously ER 80%, PR 40%, HER2 positive, Ki-67 25% 3. Followed by adjuvant radiation therapy  started 02/04/2021 4.  Followed by adjuvant antiestrogen therapy switched back to anastrozole from tamoxifen. 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 URCC nausea study ---------------------------------------------------------------------------------------------------------------------------------------   Breast Cancer Surveillance: 1. Breast MRI: 12/28/2022 benign, breast density category C 2. DEXA scan. 12/28/2022: T score -2.4: Recommended bisphosphonate therapy.  I recommend Zometa once a year starting next week.  Labs were done by her primary care physician on 12/29/2022 which showed normal creatinine 0.73 and normal calcium 9.2.   3. Mammograms: 01/15/2023 right breast: Benign breast density category C, bilateral mammograms to be done April 2025 4.  Signatera for MRD monitoring: Negative 5.  Breast MRI 12/28/2022:  Benign breast density category C  Lab review: 06/22/2023: Hemoglobin 13.6, MCV 90, iron saturation 9%, TIBC 427, calcium 9.3, creatinine 0.64, Ferritin 15 2018 Colonoscopy: Normal Based on symptoms of leg cramps and profound fatigue I recommended IV iron therapy.  Will schedule 3 doses of IV iron Venofer at the [facility].  Our plan is to check labs in 3 months and follow-up after that.  If she has labs to be drawn for other reasons we will combine them to the other labs that she needs to be drawn.   Breast cancer surveillance: Contrast-enhanced mammogram is been ordered but apparently she has not been scheduled because of logistical issues at the breast center. --------------------------------- Assessment and Plan Assessment & Plan Iron deficiency Iron deficiency likely contributing to nocturnal leg cramps and fatigue. Possible malabsorption due to hysterectomy. IV iron therapy considered for rapid replenishment. - Administer three doses of Venofer IV iron over three weeks at the [facility] infusion center. - Check iron levels in three months to assess response. - Coordinate lab draws to minimize venipuncture.  Breast cancer follow-up Mammogram with contrast scheduling delayed due to submission schedule issues. Upper management involvement required. - Contact upper management to resolve scheduling issues for the mammogram with contrast. - Ensure she receives a call to schedule the mammogram appointment.      I discussed the assessment and treatment plan with the patient. The patient was provided an opportunity to ask questions and all were answered. The patient agreed with the plan and demonstrated an understanding of the instructions. The patient was advised to call back or seek an in-person evaluation if the symptoms worsen or if the condition fails to improve as anticipated.   I provided 20 minutes of non-face-to-face time during  this encounter.  This includes time for  charting and coordination of care   Tamsen Meek, MD

## 2023-06-30 ENCOUNTER — Other Ambulatory Visit: Payer: Self-pay | Admitting: Hematology and Oncology

## 2023-07-01 ENCOUNTER — Encounter: Payer: Self-pay | Admitting: Family Medicine

## 2023-07-01 ENCOUNTER — Encounter: Payer: Self-pay | Admitting: Hematology and Oncology

## 2023-07-01 ENCOUNTER — Encounter: Payer: BC Managed Care – PPO | Admitting: Physical Therapy

## 2023-07-01 ENCOUNTER — Encounter

## 2023-07-02 ENCOUNTER — Encounter: Payer: Self-pay | Admitting: Obstetrics and Gynecology

## 2023-07-02 ENCOUNTER — Ambulatory Visit: Admitting: Obstetrics and Gynecology

## 2023-07-02 VITALS — BP 120/82 | HR 78

## 2023-07-02 DIAGNOSIS — M62838 Other muscle spasm: Secondary | ICD-10-CM

## 2023-07-02 DIAGNOSIS — R102 Pelvic and perineal pain: Secondary | ICD-10-CM

## 2023-07-02 MED ORDER — BUPIVACAINE HCL 0.25 % IJ SOLN
9.0000 mL | Freq: Once | INTRAMUSCULAR | Status: AC
  Administered 2023-07-08: 9 mL

## 2023-07-02 MED ORDER — TRIAMCINOLONE ACETONIDE 40 MG/ML IJ SUSP
40.0000 mg | Freq: Once | INTRAMUSCULAR | Status: AC
  Administered 2023-07-08: 40 mg via INTRAMUSCULAR

## 2023-07-02 NOTE — Progress Notes (Signed)
 [name] is a 53 y.o. female who presents for levator trigger point injection.   Vitals:   07/02/23 1502  BP: 120/82  Pulse: 78      Indication(s): Levator spasm and dyspareunia  Informed Consent:  The alternatives, risks and benefits of the procedure were explained to the patient. Risks including, but not limited to discomfort, pain, bleeding, infection, injury to nearby structures, inability to perform the procedure, failure of the procedure were discussed.  All questions were answered and the patient elected to proceed.  Procedure:   The patient was positioned in dorsal lithotomy position.  The vaginal tissues were prepped with Hibiclens solution.  An injection of 10cc of a mixture of 90% anesthetic (0.25% Bupivacaine) and 10% 40mg /ml Triamcinolone acetomide (Kenalog) was performed in multiple site at the perineum and in the left levator muscles, a total of 3 injection sites.  Pressure was held over bleeding areas until good hemostasis was achieved.   The patient tolerated the procedure well with no apparent complications.  Marguerita Beards, MD

## 2023-07-08 ENCOUNTER — Encounter: Payer: Self-pay | Admitting: Physical Therapy

## 2023-07-08 ENCOUNTER — Ambulatory Visit: Payer: BC Managed Care – PPO | Admitting: Physical Therapy

## 2023-07-08 DIAGNOSIS — N941 Unspecified dyspareunia: Secondary | ICD-10-CM | POA: Diagnosis not present

## 2023-07-08 DIAGNOSIS — M62838 Other muscle spasm: Secondary | ICD-10-CM

## 2023-07-08 DIAGNOSIS — R293 Abnormal posture: Secondary | ICD-10-CM

## 2023-07-08 DIAGNOSIS — M6281 Muscle weakness (generalized): Secondary | ICD-10-CM | POA: Diagnosis not present

## 2023-07-08 NOTE — Therapy (Signed)
 OUTPATIENT PHYSICAL THERAPY FEMALE PELVIC TREATMENT   Patient Name: Sandy Goodwin MRN: [id number] DOB:[birth year], 53 y.o., female Today's Date: 07/08/2023   END OF SESSION:  PT End of Session - 07/08/23 1153     Visit Number 24    Date for PT Re-Evaluation 08/04/23    Authorization Type BC[employer]    PT Start Time 1145    PT Stop Time 1225    PT Time Calculation (min) 40 min    Activity Tolerance Patient tolerated treatment well    Behavior During Therapy [facility] for tasks assessed/performed              Past Medical History:  Diagnosis Date   Full incontinence-feces    History of anemia    History of cancer chemotherapy    right breast;   neo-adjuvent 07-24-2020  to 11-15-2020  and 12-05-2020  to 07-24-2021   History of external beam radiation therapy    right reast 02-04-2021  to 03-25-2021   History of gastric ulcer    remote hx -- resolved   Hypothyroidism, postsurgical    followed by pcp;     02/ 2012  s/p  right hemithyroidectomy  (follicular adenoma)   Malignant neoplasm of upper-outer quadrant of right breast in female, estrogen receptor positive (HCC) 06/2020   oncologist--- dr Pamelia Hoit;  DCIS high grade right breast , neo-adjuvant chemo completed 11-15-2020,  s/p right breast lumpectomy w/ node dissection on 12-11-2020, then chemo completed 07-24-2021 and radiation completed 03-25-2021   Polyarthralgia    PONV (postoperative nausea and vomiting)    With first surgery, none since   Prolapse of vaginal vault after hysterectomy    and posterior prolapse   RA (rheumatoid arthritis) (HCC)    rheumatologsit--- dr Dierdre Forth;  no treatment   Tachycardia    Tachycardia   Past Surgical History:  Procedure Laterality Date   ANTERIOR AND POSTERIOR REPAIR WITH SACROSPINOUS FIXATION N/A 01/26/2022   Procedure: POSTERIOR REPAIR WITH SACROSPINOUS FIXATION;  Surgeon: Marguerita Beards, MD;  Location: [facility] [place];  Service: Gynecology;  Laterality: N/A;  total time  requested is 2 hours   APPENDECTOMY  08/2006   exploratory laparatomy w/ appendectomy (ruptured) and repair bowel   BREAST BIOPSY Right 07/30/2022   MM RT BREAST BX W LOC DEV 1ST LESION IMAGE BX SPEC STEREO GUIDE 07/30/2022 [facility]   BREAST LUMPECTOMY WITH RADIOACTIVE SEED AND SENTINEL LYMPH NODE BIOPSY Right 12/11/2020   Procedure: RIGHT BREAST LUMPECTOMY WITH RADIOACTIVE SEED X2 AND RIGHT AXILLARY SENTINEL LYMPH NODE BIOPSY;  Surgeon: Emelia Loron, MD;  Location: [facility];  Service: General;  Laterality: Right;   BREAST RECONSTRUCTION Right 12/20/2020   Procedure: RIGHT ONCOPLASTIC RECONSTRUCTION;  Surgeon: Glenna Fellows, MD;  Location: [facility];  Service: Plastics;  Laterality: Right;   BREAST REDUCTION SURGERY Left 12/20/2020   Procedure: LEFT BREAST REDUCTION;  Surgeon: Glenna Fellows, MD;  Location: [facility];  Service: Plastics;  Laterality: Left;   COLONOSCOPY N/A 06/15/2016   Procedure: COLONOSCOPY;  Surgeon: Malissa Hippo, MD;  [facility];  Service: Endoscopy;  Laterality: N/A;  845   DILATION AND CURETTAGE OF UTERUS  06/03/2011   w/ suction for miscarriage   EXCISION OF BREAST BIOPSY Right 1999   INCISION AND DRAINAGE OF WOUND Right 01/10/2021   Procedure: INCISION AND DRAINAGE OF RIGHT AXILLA;  Surgeon: Glenna Fellows, MD;  Location:  SURGERY CENTER;  Service: Plastics;  Laterality: Right;  OVUM / OOCYTE RETRIEVAL     2010  and 2012   PERINEOPLASTY N/A 01/26/2022   Procedure: PERINEORRHAPY;  Surgeon: Marguerita Beards, MD;  Location: [facility];  Service: Gynecology;  Laterality: N/A;   PERINEOPLASTY N/A 02/19/2022   Procedure: PERINEOPLASTY revision;  Surgeon: Marguerita Beards, MD;  Location: [facility];  Service: Gynecology;  Laterality: N/A;   PORTACATH PLACEMENT Left 07/23/2020   Procedure: INSERTION PORT-A-CATH;  Surgeon: Emelia Loron, MD;  Location: [facility];  Service: General;  Laterality: Left;   THYROID LOBECTOMY Right 05/2010   @[facility]   VAGINAL HYSTERECTOMY  03/21/2019   @ [facility] by dr Mora Appl;   w/ Rogelio Seen culdoplasty   Patient Active Problem List   Diagnosis Date Noted   Iron deficiency anemia 06/29/2023   Osteopenia 12/31/2022   Prolapse of posterior vaginal wall 01/26/2022   Uterine prolapse 03/20/2021   Polyarthralgia 03/20/2021   Port-A-Cath in place 02/06/2021   Genetic testing 07/17/2020   Malignant neoplasm of upper-outer quadrant of right breast in female, estrogen receptor positive (HCC) 07/12/2020   Change in bowel habits 06/12/2016   Mixed hyperlipidemia 01/07/2016   Hypothyroidism, unspecified 01/07/2016   Allergic rhinitis 04/02/2015   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    PCP: Richardean Chimera, MD  REFERRING PROVIDER: Selmer Dominion, NP  REFERRING DIAG: [phone] (ICD-10-CM) - Post-operative state  THERAPY DIAG:  Muscle weakness (generalized)  Abnormal posture  Rationale for Evaluation and Treatment: Rehabilitation  ONSET DATE: noticed at last pelvic exam in [place]  SUBJECTIVE:                                                                                                                                                                                           SUBJECTIVE STATEMENT: Pt reports pain is improving with injections now, does still feel prolapse about the same and urine leakage about the same.   PAIN:  Are you having pain? No - at this time.  2/10 at area at vaginal opening with intercourse   PRECAUTIONS: Other: breast CA - not currently getting treatments   WEIGHT BEARING RESTRICTIONS: No  FALLS:  Has patient fallen in last 6 months? No  LIVING ENVIRONMENT: Lives with: lives with their family   OCCUPATION: PA  PLOF: Independent  PATIENT GOALS: to have no pain with sex  PERTINENT HISTORY:   Malignant neoplasm of  upper-outer quadrant of right breast in female, estrogen receptor positive (HCC)  Surgery: s/p Posterior repair, sacrospinous ligament fixation, perineorrhaphy, anal sphincteroplasty on 01/26/22 S/p EUA and revision perineoplasty on 02/21/22.  Sexual abuse: No  BOWEL MOVEMENT:  Pain with bowel movement: No Type of bowel movement: normal for pt Fully empty rectum: Yes:   Leakage: No Pads: No Fiber supplement: No  URINATION: Pain with urination: No Fully empty bladder: Yes:   Stream: Strong Urgency: No Frequency: not quicker than every 2 hours Leakage:  none Pads: No  INTERCOURSE: Pain with intercourse: Initial Penetration and During Penetration Ability to have vaginal penetration:  Yes: but painful Climax: not really due to pain Marinoff Scale: 2/3  PREGNANCY: Vaginal deliveries 2 Tearing Yes: 3rd deg tear posterior with first and anterior second  C-section deliveries 0 Currently pregnant No  PROLAPSE: None   OBJECTIVE:   DIAGNOSTIC FINDINGS:    COGNITION: Overall cognitive status: Within functional limits for tasks assessed     SENSATION: Light touch: Appears intact Proprioception: Appears intact  MUSCLE LENGTH: Hamstrings and adductors limited by 25%    POSTURE: rounded shoulders, forward head, and flexed trunk   LUMBARAROM/PROM:  A/PROM A/PROM  eval  Flexion Limited by 25%  Extension WFL  Right lateral flexion WFL  Left lateral flexion WFL  Right rotation WFL  Left rotation WFL   (Blank rows = not tested)  LOWER EXTREMITY ROM:  WFL  LOWER EXTREMITY MMT:  Hip abduction 3+/5 bil, all other 4/5, knees 5/5 PALPATION:   General  no TTP externally                 External Perineal Exam mild TTP at posterior vaginal opening externally 10/08/22 - TTP at bil labia minora however reports she needs to add her cream externally to help with tissue mobility and moisture.                              Internal Pelvic Floor bil bulbocavernosus, at  vaginal opening at clock face 8-4 locations, no TTP at deep layers except near coccyx. perineal scar tissue restriction/tenderness,  10/08/22 - Lt bulbocavernosus, tension most felt at 4-5 on clock face of vaginal opening, also TTP throughout Lt pubococcygeus possible puborectalis with trigger points noted. Improving perineal scar mobility with mild restrictions now.   Patient confirms identification and approves PT to assess internal pelvic floor and treatment Yes  PELVIC MMT:   MMT eval 10/08/22  11/26/22  04/29/23  06/28/23   Vaginal 3/5, 8s, 10 reps 3/5, 10s, 10 reps - however focus of training has been improved pain management with decreased tension 4/5 5reps 8s 4/5, 10s, 7 reps 4/5, 8s, 8 reps  Internal Anal Sphincter       External Anal Sphincter       Puborectalis       Diastasis Recti       (Blank rows = not tested)        TONE: Pelvic floor atrophy   PROLAPSE: Not seen in hooklying  11/12/22 - anterior wall laxity with cough in hooklying and standing.   TODAY'S TREATMENT:  DATE:  05/20/23  Patient consented to internal pelvic floor treatment vaginally this date for manual work - at vaginal opening Rt lateral side focus of treatment with gentle stretching for decreased pain with penetration.  Continued   improvement noted. Pt did have muscle twitching noted here and achy pain reported with stretching but tolerated better than last session. Mild release noted but not resolved. Did have more external TTP at Rt bulbocavernosus just outside of vaginal opening and pt reports this feels like it is radiating "kind of wrapping into vagina". And with location pt pointed to does demonstrate internally at bulbocavernosus area. Manual with gentle stretching and soft tissue release work complete here as well.    06/28/23:  Manual at vaginal opening LT/midline/Rt  bulbocavernous. Did have greatly improved mobility with less pain compared to previous session. Pt reports she has been getting trigger point injections which have been helping too. With this MD states continued stretching PT to help improve tissue mobility. Pt is still using wand with vibration and helps. During manual PT educated to practice diaphragmatic breathing with urination and pelvic mobility to fully empty bladder, standing when done then return to sitting to insure all urine has emptied. Pt agreed.  With internal - once relaxation improved and pain decreased progressed to 3x10 pelvic floor contractions, x10 5-8s holds, 2x10 quick flicks without pain.   07/08/23  All exercises cued for exhale and pelvic floor coordination for decreased strain at pelvic floor/prolapse  Hooklying marching yellow loop 2x10 Hooklying hip abduction yellow loop 2x10 2x10 bridges Seated bil shoulder horizontal abduction green band 2x10 Squats 10# 2x10 Bird dogs 2x10 Farmer's carry 10# 750' each hand  PATIENT EDUCATION:  Education details: [id number] Person educated: Patient Education method: Explanation, Demonstration, Tactile cues, Verbal cues, and Handouts Education comprehension: verbalized understanding and returned demonstration  HOME EXERCISE PROGRAM: [id number]    ASSESSMENT:  CLINICAL IMPRESSION: Patient presents for treatment today. Pt session focused on pelvic floor coordination and pressure management with strengthening exercises to decreased prolapse symptoms and improve pelvic floor strength. Pt tolerated well and denied pressure symptoms today, does benefit from cues for coordination and core activation. Pt mildly limited today with cramping in legs but reports she is going to get iron infusions for these and being followed already for this. With modifications and position changes able to complete exercises. Pt would benefit from additional PT to further address deficits.    OBJECTIVE  IMPAIRMENTS: decreased coordination, decreased endurance, decreased mobility, decreased strength, increased fascial restrictions, increased muscle spasms, impaired flexibility, improper body mechanics, postural dysfunction, and pain.   ACTIVITY LIMITATIONS: sitting and intercourse, medical assessments  with internal needs  PARTICIPATION LIMITATIONS: interpersonal relationship and community activity  PERSONAL FACTORS: 1 comorbidity: medical history  are also affecting patient's functional outcome.   REHAB POTENTIAL: Good  CLINICAL DECISION MAKING: Stable/uncomplicated  EVALUATION COMPLEXITY: Low   GOALS: Goals reviewed with patient? Yes  SHORT TERM GOALS: Target date: 08/21/22  Pt to be I with advanced HEP.  Baseline: Goal status: MET  2.  Pt will report no more than 5/10 pain due to improvements in posture, strength, and muscle length  Baseline:  Goal status: MET   LONG TERM GOALS: Target date: 10/23/22  Pt to be I with advanced HEP.  Baseline:  Goal status: MET 2.  Pt will report no more than 2/10 pain due to improvements in posture, strength, and muscle length  Baseline:  Goal status: on going  3.  Pt to report improved  tolerance to vaginal penetration for improved ability to be able to climax at least 75% of the instance of intercourse.  Baseline:  Goal status: on going  4.  Pt to be I with manual self stretching of perineal body/vaginal opening to improve tissue mobility and decrease pain at home.  Baseline:  Goal status: MET  5.  Pt to demonstrate at least 4/5 pelvic floor strength with ability to fully relax post contraction for improved pelvic stability and decreased strain at pelvic floor/ decrease leakage.  Baseline:  Goal status: MET  6.  Pt to demonstrate at least 5/5 bil hip strength with good coordination of pelvic floor and breathing  for improved pelvic stability and functional squats without leakage.   Baseline:  Goal status: MET  7.  Pt to  demonstrate pelvic floor contraction of at least 4/5 strength for 10s and be able to withhold during cough in standing to decreased strain at pelvic floor. Baseline:  Goal status: MET  8. Pt to demonstrate improved coordination of pelvic floor and breathing mechanics with 10# squat with appropriate synergistic patterns to decrease pain and leakage at least 75% of the time.      Baseline:  Goal status: MET  PT FREQUENCY: 1-2x/week  PT DURATION:  10 sessions  PLANNED INTERVENTIONS: Therapeutic exercises, Therapeutic activity, Neuromuscular re-education, Balance training, Gait training, Patient/Family education, Self Care, Aquatic Therapy, Dry Needling, Spinal mobilization, Cryotherapy, Moist heat, scar mobilization, Taping, Biofeedback, and Manual therapy  PLAN FOR NEXT SESSION: manual work at perineal mobility, core and hip strengthening, coordination of pelvic floor and breathing mechanics   Otelia Sergeant, PT, DPT 07/08/2510:29 PM

## 2023-07-09 ENCOUNTER — Ambulatory Visit: Admitting: Obstetrics and Gynecology

## 2023-07-13 ENCOUNTER — Other Ambulatory Visit: Payer: Self-pay | Admitting: *Deleted

## 2023-07-13 ENCOUNTER — Encounter: Payer: Self-pay | Admitting: Hematology and Oncology

## 2023-07-13 DIAGNOSIS — Z170 Estrogen receptor positive status [ER+]: Secondary | ICD-10-CM

## 2023-07-13 NOTE — Progress Notes (Signed)
 Orders placed for [employer] [facility].

## 2023-07-14 ENCOUNTER — Encounter: Payer: Self-pay | Admitting: Hematology and Oncology

## 2023-07-15 ENCOUNTER — Encounter: Payer: BC Managed Care – PPO | Admitting: Physical Therapy

## 2023-07-15 ENCOUNTER — Ambulatory Visit

## 2023-07-15 VITALS — BP 131/8 | HR 67 | Temp 97.5°F | Resp 20 | Ht 66.5 in | Wt 183.0 lb

## 2023-07-15 DIAGNOSIS — D509 Iron deficiency anemia, unspecified: Secondary | ICD-10-CM | POA: Diagnosis not present

## 2023-07-15 MED ORDER — SODIUM CHLORIDE 0.9 % IV SOLN
300.0000 mg | Freq: Once | INTRAVENOUS | Status: AC
  Administered 2023-07-15: 300 mg via INTRAVENOUS
  Filled 2023-07-15: qty 15

## 2023-07-15 NOTE — Progress Notes (Signed)
 Diagnosis: Iron Deficiency Anemia  Provider:  Chilton Greathouse MD  Procedure: IV Infusion  IV Type: Peripheral, IV Location: L Antecubital  Venofer (Iron Sucrose), Dose: 300 mg  Infusion Start Time: 0945  Infusion Stop Time: 1124  Post Infusion IV Care: Observation period completed and Peripheral IV Discontinued  Discharge: Condition: Good, Destination: Home . AVS Provided  Performed by:  Rico Ala, LPN

## 2023-07-16 ENCOUNTER — Ambulatory Visit
Admission: RE | Admit: 2023-07-16 | Discharge: 2023-07-16 | Disposition: A | Discharge status: Home / Self Care | Source: Ambulatory Visit | Attending: Hematology and Oncology | Admitting: Hematology and Oncology

## 2023-07-16 ENCOUNTER — Ambulatory Visit: Admitting: Obstetrics and Gynecology

## 2023-07-16 ENCOUNTER — Encounter: Payer: Self-pay | Admitting: Obstetrics and Gynecology

## 2023-07-16 VITALS — BP 125/85 | HR 71

## 2023-07-16 DIAGNOSIS — N941 Unspecified dyspareunia: Secondary | ICD-10-CM | POA: Diagnosis not present

## 2023-07-16 DIAGNOSIS — Z170 Estrogen receptor positive status [ER+]: Secondary | ICD-10-CM

## 2023-07-16 DIAGNOSIS — M62838 Other muscle spasm: Secondary | ICD-10-CM | POA: Diagnosis not present

## 2023-07-16 DIAGNOSIS — R102 Pelvic and perineal pain: Secondary | ICD-10-CM

## 2023-07-16 MED ORDER — IOPAMIDOL (ISOVUE-370) INJECTION 76%: 100.0000 mL | Freq: Once | INTRAVENOUS | Status: DC | PRN

## 2023-07-16 NOTE — Progress Notes (Signed)
 [name] is a 53 y.o. female who presents for levator trigger point injection #6.   Vitals:   07/16/23 1007  BP: 125/85  Pulse: 71     Indication(s): Levator spasm and dyspareunia  Informed Consent:  The alternatives, risks and benefits of the procedure were explained to the patient. Risks including, but not limited to discomfort, pain, bleeding, infection, injury to nearby structures, inability to perform the procedure, failure of the procedure were discussed.  All questions were answered and the patient elected to proceed.  Procedure:   The patient was positioned in dorsal lithotomy position.  The vaginal tissues were prepped with Hibiclens solution.  An injection of 10cc of a mixture of 90% anesthetic (0.25% Bupivacaine) and 10% 40mg /ml Triamcinolone acetomide (Kenalog) was performed in multiple site at the perineum and in the right and left levator muscles, a total of 4 injection sites.  Pressure was held over bleeding areas until good hemostasis was achieved.   The patient tolerated the procedure well with no apparent complications.  Return 1 month for follow up  Marguerita Beards, MD

## 2023-07-22 ENCOUNTER — Ambulatory Visit
Admission: RE | Admit: 2023-07-22 | Discharge: 2023-07-22 | Disposition: A | Discharge status: Home / Self Care | Source: Ambulatory Visit | Attending: Hematology and Oncology | Admitting: Hematology and Oncology

## 2023-07-22 ENCOUNTER — Encounter: Payer: BC Managed Care – PPO | Admitting: Physical Therapy

## 2023-07-22 MED ORDER — IOPAMIDOL (ISOVUE-370) INJECTION 76%: 100.0000 mL | Freq: Once | INTRAVENOUS | Status: AC | PRN

## 2023-07-23 ENCOUNTER — Ambulatory Visit

## 2023-07-23 VITALS — BP 114/77 | HR 72 | Temp 97.7°F | Resp 16 | Ht 66.5 in | Wt 180.4 lb

## 2023-07-23 DIAGNOSIS — D509 Iron deficiency anemia, unspecified: Secondary | ICD-10-CM

## 2023-07-23 MED ORDER — SODIUM CHLORIDE 0.9 % IV SOLN
300.0000 mg | Freq: Once | INTRAVENOUS | Status: AC
  Administered 2023-07-23: 300 mg via INTRAVENOUS
  Filled 2023-07-23: qty 15

## 2023-07-23 NOTE — Progress Notes (Signed)
 Diagnosis: Acute Anemia  Provider:  Chilton Greathouse MD  Procedure: IV Infusion  IV Type: Peripheral, IV Location: L Hand  Venofer (Iron Sucrose), Dose: 300 mg  Infusion Start Time: 0946  Infusion Stop Time: 1126  Post Infusion IV Care: Observation period completed and Peripheral IV Discontinued  Discharge: Condition: Good, Destination: Home . AVS Declined  Performed by:  Nat Math, RN

## 2023-07-29 ENCOUNTER — Ambulatory Visit: Payer: Self-pay | Attending: Obstetrics and Gynecology | Admitting: Physical Therapy

## 2023-07-29 DIAGNOSIS — M62838 Other muscle spasm: Secondary | ICD-10-CM | POA: Insufficient documentation

## 2023-07-29 DIAGNOSIS — R293 Abnormal posture: Secondary | ICD-10-CM | POA: Insufficient documentation

## 2023-07-29 DIAGNOSIS — R279 Unspecified lack of coordination: Secondary | ICD-10-CM | POA: Insufficient documentation

## 2023-07-29 DIAGNOSIS — M6281 Muscle weakness (generalized): Secondary | ICD-10-CM | POA: Insufficient documentation

## 2023-08-02 ENCOUNTER — Ambulatory Visit

## 2023-08-02 VITALS — BP 117/83 | HR 65 | Temp 98.0°F | Resp 16 | Ht 66.0 in | Wt 181.3 lb

## 2023-08-02 DIAGNOSIS — D509 Iron deficiency anemia, unspecified: Secondary | ICD-10-CM | POA: Diagnosis not present

## 2023-08-02 MED ORDER — SODIUM CHLORIDE 0.9 % IV SOLN
300.0000 mg | Freq: Once | INTRAVENOUS | Status: AC
  Administered 2023-08-02: 300 mg via INTRAVENOUS
  Filled 2023-08-02: qty 15

## 2023-08-02 NOTE — Progress Notes (Signed)
 Diagnosis: Acute Anemia  Provider:  Phyllis Breeze MD  Procedure: IV Infusion  IV Type: Peripheral, IV Location: Left wrist  Venofer  (Iron  Sucrose), Dose: 300 mg  Infusion Start Time: 0922  Infusion Stop Time: 1102  Post Infusion IV Care: Observation period completed and Peripheral IV Discontinued  Discharge: Condition: Good, Destination: Home . AVS Declined  Performed by:  Shirly Dow, RN

## 2023-08-04 ENCOUNTER — Ambulatory Visit: Payer: Self-pay | Admitting: Physical Therapy

## 2023-08-04 DIAGNOSIS — R293 Abnormal posture: Secondary | ICD-10-CM

## 2023-08-04 DIAGNOSIS — M62838 Other muscle spasm: Secondary | ICD-10-CM | POA: Diagnosis present

## 2023-08-04 DIAGNOSIS — M6281 Muscle weakness (generalized): Secondary | ICD-10-CM

## 2023-08-04 DIAGNOSIS — R279 Unspecified lack of coordination: Secondary | ICD-10-CM | POA: Diagnosis present

## 2023-08-04 NOTE — Therapy (Signed)
 OUTPATIENT PHYSICAL THERAPY FEMALE PELVIC TREATMENT   Patient Name: Sandy Goodwin MRN: [id number] DOB:[birth year], 53 y.o., female Today's Date: 08/04/2023   END OF SESSION:  PT End of Session - 08/04/23 0753     Visit Number 25    Date for PT Re-Evaluation 10/04/23    Authorization Type [employer]    PT Start Time 0800    PT Stop Time 0842    PT Time Calculation (min) 42 min    Activity Tolerance Patient tolerated treatment well    Behavior During Therapy [facility] for tasks assessed/performed              Past Medical History:  Diagnosis Date   Full incontinence-feces    History of anemia    History of cancer chemotherapy    right breast;   neo-adjuvent 07-24-2020  to 11-15-2020  and 12-05-2020  to 07-24-2021   History of external beam radiation therapy    right reast 02-04-2021  to 03-25-2021   History of gastric ulcer    remote hx -- resolved   Hypothyroidism, postsurgical    followed by pcp;     02/ 2012  s/p  right hemithyroidectomy  (follicular adenoma)   Malignant neoplasm of upper-outer quadrant of right breast in female, estrogen receptor positive (HCC) 06/2020   oncologist--- dr Lee Public;  DCIS high grade right breast , neo-adjuvant chemo completed 11-15-2020,  s/p right breast lumpectomy w/ node dissection on 12-11-2020, then chemo completed 07-24-2021 and radiation completed 03-25-2021   Polyarthralgia    PONV (postoperative nausea and vomiting)    With first surgery, none since   Prolapse of vaginal vault after hysterectomy    and posterior prolapse   RA (rheumatoid arthritis) (HCC)    rheumatologsit--- dr Ebbie Goldmann;  no treatment   Tachycardia    Tachycardia   Past Surgical History:  Procedure Laterality Date   ANTERIOR AND POSTERIOR REPAIR WITH SACROSPINOUS FIXATION N/A 01/26/2022   Procedure: POSTERIOR REPAIR WITH SACROSPINOUS FIXATION;  Surgeon: Arma Lamp, MD;  Location: [facility];  Service: Gynecology;  Laterality: N/A;  total time  requested is 2 hours   APPENDECTOMY  08/2006   exploratory laparatomy w/ appendectomy (ruptured) and repair bowel   BREAST BIOPSY Right 07/30/2022   MM RT BREAST BX W LOC DEV 1ST LESION IMAGE BX SPEC STEREO GUIDE 07/30/2022 [facility]   BREAST LUMPECTOMY WITH RADIOACTIVE SEED AND SENTINEL LYMPH NODE BIOPSY Right 12/11/2020   Procedure: RIGHT BREAST LUMPECTOMY WITH RADIOACTIVE SEED X2 AND RIGHT AXILLARY SENTINEL LYMPH NODE BIOPSY;  Surgeon: Enid Harry, MD;  Location: [facility];  Service: General;  Laterality: Right;   BREAST RECONSTRUCTION Right 12/20/2020   Procedure: RIGHT ONCOPLASTIC RECONSTRUCTION;  Surgeon: Alger Infield, MD;  Location: [facility];  Service: Plastics;  Laterality: Right;   BREAST REDUCTION SURGERY Left 12/20/2020   Procedure: LEFT BREAST REDUCTION;  Surgeon: Alger Infield, MD;  Location: [facility];  Service: Plastics;  Laterality: Left;   COLONOSCOPY N/A 06/15/2016   Procedure: COLONOSCOPY;  Surgeon: Ruby Corporal, MD;  [facility];  Service: Endoscopy;  Laterality: N/A;  845   DILATION AND CURETTAGE OF UTERUS  06/03/2011   w/ suction for miscarriage   EXCISION OF BREAST BIOPSY Right 1999   INCISION AND DRAINAGE OF WOUND Right 01/10/2021   Procedure: INCISION AND DRAINAGE OF RIGHT AXILLA;  Surgeon: Alger Infield, MD;  Location: [facility];  Service: Plastics;  Laterality: Right;  OVUM / OOCYTE RETRIEVAL     2010  and 2012   PERINEOPLASTY N/A 01/26/2022   Procedure: PERINEORRHAPY;  Surgeon: Arma Lamp, MD;  Location: [facility];  Service: Gynecology;  Laterality: N/A;   PERINEOPLASTY N/A 02/19/2022   Procedure: PERINEOPLASTY revision;  Surgeon: Arma Lamp, MD;  Location: [facility];  Service: Gynecology;  Laterality: N/A;   PORTACATH PLACEMENT Left 07/23/2020   Procedure: INSERTION PORT-A-CATH;  Surgeon: Enid Harry, MD;  Location: [facility];  Service: General;  Laterality: Left;   THYROID  LOBECTOMY Right 05/2010   @[facility]   VAGINAL HYSTERECTOMY  03/21/2019   @ [facility] by dr Nickola Baron;   w/ Selwyn Dalton culdoplasty   Patient Active Problem List   Diagnosis Date Noted   Iron  deficiency anemia 06/29/2023   Osteopenia 12/31/2022   Prolapse of posterior vaginal wall 01/26/2022   Uterine prolapse 03/20/2021   Polyarthralgia 03/20/2021   Port-A-Cath in place 02/06/2021   Genetic testing 07/17/2020   Malignant neoplasm of upper-outer quadrant of right breast in female, estrogen receptor positive (HCC) 07/12/2020   Change in bowel habits 06/12/2016   Mixed hyperlipidemia 01/07/2016   Hypothyroidism, unspecified 01/07/2016   Allergic rhinitis 04/02/2015   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    PCP: Leesa Pulling, MD  REFERRING PROVIDER: Zuleta, Kaitlin G, NP  REFERRING DIAG: [phone] (ICD-10-CM) - Post-operative state  THERAPY DIAG:  Muscle weakness (generalized)  Abnormal posture  Other muscle spasm  Rationale for Evaluation and Treatment: Rehabilitation  ONSET DATE: noticed at last pelvic exam in [place]  SUBJECTIVE:                                                                                                                                                                                           SUBJECTIVE STATEMENT: Pressure is about the same - most of the time manageable; long work days or super busy days reports a little worse. Done with trigger point injections has been working well, pain less but tightness in general present. Per MD pt needs to continue stretching throughout superficial and deep layers with PT and wand as able.   PAIN:  Are you having pain? No - at this time.  1/10 at area at vaginal opening with intercourse   PRECAUTIONS: Other: breast CA - not currently getting treatments   WEIGHT BEARING RESTRICTIONS: No  FALLS:  Has patient  fallen in last 6 months? No  LIVING ENVIRONMENT: Lives with: lives with their family   OCCUPATION: PA  PLOF: Independent  PATIENT GOALS: to have no pain with sex  PERTINENT HISTORY:   Malignant neoplasm  of upper-outer quadrant of right breast in female, estrogen receptor positive [facility])  Surgery: s/p Posterior repair, sacrospinous ligament fixation, perineorrhaphy, anal sphincteroplasty on 01/26/22 S/p EUA and revision perineoplasty on 02/21/22.  Sexual abuse: No  BOWEL MOVEMENT: Pain with bowel movement: No Type of bowel movement: normal for pt Fully empty rectum: Yes:   Leakage: No Pads: No Fiber supplement: No  URINATION: Pain with urination: No Fully empty bladder: Yes:   Stream: Strong Urgency: No Frequency: not quicker than every 2 hours Leakage:  none Pads: No  INTERCOURSE: Pain with intercourse: Initial Penetration and During Penetration Ability to have vaginal penetration:  Yes: but painful Climax: not really due to pain Marinoff Scale: 2/3  PREGNANCY: Vaginal deliveries 2 Tearing Yes: 3rd deg tear posterior with first and anterior second  C-section deliveries 0 Currently pregnant No  PROLAPSE: None   OBJECTIVE:   DIAGNOSTIC FINDINGS:    COGNITION: Overall cognitive status: Within functional limits for tasks assessed     SENSATION: Light touch: Appears intact Proprioception: Appears intact  MUSCLE LENGTH: Hamstrings and adductors limited by 25%    POSTURE: rounded shoulders, forward head, and flexed trunk   LUMBARAROM/PROM:  A/PROM A/PROM  eval  Flexion Limited by 25%  Extension WFL  Right lateral flexion WFL  Left lateral flexion WFL  Right rotation WFL  Left rotation WFL   (Blank rows = not tested)  LOWER EXTREMITY ROM:  WFL  LOWER EXTREMITY MMT:  Hip abduction 3+/5 bil, all other 4/5, knees 5/5 PALPATION:   General  no TTP externally                 External Perineal Exam mild TTP at posterior vaginal opening  externally 10/08/22 - TTP at bil labia minora however reports she needs to add her cream externally to help with tissue mobility and moisture.                              Internal Pelvic Floor bil bulbocavernosus, at vaginal opening at clock face 8-4 locations, no TTP at deep layers except near coccyx. perineal scar tissue restriction/tenderness,  10/08/22 - Lt bulbocavernosus, tension most felt at 4-5 on clock face of vaginal opening, also TTP throughout Lt pubococcygeus possible puborectalis with trigger points noted. Improving perineal scar mobility with mild restrictions now.   Patient confirms identification and approves PT to assess internal pelvic floor and treatment Yes  PELVIC MMT:   MMT eval 10/08/22  11/26/22  04/29/23  06/28/23   Vaginal 3/5, 8s, 10 reps 3/5, 10s, 10 reps - however focus of training has been improved pain management with decreased tension 4/5 5reps 8s 4/5, 10s, 7 reps 4/5, 8s, 8 reps  Internal Anal Sphincter       External Anal Sphincter       Puborectalis       Diastasis Recti       (Blank rows = not tested)        TONE: Pelvic floor atrophy   PROLAPSE: Not seen in hooklying  11/12/22 - anterior wall laxity with cough in hooklying and standing.   TODAY'S TREATMENT:  DATE:  06/28/23:  Manual at vaginal opening LT/midline/Rt bulbocavernous. Did have greatly improved mobility with less pain compared to previous session. Pt reports she has been getting trigger point injections which have been helping too. With this MD states continued stretching PT to help improve tissue mobility. Pt is still using wand with vibration and helps. During manual PT educated to practice diaphragmatic breathing with urination and pelvic mobility to fully empty bladder, standing when done then return to sitting to insure all urine has emptied. Pt agreed.  With  internal - once relaxation improved and pain decreased progressed to 3x10 pelvic floor contractions, x10 5-8s holds, 2x10 quick flicks without pain.   07/08/23  All exercises cued for exhale and pelvic floor coordination for decreased strain at pelvic floor/prolapse  Hooklying marching yellow loop 2x10 Hooklying hip abduction yellow loop 2x10 2x10 bridges Seated bil shoulder horizontal abduction green band 2x10 Squats 10# 2x10 Bird dogs 2x10 Farmer's carry 10# 750' each hand  08/04/23 All exercises cued for exhale and pelvic floor coordination for decreased strain at pelvic floor/prolapse  Vibration plate 1 min standing, 2x1 min sitting low setting Pelvic drops on yoga ball - pt able to feel relaxation from contraction but denies bulge x10 Pelvic tilts sitting on ball x10, circles x10 bil No TTP with externally at obturators  2x10 bridges 7#  Opp hand/knee ball press 2x10 Bent rows 7# bil 2x10 Mario punches 7# x10  Farmer's carry 7#+10#     PATIENT EDUCATION:  Education details: [id number] Person educated: Patient Education method: Programmer, multimedia, Facilities manager, Actor cues, Verbal cues, and Handouts Education comprehension: verbalized understanding and returned demonstration  HOME EXERCISE PROGRAM: [id number]    ASSESSMENT:  CLINICAL IMPRESSION: Patient presents for treatment today. Pt session focused on pelvic floor coordination and pressure management with strengthening exercises to decreased prolapse symptoms and improve pelvic floor strength. Pt tolerated well and denied pressure symptoms today, does benefit from cues for coordination and core activation. Pt reports she has completed injections with MD and MD has encouraged pt to continue PT for stretching and mobility at pelvic floor and strengthening to decreased prolapse symptoms. Pt tolerated session well, reports ~1/10 pain with intercourse, MD did have tension in deeper pelvis as well and started feeling a lot better with  injections here as well as superficial area. Pt denied pressure throughout session but would benefit from additional 4 visits for coordination of pelvic floor and activity to decreased strain at pelvic floor and decreased risk of worsening prolapse. With pt's complex history of hormone (+) cancer, poor tissue mobility and increased healing times with pelvic floor treatments pt has had slow progress but does continue to demonstrate progress toward goals. Pt would benefit from additional PT to further address deficits.    OBJECTIVE IMPAIRMENTS: decreased coordination, decreased endurance, decreased mobility, decreased strength, increased fascial restrictions, increased muscle spasms, impaired flexibility, improper body mechanics, postural dysfunction, and pain.   ACTIVITY LIMITATIONS: sitting and intercourse, medical assessments  with internal needs  PARTICIPATION LIMITATIONS: interpersonal relationship and community activity  PERSONAL FACTORS: 1 comorbidity: medical history  are also affecting patient's functional outcome.   REHAB POTENTIAL: Good  CLINICAL DECISION MAKING: Stable/uncomplicated  EVALUATION COMPLEXITY: Low   GOALS: Goals reviewed with patient? Yes  SHORT TERM GOALS: Target date: 08/21/22  Pt to be I with advanced HEP.  Baseline: Goal status: MET  2.  Pt will report no more than 5/10 pain due to improvements in posture, strength, and muscle length  Baseline:  Goal  status: MET   LONG TERM GOALS: Target date: 10/23/22  Pt to be I with advanced HEP.  Baseline:  Goal status: MET 2.  Pt will report no more than 2/10 pain due to improvements in posture, strength, and muscle length  Baseline:  Goal status: MET  3.  Pt to report improved tolerance to vaginal penetration for improved ability to be able to climax at least 75% of the instance of intercourse.  Baseline:  Goal status: on going  4.  Pt to be I with manual self stretching of perineal body/vaginal opening to  improve tissue mobility and decrease pain at home.  Baseline:  Goal status: MET  5.  Pt to demonstrate at least 4/5 pelvic floor strength with ability to fully relax post contraction for improved pelvic stability and decreased strain at pelvic floor/ decrease leakage.  Baseline:  Goal status: MET  6.  Pt to demonstrate at least 5/5 bil hip strength with good coordination of pelvic floor and breathing  for improved pelvic stability and functional squats without leakage.   Baseline:  Goal status: MET  7.  Pt to demonstrate pelvic floor contraction of at least 4/5 strength for 10s and be able to withhold during cough in standing to decreased strain at pelvic floor. Baseline:  Goal status: MET  8. Pt to demonstrate improved coordination of pelvic floor and breathing mechanics with 10# squat with appropriate synergistic patterns to decrease pain and leakage at least 75% of the time.      Baseline:  Goal status: MET  9. Pt to report no increase in pressure at vaginal area with at least 45 mins of activity due to improved I with pressure management, pelvic floor and core and hip strengthening to decreased strain at prolapse and promotes decreased risk of tissue re-injury.  Baseline:  Goal status: NEW    PT FREQUENCY: 1-2x/week  PT DURATION:  10 sessions  PLANNED INTERVENTIONS: Therapeutic exercises, Therapeutic activity, Neuromuscular re-education, Balance training, Gait training, Patient/Family education, Self Care, Aquatic Therapy, Dry Needling, Spinal mobilization, Cryotherapy, Moist heat, scar mobilization, Taping, Biofeedback, and Manual therapy  PLAN FOR NEXT SESSION: manual work at perineal mobility, core and hip strengthening, coordination of pelvic floor and breathing mechanics   Avie Lemme, PT, DPT 08/03/2508:13 AM

## 2023-08-11 ENCOUNTER — Ambulatory Visit: Payer: Self-pay | Admitting: Physical Therapy

## 2023-08-11 DIAGNOSIS — R293 Abnormal posture: Secondary | ICD-10-CM

## 2023-08-11 DIAGNOSIS — R279 Unspecified lack of coordination: Secondary | ICD-10-CM

## 2023-08-11 DIAGNOSIS — M6281 Muscle weakness (generalized): Secondary | ICD-10-CM

## 2023-08-11 NOTE — Therapy (Addendum)
 " OUTPATIENT PHYSICAL THERAPY FEMALE PELVIC TREATMENT   Patient Name: Sandy Goodwin MRN: [id number] DOB:[birth year], 53 y.o., female Today's Date: 08/11/2023   END OF SESSION:  PT End of Session - 08/11/23 0808     Visit Number 26    Date for PT Re-Evaluation 10/04/23    Authorization Type BCBS    PT Start Time 0804    PT Stop Time 0845    PT Time Calculation (min) 41 min    Activity Tolerance Patient tolerated treatment well    Behavior During Therapy [facility] for tasks assessed/performed              Past Medical History:  Diagnosis Date   Full incontinence-feces    History of anemia    History of cancer chemotherapy    right breast;   neo-adjuvent 07-24-2020  to 11-15-2020  and 12-05-2020  to 07-24-2021   History of external beam radiation therapy    right reast 02-04-2021  to 03-25-2021   History of gastric ulcer    remote hx -- resolved   Hypothyroidism, postsurgical    followed by pcp;     02/ 2012  s/p  right hemithyroidectomy  (follicular adenoma)   Malignant neoplasm of upper-outer quadrant of right breast in female, estrogen receptor positive (HCC) 06/2020   oncologist--- dr odean;  DCIS high grade right breast , neo-adjuvant chemo completed 11-15-2020,  s/p right breast lumpectomy w/ node dissection on 12-11-2020, then chemo completed 07-24-2021 and radiation completed 03-25-2021   Polyarthralgia    PONV (postoperative nausea and vomiting)    With first surgery, none since   Prolapse of vaginal vault after hysterectomy    and posterior prolapse   RA (rheumatoid arthritis) (HCC)    rheumatologsit--- dr mai;  no treatment   Tachycardia    Tachycardia   Past Surgical History:  Procedure Laterality Date   ANTERIOR AND POSTERIOR REPAIR WITH SACROSPINOUS FIXATION N/A 01/26/2022   Procedure: POSTERIOR REPAIR WITH SACROSPINOUS FIXATION;  Surgeon: Marilynne Rosaline SAILOR, MD;  Location: [facility];  Service: Gynecology;  Laterality: N/A;  total time  requested is 2 hours   APPENDECTOMY  08/2006   exploratory laparatomy w/ appendectomy (ruptured) and repair bowel   BREAST BIOPSY Right 07/30/2022   MM RT BREAST BX W LOC DEV 1ST LESION IMAGE BX SPEC STEREO GUIDE 07/30/2022 [facility]   BREAST LUMPECTOMY WITH RADIOACTIVE SEED AND SENTINEL LYMPH NODE BIOPSY Right 12/11/2020   Procedure: RIGHT BREAST LUMPECTOMY WITH RADIOACTIVE SEED X2 AND RIGHT AXILLARY SENTINEL LYMPH NODE BIOPSY;  Surgeon: Ebbie Cough, MD;  Location: [facility];  Service: General;  Laterality: Right;   BREAST RECONSTRUCTION Right 12/20/2020   Procedure: RIGHT ONCOPLASTIC RECONSTRUCTION;  Surgeon: Arelia Filippo, MD;  Location: [facility];  Service: Plastics;  Laterality: Right;   BREAST REDUCTION SURGERY Left 12/20/2020   Procedure: LEFT BREAST REDUCTION;  Surgeon: Arelia Filippo, MD;  Location: [facility];  Service: Plastics;  Laterality: Left;   COLONOSCOPY N/A 06/15/2016   Procedure: COLONOSCOPY;  Surgeon: Claudis RAYMOND Rivet, MD;  [facility];  Service: Endoscopy;  Laterality: N/A;  845   DILATION AND CURETTAGE OF UTERUS  06/03/2011   w/ suction for miscarriage   EXCISION OF BREAST BIOPSY Right 1999   INCISION AND DRAINAGE OF WOUND Right 01/10/2021   Procedure: INCISION AND DRAINAGE OF RIGHT AXILLA;  Surgeon: Arelia Filippo, MD;  Location: [facility];  Service: Plastics;  Laterality: Right;  OVUM / OOCYTE RETRIEVAL     2010  and 2012   PERINEOPLASTY N/A 01/26/2022   Procedure: PERINEORRHAPY;  Surgeon: Marilynne Rosaline SAILOR, MD;  Location: [facility];  Service: Gynecology;  Laterality: N/A;   PERINEOPLASTY N/A 02/19/2022   Procedure: PERINEOPLASTY revision;  Surgeon: Marilynne Rosaline SAILOR, MD;  Location: [facility];  Service: Gynecology;  Laterality: N/A;   PORTACATH PLACEMENT Left 07/23/2020   Procedure: INSERTION PORT-A-CATH;  Surgeon: Ebbie Cough, MD;  Location: [facility];  Service: General;  Laterality: Left;   THYROID  LOBECTOMY Right 05/2010   @[facility]   VAGINAL HYSTERECTOMY  03/21/2019   @ [facility] by dr odie;   w/ Jannetta culdoplasty   Patient Active Problem List   Diagnosis Date Noted   Iron  deficiency anemia 06/29/2023   Osteopenia 12/31/2022   Prolapse of posterior vaginal wall 01/26/2022   Uterine prolapse 03/20/2021   Polyarthralgia 03/20/2021   Port-A-Cath in place 02/06/2021   Genetic testing 07/17/2020   Malignant neoplasm of upper-outer quadrant of right breast in female, estrogen receptor positive (HCC) 07/12/2020   Change in bowel habits 06/12/2016   Mixed hyperlipidemia 01/07/2016   Hypothyroidism, unspecified 01/07/2016   Allergic rhinitis 04/02/2015   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    PCP: Toribio Jerel MATSU, MD  REFERRING PROVIDER: Zuleta, Kaitlin G, NP  REFERRING DIAG: [phone] (ICD-10-CM) - Post-operative state  THERAPY DIAG:  Muscle weakness (generalized)  Abnormal posture  Unspecified lack of coordination  Rationale for Evaluation and Treatment: Rehabilitation  ONSET DATE: noticed at last pelvic exam in [place]  SUBJECTIVE:                                                                                                                                                                                           SUBJECTIVE STATEMENT: Reports no worsening pressure after session last visit; this morning and yesterday reports she has felt a little more pressure than usual but unsure why other than being busy. Pain with intercourse/wand use about the same 1/10  PAIN:  Are you having pain? No - at this time.  1/10 at area at vaginal opening with intercourse   PRECAUTIONS: Other: breast CA - not currently getting treatments   WEIGHT BEARING RESTRICTIONS: No  FALLS:  Has patient fallen in last 6 months? No  LIVING ENVIRONMENT: Lives with: lives with their  family   OCCUPATION: PA  PLOF: Independent  PATIENT GOALS: to have no pain with sex  PERTINENT HISTORY:   Malignant neoplasm of upper-outer quadrant of right breast in female, estrogen receptor positive (HCC)  Surgery: s/p Posterior repair, sacrospinous ligament  fixation, perineorrhaphy, anal sphincteroplasty on 01/26/22 S/p EUA and revision perineoplasty on 02/21/22.  Sexual abuse: No  BOWEL MOVEMENT: Pain with bowel movement: No Type of bowel movement: normal for pt Fully empty rectum: Yes:   Leakage: No Pads: No Fiber supplement: No  URINATION: Pain with urination: No Fully empty bladder: Yes:   Stream: Strong Urgency: No Frequency: not quicker than every 2 hours Leakage: none Pads: No  INTERCOURSE: Pain with intercourse: Initial Penetration and During Penetration Ability to have vaginal penetration:  Yes: but painful Climax: not really due to pain Marinoff Scale: 2/3  PREGNANCY: Vaginal deliveries 2 Tearing Yes: 3rd deg tear posterior with first and anterior second  C-section deliveries 0 Currently pregnant No  PROLAPSE: None   OBJECTIVE:   DIAGNOSTIC FINDINGS:    COGNITION: Overall cognitive status: Within functional limits for tasks assessed     SENSATION: Light touch: Appears intact Proprioception: Appears intact  MUSCLE LENGTH: Hamstrings and adductors limited by 25%    POSTURE: rounded shoulders, forward head, and flexed trunk   LUMBARAROM/PROM:  A/PROM A/PROM  eval  Flexion Limited by 25%  Extension WFL  Right lateral flexion WFL  Left lateral flexion WFL  Right rotation WFL  Left rotation WFL   (Blank rows = not tested)  LOWER EXTREMITY ROM:  WFL  LOWER EXTREMITY MMT:  Hip abduction 3+/5 bil, all other 4/5, knees 5/5 PALPATION:   General  no TTP externally                 External Perineal Exam mild TTP at posterior vaginal opening externally 10/08/22 - TTP at bil labia minora however reports she needs to add her  cream externally to help with tissue mobility and moisture.                              Internal Pelvic Floor bil bulbocavernosus, at vaginal opening at clock face 8-4 locations, no TTP at deep layers except near coccyx. perineal scar tissue restriction/tenderness,  10/08/22 - Lt bulbocavernosus, tension most felt at 4-5 on clock face of vaginal opening, also TTP throughout Lt pubococcygeus possible puborectalis with trigger points noted. Improving perineal scar mobility with mild restrictions now.   Patient confirms identification and approves PT to assess internal pelvic floor and treatment Yes  PELVIC MMT:   MMT eval 10/08/22  11/26/22  04/29/23  06/28/23   Vaginal 3/5, 8s, 10 reps 3/5, 10s, 10 reps - however focus of training has been improved pain management with decreased tension 4/5 5reps 8s 4/5, 10s, 7 reps 4/5, 8s, 8 reps  Internal Anal Sphincter       External Anal Sphincter       Puborectalis       Diastasis Recti       (Blank rows = not tested)        TONE: Pelvic floor atrophy   PROLAPSE: Not seen in hooklying  11/12/22 - anterior wall laxity with cough in hooklying and standing.   TODAY'S TREATMENT:  DATE: 07/08/23  All exercises cued for exhale and pelvic floor coordination for decreased strain at pelvic floor/prolapse  Hooklying marching yellow loop 2x10 Hooklying hip abduction yellow loop 2x10 2x10 bridges Seated bil shoulder horizontal abduction green band 2x10 Squats 10# 2x10 Bird dogs 2x10 Farmer's carry 10# 750' each hand  08/04/23 All exercises cued for exhale and pelvic floor coordination for decreased strain at pelvic floor/prolapse  Vibration plate 1 min standing, 2x1 min sitting low setting Pelvic drops on yoga ball - pt able to feel relaxation from contraction but denies bulge x10 Pelvic tilts sitting on ball x10, circles x10  bil No TTP with externally at obturators  2x10 bridges 7#  Opp hand/knee ball press 2x10 Bent rows 7# bil 2x10 Mario punches 7# x10  Farmer's carry 7#+10#    08/11/23 Pelvic propping 5 mins with cues for diaphragmatic breathing and 2x10 pelvic floor contractions in propped position; x10 10s isometrics In pelvic propped position: blue band 2x10 hip abduction +exhale +pelvic floor contraction, 2x10 blue band hip flexion alt  2x10 bridges- unable to do single leg bridges properly due to strength deficits Quad blue band fire hydrants 2x10 Quad donkey kicks blue band 2x10 10# squats x15 X10 kickstand single leg Sit to stand  2x10 7# cable pallof X10 7# rotational pallof Farmer's carry 10# 1000' each hand    PATIENT EDUCATION:  Education details: [id number] Person educated: Patient Education method: Explanation, Demonstration, Tactile cues, Verbal cues, and Handouts Education comprehension: verbalized understanding and returned demonstration  HOME EXERCISE PROGRAM: [id number]    ASSESSMENT:  CLINICAL IMPRESSION: Patient presents for treatment today. Pt session focused on pelvic floor coordination and pressure management with strengthening exercises to decreased prolapse symptoms and improve pelvic floor strength. Pt tolerated session well denied any increase in symptoms, reports everything completed in pelvic propped position had improved prolapse symptoms and easier for pelvic floor contractions. Pt did report kegels in standing were still much harder to achieve but can feel them and does help decreased prolapse symptoms. Pt tolerated well. Pt would benefit from additional PT to further address deficits.    OBJECTIVE IMPAIRMENTS: decreased coordination, decreased endurance, decreased mobility, decreased strength, increased fascial restrictions, increased muscle spasms, impaired flexibility, improper body mechanics, postural dysfunction, and pain.   ACTIVITY LIMITATIONS: sitting and  intercourse, medical assessments with internal needs  PARTICIPATION LIMITATIONS: interpersonal relationship and community activity  PERSONAL FACTORS: 1 comorbidity: medical history are also affecting patient's functional outcome.   REHAB POTENTIAL: Good  CLINICAL DECISION MAKING: Stable/uncomplicated  EVALUATION COMPLEXITY: Low   GOALS: Goals reviewed with patient? Yes  SHORT TERM GOALS: Target date: 08/21/22  Pt to be I with advanced HEP.  Baseline: Goal status: MET  2.  Pt will report no more than 5/10 pain due to improvements in posture, strength, and muscle length  Baseline:  Goal status: MET   LONG TERM GOALS: Target date: 10/23/22  Pt to be I with advanced HEP.  Baseline:  Goal status: MET 2.  Pt will report no more than 2/10 pain due to improvements in posture, strength, and muscle length  Baseline:  Goal status: MET  3.  Pt to report improved tolerance to vaginal penetration for improved ability to be able to climax at least 75% of the instance of intercourse.  Baseline:  Goal status: on going  4.  Pt to be I with manual self stretching of perineal body/vaginal opening to improve tissue mobility and decrease pain at home.  Baseline:  Goal status: MET  5.  Pt to demonstrate at least 4/5 pelvic floor strength with ability to fully relax post contraction for improved pelvic stability and decreased strain at pelvic floor/ decrease leakage.  Baseline:  Goal status: MET  6.  Pt to demonstrate at least 5/5 bil hip strength with good coordination of pelvic floor and breathing  for improved pelvic stability and functional squats without leakage.   Baseline:  Goal status: MET  7.  Pt to demonstrate pelvic floor contraction of at least 4/5 strength for 10s and be able to withhold during cough in standing to decreased strain at pelvic floor. Baseline:  Goal status: MET  8. Pt to demonstrate improved coordination of pelvic floor and breathing mechanics with 10# squat  with appropriate synergistic patterns to decrease pain and leakage at least 75% of the time.      Baseline:  Goal status: MET  9. Pt to report no increase in pressure at vaginal area with at least 45 mins of activity due to improved I with pressure management, pelvic floor and core and hip strengthening to decreased strain at prolapse and promotes decreased risk of tissue re-injury.  Baseline:  Goal status: NEW    PT FREQUENCY: 1-2x/week  PT DURATION: 10 sessions  PLANNED INTERVENTIONS: Therapeutic exercises, Therapeutic activity, Neuromuscular re-education, Balance training, Gait training, Patient/Family education, Self Care, Aquatic Therapy, Dry Needling, Spinal mobilization, Cryotherapy, Moist heat, scar mobilization, Taping, Biofeedback, and Manual therapy  PLAN FOR NEXT SESSION: manual work at perineal mobility, core and hip strengthening, coordination of pelvic floor and breathing mechanics   Darryle Navy, PT, DPT 04/30/258:48 AM   PHYSICAL THERAPY DISCHARGE SUMMARY  Visits from Start of Care: 26  Current functional level related to goals / functional outcomes: Unable to reassess as pt unable to return since last visit    Remaining deficits: Unable to reassess as pt unable to return since last visit    Education / Equipment: HEP   Patient agrees to discharge. Patient goals were partially met. Patient is being discharged due to not returning since the last visit.  Darryle Navy, PT, DPT 12/22/202510:50 AM  [facility] [address] [place], [place] [address] Phone # [phone] Fax [phone]  "

## 2023-08-23 ENCOUNTER — Ambulatory Visit: Payer: Self-pay | Admitting: Physical Therapy

## 2023-08-23 ENCOUNTER — Ambulatory Visit (INDEPENDENT_AMBULATORY_CARE_PROVIDER_SITE_OTHER): Admitting: Obstetrics and Gynecology

## 2023-08-23 ENCOUNTER — Encounter: Payer: Self-pay | Admitting: Obstetrics and Gynecology

## 2023-08-23 VITALS — BP 127/84 | HR 79

## 2023-08-23 DIAGNOSIS — N8110 Cystocele, unspecified: Secondary | ICD-10-CM | POA: Diagnosis not present

## 2023-08-23 DIAGNOSIS — R102 Pelvic and perineal pain: Secondary | ICD-10-CM | POA: Diagnosis not present

## 2023-08-23 NOTE — Progress Notes (Signed)
 [facility] Return Visit  SUBJECTIVE  History of Present Illness: Sandy Goodwin is a 53 y.o. female seen in follow-up for perineal pain and levator spasm. Finished a round of levator/ perineal trigger point injections. She does feel this helped some but still continues to have pain with intercourse. She continues to see pelvic PT.  She is using vaginal estrogen and  good clean love with lidocaine  lubricant.   Noted cystocele last year (without apical decent). She is noticing the bulge more of the time now (about 80% of the time). Having more leakage right after urinating. She will stand and notice a few more drips. Prior urodynamics in 2023 did not demonstrate SUI.   She may be interested in surgery later in the year but wants to consider a pessary. She has a #4 RWS pessary that she has used previously- she brought it with her today.   Prior surgery: s/p Posterior repair, sacrospinous ligament fixation, perineorrhaphy, anal sphincteroplasty on 01/26/22 S/p EUA and revision perineoplasty on 02/21/22.   Past Medical History: Patient  has a past medical history of Full incontinence-feces, History of anemia, History of cancer chemotherapy, History of external beam radiation therapy, History of gastric ulcer, Hypothyroidism, postsurgical, Malignant neoplasm of upper-outer quadrant of right breast in female, estrogen receptor positive (HCC) (06/2020), Polyarthralgia, PONV (postoperative nausea and vomiting), Prolapse of vaginal vault after hysterectomy, RA (rheumatoid arthritis) (HCC), and Tachycardia.   Past Surgical History: She  has a past surgical history that includes Thyroid  lobectomy (Right, 05/2010); Appendectomy (08/2006); Colonoscopy (N/A, 06/15/2016); Vaginal hysterectomy (03/21/2019); Portacath placement (Left, 07/23/2020); Breast lumpectomy with radioactive seed and sentinel lymph node biopsy (Right, 12/11/2020); Breast reconstruction (Right, 12/20/2020); Breast reduction surgery  (Left, 12/20/2020); Incision and drainage of wound (Right, 01/10/2021); Dilation and curettage of uterus (06/03/2011); Ovum / oocyte retrieval; Excision of breast biopsy (Right, 1999); Anterior and posterior repair with sacrospinous fixation (N/A, 01/26/2022); Perineoplasty (N/A, 01/26/2022); Perineoplasty (N/A, 02/19/2022); and Breast biopsy (Right, 07/30/2022).   Medications: She has a current medication list which includes the following prescription(s): acetaminophen , anastrozole , baclofen, bystolic, cetirizine, clobetasol ointment, estradiol , gabapentin , levothyroxine, lidocaine , metronidazole , pantoprazole, polyethylene glycol 3350 , triamcinolone , and wegovy, and the following Facility-Administered Medications: bupivacaine , bupivacaine , iopamidol , triamcinolone  acetonide, and triamcinolone  acetonide.   Allergies: Patient is allergic to codeine, hyaluronic acid [sodium hyaluronate (non-avian)], hydrocodone, other, latex, and wound dressing adhesive.   Social History: Patient  reports that she has never smoked. She has never used smokeless tobacco. She reports that she does not currently use alcohol. She reports that she does not use drugs.     OBJECTIVE     Physical Exam: Vitals:   08/23/23 1529  BP: 127/84  Pulse: 79   Gen: No apparent distress, A&O x 3.  Detailed Urogynecologic Evaluation:  Normal external genitalia. Patient's #4 RWS Pessary placed without difficulty. It bulges slightly to the opening but stays in place with valsalva. Feels comfortable for the patient.    ASSESSMENT AND PLAN    Sandy Goodwin is a 53 y.o. with:  1. Prolapse of anterior vaginal wall   2. Perineal pain     - Will try #4 RWS pessary for now. Could go down to #3 RWS if she notices it protruding some.  - We discussed repeating urodynamic testing since she is considering prolapse surgery. She is agreeable to this.  - Due to isolated anterior prolapse and pelvic pain, would recommend anterior repair. She is  considering surgery later this year (November) so will plan to repeat  POP-q a few months before to confirm exam has not changed. We discussed the option of repair with mesh but we discussed the possible risk of pain with mesh which could confound her current symptoms. She is in agreement and would like to avoid mesh if possible.  - For perineal pain, she has some amitriptyline/ gabapentin / baclofen compounded cream at home that was previously prescribed. Advised her to use it on the perineum for at least 2 weeks. If it is helpful can continue nightly for about a month, then decrease to 2-3 times per week or as needed. She will also continue with perineal massage and pelvic PT.   Return for urodynamics   Arma Lamp, MD

## 2023-08-23 NOTE — Patient Instructions (Signed)

## 2023-09-15 ENCOUNTER — Ambulatory Visit (INDEPENDENT_AMBULATORY_CARE_PROVIDER_SITE_OTHER): Admitting: Obstetrics and Gynecology

## 2023-09-15 ENCOUNTER — Encounter: Payer: Self-pay | Admitting: Obstetrics and Gynecology

## 2023-09-15 VITALS — BP 126/82 | HR 67

## 2023-09-15 DIAGNOSIS — N393 Stress incontinence (female) (male): Secondary | ICD-10-CM

## 2023-09-15 DIAGNOSIS — N8110 Cystocele, unspecified: Secondary | ICD-10-CM

## 2023-09-15 DIAGNOSIS — R350 Frequency of micturition: Secondary | ICD-10-CM

## 2023-09-15 DIAGNOSIS — N3946 Mixed incontinence: Secondary | ICD-10-CM

## 2023-09-15 DIAGNOSIS — N3281 Overactive bladder: Secondary | ICD-10-CM

## 2023-09-15 DIAGNOSIS — N898 Other specified noninflammatory disorders of vagina: Secondary | ICD-10-CM

## 2023-09-15 LAB — POCT URINALYSIS DIPSTICK
Bilirubin, UA: NEGATIVE
Blood, UA: NEGATIVE
Glucose, UA: NEGATIVE
Ketones, UA: NEGATIVE
Leukocytes, UA: NEGATIVE
Nitrite, UA: NEGATIVE
Protein, UA: NEGATIVE
Spec Grav, UA: 1.01 (ref 1.010–1.025)
Urobilinogen, UA: 0.2 U/dL
pH, UA: 7.5 (ref 5.0–8.0)

## 2023-09-15 MED ORDER — LIDOCAINE 5 % EX OINT: TOPICAL_OINTMENT | Freq: Every day | CUTANEOUS | 5 refills | Status: AC | PRN

## 2023-09-15 NOTE — Progress Notes (Signed)
 [facility] Urodynamics Procedure  Referring Physician: Leesa Pulling, MD Date of Procedure: 09/15/2023  [name] is a 53 y.o. female who presents for urodynamic evaluation. Indication(s) for study: SUI  Vital Signs: BP 126/82 (BP Location: Left Arm, Patient Position: Sitting, Cuff Size: Normal)   Pulse 67   Laboratory Results: A catheterized urine specimen revealed:  POC urine:  Lab Results  Component Value Date   COLORU yellow 12/09/2021   CLARITYU clear 12/09/2021   GLUCOSEUR Negative 12/09/2021   BILIRUBINUR neg 12/09/2021   KETONESU neg 12/09/2021   SPECGRAV 1.010 12/09/2021   RBCUR neg 12/09/2021   PHUR 6.0 12/09/2021   PROTEINUR Negative 12/09/2021   UROBILINOGEN 0.2 12/09/2021   LEUKOCYTESUR Negative 12/09/2021     Voiding Diary: Deferred  Procedure Timeout:  The correct patient was verified and the correct procedure was verified. The patient was in the correct position and safety precautions were reviewed based on at the patient's history.  Urodynamic Procedure A 167F dual lumen urodynamics catheter was placed under sterile conditions into the patient's bladder. A 167F catheter was placed into the rectum in order to measure abdominal pressure. EMG patches were placed in the appropriate position.  All connections were confirmed and calibrations/adjusted made. Saline was instilled into the bladder through the dual lumen catheters.  Cough/valsalva pressures were measured periodically during filling.  Patient was allowed to void.  The bladder was then emptied of its residual.  UROFLOW: Revealed a Qmax of 19 mL/sec.  She voided 141 mL and had a residual of 60 mL.  It was a normal pattern and represented normal habits though interpretation limited due to low voided volume.  CMG: This was performed with sterile water  in the sitting position at a fill rate of 20-30 mL/min.    First sensation of fullness was 107 mLs,  First urge was 124 mLs,  Strong urge  was 145 mLs and  Capacity was 786 mLs  Stress incontinence was not demonstrated Highest negative Barrier CLPP was 113 cmH20 at 379 ml. Highest negative Barrier VLPP was 84 cmH20 at 379 ml.  Detrusor function was overactive, with phasic contractions seen.  The first occurred at 87 mL to 5 cm of water  and was not associated with urge.  Compliance:  Normal. End fill detrusor pressure was 0cmH20.    UPP: MUCP with barrier reduction was 65 cm of water .    MICTURITION STUDY: Voiding was performed with reduction using scopettes in the sitting position.  Pdet at Qmax was 23 cm of water .  Qmax was 18 mL/sec.  It was a prolonged normal pattern.  She voided 686 mL and had a residual of 100 mL.  It was a volitional void, sustained detrusor contraction was present and abdominal straining was present at the beginning of the void and then no longer present.   EMG: This was performed with patches.  She had voluntary contractions, recruitment with fill was present and urethral sphincter was relaxed with void.  The details of the procedure with the study tracings have been scanned into EPIC.   Urodynamic Impression:  1. Sensation was normal; capacity was increased 2. Stress Incontinence was not demonstrated at normal pressures; 3. Detrusor Overactivity was demonstrated without leakage. 4. Emptying was normal with a normal PVR, a sustained detrusor contraction present,  abdominal straining present only at beginning of void, normal urethral sphincter activity on EMG.  Plan: - The patient will follow up  to discuss the findings and treatment options.

## 2023-09-15 NOTE — Addendum Note (Signed)
 Addended by: Graciela Lava on: 09/15/2023 11:35 AM   Modules accepted: Orders

## 2023-09-28 ENCOUNTER — Other Ambulatory Visit: Payer: Self-pay | Admitting: Obstetrics and Gynecology

## 2023-09-28 DIAGNOSIS — N952 Postmenopausal atrophic vaginitis: Secondary | ICD-10-CM

## 2023-10-07 ENCOUNTER — Other Ambulatory Visit: Payer: Self-pay | Admitting: Hematology and Oncology

## 2023-10-12 ENCOUNTER — Encounter: Payer: Self-pay | Admitting: Hematology and Oncology

## 2023-10-18 ENCOUNTER — Ambulatory Visit (INDEPENDENT_AMBULATORY_CARE_PROVIDER_SITE_OTHER): Admitting: Obstetrics and Gynecology

## 2023-10-18 ENCOUNTER — Encounter: Payer: Self-pay | Admitting: Obstetrics and Gynecology

## 2023-10-18 VITALS — BP 122/83 | HR 68

## 2023-10-18 DIAGNOSIS — N993 Prolapse of vaginal vault after hysterectomy: Secondary | ICD-10-CM

## 2023-10-18 NOTE — Progress Notes (Signed)
 [facility] Return Visit  SUBJECTIVE  History of Present Illness: Sandy Goodwin is a 53 y.o. female seen in follow-up for prolapse and incontinence. She reports that her perineum has been feeling a lot better since the last round of trigger point injections.   Urodynamic Impression:  1. Sensation was normal; capacity was increased 2. Stress Incontinence was not demonstrated at normal pressures; 3. Detrusor Overactivity was demonstrated without leakage. 4. Emptying was normal with a normal PVR, a sustained detrusor contraction present,  abdominal straining present only at beginning of void, normal urethral sphincter activity on EMG.   s/p Posterior repair, sacrospinous ligament fixation, perineorrhaphy, anal sphincteroplasty on 01/26/22 S/p EUA and revision perineoplasty on 02/21/22.   Past Medical History: Patient  has a past medical history of Full incontinence-feces, History of anemia, History of cancer chemotherapy, History of external beam radiation therapy, History of gastric ulcer, Hypothyroidism, postsurgical, Malignant neoplasm of upper-outer quadrant of right breast in female, estrogen receptor positive (HCC) (06/2020), Polyarthralgia, PONV (postoperative nausea and vomiting), Prolapse of vaginal vault after hysterectomy, RA (rheumatoid arthritis) (HCC), and Tachycardia.   Past Surgical History: She  has a past surgical history that includes Thyroid  lobectomy (Right, 05/2010); Appendectomy (08/2006); Colonoscopy (N/A, 06/15/2016); Vaginal hysterectomy (03/21/2019); Portacath placement (Left, 07/23/2020); Breast lumpectomy with radioactive seed and sentinel lymph node biopsy (Right, 12/11/2020); Breast reconstruction (Right, 12/20/2020); Breast reduction surgery (Left, 12/20/2020); Incision and drainage of wound (Right, 01/10/2021); Dilation and curettage of uterus (06/03/2011); Ovum / oocyte retrieval; Excision of breast biopsy (Right, 1999); Anterior and posterior repair with  sacrospinous fixation (N/A, 01/26/2022); Perineoplasty (N/A, 01/26/2022); Perineoplasty (N/A, 02/19/2022); and Breast biopsy (Right, 07/30/2022).   Medications: She has a current medication list which includes the following prescription(s): acetaminophen , anastrozole , baclofen, bystolic, cetirizine, clobetasol ointment, estradiol , gabapentin , levothyroxine, lidocaine , metronidazole , pantoprazole, polyethylene glycol 3350 , triamcinolone , and wegovy, and the following Facility-Administered Medications: bupivacaine , bupivacaine , iopamidol , triamcinolone  acetonide, and triamcinolone  acetonide.   Allergies: Patient is allergic to codeine, hyaluronic acid [sodium hyaluronate (non-avian)], hydrocodone, other, latex, and wound dressing adhesive.   Social History: Patient  reports that she has never smoked. She has never used smokeless tobacco. She reports that she does not currently use alcohol. She reports that she does not use drugs.     OBJECTIVE     Physical Exam: Vitals:   10/18/23 1436  BP: 122/83  Pulse: 68   Gen: No apparent distress, A&O x 3.  Detailed Urogynecologic Evaluation:  Normal external genitalia. On speculum, normal vaginal mucosa. On bimanual, no masses present.   POP-Q  0                                            Aa   0                                           Ba  -5.5                                              C   3  Gh  5                                            Pb  6.5                                            tvl   -3                                            Ap  -3                                            Bp                                                 D       ASSESSMENT AND PLAN    Ms. Gibeault is a 53 y.o. with:  1. Vaginal vault prolapse after hysterectomy     - We discussed two options for prolapse repair:  1) vaginal repair without mesh - Pros - safer, no mesh complications - Cons -  not as strong as mesh repair, higher risk of recurrence  2) laparoscopic repair with mesh - Pros - stronger, better long-term success - Cons - risks of mesh implant (erosion into vagina or bladder, adhering to the rectum, pain) - these risks are lower than with a vaginal mesh but still exist - Since she has had recurrent prolapse (two prior surgeries), we discussed that it is reasonable to do a repair with mesh, however this comes with more risks. She has a history of pelvic abscess and bowel injury as well as possible prior endometriosis. We discussed that if there is too much scar tissue present, then we may not be able to place mesh.  - She is considering between robotic sacrocolpopexy, and anterior repair with possible sacrospinous fixation.   - We reviewed the patient's specific anatomic and functional findings, with the assistance of diagrams, and together finalized the above procedure. The planned surgical procedures were discussed along with the surgical risks outlined below, which were also provided on a detailed handout. Additional treatment options including expectant management, conservative management, medical management were discussed where appropriate.  We reviewed the benefits and risks of each treatment option.    Prolapse (with or without mesh): Risk factors for surgical failure  include things that put pressure on your pelvis and the surgical repair, including obesity, chronic cough, and heavy lifting or straining (including lifting children or adults, straining on the toilet, or lifting heavy objects such as furniture or anything weighing >25 lbs. Risks of recurrence is 20-30% with vaginal native tissue repair and a less than 10% with sacrocolpopexy with mesh.    Sacrocolpopexy: Mesh implants may provide more prolapse support, but do have some unique risks to consider. It is important to understand that mesh is permanent and cannot be easily removed. Risks  of abdominal  sacrocolpopexy mesh include mesh exposure (~3-6%), painful intercourse (recent studies show lower rates after surgery compared to before, with ~5-8% risk of new onset), and very rare risks of bowel or bladder injury or infection (<1%). The risk of mesh exposure is more likely in a woman with risks for poor healing (prior radiation, poorly controlled diabetes, or immunocompromised). The risk of new or worsened chronic pain after mesh implant is more common in women with baseline chronic pain and/or poorly controlled anxiety or depression. There is an FDA safety notification on vaginal mesh procedures for prolapse but NOT abdominal mesh procedures and therefore does not apply to your surgery. We have extensive experience and training with mesh placement and we have close postoperative follow up to identify any potential complications from mesh.    - She will let us  know how she wants to proceed for surgery. Wants surgery November 2026.  - Medical clearance: not required  - Anticoagulant use: No - Medicaid Hysterectomy form: n/a - Accepts blood transfusion: Yes - Expected length of stay: outpatient  Rosaline LOISE Caper, MD

## 2023-11-05 ENCOUNTER — Encounter: Payer: Self-pay | Admitting: Hematology and Oncology

## 2023-11-22 ENCOUNTER — Ambulatory Visit: Attending: General Surgery

## 2023-11-22 VITALS — Wt 183.5 lb

## 2023-11-22 DIAGNOSIS — Z483 Aftercare following surgery for neoplasm: Secondary | ICD-10-CM | POA: Insufficient documentation

## 2023-11-22 NOTE — Therapy (Signed)
 OUTPATIENT PHYSICAL THERAPY SOZO SCREENING NOTE   Patient Name: Sandy Goodwin MRN: [id number] DOB:1970/07/10, 53 y.o., female Today's Date: 11/22/2023  PCP: Toribio Jerel MATSU, MD REFERRING PROVIDER: Ebbie Cough, MD   PT End of Session - 11/22/23 1601     Visit Number 22   # unchanged due to screen only   PT Start Time 1559    PT Stop Time 1603    PT Time Calculation (min) 4 min    Activity Tolerance Patient tolerated treatment well    Behavior During Therapy [facility] for tasks assessed/performed          Past Medical History:  Diagnosis Date   Full incontinence-feces    History of anemia    History of cancer chemotherapy    right breast;   neo-adjuvent 07-24-2020  to 11-15-2020  and 12-05-2020  to 07-24-2021   History of external beam radiation therapy    right reast 02-04-2021  to 03-25-2021   History of gastric ulcer    remote hx -- resolved   Hypothyroidism, postsurgical    followed by pcp;     02/ 2012  s/p  right hemithyroidectomy  (follicular adenoma)   Malignant neoplasm of upper-outer quadrant of right breast in female, estrogen receptor positive (HCC) 06/2020   oncologist--- dr odean;  DCIS high grade right breast , neo-adjuvant chemo completed 11-15-2020,  s/p right breast lumpectomy w/ node dissection on 12-11-2020, then chemo completed 07-24-2021 and radiation completed 03-25-2021   Polyarthralgia    PONV (postoperative nausea and vomiting)    With first surgery, none since   Prolapse of vaginal vault after hysterectomy    and posterior prolapse   RA (rheumatoid arthritis) (HCC)    rheumatologsit--- dr mai;  no treatment   Tachycardia    Tachycardia   Past Surgical History:  Procedure Laterality Date   ANTERIOR AND POSTERIOR REPAIR WITH SACROSPINOUS FIXATION N/A 01/26/2022   Procedure: POSTERIOR REPAIR WITH SACROSPINOUS FIXATION;  Surgeon: Marilynne Rosaline SAILOR, MD;  Location: [facility];  Service: Gynecology;  Laterality: N/A;  total  time requested is 2 hours   APPENDECTOMY  08/2006   exploratory laparatomy w/ appendectomy (ruptured) and repair bowel   BREAST BIOPSY Right 07/30/2022   MM RT BREAST BX W LOC DEV 1ST LESION IMAGE BX SPEC STEREO GUIDE 07/30/2022 [facility]   BREAST LUMPECTOMY WITH RADIOACTIVE SEED AND SENTINEL LYMPH NODE BIOPSY Right 12/11/2020   Procedure: RIGHT BREAST LUMPECTOMY WITH RADIOACTIVE SEED X2 AND RIGHT AXILLARY SENTINEL LYMPH NODE BIOPSY;  Surgeon: Ebbie Cough, MD;  Location: [facility];  Service: General;  Laterality: Right;   BREAST RECONSTRUCTION Right 12/20/2020   Procedure: RIGHT ONCOPLASTIC RECONSTRUCTION;  Surgeon: Arelia Filippo, MD;  Location: [facility];  Service: Plastics;  Laterality: Right;   BREAST REDUCTION SURGERY Left 12/20/2020   Procedure: LEFT BREAST REDUCTION;  Surgeon: Arelia Filippo, MD;  Location: [facility];  Service: Plastics;  Laterality: Left;   COLONOSCOPY N/A 06/15/2016   Procedure: COLONOSCOPY;  Surgeon: Claudis RAYMOND Rivet, MD;  [facility];  Service: Endoscopy;  Laterality: N/A;  845   DILATION AND CURETTAGE OF UTERUS  06/03/2011   w/ suction for miscarriage   EXCISION OF BREAST BIOPSY Right 1999   INCISION AND DRAINAGE OF WOUND Right 01/10/2021   Procedure: INCISION AND DRAINAGE OF RIGHT AXILLA;  Surgeon: Arelia Filippo, MD;  Location: [facility];  Service: Plastics;  Laterality: Right;   OVUM / OOCYTE  RETRIEVAL     2010  and 2012   PERINEOPLASTY N/A 01/26/2022   Procedure: PERINEORRHAPY;  Surgeon: Marilynne Rosaline SAILOR, MD;  Location: [facility];  Service: Gynecology;  Laterality: N/A;   PERINEOPLASTY N/A 02/19/2022   Procedure: PERINEOPLASTY revision;  Surgeon: Marilynne Rosaline SAILOR, MD;  Location: [facility];  Service: Gynecology;  Laterality: N/A;   PORTACATH PLACEMENT Left 07/23/2020   Procedure: INSERTION PORT-A-CATH;  Surgeon:  Ebbie Cough, MD;  Location: [facility];  Service: General;  Laterality: Left;   THYROID  LOBECTOMY Right 05/2010   @[facility]   VAGINAL HYSTERECTOMY  03/21/2019   @ [facility] by dr odie;   w/ Jannetta culdoplasty   Patient Active Problem List   Diagnosis Date Noted   Iron  deficiency anemia 06/29/2023   Osteopenia 12/31/2022   Prolapse of posterior vaginal wall 01/26/2022   Uterine prolapse 03/20/2021   Polyarthralgia 03/20/2021   Port-A-Cath in place 02/06/2021   Genetic testing 07/17/2020   Malignant neoplasm of upper-outer quadrant of right breast in female, estrogen receptor positive (HCC) 07/12/2020   Change in bowel habits 06/12/2016   Mixed hyperlipidemia 01/07/2016   Hypothyroidism, unspecified 01/07/2016   Allergic rhinitis 04/02/2015   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   PRECAUTIONS: right UE Lymphedema risk, None  SUBJECTIVE: Pt returns for her 6 month L-Dex screen.   PAIN:  Are you having pain? No  SOZO SCREENING: Patient was assessed today using the SOZO machine to determine the lymphedema index score. This was compared to her baseline score. It was determined that she is within the recommended range when compared to her baseline and no further action is needed at this time. She will continue SOZO screenings. These are done every 3 months for 2 years post operatively followed by every 6 months for 2 years, and then annually.   L-DEX FLOWSHEETS - 11/22/23 1600       L-DEX LYMPHEDEMA SCREENING   Measurement  Type Unilateral    L-DEX MEASUREMENT EXTREMITY Upper Extremity    POSITION  Standing    DOMINANT SIDE Right    At Risk Side Right    BASELINE SCORE (UNILATERAL) -3    L-DEX SCORE (UNILATERAL) -2.7    VALUE CHANGE (UNILAT) 0.3            Aden Berwyn Caldron, PTA 11/22/2023, 4:02 PM

## 2023-11-30 ENCOUNTER — Encounter: Payer: Self-pay | Admitting: Hematology and Oncology

## 2023-12-27 ENCOUNTER — Other Ambulatory Visit: Payer: Self-pay | Admitting: Hematology and Oncology

## 2023-12-27 ENCOUNTER — Other Ambulatory Visit: Payer: Self-pay | Admitting: *Deleted

## 2023-12-27 DIAGNOSIS — Z170 Estrogen receptor positive status [ER+]: Secondary | ICD-10-CM

## 2023-12-27 DIAGNOSIS — D509 Iron deficiency anemia, unspecified: Secondary | ICD-10-CM

## 2023-12-29 ENCOUNTER — Encounter: Payer: Self-pay | Admitting: Hematology and Oncology

## 2024-01-03 ENCOUNTER — Other Ambulatory Visit

## 2024-01-03 ENCOUNTER — Inpatient Hospital Stay: Payer: BC Managed Care – PPO | Attending: Hematology and Oncology | Admitting: Hematology and Oncology

## 2024-01-03 ENCOUNTER — Inpatient Hospital Stay

## 2024-01-03 VITALS — BP 122/60 | HR 71 | Temp 97.4°F | Resp 18 | Ht 66.0 in | Wt 180.4 lb

## 2024-01-03 DIAGNOSIS — D509 Iron deficiency anemia, unspecified: Secondary | ICD-10-CM | POA: Diagnosis not present

## 2024-01-03 DIAGNOSIS — M8588 Other specified disorders of bone density and structure, other site: Secondary | ICD-10-CM

## 2024-01-03 DIAGNOSIS — M549 Dorsalgia, unspecified: Secondary | ICD-10-CM | POA: Diagnosis not present

## 2024-01-03 DIAGNOSIS — E7800 Pure hypercholesterolemia, unspecified: Secondary | ICD-10-CM | POA: Insufficient documentation

## 2024-01-03 DIAGNOSIS — Z79811 Long term (current) use of aromatase inhibitors: Secondary | ICD-10-CM | POA: Diagnosis not present

## 2024-01-03 DIAGNOSIS — R252 Cramp and spasm: Secondary | ICD-10-CM | POA: Insufficient documentation

## 2024-01-03 DIAGNOSIS — M8580 Other specified disorders of bone density and structure, unspecified site: Secondary | ICD-10-CM | POA: Insufficient documentation

## 2024-01-03 DIAGNOSIS — C50411 Malignant neoplasm of upper-outer quadrant of right female breast: Secondary | ICD-10-CM | POA: Diagnosis present

## 2024-01-03 DIAGNOSIS — Z170 Estrogen receptor positive status [ER+]: Secondary | ICD-10-CM | POA: Insufficient documentation

## 2024-01-03 MED ORDER — SODIUM CHLORIDE 0.9 % IV SOLN: INTRAVENOUS | Status: DC

## 2024-01-03 MED ORDER — ZOLEDRONIC ACID 4 MG/100ML IV SOLN
4.0000 mg | Freq: Once | INTRAVENOUS | Status: AC
  Administered 2024-01-03: 4 mg via INTRAVENOUS
  Filled 2024-01-03: qty 100

## 2024-01-03 NOTE — Progress Notes (Signed)
 Patient Care Team: Toribio Jerel MATSU, MD as PCP - General (Family Medicine) Ebbie Cough, MD as Consulting Physician (General Surgery) Odean Potts, MD as Consulting Physician (Hematology and Oncology) Dewey Rush, MD as Consulting Physician (Radiation Oncology)  DIAGNOSIS:  Encounter Diagnoses  Name Primary?   Malignant neoplasm of upper-outer quadrant of right breast in female, estrogen receptor positive (HCC) Yes   Iron  deficiency anemia, unspecified iron  deficiency anemia type     SUMMARY OF ONCOLOGIC HISTORY: Oncology History  Malignant neoplasm of upper-outer quadrant of right breast in female, estrogen receptor positive (HCC)  03/23/2018 Genetic Testing   [employer] Genetic Testing Results: Negative, with a variant of uncertain significance in POLE.  Genes Analyzed: APC, ATM, AXIN2, BARD1, BMPR1A, BRCA1, BRCA2, BRIP1, CDH1, CDK4, CDKN2A, CHEK2, EPCAM (large rearrangement only), HOXB13 (sequencing only), GALNT12, MLH1, MSH2, MSH3, MSH6, MUTYH, NBN, NTHL1, PALB2, PMS2, PTEN, RAD51C, RAD51D, RNF43, RPS20, SMAD4, STK11, TP52. Sequencing was performed for select regions of POLE and POLD1, and large rearrangement analysis was performed for select regions of GERM1.   07/08/2020 Initial Diagnosis   Screening mammogram showed right breast calcifications. Diagnostic mammogram showed right breast calcifications spanning 4.1cm and no abnormal right axillary lymph nodes. 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   07/24/2020 - 11/15/2020 Neo-Adjuvant Chemotherapy   Taxotere , Carbo, Herceptin , Perjeta  given every three weeks x 6   12/05/2020 - 07/24/2021 Chemotherapy   Maintenance Herceptin /Perjeta  every three weeks for one year       12/11/2020 Surgery   Right breast lumpectomy Viktoria): Scattered foci of high-grade DCIS with calcifications, no invasive cancer, pathologic complete response, margins negative,  0/3 lymph nodes negative, previously ER 80%, PR 40%, HER2 positive, Ki-67 25%   12/11/2020 Cancer Staging   Staging form: Breast, AJCC 8th Edition - Pathologic stage from 12/11/2020: No Stage Recommended (ypT0, pN0, cM0) - Signed by Crawford Morna Pickle, NP on 03/19/2021 Stage prefix: Post-therapy   12/20/2020 Surgery   Reconstruction with Dr. Arelia   02/04/2021 - 03/25/2021 Radiation Therapy   Adjuvant radiation   06/2021 -  Anti-estrogen oral therapy   Anastrozole  daily     CHIEF COMPLIANT: Follow-up on anastrozole  therapy and Zometa   HISTORY OF PRESENT ILLNESS: History of Present Illness Sandy Goodwin is a 53 year old female with breast cancer who presents for follow-up after surgery and to discuss her current treatment regimen.  She is recovering well from surgery on August 25th and engages in low-intensity cycling at home. Her energy levels have improved significantly since her last iron  infusion in April. Leg cramps have improved but still occur occasionally.  Her last blood work showed a hemoglobin level of 15.1, iron  saturation of 25%, and ferritin level of 172. She is currently on Zometa  and has started taking Lipitor. She has been on anastrozole  for over two and a half years, which may be contributing to her cholesterol rise.  She experiences some back pain, described as an ache, but it does not interfere with her activities. She has lost eight pounds and uses resistance bands to maintain muscle mass.     ALLERGIES:  is allergic to codeine, hyaluronic acid [sodium hyaluronate (non-avian)], hydrocodone, other, latex, and wound dressing adhesive.  MEDICATIONS:  Current Outpatient Medications  Medication Sig Dispense Refill   acetaminophen  (TYLENOL ) 500 MG tablet Take 1 tablet (500 mg total) by mouth every 6 (six) hours as needed (pain). (Patient taking differently: Take 500 mg  by mouth every 6 (six) hours as needed (pain). For after surgery) 30 tablet 0   anastrozole   (ARIMIDEX ) 1 MG tablet TAKE ONE TABLET BY MOUTH EVERY DAY 90 tablet 3   baclofen (LIORESAL) 20 MG tablet Take 10 mg by mouth daily.     BYSTOLIC 5 MG tablet Take 1 tablet by mouth at bedtime.  1   cetirizine (ZYRTEC) 10 MG tablet Take 10 mg by mouth at bedtime.     clobetasol ointment (TEMOVATE) 0.05 % Apply 1 Application topically 2 (two) times daily as needed.     estradiol  (ESTRACE ) 0.1 MG/GM vaginal cream INSERT 0.5GM VAGINALLY NIGHTLY FOR TWO WEEKS THEN USE TWICE A WEEK THEREAFTER 42.5 g 0   gabapentin  (NEURONTIN ) 100 MG capsule Take 1 capsule (100 mg total) by mouth 3 (three) times daily. 90 capsule 1   levothyroxine (SYNTHROID, LEVOTHROID) 50 MCG tablet Take 50 mcg by mouth at bedtime.     lidocaine  (XYLOCAINE ) 5 % ointment Apply topically daily as needed. 35 g 5   metroNIDAZOLE  (METROCREAM ) 0.75 % cream Apply 1 application  topically 2 (two) times daily as needed.     pantoprazole (PROTONIX) 40 MG tablet Take 40 mg by mouth at bedtime.     Polyethylene Glycol 3350  (MIRALAX  PO) Take by mouth daily as needed.     triamcinolone  (NASACORT ) 55 MCG/ACT AERO nasal inhaler Place 2 sprays into the nose daily as needed. Pt uses during allergy season     Current Facility-Administered Medications  Medication Dose Route Frequency Provider Last Rate Last Admin   bupivacaine  (MARCAINE ) 0.25 % (with pres) injection 9 mL  9 mL Infiltration Once        bupivacaine  (MARCAINE ) 0.25 % (with pres) injection 9 mL  9 mL Infiltration Once        iopamidol  (ISOVUE -370) 76 % injection 100 mL  100 mL Intravenous Once PRN Dezyre Hoefer, MD       triamcinolone  acetonide (KENALOG -40) injection 40 mg  40 mg Intramuscular Once        triamcinolone  acetonide (KENALOG -40) injection 40 mg  40 mg Intramuscular Once        Facility-Administered Medications Ordered in Other Visits  Medication Dose Route Frequency Provider Last Rate Last Admin   0.9 %  sodium chloride  infusion   Intravenous Continuous Odean Potts, MD    Stopped at 01/03/24 1210    PHYSICAL EXAMINATION: ECOG PERFORMANCE STATUS: 1 - Symptomatic but completely ambulatory  Vitals:   01/03/24 1046  BP: 122/60  Pulse: 71  Resp: 18  Temp: (!) 97.4 F (36.3 C)  SpO2: 100%   Filed Weights   01/03/24 1046  Weight: 180 lb 6.4 oz (81.8 kg)    Physical Exam   (exam performed in the presence of a chaperone)  LABORATORY DATA:  I have reviewed the data as listed    Latest Ref Rng & Units 07/24/2021    9:09 AM 07/03/2021    9:01 AM 06/12/2021    9:01 AM  CMP  Glucose 70 - 99 mg/dL 892  893  897   BUN 6 - 20 mg/dL 24  17  19    Creatinine 0.44 - 1.00 mg/dL 9.12  9.29  9.22   Sodium 135 - 145 mmol/L 141  140  140   Potassium 3.5 - 5.1 mmol/L 4.4  4.1  4.0   Chloride 98 - 111 mmol/L 109  106  106   CO2 22 - 32 mmol/L 26  29  29  Calcium 8.9 - 10.3 mg/dL 8.5  8.9  9.0   Total Protein 6.5 - 8.1 g/dL 6.6  7.0  7.2   Total Bilirubin 0.3 - 1.2 mg/dL 0.2  0.3  0.4   Alkaline Phos 38 - 126 U/L 107  111  115   AST 15 - 41 U/L 19  17  18    ALT 0 - 44 U/L 16  17  19      Lab Results  Component Value Date   WBC 4.7 02/19/2022   HGB 10.2 (L) 02/19/2022   HCT 33.4 (L) 02/19/2022   MCV 91.0 02/19/2022   PLT 376 02/19/2022   NEUTROABS 4.7 07/24/2021    ASSESSMENT & PLAN:  Malignant neoplasm of upper-outer quadrant of right breast in female, estrogen receptor positive (HCC) 07/08/2020: Screening mammogram showed right breast calcifications. Diagnostic mammogram showed right breast calcifications spanning 4.1cm and no abnormal right axillary lymph nodes. 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 Genetics done in 2019: Negative   Treatment plan: 1. Neoadjuvant chemotherapy with TCH Perjeta  6 cycles completed 11/15/2020 followed by Herceptin  Perjeta  maintenance for 1 year 2. 12/11/2020:Right breast lumpectomy Viktoria): Scattered foci of high-grade DCIS with  calcifications, no invasive cancer, pathologic complete response, margins negative, 0/3 lymph nodes negative, previously ER 80%, PR 40%, HER2 positive, Ki-67 25% 3. Followed by adjuvant radiation therapy  started 02/04/2021 4.  Followed by adjuvant antiestrogen therapy switched back to anastrozole  from tamoxifen . 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 URCC nausea study ---------------------------------------------------------------------------------------------------------------------------------------   Breast Cancer Surveillance: 1. Breast MRI: 12/28/2022 benign, breast density category C 2. DEXA scan. 12/28/2022: T score -2.4: Recommended bisphosphonate therapy.  I recommend Zometa  once a year starting next week.  Labs were done by her primary care physician on 12/29/2022 which showed normal creatinine 0.73 and normal calcium 9.2.   3. Mammograms: 01/15/2023 right breast: Benign breast density category C, bilateral mammograms to be done April 2025 4.  Signatera for MRD monitoring: Negative 5.  Breast MRI 12/28/2022: Benign breast density category C   Lab review:  06/22/2023: Hemoglobin 13.6, MCV 90, iron  saturation 9%, TIBC 427, calcium 9.3, creatinine 0.64, Ferritin 15 2018 Colonoscopy: Normal IV iron : April 2025 (some muscle aches and pains improved dramatically) 12/22/2023: Hemoglobin 15.1, MCV 93, iron  saturation 25%, ferritin 172, B12 498, folate 9.5, creatinine 0.77, calcium 9.1 (proceed with Zometa  infusion)     Breast cancer surveillance:  Contrast-enhanced mammogram is been ordered but apparently she has not been scheduled because of logistical issues at the breast center. 07/22/2023: Benign. breast density category C Check iron  levels in 6 months Assessment & Plan Estrogen receptor positive malignant neoplasm of upper-outer quadrant of right breast Recovering well post-surgery. Anastrozole  contributing to elevated cholesterol. Back pain  likely due to anastrozole  or stiffness, not recurrence. - Continue anastrozole  therapy. - Administer Zometa  for cancer prevention and bone density improvement. - Plan for circulating tumor DNA testing ([facility]) for early detection of recurrence. - Schedule bone density test next year. - Monitor cholesterol levels and adjust Lipitor dosage as tolerated.  Iron  deficiency anemia Iron  levels improved significantly since last infusion. Hemoglobin at 15.1, iron  saturation at 25%. Leg cramps improved with iron  supplementation, though she persists mildly. - Check iron  levels in six months. - Consider magnesium supplementation if leg cramps persist. - Monitor for increased leg cramps and report if they worsen.  No orders of the defined types were placed in this encounter.  The patient has a good understanding of the overall plan. she agrees with it. she will call with any problems that may develop before the next visit here. Total time spent: 30 mins including face to face time and time spent for planning, charting and co-ordination of care   Naomi MARLA Chad, MD 01/03/24

## 2024-01-03 NOTE — Patient Instructions (Signed)

## 2024-01-03 NOTE — Progress Notes (Signed)
 External labs SCr =0.77 Ca=9.1

## 2024-01-03 NOTE — Assessment & Plan Note (Signed)
 07/08/2020: Screening mammogram showed right breast calcifications. Diagnostic mammogram showed right breast calcifications spanning 4.1cm and no abnormal right axillary lymph nodes. 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 Genetics done in 2019: Negative   Treatment plan: 1. Neoadjuvant chemotherapy with TCH Perjeta  6 cycles completed 11/15/2020 followed by Herceptin  Perjeta  maintenance for 1 year 2. 12/11/2020:Right breast lumpectomy Sandy Goodwin): Scattered foci of high-grade DCIS with calcifications, no invasive cancer, pathologic complete response, margins negative, 0/3 lymph nodes negative, previously ER 80%, PR 40%, HER2 positive, Ki-67 25% 3. Followed by adjuvant radiation therapy  started 02/04/2021 4.  Followed by adjuvant antiestrogen therapy switched back to anastrozole  from tamoxifen . 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 URCC nausea study ---------------------------------------------------------------------------------------------------------------------------------------   Breast Cancer Surveillance: 1. Breast MRI: 12/28/2022 benign, breast density category C 2. DEXA scan. 12/28/2022: T score -2.4: Recommended bisphosphonate therapy.  I recommend Zometa  once a year starting next week.  Labs were done by her primary care physician on 12/29/2022 which showed normal creatinine 0.73 and normal calcium 9.2.   3. Mammograms: 01/15/2023 right breast: Benign breast density category C, bilateral mammograms to be done April 2025 4.  Signatera for MRD monitoring: Negative 5.  Breast MRI 12/28/2022: Benign breast density category C   Lab review:  06/22/2023: Hemoglobin 13.6, MCV 90, iron  saturation 9%, TIBC 427, calcium 9.3, creatinine 0.64, Ferritin 15 2018 Colonoscopy: Normal IV iron : April 2025   Our plan is to check labs in 3 months and follow-up after that.  If she has  labs to be drawn for other reasons we will combine them to the other labs that she needs to be drawn.    Breast cancer surveillance:  Contrast-enhanced mammogram is been ordered but apparently she has not been scheduled because of logistical issues at the breast center. 07/22/2023: Benign. breast density category C

## 2024-01-04 ENCOUNTER — Encounter: Payer: Self-pay | Admitting: Family Medicine

## 2024-01-12 LAB — SIGNATERA
SIGNATERA MTM READOUT: 0 MTM/ml
SIGNATERA TEST RESULT: NEGATIVE

## 2024-03-30 ENCOUNTER — Other Ambulatory Visit: Payer: Self-pay | Admitting: *Deleted

## 2024-03-30 DIAGNOSIS — Z170 Estrogen receptor positive status [ER+]: Secondary | ICD-10-CM

## 2024-04-04 ENCOUNTER — Encounter: Payer: Self-pay | Admitting: Family Medicine

## 2024-05-22 ENCOUNTER — Ambulatory Visit

## 2024-07-05 ENCOUNTER — Inpatient Hospital Stay: Admitting: Hematology and Oncology

## 2025-01-01 ENCOUNTER — Inpatient Hospital Stay

## 2025-01-01 ENCOUNTER — Inpatient Hospital Stay: Admitting: Hematology and Oncology
